# Patient Record
Sex: Female | Born: 1985 | Race: Black or African American | Hispanic: No | Marital: Single | Smoking: Never smoker
Health system: Southern US, Community
[De-identification: ages and names within clinical notes are randomized; demographics above are authoritative.]

## PROBLEM LIST (undated history)

## (undated) DIAGNOSIS — M549 Dorsalgia, unspecified: Secondary | ICD-10-CM

## (undated) DIAGNOSIS — N730 Acute parametritis and pelvic cellulitis: Secondary | ICD-10-CM

## (undated) DIAGNOSIS — K589 Irritable bowel syndrome without diarrhea: Secondary | ICD-10-CM

## (undated) DIAGNOSIS — K5900 Constipation, unspecified: Secondary | ICD-10-CM

## (undated) DIAGNOSIS — Z91018 Allergy to other foods: Secondary | ICD-10-CM

## (undated) DIAGNOSIS — N809 Endometriosis, unspecified: Secondary | ICD-10-CM

## (undated) DIAGNOSIS — J45909 Unspecified asthma, uncomplicated: Secondary | ICD-10-CM

## (undated) DIAGNOSIS — D649 Anemia, unspecified: Secondary | ICD-10-CM

## (undated) HISTORY — DX: Irritable bowel syndrome, unspecified: K58.9

## (undated) HISTORY — DX: Unspecified asthma, uncomplicated: J45.909

## (undated) HISTORY — DX: Anemia, unspecified: D64.9

## (undated) HISTORY — DX: Constipation, unspecified: K59.00

## (undated) HISTORY — DX: Endometriosis, unspecified: N80.9

## (undated) HISTORY — DX: Allergy to other foods: Z91.018

## (undated) HISTORY — DX: Dorsalgia, unspecified: M54.9

---
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
## 2011-02-17 ENCOUNTER — Ambulatory Visit: Payer: Self-pay | Admitting: Women's Health

## 2014-10-16 ENCOUNTER — Encounter (HOSPITAL_COMMUNITY): Payer: Self-pay | Admitting: Emergency Medicine

## 2014-10-16 ENCOUNTER — Emergency Department (INDEPENDENT_AMBULATORY_CARE_PROVIDER_SITE_OTHER)
Admission: EM | Admit: 2014-10-16 | Discharge: 2014-10-16 | Disposition: A | Discharge status: Home / Self Care | Payer: Self-pay | Source: Home / Self Care | Attending: Emergency Medicine | Admitting: Emergency Medicine

## 2014-10-16 ENCOUNTER — Emergency Department (INDEPENDENT_AMBULATORY_CARE_PROVIDER_SITE_OTHER): Payer: Self-pay

## 2014-10-16 DIAGNOSIS — S139XXA Sprain of joints and ligaments of unspecified parts of neck, initial encounter: Secondary | ICD-10-CM

## 2014-10-16 DIAGNOSIS — S239XXA Sprain of unspecified parts of thorax, initial encounter: Secondary | ICD-10-CM

## 2014-10-16 DIAGNOSIS — S63502A Unspecified sprain of left wrist, initial encounter: Secondary | ICD-10-CM

## 2014-10-16 MED ORDER — HYDROCODONE-ACETAMINOPHEN 5-325 MG PO TABS: ORAL_TABLET | ORAL | Status: DC

## 2014-10-16 MED ORDER — DICLOFENAC SODIUM 75 MG PO TBEC: 75.0000 mg | DELAYED_RELEASE_TABLET | Freq: Two times a day (BID) | ORAL | Status: DC

## 2014-10-16 MED ORDER — CYCLOBENZAPRINE HCL 5 MG PO TABS: 5.0000 mg | ORAL_TABLET | Freq: Three times a day (TID) | ORAL | Status: DC | PRN
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
## 2014-10-16 NOTE — Discharge Instructions (Signed)
Do exercises twice daily followed by moist heat for 15 minutes.      Try to be as active as possible.  If no better in 2 weeks, follow up with orthopedist.  TREATMENT  Treatment initially involves the use of ice and medication to help reduce pain and inflammation. It is also important to perform strengthening and stretching exercises and modify activities that worsen symptoms so the injury does not get worse. These exercises may be performed at home or with a therapist. For patients who experience severe symptoms, a soft padded collar may be recommended to be worn around the neck.  Improving your posture may help reduce symptoms. Posture improvement includes pulling your chin and abdomen in while sitting or standing. If you are sitting, sit in a firm chair with your buttocks against the back of the chair. While sleeping, try replacing your pillow with a small towel rolled to 2 inches in diameter, or use a cervical pillow. Poor sleeping positions delay healing.   MEDICATION   If pain medication is necessary, nonsteroidal anti-inflammatory medications, such as aspirin and ibuprofen, or other minor pain relievers, such as acetaminophen, are often recommended.  Do not take pain medication for 7 days before surgery.  Prescription pain relievers may be given if deemed necessary by your caregiver. Use only as directed and only as much as you need.  HEAT AND COLD:   Cold treatment (icing) relieves pain and reduces inflammation. Cold treatment should be applied for 10 to 15 minutes every 2 to 3 hours for inflammation and pain and immediately after any activity that aggravates your symptoms. Use ice packs or an ice massage.  Heat treatment may be used prior to performing the stretching and strengthening activities prescribed by your caregiver, physical therapist, or athletic trainer. Use a heat pack or a warm soak.  SEEK MEDICAL CARE IF:   Symptoms get worse or do not improve in 2 weeks despite  treatment.  New, unexplained symptoms develop (drugs used in treatment may produce side effects).  EXERCISES RANGE OF MOTION (ROM) AND STRETCHING EXERCISES - Cervical Strain and Sprain These exercises may help you when beginning to rehabilitate your injury. In order to successfully resolve your symptoms, you must improve your posture. These exercises are designed to help reduce the forward-head and rounded-shoulder posture which contributes to this condition. Your symptoms may resolve with or without further involvement from your physician, physical therapist or athletic trainer. While completing these exercises, remember:   Restoring tissue flexibility helps normal motion to return to the joints. This allows healthier, less painful movement and activity.  An effective stretch should be held for at least 20 seconds, although you may need to begin with shorter hold times for comfort.  A stretch should never be painful. You should only feel a gentle lengthening or release in the stretched tissue.  STRETCH- Axial Extensors  Lie on your back on the floor. You may bend your knees for comfort. Place a rolled up hand towel or dish towel, about 2 inches in diameter, under the part of your head that makes contact with the floor.  Gently tuck your chin, as if trying to make a "double chin," until you feel a gentle stretch at the base of your head.  Hold _____10_____ seconds. Repeat _____10_____ times. Complete this exercise _____2_____ times per day.   STRETECH - Axial Extension   Stand or sit on a firm surface. Assume a good posture: chest up, shoulders drawn back, abdominal muscles slightly tense,  knees unlocked (if standing) and feet hip width apart.  Slowly retract your chin so your head slides back and your chin slightly lowers.Continue to look straight ahead.  You should feel a gentle stretch in the back of your head. Be certain not to feel an aggressive stretch since this can cause  headaches later.  Hold for ____10______ seconds. Repeat _____10_____ times. Complete this exercise ____2______ times per day.  STRETCH  Cervical Side Bend   Stand or sit on a firm surface. Assume a good posture: chest up, shoulders drawn back, abdominal muscles slightly tense, knees unlocked (if standing) and feet hip width apart.  Without letting your nose or shoulders move, slowly tip your right / left ear to your shoulder until your feel a gentle stretch in the muscles on the opposite side of your neck.  Hold _____10_____ seconds. Repeat _____10_____ times. Complete this exercise _____2_____ times per day.  STRETCH  Cervical Rotators   Stand or sit on a firm surface. Assume a good posture: chest up, shoulders drawn back, abdominal muscles slightly tense, knees unlocked (if standing) and feet hip width apart.  Keeping your eyes level with the ground, slowly turn your head until you feel a gentle stretch along the back and opposite side of your neck.  Hold _____10_____ seconds. Repeat ____10______ times. Complete this exercise ____2______ times per day.  RANGE OF MOTION - Neck Circles   Stand or sit on a firm surface. Assume a good posture: chest up, shoulders drawn back, abdominal muscles slightly tense, knees unlocked (if standing) and feet hip width apart.  Gently roll your head down and around from the back of one shoulder to the back of the other. The motion should never be forced or painful.  Repeat the motion 10-20 times, or until you feel the neck muscles relax and loosen. Repeat ____10______ times. Complete the exercise _____2_____ times per day.  STRENGTHENING EXERCISES - Cervical Strain and Sprain These exercises may help you when beginning to rehabilitate your injury. They may resolve your symptoms with or without further involvement from your physician, physical therapist or athletic trainer. While completing these exercises, remember:   Muscles can gain both the  endurance and the strength needed for everyday activities through controlled exercises.  Complete these exercises as instructed by your physician, physical therapist or athletic trainer. Progress the resistance and repetitions only as guided.  You may experience muscle soreness or fatigue, but the pain or discomfort you are trying to eliminate should never worsen during these exercises. If this pain does worsen, stop and make certain you are following the directions exactly. If the pain is still present after adjustments, discontinue the exercise until you can discuss the trouble with your clinician.  STRENGTH Cervical Flexors, Isometric  Face a wall, standing about 6 inches away. Place a small pillow, a ball about 6-8 inches in diameter, or a folded towel between your forehead and the wall.  Slightly tuck your chin and gently push your forehead into the soft object. Push only with mild to moderate intensity, building up tension gradually. Keep your jaw and forehead relaxed.  Hold 10 to 20 seconds. Keep your breathing relaxed.  Release the tension slowly. Relax your neck muscles completely before you start the next repetition. Repeat _____10_____ times. Complete this exercise _____2_____ times per day.  STRENGTH- Cervical Lateral Flexors, Isometric   Stand about 6 inches away from a wall. Place a small pillow, a ball about 6-8 inches in diameter, or a folded towel  between the side of your head and the wall.  Slightly tuck your chin and gently tilt your head into the soft object. Push only with mild to moderate intensity, building up tension gradually. Keep your jaw and forehead relaxed.  Hold 10 to 20 seconds. Keep your breathing relaxed.  Release the tension slowly. Relax your neck muscles completely before you start the next repetition. Repeat _____10_____ times. Complete this exercise ____2______ times per day.  STRENGTH  Cervical Extensors, Isometric   Stand about 6 inches away from  a wall. Place a small pillow, a ball about 6-8 inches in diameter, or a folded towel between the back of your head and the wall.  Slightly tuck your chin and gently tilt your head back into the soft object. Push only with mild to moderate intensity, building up tension gradually. Keep your jaw and forehead relaxed.  Hold 10 to 20 seconds. Keep your breathing relaxed.  Release the tension slowly. Relax your neck muscles completely before you start the next repetition. Repeat _____10_____ times. Complete this exercise _____2_____ times per day.  POSTURE AND BODY MECHANICS CONSIDERATIONS - Cervical Strain and Sprain Keeping correct posture when sitting, standing or completing your activities will reduce the stress put on different body tissues, allowing injured tissues a chance to heal and limiting painful experiences. The following are general guidelines for improved posture. Your physician or physical therapist will provide you with any instructions specific to your needs. While reading these guidelines, remember:  The exercises prescribed by your provider will help you have the flexibility and strength to maintain correct postures.  The correct posture provides the optimal environment for your joints to work. All of your joints have less wear and tear when properly supported by a spine with good posture. This means you will experience a healthier, less painful body.  Correct posture must be practiced with all of your activities, especially prolonged sitting and standing. Correct posture is as important when doing repetitive low-stress activities (typing) as it is when doing a single heavy-load activity (lifting). PROLONGED STANDING WHILE SLIGHTLY LEANING FORWARD When completing a task that requires you to lean forward while standing in one place for a long time, place either foot up on a stationary 2-4 inch high object to help maintain the best posture. When both feet are on the ground, the low  back tends to lose its slight inward curve. If this curve flattens (or becomes too large), then the back and your other joints will experience too much stress, fatigue more quickly and can cause pain.  RESTING POSITIONS Consider which positions are most painful for you when choosing a resting position. If you have pain with flexion-based activities (sitting, bending, stooping, squatting), choose a position that allows you to rest in a less flexed posture. You would want to avoid curling into a fetal position on your side. If your pain worsens with extension-based activities (prolonged standing, working overhead), avoid resting in an extended position such as sleeping on your stomach. Most people will find more comfort when they rest with their spine in a more neutral position, neither too rounded nor too arched. Lying on a non-sagging bed on your side with a pillow between your knees, or on your back with a pillow under your knees will often provide some relief. Keep in mind, being in any one position for a prolonged period of time, no matter how correct your posture, can still lead to stiffness. WALKING Walk with an upright posture. Your ears, shoulders  and hips should all line-up. OFFICE WORK When working at a desk, create an environment that supports good, upright posture. Without extra support, muscles fatigue and lead to excessive strain on joints and other tissues. CHAIR:  A chair should be able to slide under your desk when your back makes contact with the back of the chair. This allows you to work closely.  The chair's height should allow your eyes to be level with the upper part of your monitor and your hands to be slightly lower than your elbows.  Body position:  Your feet should make contact with the floor. If this is not possible, use a foot rest.  Keep your ears over your shoulders. This will reduce stress on your neck and low back. Document Released: 10/06/2005 Document Revised:  12/29/2011 Document Reviewed: 01/18/2009 [facility] Patient Information 2013 [place].
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
## 2017-07-23 ENCOUNTER — Other Ambulatory Visit: Payer: Self-pay | Admitting: Gastroenterology

## 2017-07-23 DIAGNOSIS — R1032 Left lower quadrant pain: Secondary | ICD-10-CM

## 2017-07-23 NOTE — Progress Notes (Signed)
Gwendolyn Codispoti MD 

## 2017-07-24 ENCOUNTER — Other Ambulatory Visit: Payer: Self-pay

## 2018-07-30 ENCOUNTER — Ambulatory Visit (INDEPENDENT_AMBULATORY_CARE_PROVIDER_SITE_OTHER): Payer: BC Managed Care – PPO

## 2018-07-30 ENCOUNTER — Encounter (HOSPITAL_COMMUNITY): Payer: Self-pay | Admitting: Emergency Medicine

## 2018-07-30 ENCOUNTER — Ambulatory Visit (HOSPITAL_COMMUNITY)
Admission: EM | Admit: 2018-07-30 | Discharge: 2018-07-30 | Disposition: A | Discharge status: Home / Self Care | Payer: BC Managed Care – PPO | Attending: Family Medicine | Admitting: Family Medicine

## 2018-07-30 DIAGNOSIS — S93492A Sprain of other ligament of left ankle, initial encounter: Secondary | ICD-10-CM | POA: Diagnosis not present

## 2018-07-30 DIAGNOSIS — W1842XA Slipping, tripping and stumbling without falling due to stepping into hole or opening, initial encounter: Secondary | ICD-10-CM

## 2018-07-30 DIAGNOSIS — R109 Unspecified abdominal pain: Secondary | ICD-10-CM

## 2018-07-30 DIAGNOSIS — G8929 Other chronic pain: Secondary | ICD-10-CM

## 2018-07-30 DIAGNOSIS — M25572 Pain in left ankle and joints of left foot: Secondary | ICD-10-CM

## 2018-07-30 DIAGNOSIS — M799 Soft tissue disorder, unspecified: Secondary | ICD-10-CM

## 2018-07-30 LAB — POCT URINALYSIS DIP (DEVICE)
BILIRUBIN URINE: NEGATIVE
GLUCOSE, UA: NEGATIVE mg/dL
Hgb urine dipstick: NEGATIVE
KETONES UR: NEGATIVE mg/dL
LEUKOCYTES UA: NEGATIVE
Nitrite: NEGATIVE
PH: 6 (ref 5.0–8.0)
Protein, ur: NEGATIVE mg/dL
Specific Gravity, Urine: >=1.03 (ref 1.005–1.030)
Urobilinogen, UA: 0.2 mg/dL (ref 0.0–1.0)

## 2018-07-30 MED ORDER — DICLOFENAC SODIUM 75 MG PO TBEC: 75.0000 mg | DELAYED_RELEASE_TABLET | Freq: Two times a day (BID) | ORAL | 0 refills | Status: DC

## 2018-07-30 NOTE — ED Triage Notes (Signed)
Pt states she was walking at work and stepped in a pot hole and twisted her L ankle. Pt limping. Pt also feelsl ike she has "a kidney thing" on her R flank.

## 2018-07-30 NOTE — Discharge Instructions (Addendum)
Partial weightbearing tomorrow and advance as tolerated  Follow-up with occupational health at [address] across from the [facility] next Tuesday, no appointment necessary  I think you have had an upper work-up of the flank pain.  It appears to be a muscle strain and may take a few more months to clear up completely.

## 2018-07-30 NOTE — ED Provider Notes (Signed)
[place]    CSN: [id number] Arrival date & time: 07/30/18  6295     History   Chief Complaint Chief Complaint  Patient presents with  . Ankle Pain    HPI Gwendolyn Nguyen is a 32 y.o. female.   Pt states she was walking at work and stepped in a pot hole and twisted her L ankle. Pt limping. Pt also feels like she has "a kidney thing" on her R flank.  She is able to bear some weight but needs to limp quite strikingly.  She has had intermittent sharp pains in her right flank for over a year.  She seen the gynecologist, the chiropractor, and the gastroenterologist said none of them were unable to diagnose anything.      History reviewed. No pertinent past medical history.  There are no active problems to display for this patient.   History reviewed. No pertinent surgical history.  OB History   None      Home Medications    Prior to Admission medications   Medication Sig Start Date End Date Taking? Authorizing Provider  diclofenac (VOLTAREN) 75 MG EC tablet Take 1 tablet (75 mg total) by mouth 2 (two) times daily. 07/30/18   Robyn Haber, MD    Family History No family history on file.  Social History Social History   Tobacco Use  . Smoking status: Never Smoker  Substance Use Topics  . Alcohol use: Yes    Comment: occassional  . Drug use: Not on file     Allergies   Shellfish allergy and Sulfur   Review of Systems Review of Systems  Genitourinary: Positive for flank pain.  Musculoskeletal: Positive for gait problem and joint swelling.  All other systems reviewed and are negative.    Physical Exam Triage Vital Signs ED Triage Vitals  Enc Vitals Group     BP 07/30/18 1851 134/75     Pulse Rate 07/30/18 1851 85     Resp 07/30/18 1851 16     Temp 07/30/18 1851 98.2 F (36.8 C)     Temp src --      SpO2 07/30/18 1851 100 %     Weight --      Height --      Head Circumference --      Peak Flow --      Pain Score  07/30/18 1852 7     Pain Loc --      Pain Edu? --      Excl. in [place]? --    No data found.  Updated Vital Signs BP 134/75   Pulse 85   Temp 98.2 F (36.8 C)   Resp 16   LMP 07/16/2018   SpO2 100%    Physical Exam  Constitutional: She appears well-developed and well-nourished.  HENT:  Right Ear: External ear normal.  Left Ear: External ear normal.  Mouth/Throat: Oropharynx is clear and moist.  Eyes: Conjunctivae are normal.  Neck: Normal range of motion.  Pulmonary/Chest: Effort normal.  Abdominal: Soft.  Patient has some point tenderness underneath the 12th rib on the right  Musculoskeletal:  Antalgic gait  Mild swelling lateral malleolus of left ankle  Tender distal fibula on the left  Skin: Skin is warm and dry.  Psychiatric: She has a normal mood and affect.  Nursing note and vitals reviewed.    UC Treatments / Results  Labs (all labs ordered are listed, but only abnormal results are displayed) Labs Reviewed  POCT  URINALYSIS DIP (DEVICE)    EKG None  Radiology Dg Ankle Complete Left  Result Date: 07/30/2018 CLINICAL DATA:  Initial evaluation for acute injury, twisted ankle. EXAM: LEFT ANKLE COMPLETE - 3+ VIEW COMPARISON:  None. FINDINGS: No acute fracture dislocation. Ankle mortise approximated. Talar dome intact. Diffuse soft tissue swelling about the ankle, most notable at the lateral malleolus. IMPRESSION: 1. No acute osseous abnormality. 2. Diffuse soft tissue swelling about the ankle. Electronically Signed   By: Jeannine Boga M.D.   On: 07/30/2018 19:33    Procedures Procedures (including critical care time)  Medications Ordered in UC Medications - No data to display  Initial Impression / Assessment and Plan / UC Course  I have reviewed the triage vital signs and the nursing notes.  Pertinent labs & imaging results that were available during my care of the patient were reviewed by me and considered in my medical decision making (see chart  for details).     Final Clinical Impressions(s) / UC Diagnoses   Final diagnoses:  Sprain of anterior talofibular ligament of left ankle, initial encounter  Flank pain, chronic     Discharge Instructions     Partial weightbearing tomorrow and advance as tolerated  Follow-up with occupational health at [address] across from the [facility] next Tuesday, no appointment necessary  I think you have had an upper work-up of the flank pain.  It appears to be a muscle strain and may take a few more months to clear up completely.    ED Prescriptions    Medication Sig Dispense Auth. Provider   diclofenac (VOLTAREN) 75 MG EC tablet Take 1 tablet (75 mg total) by mouth 2 (two) times daily. 14 tablet Robyn Haber, MD     Controlled Substance Prescriptions [place] Controlled Substance Registry consulted? Not Applicable   Robyn Haber, MD 07/30/18 1949

## 2018-09-09 ENCOUNTER — Emergency Department (HOSPITAL_COMMUNITY): Payer: BC Managed Care – PPO

## 2018-09-09 ENCOUNTER — Other Ambulatory Visit: Payer: Self-pay

## 2018-09-09 ENCOUNTER — Encounter (HOSPITAL_COMMUNITY): Payer: Self-pay | Admitting: Emergency Medicine

## 2018-09-09 ENCOUNTER — Emergency Department (HOSPITAL_COMMUNITY)
Admission: EM | Admit: 2018-09-09 | Discharge: 2018-09-09 | Disposition: A | Discharge status: Home / Self Care | Payer: BC Managed Care – PPO | Attending: Emergency Medicine | Admitting: Emergency Medicine

## 2018-09-09 DIAGNOSIS — R1030 Lower abdominal pain, unspecified: Secondary | ICD-10-CM | POA: Diagnosis not present

## 2018-09-09 DIAGNOSIS — Z79899 Other long term (current) drug therapy: Secondary | ICD-10-CM | POA: Insufficient documentation

## 2018-09-09 LAB — COMPREHENSIVE METABOLIC PANEL
ALT: 10 U/L (ref 0–44)
AST: 14 U/L — AB (ref 15–41)
Albumin: 3.9 g/dL (ref 3.5–5.0)
Alkaline Phosphatase: 64 U/L (ref 38–126)
Anion gap: 8 (ref 5–15)
BUN: 11 mg/dL (ref 6–20)
CHLORIDE: 106 mmol/L (ref 98–111)
CO2: 24 mmol/L (ref 22–32)
Calcium: 8.9 mg/dL (ref 8.9–10.3)
Creatinine, Ser: 1.01 mg/dL — ABNORMAL HIGH (ref 0.44–1.00)
GFR calc non Af Amer: >60 mL/min (ref >60)
Glucose, Bld: 80 mg/dL (ref 70–99)
Potassium: 3.6 mmol/L (ref 3.5–5.1)
Sodium: 138 mmol/L (ref 135–145)
Total Bilirubin: 0.4 mg/dL (ref 0.3–1.2)
Total Protein: 7.7 g/dL (ref 6.5–8.1)

## 2018-09-09 LAB — CBC WITH DIFFERENTIAL/PLATELET
Abs Immature Granulocytes: 0.02 10*3/uL (ref 0.00–0.07)
Basophils Absolute: 0.1 10*3/uL (ref 0.0–0.1)
Basophils Relative: 1 %
Eosinophils Absolute: 0.3 10*3/uL (ref 0.0–0.5)
Eosinophils Relative: 3 %
HCT: 29.1 % — ABNORMAL LOW (ref 36.0–46.0)
Hemoglobin: 8.1 g/dL — ABNORMAL LOW (ref 12.0–15.0)
Immature Granulocytes: 0 %
Lymphocytes Relative: 24 %
Lymphs Abs: 1.9 10*3/uL (ref 0.7–4.0)
MCH: 19.9 pg — ABNORMAL LOW (ref 26.0–34.0)
MCHC: 27.8 g/dL — ABNORMAL LOW (ref 30.0–36.0)
MCV: 71.3 fL — ABNORMAL LOW (ref 80.0–100.0)
MONOS PCT: 7 %
Monocytes Absolute: 0.5 10*3/uL (ref 0.1–1.0)
NRBC: 0 % (ref 0.0–0.2)
Neutro Abs: 5.2 10*3/uL (ref 1.7–7.7)
Neutrophils Relative %: 65 %
PLATELETS: 324 10*3/uL (ref 150–400)
RBC: 4.08 MIL/uL (ref 3.87–5.11)
RDW: 18.4 % — AB (ref 11.5–15.5)
WBC: 8 10*3/uL (ref 4.0–10.5)

## 2018-09-09 LAB — URINALYSIS, ROUTINE W REFLEX MICROSCOPIC
BILIRUBIN URINE: NEGATIVE
GLUCOSE, UA: NEGATIVE mg/dL
KETONES UR: NEGATIVE mg/dL
LEUKOCYTES UA: NEGATIVE
NITRITE: NEGATIVE
PH: 5 (ref 5.0–8.0)
Protein, ur: NEGATIVE mg/dL
Specific Gravity, Urine: 1.033 — ABNORMAL HIGH (ref 1.005–1.030)

## 2018-09-09 LAB — I-STAT BETA HCG BLOOD, ED (MC, WL, AP ONLY): I-stat hCG, quantitative: <5 m[IU]/mL (ref <5)

## 2018-09-09 MED ORDER — KETOROLAC TROMETHAMINE 15 MG/ML IJ SOLN
15.0000 mg | Freq: Once | INTRAMUSCULAR | Status: AC
  Administered 2018-09-09: 15 mg via INTRAVENOUS
  Filled 2018-09-09: qty 1

## 2018-09-09 MED ORDER — IOHEXOL 300 MG/ML  SOLN
100.0000 mL | Freq: Once | INTRAMUSCULAR | Status: AC | PRN
  Administered 2018-09-09: 100 mL via INTRAVENOUS

## 2018-09-09 NOTE — Discharge Instructions (Signed)
You can take Tylenol or Ibuprofen as directed for pain. You can alternate Tylenol and Ibuprofen every 4 hours. If you take Tylenol at 1pm, then you can take Ibuprofen at 5pm. Then you can take Tylenol again at 9pm.   Follow-up with your GYN doctor for further evaluation.  Return to emergency department for any fever, worsening pain, nausea/vomiting or any other worsening or concerning symptoms.

## 2018-09-09 NOTE — ED Triage Notes (Signed)
Pt c/o RLQ pain onset last pm with slight nausea.  Denies vomiting or diarrhea.  Pt also denies urinary symptoms or vag. Discharge.

## 2018-09-09 NOTE — ED Notes (Signed)
Pt to CT at this time.

## 2018-09-09 NOTE — ED Notes (Signed)
Pt returned from CT, st's pain is better at this time.  Pt's father at bedside

## 2018-09-09 NOTE — ED Provider Notes (Signed)
Care assumed from Blanchie Dessert, MD at shift change with CT abd/pelvis pending.   In brief, this patient is a 32 y.o. F who has been had a year long history of intermittent right lower quadrant pain.  She saw OB/GYN at onset of symptoms with a ultrasound that showed a left cyst.  Initially, her pain has been associated cycles but then started being independent of her cycles.  She saw GI for further evaluation with no conclusive finding.  Patient reports that over last 2 days, her pain is come back.  No alleviating or aggravating factors.  Patient reports that OB/GYN had talked about further evaluation with laparotomy but that they wanted her to have a CT scan first that she had never been evaluated for CT scan.  See note from previous provider for full history/physical.  PLAN: Patient is pending CT.  Her hemoglobin is slightly low. No priors for comparison.  Patient is currently on her menstrual cycle.  If CT is unremarkable, plan to follow-up with OB/GYN.  No indication for pelvic exam.   MDM: CT scan shows a cyst noted in both ovaries.  There is some enhancement associated with 1 of the follicles in the right ovary that could possibly hemorrhagic follicle.  Additionally, there is some fluid noted to the inferior right ovary.  Appendix is normal.  No evidence of any acute other abnormalities.  Discussed results with patient.  Patient is sitting comfortably on the bed without any signs of distress.  She reports her pain is slightly improved.  Patient has some mild tenderness noted in her right lower quadrant, suprapubic region on abdominal exam but is overall reassuring.  I discussed findings of CT scan with patient.  Patient currently not having any pelvic complaints.  Given that his pain has been intermittently ongoing for last year, do not suspect ovarian torsion pain.  Patient is hemodynamically stable here in the ED.  Discussed conservative therapies with patient.  Instructed patient to follow-up  with her primary care doctor and OB/GYN next 2 to 4 days for further evaluation. Patient had ample opportunity for questions and discussion. All patient's questions were answered with full understanding. Strict return precautions discussed. Patient expresses understanding and agreement to plan.    1. Lower abdominal pain        Desma Mcgregor 09/10/18 Johnston Ebbs, MD 09/11/18 0009

## 2018-09-09 NOTE — ED Provider Notes (Signed)
[facility] Provider Note   CSN: [id number] Arrival date & time: 09/09/18  1627     History   Chief Complaint Chief Complaint  Patient presents with  . Abdominal Pain    HPI Gwendolyn Nguyen is a 32 y.o. female.  Patient is a 32 year old healthy female with no significant medical history presenting with right lower quadrant pain that is been significant in the last 2 days.  She states when it comes its 10 out of 10 but it seems to come in waves.  She states for the last 1 year she has had this pain intermittently usually about every month and her physicians have not been able to discover the cause of the symptoms.  She was seeing OB/GYN and they were thinking this may be related to endometriosis as initially it seemed to be consistent with her menstrual cycles but she states now it is not.  She has undergone ultrasounds which were within normal limits.  She states the last step would be for her to have a exploratory laparotomy to evaluate if that is what causes her pain however when she was talking with her doctors they recommended she come for a CAT scan to ensure there is nothing else going on.  The history is provided by the patient.  Abdominal Pain   This is a recurrent problem. The current episode started 2 days ago. Episode frequency: waxing and waning but has been happening intermittently for the last year. The problem has not changed since onset.The pain is associated with an unknown factor. The pain is located in the RLQ. The quality of the pain is aching, cramping, sharp and shooting. The pain is at a severity of 8/10. The pain is severe. Pertinent negatives include anorexia, fever, diarrhea, flatus, nausea, vomiting, constipation, dysuria and frequency. Nothing aggravates the symptoms. Nothing relieves the symptoms. Past workup includes GI consult and ultrasound. Past workup comments: ob evaluation.Marland Kitchen    History reviewed. No pertinent past  medical history.  There are no active problems to display for this patient.   History reviewed. No pertinent surgical history.   OB History   None      Home Medications    Prior to Admission medications   Medication Sig Start Date End Date Taking? Authorizing Provider  diclofenac (VOLTAREN) 75 MG EC tablet Take 1 tablet (75 mg total) by mouth 2 (two) times daily. 07/30/18   Robyn Haber, MD    Family History No family history on file.  Social History Social History   Tobacco Use  . Smoking status: Never Smoker  . Smokeless tobacco: Never Used  Substance Use Topics  . Alcohol use: Yes    Comment: occassional  . Drug use: Not on file     Allergies   Shellfish allergy and Sulfur   Review of Systems Review of Systems  Constitutional: Negative for fever.  Gastrointestinal: Positive for abdominal pain. Negative for anorexia, constipation, diarrhea, flatus, nausea and vomiting.  Genitourinary: Negative for dysuria and frequency.  All other systems reviewed and are negative.    Physical Exam Updated Vital Signs BP (!) 152/105 (BP Location: Right Arm)   Pulse (!) 108   Temp 97.9 F (36.6 C) (Oral)   Resp 18   Ht 5\' 4"  (1.626 m)   Wt 83.9 kg   LMP 09/09/2018 (Exact Date)   SpO2 100%   BMI 31.76 kg/m   Physical Exam  Constitutional: She is oriented to person, place, and time. She  appears well-developed and well-nourished. No distress.  HENT:  Head: Normocephalic and atraumatic.  Mouth/Throat: Oropharynx is clear and moist.  Eyes: Pupils are equal, round, and reactive to light. Conjunctivae and EOM are normal.  Neck: Normal range of motion. Neck supple.  Cardiovascular: Normal rate, regular rhythm and intact distal pulses.  No murmur heard. Pulmonary/Chest: Effort normal and breath sounds normal. No respiratory distress. She has no wheezes. She has no rales.  Abdominal: Soft. She exhibits no distension. There is tenderness in the right lower quadrant.  There is guarding. There is no rebound and no CVA tenderness.  Musculoskeletal: Normal range of motion. She exhibits no edema or tenderness.  Neurological: She is alert and oriented to person, place, and time.  Skin: Skin is warm and dry. Capillary refill takes less than 2 seconds. No rash noted. No erythema.  Psychiatric: She has a normal mood and affect. Her behavior is normal.  Nursing note and vitals reviewed.    ED Treatments / Results  Labs (all labs ordered are listed, but only abnormal results are displayed) Labs Reviewed  CBC WITH DIFFERENTIAL/PLATELET  COMPREHENSIVE METABOLIC PANEL  URINALYSIS, ROUTINE W REFLEX MICROSCOPIC  I-STAT BETA HCG BLOOD, ED ([facility], [facility], [facility] ONLY)    EKG None  Radiology No results found.  Procedures Procedures (including critical care time)  Medications Ordered in ED Medications - No data to display   Initial Impression / Assessment and Plan / ED Course  I have reviewed the triage vital signs and the nursing notes.  Pertinent labs & imaging results that were available during my care of the patient were reviewed by me and considered in my medical decision making (see chart for details).     Patient is a healthy 32 year old female with intermittent right lower quadrant pain for the last year.  It started 2 days ago and it is become exceedingly worse.  She has had work-up by GI and OB/GYN without specific cause of her pain.  She has not undergone exploratory laparotomy but has done ultrasound with her OB/GYN.  It does not seem to be related to her menstrual cycle however she is on her menses currently.  Patient has pain and guarding in the right lower quadrant.  She denies any urinary or vaginal symptoms.  She has never had a CT in the past so we will do a CT to rule out any other acute cause for the pain.  Final Clinical Impressions(s) / ED Diagnoses   Final diagnoses:  None    ED Discharge Orders    None       Blanchie Dessert,  MD 09/10/18 1106

## 2018-10-28 ENCOUNTER — Encounter: Payer: Self-pay | Admitting: Family Medicine

## 2018-11-09 ENCOUNTER — Other Ambulatory Visit: Payer: Self-pay | Admitting: Obstetrics and Gynecology

## 2018-11-11 ENCOUNTER — Other Ambulatory Visit: Payer: Self-pay | Admitting: Obstetrics and Gynecology

## 2018-11-11 DIAGNOSIS — N9489 Other specified conditions associated with female genital organs and menstrual cycle: Secondary | ICD-10-CM

## 2018-11-18 ENCOUNTER — Encounter (INDEPENDENT_AMBULATORY_CARE_PROVIDER_SITE_OTHER): Payer: Self-pay

## 2018-11-25 ENCOUNTER — Ambulatory Visit (INDEPENDENT_AMBULATORY_CARE_PROVIDER_SITE_OTHER): Payer: BC Managed Care – PPO | Admitting: Bariatrics

## 2018-11-25 ENCOUNTER — Encounter (INDEPENDENT_AMBULATORY_CARE_PROVIDER_SITE_OTHER): Payer: Self-pay | Admitting: Bariatrics

## 2018-11-25 VITALS — BP 124/82 | HR 62 | Ht 64.0 in | Wt 180.0 lb

## 2018-11-25 DIAGNOSIS — R0602 Shortness of breath: Secondary | ICD-10-CM | POA: Diagnosis not present

## 2018-11-25 DIAGNOSIS — E669 Obesity, unspecified: Secondary | ICD-10-CM

## 2018-11-25 DIAGNOSIS — F3289 Other specified depressive episodes: Secondary | ICD-10-CM | POA: Diagnosis not present

## 2018-11-25 DIAGNOSIS — R5383 Other fatigue: Secondary | ICD-10-CM | POA: Diagnosis not present

## 2018-11-25 DIAGNOSIS — Z6830 Body mass index (BMI) 30.0-30.9, adult: Secondary | ICD-10-CM

## 2018-11-25 DIAGNOSIS — Z9189 Other specified personal risk factors, not elsewhere classified: Secondary | ICD-10-CM

## 2018-11-25 DIAGNOSIS — E559 Vitamin D deficiency, unspecified: Secondary | ICD-10-CM

## 2018-11-25 DIAGNOSIS — Z0289 Encounter for other administrative examinations: Secondary | ICD-10-CM

## 2018-11-25 DIAGNOSIS — R7309 Other abnormal glucose: Secondary | ICD-10-CM | POA: Diagnosis not present

## 2018-11-25 DIAGNOSIS — Z862 Personal history of diseases of the blood and blood-forming organs and certain disorders involving the immune mechanism: Secondary | ICD-10-CM

## 2018-11-25 NOTE — Progress Notes (Signed)
.  Office: [phone]  /  Fax: [phone]   HPI:   Chief Complaint: OBESITY  Gwendolyn Nguyen (MR# [id number]) is a 33 y.o. female who presents on 11/25/2018 for obesity evaluation and treatment. Current BMI is Body mass index is 30.9 kg/m.Gwendolyn Nguyen Gwendolyn Nguyen has struggled with obesity for years and has been unsuccessful in either losing weight or maintaining long term weight loss. Gwendolyn Nguyen attended our information session and states she is currently in the action stage of change and ready to dedicate time achieving and maintaining a healthier weight.  Gwendolyn Nguyen states she struggles with family and or coworkers weight loss sabotage her desired weight loss is 15 lbs. she has been heavy most of her life she started gaining weight in college her heaviest weight ever was 205 lbs. she has significant food cravings for pasta and cheese she snacks frequently in the evenings she skips meals frequently she is frequently drinking liquids with calories she frequently makes poor food choices she frequently eats larger portions than normal  she has binge eating behaviors she struggles with emotional eating  she does not like to cook (bad stove) she considers her worst habit to be eating right before going to bed   Fatigue Gwendolyn Nguyen feels her energy is lower than it should be. This has worsened with weight gain and has not worsened recently. Gwendolyn Nguyen admits to daytime somnolence and she admits to waking up still tired. Patient is at risk for obstructive sleep apnea. Patent has a history of symptoms of daytime fatigue and morning fatigue. Patient generally gets 7 hours of sleep per night, and states they generally have restless sleep. Snoring is present. Apneic episodes are not present. Epworth Sleepiness Score is 8  Dyspnea on exertion [name] notes increasing shortness of breath with certain activities (generalized fatigue and with activity) and seems to be worsening over time with weight gain. She notes getting  out of breath sooner with activity than she used to. This has not gotten worse recently. Gwendolyn Nguyen denies orthopnea.  History of Anemia Gwendolyn Nguyen has a history of anemia. Her last CBC was on 10/28/18. Her hemoglobin was at 9.2 and hematocrit was at 27.6  She is on iron supplementation. Gwendolyn Nguyen has possible endometriosis. She has heavy menstrual periods lasting 8+ days.   Elevated Glucose Gwendolyn Nguyen has a history of some elevated blood glucose readings without a diagnosis of diabetes. She denies polyphagia. Her last A1c was at 5.2 on 10/28/18.  Vitamin D deficiency Gwendolyn Nguyen has a diagnosis of vitamin D deficiency. She is not currently taking vit D and denies nausea, vomiting or muscle weakness.  At risk for osteopenia and osteoporosis Gwendolyn Nguyen is at higher risk of osteopenia and osteoporosis due to vitamin D deficiency.   Depression with emotional eating behaviors Gwendolyn Nguyen is struggling with emotional eating and using food for comfort to the extent that it is negatively impacting her health. She often snacks when she is not hungry. Gwendolyn Nguyen sometimes feels she is out of control and then feels guilty that she made poor food choices. She has been working on behavior modification techniques to help reduce her emotional eating. She shows no sign of suicidal or homicidal ideations.  Depression Screen Gwendolyn Nguyen's Food and Mood (modified PHQ-9) score was  Depression screen PHQ 2/9 11/25/2018  Decreased Interest 2  Down, Depressed, Hopeless 1  PHQ - 2 Score 3  Altered sleeping 1  Tired, decreased energy 2  Change in appetite 3  Feeling bad or failure about yourself  1  Trouble concentrating 1  Moving slowly or fidgety/restless 0  Suicidal thoughts 0  PHQ-9 Score 11  Difficult doing work/chores Not difficult at all    ASSESSMENT AND PLAN:  Other fatigue - Plan: EKG 12-Lead, VITAMIN D 25 Hydroxy (Vit-D Deficiency, Fractures), T3, T4, free, TSH  Shortness of breath on exertion - Plan: Lipid Panel With LDL/HDL  Ratio  Vitamin D deficiency  Elevated glucose - Plan: Insulin, random  History of anemia  Other depression - with emotional eating  At risk for osteoporosis  Class 1 obesity with serious comorbidity and body mass index (BMI) of 30.0 to 30.9 in adult, unspecified obesity type  PLAN:  Fatigue Gwendolyn Nguyen was informed that her fatigue may be related to obesity, depression or many other causes. Labs will be ordered, and in the meanwhile Gwendolyn Nguyen has agreed to work on diet, exercise and weight loss to help with fatigue. Proper sleep hygiene was discussed including the need for 7-8 hours of quality sleep each night. A sleep study was not ordered based on symptoms and Epworth score.  Dyspnea on exertion Gwendolyn Nguyen's shortness of breath appears to be obesity related and exercise induced. She has agreed to work on weight loss and gradually increase exercise to treat her exercise induced shortness of breath. If Gwendolyn Nguyen follows our instructions and loses weight without improvement of her shortness of breath, we will plan to refer to pulmonology. We will monitor this condition regularly. Gwendolyn Nguyen agrees to this plan.  History of Anemia The diagnosis of Iron deficiency anemia was discussed with Gwendolyn Nguyen and was explained in detail. She was given suggestions of iron rich foods and iron supplement was not prescribed. [name] will follow up with her OB/GYN and she will continue her iron supplementation.  Elevated Glucose Fasting labs will be obtained (insulin level) and results with be discussed with [facility] in 2 weeks at her follow up visit. In the meanwhile Gwendolyn Nguyen was started on a lower simple carbohydrate diet and will work on weight loss efforts.  Vitamin D Deficiency Gwendolyn Nguyen was informed that low vitamin D levels contributes to fatigue and are associated with obesity, breast, and colon cancer. We will check vitamin D level and she will follow up for routine testing of vitamin D, at least 2-3 times per  year.   At risk for osteopenia and osteoporosis Gwendolyn Nguyen was given extended  (15 minutes) osteoporosis prevention counseling today. Gwendolyn Nguyen is at risk for osteopenia and osteoporosis due to her vitamin D deficiency. She was encouraged to take her vitamin D and follow her higher calcium diet and increase strengthening exercise to help strengthen her bones and decrease her risk of osteopenia and osteoporosis.  Depression with Emotional Eating Behaviors We discussed behavior modification techniques today to help Aerica deal with her emotional eating and depression. We will refer Deneisha to Dr. Mallie Mussel our bariatric psychologist.  Depression Screen Ellison Hughs had a moderately positive depression screening. Depression is commonly associated with obesity and often results in emotional eating behaviors. We will monitor this closely and work on CBT to help improve the non-hunger eating patterns.   Obesity Assata is currently in the action stage of change and her goal is to continue with weight loss efforts She has agreed to follow the Category 2 plan +100 calories for snacks Donyel has been instructed to work up to a goal of 150 minutes of combined cardio and strengthening exercise per week for weight loss and overall health benefits. We discussed the following Behavioral Modification Strategies today: increase H2O intake, keeping healthy foods in the  home, increasing lean protein intake, decreasing simple carbohydrates, increasing vegetables and work on meal planning and easy cooking plans  Queena has agreed to follow up with our clinic in 2 weeks. She was informed of the importance of frequent follow up visits to maximize her success with intensive lifestyle modifications for her multiple health conditions. She was informed we would discuss her lab results at her next visit unless there is a critical issue that needs to be addressed sooner. Mitsue agreed to keep her next visit at the agreed upon time to  discuss these results.  ALLERGIES: Allergies  Allergen Reactions  . Shellfish Allergy Hives  . Sulfa Antibiotics Other (See Comments)    Patient was told to NOT TAKE THIS    MEDICATIONS: Current Outpatient Medications on File Prior to Visit  Medication Sig Dispense Refill  . Black Currant Seed Oil 500 MG CAPS Take 1 capsule by mouth daily.    . ferrous sulfate 325 (65 FE) MG tablet Take 325 mg by mouth daily with breakfast.    . Peppermint Oil (IBGARD PO) Take 1 capsule by mouth daily.    . vitamin E 1000 UNIT capsule Take 1,000 Units by mouth daily.     No current facility-administered medications on file prior to visit.     PAST MEDICAL HISTORY: Past Medical History:  Diagnosis Date  . Anemia   . Back pain   . Constipation   . Endometriosis   . IBS (irritable bowel syndrome)   . Low hemoglobin   . Multiple food allergies     PAST SURGICAL HISTORY: History reviewed. No pertinent surgical history.  SOCIAL HISTORY: Social History   Tobacco Use  . Smoking status: Never Smoker  . Smokeless tobacco: Never Used  Substance Use Topics  . Alcohol use: Yes    Comment: occassional  . Drug use: Not on file    FAMILY HISTORY: Family History  Problem Relation Age of Onset  . Hyperlipidemia Mother   . Hypertension Mother   . Obesity Mother   . Hyperlipidemia Father   . Hypertension Father   . Obesity Father     ROS: Review of Systems  Constitutional: Positive for malaise/fatigue.       Fever/Chills  HENT: Positive for congestion (nasal stuffiness), sinus pain and tinnitus.        + Dry Mouth + Hoarseness  Eyes:       + Vision Changes + Wear Glasses or Contacts + Floaters  Respiratory: Positive for shortness of breath.   Cardiovascular: Negative for orthopnea.       + Shortness of Breath on exertion + Leg Cramping + Very Cold Feet or Hands  Gastrointestinal: Positive for constipation and diarrhea. Negative for nausea and vomiting.  Genitourinary: Positive  for frequency.  Musculoskeletal: Positive for back pain.       Negative for muscle weakness + Swollen Glands + Muscle or Joint Pain  Skin: Positive for rash.       + Hair or Nail Changes  Neurological: Positive for headaches.  Endo/Heme/Allergies: Bruises/bleeds easily (bruising).       Negative polyphagia  Psychiatric/Behavioral: Positive for depression. Negative for suicidal ideas. The patient has insomnia.     PHYSICAL EXAM: Blood pressure 124/82, pulse 62, height 5\' 4"  (1.626 m), weight 180 lb (81.6 kg), last menstrual period 11/03/2018, SpO2 100 %. Body mass index is 30.9 kg/m. Physical Exam Vitals signs reviewed.  Constitutional:      Appearance: Normal appearance. She is well-developed. She  is obese.  HENT:     Head: Normocephalic and atraumatic.     Nose: Nose normal.     Mouth/Throat:     Comments: Mallampati= 1 Eyes:     General: No scleral icterus.    Extraocular Movements: Extraocular movements intact.  Neck:     Musculoskeletal: Normal range of motion and neck supple.     Comments: Thyroid-large normal Cardiovascular:     Rate and Rhythm: Normal rate and regular rhythm.  Pulmonary:     Effort: Pulmonary effort is normal. No respiratory distress.  Abdominal:     Palpations: Abdomen is soft.     Tenderness: There is no abdominal tenderness.  Musculoskeletal: Normal range of motion.     Comments: Range of Motion normal in all 4 extremities  Skin:    General: Skin is warm and dry.  Neurological:     Mental Status: She is alert and oriented to person, place, and time.     Coordination: Coordination normal.  Psychiatric:        Mood and Affect: Mood normal.        Behavior: Behavior normal.        Thought Content: Thought content does not include homicidal or suicidal ideation.     RECENT LABS AND TESTS: BMET    Component Value Date/Time   NA 138 09/09/2018 1720   K 3.6 09/09/2018 1720   CL 106 09/09/2018 1720   CO2 24 09/09/2018 1720   GLUCOSE 80  09/09/2018 1720   BUN 11 09/09/2018 1720   CREATININE 1.01 (H) 09/09/2018 1720   CALCIUM 8.9 09/09/2018 1720   GFRNONAA >60 09/09/2018 1720   GFRAA >60 09/09/2018 1720   No results found for: HGBA1C No results found for: INSULIN CBC    Component Value Date/Time   WBC 8.0 09/09/2018 1720   RBC 4.08 09/09/2018 1720   HGB 8.1 (L) 09/09/2018 1720   HCT 29.1 (L) 09/09/2018 1720   PLT 324 09/09/2018 1720   MCV 71.3 (L) 09/09/2018 1720   MCH 19.9 (L) 09/09/2018 1720   MCHC 27.8 (L) 09/09/2018 1720   RDW 18.4 (H) 09/09/2018 1720   LYMPHSABS 1.9 09/09/2018 1720   MONOABS 0.5 09/09/2018 1720   EOSABS 0.3 09/09/2018 1720   BASOSABS 0.1 09/09/2018 1720   Iron/TIBC/Ferritin/ %Sat No results found for: IRON, TIBC, FERRITIN, IRONPCTSAT Lipid Panel  No results found for: CHOL, TRIG, HDL, CHOLHDL, VLDL, LDLCALC, LDLDIRECT Hepatic Function Panel     Component Value Date/Time   PROT 7.7 09/09/2018 1720   ALBUMIN 3.9 09/09/2018 1720   AST 14 (L) 09/09/2018 1720   ALT 10 09/09/2018 1720   ALKPHOS 64 09/09/2018 1720   BILITOT 0.4 09/09/2018 1720   No results found for: TSH Vitamin D There are no recent lab results  ECG  shows NSR with a rate of 64 BPM INDIRECT CALORIMETER done today shows a VO2 of 268 and a REE of 1863. Her calculated basal metabolic rate is 4431 thus her basal metabolic rate is better than expected.       OBESITY BEHAVIORAL INTERVENTION VISIT  Today's visit was # 1   Starting weight: 180 lbs Starting date: 11/25/2018 Today's weight : 180 lbs Today's date: 11/25/2018 Total lbs lost to date: 0   ASK: We discussed the diagnosis of obesity with [name] today and Ellison Hughs agreed to give [place] permission to discuss obesity behavioral modification therapy today.  ASSESS: Ashiya has the diagnosis of obesity and her BMI today  is 4.88 Mariona is in the action stage of change   ADVISE: Laquanda was educated on the multiple health risks of obesity as well as  the benefit of weight loss to improve her health. She was advised of the need for long term treatment and the importance of lifestyle modifications to improve her current health and to decrease her risk of future health problems.  AGREE: Multiple dietary modification options and treatment options were discussed and  Milessa agreed to follow the recommendations documented in the above note.  ARRANGE: Laurelyn was educated on the importance of frequent visits to treat obesity as outlined per CMS and USPSTF guidelines and agreed to schedule her next follow up appointment today.   Corey Skains, am acting as Location manager for [employer]. Owens Shark, DO  I have reviewed the above documentation for accuracy and completeness, and I agree with the above. -Jearld Lesch, DO

## 2018-11-26 LAB — TSH: TSH: 1.16 u[IU]/mL (ref 0.450–4.500)

## 2018-11-26 LAB — LIPID PANEL WITH LDL/HDL RATIO
Cholesterol, Total: 150 mg/dL (ref 100–199)
HDL: 56 mg/dL (ref >39)
LDL Calculated: 86 mg/dL (ref 0–99)
LDL/HDL RATIO: 1.5 ratio (ref 0.0–3.2)
TRIGLYCERIDES: 41 mg/dL (ref 0–149)
VLDL Cholesterol Cal: 8 mg/dL (ref 5–40)

## 2018-11-26 LAB — T3: T3, Total: 117 ng/dL (ref 71–180)

## 2018-11-26 LAB — VITAMIN D 25 HYDROXY (VIT D DEFICIENCY, FRACTURES): Vit D, 25-Hydroxy: 45.4 ng/mL (ref 30.0–100.0)

## 2018-11-26 LAB — INSULIN, RANDOM: INSULIN: 9 u[IU]/mL (ref 2.6–24.9)

## 2018-11-26 LAB — T4, FREE: Free T4: 1.38 ng/dL (ref 0.82–1.77)

## 2018-11-29 DIAGNOSIS — E669 Obesity, unspecified: Secondary | ICD-10-CM | POA: Insufficient documentation

## 2018-11-29 DIAGNOSIS — R7309 Other abnormal glucose: Secondary | ICD-10-CM | POA: Insufficient documentation

## 2018-11-29 DIAGNOSIS — Z6830 Body mass index (BMI) 30.0-30.9, adult: Secondary | ICD-10-CM | POA: Insufficient documentation

## 2018-12-02 ENCOUNTER — Other Ambulatory Visit: Payer: BC Managed Care – PPO

## 2018-12-02 ENCOUNTER — Encounter: Payer: Self-pay | Admitting: Diagnostic Radiology

## 2018-12-02 ENCOUNTER — Ambulatory Visit
Admission: RE | Admit: 2018-12-02 | Discharge: 2018-12-02 | Disposition: A | Discharge status: Home / Self Care | Payer: BC Managed Care – PPO | Source: Ambulatory Visit | Attending: Obstetrics and Gynecology | Admitting: Obstetrics and Gynecology

## 2018-12-02 DIAGNOSIS — N9489 Other specified conditions associated with female genital organs and menstrual cycle: Secondary | ICD-10-CM

## 2018-12-02 MED ORDER — GADOBENATE DIMEGLUMINE 529 MG/ML IV SOLN
17.0000 mL | Freq: Once | INTRAVENOUS | Status: AC | PRN
  Administered 2018-12-02: 7 mL via INTRAVENOUS

## 2018-12-02 NOTE — Progress Notes (Signed)
Called to see patient at [facility] ([facility] Patient had injection of 7 cc Multihance for pelvic MRI. Patient vomited half way through the exam but completed the exam. 20 minutes after injection of contrast, patient began developing hives, some itchy and others not. Hives on left forearm, back, neck, and face. Denied SOB, tongue/mouth swelling, or other symptoms.  No h/o asthma.  Does have other allergies. Patient was given 25 mg Benadryl po.  Over the next 1/2 hour, some hives improved and a few new ones appeared. Prior to d/c, patient is calm, comfortable, and reports mild itching.  Given instructions for f/u. Patient given my pager number isf there are any concerns.  Told to call 911 if there is SOB. Patient understands. Discharged to home.  Has a ride.

## 2018-12-13 NOTE — Progress Notes (Signed)
Office: [phone]  /  Fax: [phone]    Date: December 14, 2018  Time Seen: 10:03am Duration: 42 minutes Provider: Glennie Isle, PsyD Type of Session: Intake for Individual Therapy  Type of Contact: Face-to-face  Informed Consent:The provider's role was explained to [facility]. The provider reviewed and discussed issues of confidentiality, privacy, and limits therein. In addition to verbal informed consent, written informed consent for psychological services was obtained from Mullen prior to the initial intake interview. Written consent included information concerning the practice, financial arrangements, and confidentiality and patients' rights. Since the clinic is not a 24/7 crisis center, mental health emergency resources were shared in the form of a handout, and the provider explained MyChart, e-mail, voicemail, and/or other messaging systems should be utilized only for non-emergency reasons. Para verbally acknowledged understanding of the aforementioned, and agreed to use mental health emergency resources discussed if needed. Moreover, Iracema agreed information may be shared with other [facility] providers as needed for coordination of care, and written consent was obtained.   Chief Complaint: Swathi was referred by Dr. Jearld Lesch due to depression with emotional eating behaviors. Per the note for the visit with Dr. Jearld Lesch on November 25, 2018, "Etoy is struggling with emotional eating and using food for comfort to the extent that it is negatively impacting her health. She often snacks when she is not hungry. Lorely sometimes feels she is out of control and then feels guilty that she made poor food choices. She has been working on behavior modification techniques to help reduce her emotional eating. She shows no sign of suicidal or homicidal ideations."  During today's appointment, Ellieana reported, "I've been struggling health wise." She noted  the last episode of emotional eating was this morning. She explained she had labs done for cancer screening resulting in her eating a McGriddle. Areebah reported "binging" when she has not had time to eat all day; therefore, she will eat to "compensate for the day." She noted her last episode of the aforementioned was approximately one month ago. In addition, Eritrea reported, "I over eat a lot, but I think that was how I was raised." She shared she tends to crave sweets, such as cookies.   Bekka was asked to complete a questionnaire assessing various behaviors related to emotional eating. Josie endorsed the following: overeat when you are celebrating, experience food cravings on a regular basis, eat certain foods when you are anxious, stressed, depressed, or your feelings are hurt, use food to help you cope with emotional situations, find food is comforting to you, overeat when you are angry or upset, overeat when you are worried about something, overeat frequently when you are bored or lonely, not worry about what you eat when you are in a good mood, eat to help you stay awake and eat as a reward.  HPI: Per the note for the initial visit with Dr. Jearld Lesch on November 25, 2018, Ellison Hughs shared she has significant food cravings for pasta and cheese. During the initial appointment with Dr. Jearld Lesch, Ellison Hughs further reported experiencing the following: snacking frequently in the evenings, frequently drinking liquids with calories, frequently making poor food choices, frequently eating larger portions than normal , binge eating behaviors and struggling with emotional eating. She also noted she skips meals frequently, does not like to cook, and considers her worst habit to be eating right before going to bed. During today's appointment, Latish reported the onset of emotional eating was likely in high school  due to "fluctuations in health." She noted binge eating and over eating has probably been "all" her  life. Gabrelle noted an increase in binge eating since starting her current job in 2016. She denied a history of purging and engagement in other compensatory strategies, and she has never been diagnosed with an eating disorder.   Mental Status Examination: Cartha arrived on time for the appointment. She presented as appropriately dressed and groomed. Sidonia appeared her stated age and demonstrated adequate orientation to time, place, person, and purpose of the appointment. She also demonstrated appropriate eye contact. No psychomotor abnormalities or behavioral peculiarities noted. Her mood was euthymic with congruent affect. Her thought processes were logical, linear, and goal-directed. No hallucinations, delusions, bizarre thinking or behavior reported or observed. Judgment, insight, and impulse control appeared to be grossly intact. There was no evidence of paraphasias (i.e., errors in speech, gross mispronunciations, and word substitutions), repetition deficits, or disturbances in volume or prosody (i.e., rhythm and intonation). There was no evidence of attention or memory impairments. Myleigh denied current suicidal and homicidal ideation, plan, and intent.   Tattianna reported attention and concentration concerns as well as "memory lapses." Thus, the Mini-Mental State Examination, Second Edition (MMSE-2) was administered. The MMSE-2 briefly screens for cognitive dysfunction and overall mental status and assesses different cognitive domains: orientation, registration, attention and calculation, recall, and language and praxis. Keslie received 30 out of 30 points possible on the MMSE-2, which is noted in the normal range.   Family & Psychosocial History: Trinna shared she is not currently in a relationship and has never been married. She does not have any children. 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 She shared her highest level of education is a master's degree.  Velena noted her social support system consists of her co-counselor, peers within the field, family, and friends. She stated she identifies with Christianity, and attends church every Thursday and Sunday.   Medical History:  Past Medical History:  Diagnosis Date  . Anemia   . Back pain   . Constipation   . Endometriosis   . IBS (irritable bowel syndrome)   . Low hemoglobin   . Multiple food allergies    No past surgical history on file. Current Outpatient Medications on File Prior to Visit  Medication Sig Dispense Refill  . Black Currant Seed Oil 500 MG CAPS Take 1 capsule by mouth daily.    . ferrous sulfate 325 (65 FE) MG tablet Take 325 mg by mouth daily with breakfast.    . Peppermint Oil (IBGARD PO) Take 1 capsule by mouth daily.    . vitamin E 1000 UNIT capsule Take 1,000 Units by mouth daily.     No current facility-administered medications on file prior to visit.   Conny shared she recently found out she is allergic to multihance when she had an MRI to check for endometriosis. Renda denied a history of head injuries and loss of consciousness.    Mental Health History: Ingri denied a history of therapeutic and psychiatric services. She has never been prescribed psychotropic medications and denied ever being hospitalized for psychiatric concerns. Regarding family history for mental health concerns, her maternal great uncle suffered from PTSD. Torian denied a trauma history, including psychological, physical  and sexual abuse, as well as neglect.   Jacquelina shared due to pain she has not been able to enjoy things she would normally enjoy. She stated her mood is typically "peppy and energetic." She reported  experiencing hopelessness due to multiple doctor appointments and "people not knowing what is going on." She explained she has always had "heavy menstrual cycles" and recently has had "adverse reactions to medications." She added, "We are now on the hunt to find out if I have  endometriosis and possible IBS." She noted she is "finally getting answers," which is helping her "feel a little bit better." She added, "But it took years to get to this point." [place] further reported difficulty staying and falling asleep; however, she noted she tries to average 8 hours a night. She also endorsed experiencing the following: fatigue; overeating; and fidgeting. She further noted experiencing crying spells when experiencing pain or being worried about her health. Moreover, Anicia reported experiencing attention and concentration issues. She attributed it to her health concerns. Chieko noted, "I'll have memory lapses." For example, she forgets password. Furthermore, she disclosed a history of panic attacks, and noted the last one was approximately two months ago. She noted her panic attacks are characterized by difficulty breathing, feeling hopeless, and feeling overwhelmed. She denied a history of illicit and recreational substance use and noted she consumes alcohol socially.  Aletta denied experiencing the following: self-esteem related concerns; irritability; angry outbursts; hallucinations and delusions; obsessions and compulsions; mania; and social withdrawal. She also denied history of and current suicidal ideation, plan, and intent; history of and current homicidal ideation, plan, and intent; and history of and current engagement in self-harm.  The following strengths were reported by [facility]: patient, hardworking, good listener, caring, and volunteer. The following strengths were observed by this provider: ability to express thoughts and feelings during the therapeutic session, ability to establish and benefit from a therapeutic relationship, ability to learn and practice coping skills, willingness to work toward established goal(s) with the clinic and ability to engage in reciprocal conversation.  Legal History: Kyna denied a history of legal involvement.   Structured  Assessment Results: The Patient Health Questionnaire-9 (PHQ-9) is a self-report measure that assesses symptoms and severity of depression over the course of the last two weeks. Laporshia obtained a score of 10 suggesting moderate depression. Rakiyah finds the endorsed symptoms to be not difficult at all. Depression screen PHQ 2/9 12/14/2018  Decreased Interest 1  Down, Depressed, Hopeless 1  PHQ - 2 Score 2  Altered sleeping 2  Tired, decreased energy 2  Change in appetite 1  Feeling bad or failure about yourself  0  Trouble concentrating 1  Moving slowly or fidgety/restless 2  Suicidal thoughts 0  PHQ-9 Score 10  Difficult doing work/chores -   The Generalized Anxiety Disorder-7 (GAD-7) is a brief self-report measure that assesses symptoms of anxiety over the course of the last two weeks. Jax obtained a score of 2 suggesting minimal anxiety. GAD 7 : Generalized Anxiety Score 12/14/2018  Nervous, Anxious, on Edge 0  Control/stop worrying 0  Worry too much - different things 1  Trouble relaxing 1  Restless 0  Easily annoyed or irritable 0  Afraid - awful might happen 0  Total GAD 7 Score 2  Anxiety Difficulty Not difficult at all   Interventions: A chart review was conducted prior to the clinical intake interview. The MMSE-2, PHQ-9, and GAD-7 were administered and a clinical intake interview was completed. In addition, Aleysia was asked to complete a Mood and Food questionnaire to assess various behaviors related to emotional eating. Throughout session, empathic reflections and validation was provided. Continuing treatment with this provider was discussed and a treatment goal was established.  Psychoeducation regarding emotional versus physical hunger was provided. Audree was given a handout to utilize between now and the next appointment to increase awareness of hunger patterns and subsequent eating.   Provisional DSM-5 Diagnosis: 311 (F32.8) Other Specified Depressive Disorder,  Emotional Eating Behaviors  Plan: Munachimso appears able and willing to participate as evidenced by collaboration on a treatment goal, engagement in reciprocal conversation, and asking questions as needed for clarification. The next appointment will be scheduled in two weeks. The following treatment goal was established: decrease emotional eating. For the aforementioned goal, Sibbie can benefit from biweekly individual therapy sessions that are brief in duration for approximately four to six sessions. The treatment modality will be individual therapeutic services, including an eclectic therapeutic approach utilizing techniques from Cognitive Behavioral Therapy, Patient Centered Therapy, Dialectical Behavior Therapy, Acceptance and Commitment Therapy, Interpersonal Therapy, and Cognitive Restructuring. Therapeutic approach will include various interventions as appropriate, such as validation, support, mindfulness, thought defusion, reframing, psychoeducation, values assessment, and role playing. This provider will regularly review the treatment plan and medical chart to keep informed of status changes. Tanesia expressed understanding and agreement with the initial treatment plan of care.

## 2018-12-14 ENCOUNTER — Ambulatory Visit (INDEPENDENT_AMBULATORY_CARE_PROVIDER_SITE_OTHER): Payer: BC Managed Care – PPO | Admitting: Psychology

## 2018-12-14 ENCOUNTER — Ambulatory Visit (INDEPENDENT_AMBULATORY_CARE_PROVIDER_SITE_OTHER): Payer: BC Managed Care – PPO | Admitting: Bariatrics

## 2018-12-14 ENCOUNTER — Encounter (INDEPENDENT_AMBULATORY_CARE_PROVIDER_SITE_OTHER): Payer: Self-pay | Admitting: Bariatrics

## 2018-12-14 VITALS — BP 115/73 | HR 66 | Temp 97.8°F | Ht 64.0 in | Wt 175.0 lb

## 2018-12-14 DIAGNOSIS — E669 Obesity, unspecified: Secondary | ICD-10-CM | POA: Diagnosis not present

## 2018-12-14 DIAGNOSIS — F3289 Other specified depressive episodes: Secondary | ICD-10-CM | POA: Diagnosis not present

## 2018-12-14 DIAGNOSIS — Z6830 Body mass index (BMI) 30.0-30.9, adult: Secondary | ICD-10-CM | POA: Diagnosis not present

## 2018-12-14 DIAGNOSIS — E559 Vitamin D deficiency, unspecified: Secondary | ICD-10-CM | POA: Diagnosis not present

## 2018-12-14 DIAGNOSIS — Z9189 Other specified personal risk factors, not elsewhere classified: Secondary | ICD-10-CM

## 2018-12-14 NOTE — Progress Notes (Signed)
Office: [phone]  /  Fax: [phone]   HPI:   Chief Complaint: OBESITY Gwendolyn Nguyen is here to discuss her progress with her obesity treatment plan. She is on the Category 2 plan +100 calories for snacks and is following her eating plan approximately 75 % of the time. She states she is exercising 0 minutes 0 times per week. Gwendolyn Nguyen state that the plan was similar to what she had been eating. It was convenient. It was hard for [facility] to give up snacks. Her weight is 175 lb (79.4 kg) today and has had a weight loss of 5 pounds over a period of 2 to 3 weeks since her last visit. She has lost 5 lbs since starting treatment with [place].  Vitamin D deficiency Gwendolyn Nguyen has a diagnosis of vitamin D deficiency. She is not currently taking vit D and denies nausea, vomiting or muscle weakness.  At risk for osteopenia and osteoporosis Gwendolyn Nguyen is at higher risk of osteopenia and osteoporosis due to vitamin D deficiency.   Depression with emotional eating behaviors Gwendolyn Nguyen is seeing Dr. Mallie Mussel. She struggles with emotional eating and using food for comfort to the extent that it is negatively impacting her health. She often snacks when she is not hungry. Gwendolyn Nguyen sometimes feels she is out of control and then feels guilty that she made poor food choices. She has been working on behavior modification techniques to help reduce her emotional eating and has been somewhat successful. She shows no sign of suicidal or homicidal ideations.  Depression screen [facility] 2/9 12/14/2018 11/25/2018  Decreased Interest 1 2  Down, Depressed, Hopeless 1 1  PHQ - 2 Score 2 3  Altered sleeping 2 1  Tired, decreased energy 2 2  Change in appetite 1 3  Feeling bad or failure about yourself  0 1  Trouble concentrating 1 1  Moving slowly or fidgety/restless 2 0  Suicidal thoughts 0 0  PHQ-9 Score 10 11  Difficult doing work/chores - Not difficult at all    ASSESSMENT AND PLAN:  Vitamin D deficiency  Other depression - with  emotional eating  At risk for osteoporosis  Class 1 obesity with serious comorbidity and body mass index (BMI) of 30.0 to 30.9 in adult, unspecified obesity type  PLAN:  Vitamin D Deficiency [name] was informed that low vitamin D levels contributes to fatigue and are associated with obesity, breast, and colon cancer. She agrees to begin OTC Vit D @2 ,000 IU once daily and will follow up for routine testing of vitamin D, at least 2-3 times per year. She was informed of the risk of over-replacement of vitamin D and agrees to not increase her dose unless she discusses this with [place] first. [name] agrees to follow up as directed.  At risk for osteopenia and osteoporosis Gwendolyn Nguyen was given extended  (15 minutes) osteoporosis prevention counseling today. Gwendolyn Nguyen is at risk for osteopenia and osteoporosis due to her vitamin D deficiency. She was encouraged to take her vitamin D and follow her higher calcium diet and increase strengthening exercise to help strengthen her bones and decrease her risk of osteopenia and osteoporosis.  Depression with Emotional Eating Behaviors We discussed behavior modification techniques today to help Gwendolyn Nguyen deal with her emotional eating and depression. She will continue to see Dr. Mallie Mussel.  Obesity Gwendolyn Nguyen is currently in the action stage of change. As such, her goal is to continue with weight loss efforts She has agreed to follow the Category 2 plan +100 calories for snacks Gwendolyn Nguyen has  been instructed to work up to a goal of 150 minutes of combined cardio and strengthening exercise per week for weight loss and overall health benefits. We discussed the following Behavioral Modification Strategies today: increase H2O intake, no skipping meals, increasing lean protein intake, decreasing simple carbohydrates, increasing vegetables, decrease eating out, work on meal planning and easy cooking plans and decrease liquid calories Handouts for homemade seasonings and store  bought seasonings were provided to patient today.  Gwendolyn Nguyen has agreed to follow up with our clinic in 2 weeks. She was informed of the importance of frequent follow up visits to maximize her success with intensive lifestyle modifications for her multiple health conditions.  ALLERGIES: Allergies  Allergen Reactions  . Gadolinium Derivatives Hives, Itching and Nausea And Vomiting    Pt vomited immediately during injection.  15 minutes later, pt developed hives and itching.   . Shellfish Allergy Hives  . Sulfa Antibiotics Other (See Comments)    Patient was told to NOT TAKE THIS    MEDICATIONS: Current Outpatient Medications on File Prior to Visit  Medication Sig Dispense Refill  . Black Currant Seed Oil 500 MG CAPS Take 1 capsule by mouth daily.    . ferrous sulfate 325 (65 FE) MG tablet Take 325 mg by mouth daily with breakfast.    . Peppermint Oil (IBGARD PO) Take 1 capsule by mouth daily.    . vitamin E 1000 UNIT capsule Take 1,000 Units by mouth daily.     No current facility-administered medications on file prior to visit.     PAST MEDICAL HISTORY: Past Medical History:  Diagnosis Date  . Anemia   . Back pain   . Constipation   . Endometriosis   . IBS (irritable bowel syndrome)   . Low hemoglobin   . Multiple food allergies     PAST SURGICAL HISTORY: History reviewed. No pertinent surgical history.  SOCIAL HISTORY: Social History   Tobacco Use  . Smoking status: Never Smoker  . Smokeless tobacco: Never Used  Substance Use Topics  . Alcohol use: Yes    Comment: occassional  . Drug use: Not on file    FAMILY HISTORY: Family History  Problem Relation Age of Onset  . Hyperlipidemia Mother   . Hypertension Mother   . Obesity Mother   . Hyperlipidemia Father   . Hypertension Father   . Obesity Father     ROS: Review of Systems  Constitutional: Positive for weight loss.  Gastrointestinal: Negative for nausea and vomiting.  Musculoskeletal:        Negative for muscle weakness  Psychiatric/Behavioral: Positive for depression. Negative for suicidal ideas.    PHYSICAL EXAM: Blood pressure 115/73, pulse 66, temperature 97.8 F (36.6 C), temperature source Oral, height 5\' 4"  (1.626 m), weight 175 lb (79.4 kg), SpO2 100 %. Body mass index is 30.04 kg/m. Physical Exam Vitals signs reviewed.  Constitutional:      Appearance: Normal appearance. She is well-developed. She is obese.  Cardiovascular:     Rate and Rhythm: Normal rate.  Pulmonary:     Effort: Pulmonary effort is normal.  Musculoskeletal: Normal range of motion.  Skin:    General: Skin is warm and dry.  Neurological:     Mental Status: She is alert and oriented to person, place, and time.  Psychiatric:        Mood and Affect: Mood normal.        Behavior: Behavior normal.        Thought Content: Thought  content does not include homicidal or suicidal ideation.     RECENT LABS AND TESTS: BMET    Component Value Date/Time   NA 138 09/09/2018 1720   K 3.6 09/09/2018 1720   CL 106 09/09/2018 1720   CO2 24 09/09/2018 1720   GLUCOSE 80 09/09/2018 1720   BUN 11 09/09/2018 1720   CREATININE 1.01 (H) 09/09/2018 1720   CALCIUM 8.9 09/09/2018 1720   GFRNONAA >60 09/09/2018 1720   GFRAA >60 09/09/2018 1720   No results found for: HGBA1C Lab Results  Component Value Date   INSULIN 9.0 11/25/2018   CBC    Component Value Date/Time   WBC 8.0 09/09/2018 1720   RBC 4.08 09/09/2018 1720   HGB 8.1 (L) 09/09/2018 1720   HCT 29.1 (L) 09/09/2018 1720   PLT 324 09/09/2018 1720   MCV 71.3 (L) 09/09/2018 1720   MCH 19.9 (L) 09/09/2018 1720   MCHC 27.8 (L) 09/09/2018 1720   RDW 18.4 (H) 09/09/2018 1720   LYMPHSABS 1.9 09/09/2018 1720   MONOABS 0.5 09/09/2018 1720   EOSABS 0.3 09/09/2018 1720   BASOSABS 0.1 09/09/2018 1720   Iron/TIBC/Ferritin/ %Sat No results found for: IRON, TIBC, FERRITIN, IRONPCTSAT Lipid Panel     Component Value Date/Time   CHOL 150  11/25/2018 0928   TRIG 41 11/25/2018 0928   HDL 56 11/25/2018 0928   LDLCALC 86 11/25/2018 0928   Hepatic Function Panel     Component Value Date/Time   PROT 7.7 09/09/2018 1720   ALBUMIN 3.9 09/09/2018 1720   AST 14 (L) 09/09/2018 1720   ALT 10 09/09/2018 1720   ALKPHOS 64 09/09/2018 1720   BILITOT 0.4 09/09/2018 1720      Component Value Date/Time   TSH 1.160 11/25/2018 0928   Results for OLUKEMI, PANCHAL (MRN [id number]) as of 12/14/2018 16:19  Ref. Range 11/25/2018 09:28  Vitamin D, 25-Hydroxy Latest Ref Range: 30.0 - 100.0 ng/mL 45.4     OBESITY BEHAVIORAL INTERVENTION VISIT  Today's visit was # 2   Starting weight: 180 lbs Starting date: 11/25/2018 Today's weight : 175 lbs Today's date: 12/14/2018 Total lbs lost to date: 5    12/14/2018  Height 5\' 4"  (1.626 m)  Weight 175 lb (79.4 kg)  BMI (Calculated) 30.02  BLOOD PRESSURE - SYSTOLIC 564  BLOOD PRESSURE - DIASTOLIC 73   Body Fat % 37 %  Total Body Water (lbs) 72.6 lbs    ASK: We discussed the diagnosis of obesity with [name] today and Eve agreed to give [place] permission to discuss obesity behavioral modification therapy today.  ASSESS: Jovonda has the diagnosis of obesity and her BMI today is 30.02 Chelise is in the action stage of change   ADVISE: Nayab was educated on the multiple health risks of obesity as well as the benefit of weight loss to improve her health. She was advised of the need for long term treatment and the importance of lifestyle modifications to improve her current health and to decrease her risk of future health problems.  AGREE: Multiple dietary modification options and treatment options were discussed and  Yamila agreed to follow the recommendations documented in the above note.  ARRANGE: Ameisha was educated on the importance of frequent visits to treat obesity as outlined per CMS and USPSTF guidelines and agreed to schedule her next follow up appointment today.  Corey Skains, am acting as Location manager for [employer]. Owens Shark, DO  I have reviewed the above documentation for accuracy and  completeness, and I agree with the above. -Jearld Lesch, DO

## 2019-01-03 ENCOUNTER — Ambulatory Visit (INDEPENDENT_AMBULATORY_CARE_PROVIDER_SITE_OTHER): Payer: Self-pay | Admitting: Psychology

## 2019-01-03 ENCOUNTER — Ambulatory Visit (INDEPENDENT_AMBULATORY_CARE_PROVIDER_SITE_OTHER): Payer: Self-pay | Admitting: Bariatrics

## 2019-01-13 ENCOUNTER — Encounter (INDEPENDENT_AMBULATORY_CARE_PROVIDER_SITE_OTHER): Payer: Self-pay

## 2019-01-27 ENCOUNTER — Telehealth (INDEPENDENT_AMBULATORY_CARE_PROVIDER_SITE_OTHER): Payer: Self-pay | Admitting: Psychology

## 2019-01-27 NOTE — Telephone Encounter (Signed)
  Office: [phone]  /  Fax: [phone]  Date of Call: January 27, 2019 Time of Call: 10:11am Provider: Glennie Isle, PsyD  CONTENT: This provider called Gwendolyn Nguyen to check-in and schedule a follow-up appointment. A HIPAA compliant voicemail was left requesting a call back.  PLAN: This provider will wait for [name] to call back or send a MyChart message to schedule an appointment.

## 2019-02-15 ENCOUNTER — Telehealth (INDEPENDENT_AMBULATORY_CARE_PROVIDER_SITE_OTHER): Payer: Self-pay | Admitting: Psychology

## 2019-02-15 NOTE — Telephone Encounter (Signed)
  Office: [phone]  /  Fax: [phone]  Date of Call:  February 15, 2019 Time of Call: 4:02pm Provider: Glennie Isle, PsyD  CONTENT: This provider called Ivonne again to check-in and schedule a follow-up appointment. A HIPAA compliant voicemail was left requesting a call back.  PLAN: This provider will wait for Haniyyah to call back or send a MyChart message to schedule an appointment.

## 2019-04-20 DIAGNOSIS — N730 Acute parametritis and pelvic cellulitis: Secondary | ICD-10-CM

## 2019-04-20 HISTORY — DX: Acute parametritis and pelvic cellulitis: N73.0

## 2019-05-02 ENCOUNTER — Encounter (HOSPITAL_COMMUNITY): Payer: Self-pay

## 2019-05-02 ENCOUNTER — Other Ambulatory Visit: Payer: Self-pay

## 2019-05-02 ENCOUNTER — Emergency Department (HOSPITAL_COMMUNITY): Payer: BC Managed Care – PPO

## 2019-05-02 ENCOUNTER — Inpatient Hospital Stay (HOSPITAL_COMMUNITY)
Admission: EM | Admit: 2019-05-02 | Discharge: 2019-05-14 | DRG: 389 | Disposition: A | Discharge status: Home / Self Care | Payer: BC Managed Care – PPO | Attending: Student in an Organized Health Care Education/Training Program | Admitting: Student in an Organized Health Care Education/Training Program

## 2019-05-02 DIAGNOSIS — Z9889 Other specified postprocedural states: Secondary | ICD-10-CM | POA: Diagnosis not present

## 2019-05-02 DIAGNOSIS — N179 Acute kidney failure, unspecified: Secondary | ICD-10-CM | POA: Diagnosis present

## 2019-05-02 DIAGNOSIS — Z888 Allergy status to other drugs, medicaments and biological substances status: Secondary | ICD-10-CM

## 2019-05-02 DIAGNOSIS — R5381 Other malaise: Secondary | ICD-10-CM

## 2019-05-02 DIAGNOSIS — Z5329 Procedure and treatment not carried out because of patient's decision for other reasons: Secondary | ICD-10-CM | POA: Diagnosis not present

## 2019-05-02 DIAGNOSIS — K589 Irritable bowel syndrome without diarrhea: Secondary | ICD-10-CM | POA: Diagnosis present

## 2019-05-02 DIAGNOSIS — Z0189 Encounter for other specified special examinations: Secondary | ICD-10-CM

## 2019-05-02 DIAGNOSIS — N730 Acute parametritis and pelvic cellulitis: Secondary | ICD-10-CM | POA: Diagnosis not present

## 2019-05-02 DIAGNOSIS — D509 Iron deficiency anemia, unspecified: Secondary | ICD-10-CM | POA: Diagnosis not present

## 2019-05-02 DIAGNOSIS — N838 Other noninflammatory disorders of ovary, fallopian tube and broad ligament: Secondary | ICD-10-CM | POA: Diagnosis present

## 2019-05-02 DIAGNOSIS — Z8742 Personal history of other diseases of the female genital tract: Secondary | ICD-10-CM | POA: Diagnosis not present

## 2019-05-02 DIAGNOSIS — Z79899 Other long term (current) drug therapy: Secondary | ICD-10-CM | POA: Diagnosis not present

## 2019-05-02 DIAGNOSIS — Z91013 Allergy to seafood: Secondary | ICD-10-CM | POA: Diagnosis not present

## 2019-05-02 DIAGNOSIS — N739 Female pelvic inflammatory disease, unspecified: Secondary | ICD-10-CM | POA: Diagnosis not present

## 2019-05-02 DIAGNOSIS — D500 Iron deficiency anemia secondary to blood loss (chronic): Secondary | ICD-10-CM | POA: Diagnosis present

## 2019-05-02 DIAGNOSIS — R1900 Intra-abdominal and pelvic swelling, mass and lump, unspecified site: Secondary | ICD-10-CM | POA: Diagnosis not present

## 2019-05-02 DIAGNOSIS — N83292 Other ovarian cyst, left side: Secondary | ICD-10-CM | POA: Diagnosis present

## 2019-05-02 DIAGNOSIS — Z973 Presence of spectacles and contact lenses: Secondary | ICD-10-CM

## 2019-05-02 DIAGNOSIS — K56600 Partial intestinal obstruction, unspecified as to cause: Secondary | ICD-10-CM | POA: Diagnosis present

## 2019-05-02 DIAGNOSIS — E876 Hypokalemia: Secondary | ICD-10-CM | POA: Diagnosis not present

## 2019-05-02 DIAGNOSIS — D259 Leiomyoma of uterus, unspecified: Secondary | ICD-10-CM | POA: Diagnosis present

## 2019-05-02 DIAGNOSIS — Z20828 Contact with and (suspected) exposure to other viral communicable diseases: Secondary | ICD-10-CM | POA: Diagnosis present

## 2019-05-02 DIAGNOSIS — D72829 Elevated white blood cell count, unspecified: Secondary | ICD-10-CM | POA: Diagnosis not present

## 2019-05-02 DIAGNOSIS — R1031 Right lower quadrant pain: Secondary | ICD-10-CM | POA: Diagnosis present

## 2019-05-02 DIAGNOSIS — E8770 Fluid overload, unspecified: Secondary | ICD-10-CM | POA: Diagnosis not present

## 2019-05-02 DIAGNOSIS — K56609 Unspecified intestinal obstruction, unspecified as to partial versus complete obstruction: Secondary | ICD-10-CM | POA: Diagnosis not present

## 2019-05-02 DIAGNOSIS — Z4659 Encounter for fitting and adjustment of other gastrointestinal appliance and device: Secondary | ICD-10-CM

## 2019-05-02 DIAGNOSIS — N800 Endometriosis of uterus: Secondary | ICD-10-CM | POA: Diagnosis present

## 2019-05-02 DIAGNOSIS — Z882 Allergy status to sulfonamides status: Secondary | ICD-10-CM

## 2019-05-02 DIAGNOSIS — N920 Excessive and frequent menstruation with regular cycle: Secondary | ICD-10-CM | POA: Diagnosis present

## 2019-05-02 DIAGNOSIS — Z793 Long term (current) use of hormonal contraceptives: Secondary | ICD-10-CM | POA: Diagnosis not present

## 2019-05-02 DIAGNOSIS — N7093 Salpingitis and oophoritis, unspecified: Secondary | ICD-10-CM | POA: Diagnosis present

## 2019-05-02 DIAGNOSIS — E860 Dehydration: Secondary | ICD-10-CM | POA: Diagnosis present

## 2019-05-02 DIAGNOSIS — Z881 Allergy status to other antibiotic agents status: Secondary | ICD-10-CM | POA: Diagnosis not present

## 2019-05-02 DIAGNOSIS — N7013 Chronic salpingitis and oophoritis: Secondary | ICD-10-CM | POA: Diagnosis present

## 2019-05-02 DIAGNOSIS — R600 Localized edema: Secondary | ICD-10-CM | POA: Diagnosis not present

## 2019-05-02 DIAGNOSIS — N83291 Other ovarian cyst, right side: Secondary | ICD-10-CM | POA: Diagnosis present

## 2019-05-02 DIAGNOSIS — Z978 Presence of other specified devices: Secondary | ICD-10-CM | POA: Diagnosis not present

## 2019-05-02 DIAGNOSIS — R300 Dysuria: Secondary | ICD-10-CM | POA: Diagnosis present

## 2019-05-02 DIAGNOSIS — K567 Ileus, unspecified: Secondary | ICD-10-CM

## 2019-05-02 LAB — COMPREHENSIVE METABOLIC PANEL
ALT: 16 U/L (ref 0–44)
AST: 19 U/L (ref 15–41)
Albumin: 2.8 g/dL — ABNORMAL LOW (ref 3.5–5.0)
Alkaline Phosphatase: 63 U/L (ref 38–126)
Anion gap: 15 (ref 5–15)
BUN: 66 mg/dL — ABNORMAL HIGH (ref 6–20)
CO2: 27 mmol/L (ref 22–32)
Calcium: 9 mg/dL (ref 8.9–10.3)
Chloride: 92 mmol/L — ABNORMAL LOW (ref 98–111)
Creatinine, Ser: 2.79 mg/dL — ABNORMAL HIGH (ref 0.44–1.00)
GFR calc Af Amer: 25 mL/min — ABNORMAL LOW (ref >60)
GFR calc non Af Amer: 22 mL/min — ABNORMAL LOW (ref >60)
Glucose, Bld: 131 mg/dL — ABNORMAL HIGH (ref 70–99)
Potassium: 4 mmol/L (ref 3.5–5.1)
Sodium: 134 mmol/L — ABNORMAL LOW (ref 135–145)
Total Bilirubin: 2.1 mg/dL — ABNORMAL HIGH (ref 0.3–1.2)
Total Protein: 8.2 g/dL — ABNORMAL HIGH (ref 6.5–8.1)

## 2019-05-02 LAB — URINALYSIS, ROUTINE W REFLEX MICROSCOPIC
Bacteria, UA: NONE SEEN
Bilirubin Urine: NEGATIVE
Glucose, UA: NEGATIVE mg/dL
Hgb urine dipstick: NEGATIVE
Ketones, ur: NEGATIVE mg/dL
Leukocytes,Ua: NEGATIVE
Nitrite: NEGATIVE
Protein, ur: 30 mg/dL — AB
Specific Gravity, Urine: 1.025 (ref 1.005–1.030)
pH: 5 (ref 5.0–8.0)

## 2019-05-02 LAB — CBC
HCT: 36.7 % (ref 36.0–46.0)
Hemoglobin: 12.3 g/dL (ref 12.0–15.0)
MCH: 24.4 pg — ABNORMAL LOW (ref 26.0–34.0)
MCHC: 33.5 g/dL (ref 30.0–36.0)
MCV: 72.8 fL — ABNORMAL LOW (ref 80.0–100.0)
Platelets: 384 10*3/uL (ref 150–400)
RBC: 5.04 MIL/uL (ref 3.87–5.11)
RDW: 16.3 % — ABNORMAL HIGH (ref 11.5–15.5)
WBC: 22.2 10*3/uL — ABNORMAL HIGH (ref 4.0–10.5)
nRBC: 0.1 % (ref 0.0–0.2)

## 2019-05-02 LAB — CBC WITH DIFFERENTIAL/PLATELET
Abs Immature Granulocytes: 1.66 10*3/uL — ABNORMAL HIGH (ref 0.00–0.07)
Basophils Absolute: 0.2 10*3/uL — ABNORMAL HIGH (ref 0.0–0.1)
Basophils Relative: 1 %
Eosinophils Absolute: 0.1 10*3/uL (ref 0.0–0.5)
Eosinophils Relative: 1 %
HCT: 30.9 % — ABNORMAL LOW (ref 36.0–46.0)
Hemoglobin: 10.4 g/dL — ABNORMAL LOW (ref 12.0–15.0)
Immature Granulocytes: 8 %
Lymphocytes Relative: 8 %
Lymphs Abs: 1.6 10*3/uL (ref 0.7–4.0)
MCH: 24.5 pg — ABNORMAL LOW (ref 26.0–34.0)
MCHC: 33.7 g/dL (ref 30.0–36.0)
MCV: 72.9 fL — ABNORMAL LOW (ref 80.0–100.0)
Monocytes Absolute: 2.7 10*3/uL — ABNORMAL HIGH (ref 0.1–1.0)
Monocytes Relative: 13 %
Neutro Abs: 14.4 10*3/uL — ABNORMAL HIGH (ref 1.7–7.7)
Neutrophils Relative %: 69 %
Platelets: 334 10*3/uL (ref 150–400)
RBC: 4.24 MIL/uL (ref 3.87–5.11)
RDW: 16.4 % — ABNORMAL HIGH (ref 11.5–15.5)
WBC: 20.6 10*3/uL — ABNORMAL HIGH (ref 4.0–10.5)
nRBC: 0.1 % (ref 0.0–0.2)

## 2019-05-02 LAB — I-STAT BETA HCG BLOOD, ED (MC, WL, AP ONLY): I-stat hCG, quantitative: <5 m[IU]/mL (ref <5)

## 2019-05-02 LAB — LIPASE, BLOOD: Lipase: 57 U/L — ABNORMAL HIGH (ref 11–51)

## 2019-05-02 MED ORDER — SODIUM CHLORIDE 0.9% FLUSH
3.0000 mL | Freq: Once | INTRAVENOUS | Status: AC
  Administered 2019-05-02: 10:00:00 3 mL via INTRAVENOUS

## 2019-05-02 MED ORDER — ACETAMINOPHEN 650 MG RE SUPP: 650.0000 mg | Freq: Four times a day (QID) | RECTAL | Status: DC | PRN

## 2019-05-02 MED ORDER — HYDROMORPHONE HCL 1 MG/ML IJ SOLN
1.0000 mg | INTRAMUSCULAR | Status: DC | PRN
  Administered 2019-05-02 – 2019-05-13 (×30): 1 mg via INTRAVENOUS
  Filled 2019-05-02 (×30): qty 1

## 2019-05-02 MED ORDER — HEPARIN SODIUM (PORCINE) 5000 UNIT/ML IJ SOLN
5000.0000 [IU] | Freq: Three times a day (TID) | INTRAMUSCULAR | Status: AC
  Administered 2019-05-02 – 2019-05-09 (×19): 5000 [IU] via SUBCUTANEOUS
  Filled 2019-05-02 (×20): qty 1

## 2019-05-02 MED ORDER — HYDROMORPHONE HCL 1 MG/ML IJ SOLN
1.0000 mg | INTRAMUSCULAR | Status: DC | PRN
  Administered 2019-05-02: 1 mg via INTRAVENOUS
  Filled 2019-05-02 (×2): qty 1

## 2019-05-02 MED ORDER — HYDROMORPHONE HCL 1 MG/ML IJ SOLN
1.0000 mg | Freq: Once | INTRAMUSCULAR | Status: AC
  Administered 2019-05-02: 1 mg via INTRAVENOUS
  Filled 2019-05-02: qty 1

## 2019-05-02 MED ORDER — MORPHINE SULFATE (PF) 4 MG/ML IV SOLN
8.0000 mg | Freq: Once | INTRAVENOUS | Status: AC
  Administered 2019-05-02: 8 mg via INTRAVENOUS
  Filled 2019-05-02: qty 2

## 2019-05-02 MED ORDER — ONDANSETRON HCL 4 MG PO TABS: 4.0000 mg | ORAL_TABLET | Freq: Four times a day (QID) | ORAL | Status: DC | PRN

## 2019-05-02 MED ORDER — SODIUM CHLORIDE 0.9% FLUSH
3.0000 mL | Freq: Two times a day (BID) | INTRAVENOUS | Status: DC
  Administered 2019-05-05 – 2019-05-12 (×5): 3 mL via INTRAVENOUS

## 2019-05-02 MED ORDER — LORAZEPAM 2 MG/ML IJ SOLN
1.0000 mg | Freq: Once | INTRAMUSCULAR | Status: AC
  Administered 2019-05-02: 1 mg via INTRAVENOUS
  Filled 2019-05-02: qty 1

## 2019-05-02 MED ORDER — ONDANSETRON HCL 4 MG/2ML IJ SOLN
4.0000 mg | Freq: Four times a day (QID) | INTRAMUSCULAR | Status: DC | PRN
  Administered 2019-05-02 – 2019-05-05 (×7): 4 mg via INTRAVENOUS
  Filled 2019-05-02 (×7): qty 2

## 2019-05-02 MED ORDER — LACTATED RINGERS IV BOLUS
1000.0000 mL | Freq: Once | INTRAVENOUS | Status: AC
  Administered 2019-05-02: 12:00:00 1000 mL via INTRAVENOUS

## 2019-05-02 MED ORDER — ENOXAPARIN SODIUM 40 MG/0.4ML ~~LOC~~ SOLN: 40.0000 mg | SUBCUTANEOUS | Status: DC

## 2019-05-02 MED ORDER — SODIUM CHLORIDE 0.9 % IV BOLUS
1000.0000 mL | Freq: Once | INTRAVENOUS | Status: AC
  Administered 2019-05-02: 10:00:00 1000 mL via INTRAVENOUS

## 2019-05-02 MED ORDER — SODIUM CHLORIDE 0.9 % IV SOLN
INTRAVENOUS | Status: DC
  Administered 2019-05-02 – 2019-05-08 (×9): via INTRAVENOUS

## 2019-05-02 MED ORDER — ACETAMINOPHEN 325 MG PO TABS
650.0000 mg | ORAL_TABLET | Freq: Four times a day (QID) | ORAL | Status: DC | PRN
  Administered 2019-05-09 – 2019-05-14 (×3): 650 mg via ORAL
  Filled 2019-05-02 (×4): qty 2

## 2019-05-02 MED ORDER — ONDANSETRON HCL 4 MG/2ML IJ SOLN
4.0000 mg | Freq: Once | INTRAMUSCULAR | Status: AC
  Administered 2019-05-02: 10:00:00 4 mg via INTRAVENOUS
  Filled 2019-05-02: qty 2

## 2019-05-02 MED ORDER — HYDROMORPHONE HCL 1 MG/ML IJ SOLN
0.7500 mg | Freq: Once | INTRAMUSCULAR | Status: AC
  Administered 2019-05-02: 0.75 mg via INTRAVENOUS
  Filled 2019-05-02: qty 1

## 2019-05-02 NOTE — ED Provider Notes (Signed)
[facility] EMERGENCY DEPARTMENT Provider Note   CSN: [id number] Arrival date & time: 05/02/19  0240     History   Chief Complaint Chief Complaint  Patient presents with   Back Pain    HPI Gwendolyn Nguyen is a 33 y.o. female.     HPI   33yF with lower back and R abdominal pain. Has had for months but worsening in the past several days. Previous evaluations for it and says she was told he has fibroids and endometriosis. The pain has been progressive and currently very severe. She is out of her pain medications. The pain ir worse with movement. Nausea. No v/d. No urinary complaints. Says she has had irregular vaginal bleeding which is not acutely changed. No discharge. No fever or chills.   Past Medical History:  Diagnosis Date   Anemia    Back pain    Constipation    Endometriosis    IBS (irritable bowel syndrome)    Low hemoglobin    Multiple food allergies     Patient Active Problem List   Diagnosis Date Noted   Elevated glucose 11/29/2018   Class 1 obesity with serious comorbidity and body mass index (BMI) of 30.0 to 30.9 in adult 11/29/2018    History reviewed. No pertinent surgical history.   OB History    Gravida  0   Para  0   Term  0   Preterm  0   AB  0   Living  0     SAB  0   TAB  0   Ectopic  0   Multiple  0   Live Births  0            Home Medications    Prior to Admission medications   Medication Sig Start Date End Date Taking? Authorizing Provider  Black Currant Seed Oil 500 MG CAPS Take 1 capsule by mouth daily.    [provider]  ferrous sulfate 325 (65 FE) MG tablet Take 325 mg by mouth daily with breakfast.    [provider]  Peppermint Oil (IBGARD PO) Take 1 capsule by mouth daily.    [provider]  vitamin E 1000 UNIT capsule Take 1,000 Units by mouth daily.    [provider]    Family History Family History  Problem Relation Age of Onset    Hyperlipidemia Mother    Hypertension Mother    Obesity Mother    Hyperlipidemia Father    Hypertension Father    Obesity Father     Social History Social History   Tobacco Use   Smoking status: Never Smoker   Smokeless tobacco: Never Used  Substance Use Topics   Alcohol use: Yes    Comment: occassional   Drug use: Not on file     Allergies   Gadolinium derivatives, Shellfish allergy, and Sulfa antibiotics   Review of Systems Review of Systems  All systems reviewed and negative, other than as noted in HPI.  Physical Exam Updated Vital Signs BP (!) 130/95 (BP Location: Right Arm)    Pulse (!) 112    Temp 98.4 F (36.9 C) (Oral)    Resp 18    SpO2 97%   Physical Exam Vitals signs and nursing note reviewed.  Constitutional:      General: She is not in acute distress.    Appearance: She is well-developed.  HENT:     Head: Normocephalic and atraumatic.  Eyes:  General:        Right eye: No discharge.        Left eye: No discharge.     Conjunctiva/sclera: Conjunctivae normal.  Neck:     Musculoskeletal: Neck supple.  Cardiovascular:     Rate and Rhythm: Normal rate and regular rhythm.     Heart sounds: Normal heart sounds. No murmur. No friction rub. No gallop.   Pulmonary:     Effort: Pulmonary effort is normal. No respiratory distress.     Breath sounds: Normal breath sounds.  Abdominal:     General: There is no distension.     Palpations: Abdomen is soft.     Tenderness: There is abdominal tenderness.     Comments: Tenderness to palpation in the mid right abdomen.  No rebound or guarding.  No distention.  Musculoskeletal:        General: No tenderness.  Skin:    General: Skin is warm and dry.  Neurological:     Mental Status: She is alert.  Psychiatric:        Behavior: Behavior normal.        Thought Content: Thought content normal.      ED Treatments / Results  Labs (all labs ordered are listed, but only abnormal results are  displayed) Labs Reviewed  LIPASE, BLOOD - Abnormal; Notable for the following components:      Result Value   Lipase 57 (*)    All other components within normal limits  COMPREHENSIVE METABOLIC PANEL - Abnormal; Notable for the following components:   Sodium 134 (*)    Chloride 92 (*)    Glucose, Bld 131 (*)    BUN 66 (*)    Creatinine, Ser 2.79 (*)    Total Protein 8.2 (*)    Albumin 2.8 (*)    Total Bilirubin 2.1 (*)    GFR calc non Af Amer 22 (*)    GFR calc Af Amer 25 (*)    All other components within normal limits  CBC - Abnormal; Notable for the following components:   WBC 22.2 (*)    MCV 72.8 (*)    MCH 24.4 (*)    RDW 16.3 (*)    All other components within normal limits  NOVEL CORONAVIRUS, NAA (HOSPITAL ORDER, SEND-OUT TO REF LAB)  URINALYSIS, ROUTINE W REFLEX MICROSCOPIC  I-STAT BETA HCG BLOOD, ED ([facility], [facility], [facility] ONLY)    EKG None  Radiology Ct Abdomen Pelvis Wo Contrast  Result Date: 05/02/2019 CLINICAL DATA:  Right-sided back pain. EXAM: CT ABDOMEN AND PELVIS WITHOUT CONTRAST TECHNIQUE: Multidetector CT imaging of the abdomen and pelvis was performed following the standard protocol without IV contrast. COMPARISON:  Abdominal CT September 09, 2018, pelvic MRI December 02, 2018 FINDINGS: Lower chest: Right middle and lower lobe atelectasis versus scarring. 4 mm ground-glass nodule in the right upper lobe, image 2/26, sequence 7. Hepatobiliary: No focal liver abnormality is seen. No gallstones, gallbladder wall thickening, or biliary dilatation. Pancreas: Unremarkable. No pancreatic ductal dilatation or surrounding inflammatory changes. Spleen: Normal in size without focal abnormality. Adrenals/Urinary Tract: Adrenal glands are unremarkable. Kidneys are normal, without renal calculi, focal lesion, or hydronephrosis. Bladder is unremarkable. Stomach/Bowel: Numerous prominent, but sub pathologic by CT criteria small bowel loops with gas fluid levels. The colon is decompressed.  Vascular/Lymphatic: No significant vascular findings are present. No enlarged abdominal or pelvic lymph nodes. Shotty retroperitoneal lymph nodes, sub pathologic by CT criteria and nonspecific. Reproductive: There is a complex cystic lesion  associated with the left adnexa, which measures 5.6 cm in greatest dimension, relatively stable. There is a complex cystic lesion associated with the right adnexa which measures approximately 4 poor in 9 cm, also relatively stable from the prior CT. However, there has been an interval development of a new intermediate density mass in the right pelvis which measures 6.8 x 4.9 cm, image 79/98, sequence 3. A connection to the right adnexa is suspected. The uterus is normal. Other: No abdominal wall hernia or abnormality. No abdominopelvic ascites. Musculoskeletal: Ill-defined sclerotic lesion within the body of the sternum measures 9 mm, image 7/98, sequence 3. 2 smaller sclerotic foci are present within the left ilium, measuring 4 and 5 mm, image 66/98, sequence 3. IMPRESSION: 1. Numerous mildly distended, technically sub pathologic by CT criteria, small bowel loops with gas fluid levels. The colon is decompressed. The findings are nonspecific but may represent early/low-grade small bowel obstruction versus ileus. 2. Known bilateral complex cystic adnexal masses, with interval development of a third 6.8 cm intermediate density mass in the right pelvis, which likely arises in the right adnexa. Further evaluation with pelvic ultrasound or MRI without and with contrast, and OBGYN consultation may be considered. 3. Ill-defined sclerotic foci within the body of the sternum and left ilium, which may represent bone islands, however osseous metastatic disease cannot be excluded. 4. Right middle and lower lobe atelectasis versus scarring. 4 mm ground-glass nodule in the right upper lobe. No follow-up needed if patient is low-risk. Non-contrast chest CT can be considered in 12 months if  patient is high-risk. This recommendation follows the consensus statement: Guidelines for Management of Incidental Pulmonary Nodules Detected on CT Images: From the Fleischner Society 2017; Radiology 2017; 284:228-243. These results were called by telephone at the time of interpretation on 05/02/2019 at 11:14 am to Dr. Virgel Manifold , who verbally acknowledged these results. Electronically Signed   By: Fidela Salisbury M.D.   On: 05/02/2019 11:17    Procedures Procedures (including critical care time)  Medications Ordered in ED Medications  sodium chloride flush (NS) 0.9 % injection 3 mL (has no administration in time range)     Initial Impression / Assessment and Plan / ED Course  I have reviewed the triage vital signs and the nursing notes.  Pertinent labs & imaging results that were available during my care of the patient were reviewed by me and considered in my medical decision making (see chart for details).     33yF with increasing back and abdominal pain. CT today with known pelvic masses with interval development of an additional one. Previously seen by Laurin Coder with [facility]. Dr Garwin Brothers on call, but going into OR. Briefly discussed. No additional recommendation at this time but would give information to oncoming provider, Dr Valentino Saxon. AKI. Pt reports poor appetite because of pain. Some vomiting.  BUN would suggest pre-renal. No hydronephrosis noted on CT.   Final Clinical Impressions(s) / ED Diagnoses   Final diagnoses:  Ileus ([place])  SBO (small bowel obstruction) ([place])  Bowel obstruction ([place])  Pelvic mass    ED Discharge Orders    None       Virgel Manifold, MD 05/06/19 1113

## 2019-05-02 NOTE — ED Notes (Signed)
Got patient undress into a gown on the monitor did vitals patient is resting with call bell in reach

## 2019-05-02 NOTE — H&P (Signed)
Date: 05/02/2019               Patient Name:  Gwendolyn Nguyen MRN: [id number]  DOB: Dec 07, 1985 Age / Sex: 33 y.o., female   PCP: Patient, No Pcp Per         Medical Service: Internal Medicine Teaching Service         Attending Physician: Dr. Sid Falcon, MD    First Contact: Dr. Court Joy Pager: [phone]   Second Contact: Dr. Trilby Drummer Pager: [phone]       After Hours (After 5p/  First Contact Pager: [phone]  weekends / holidays): Second Contact Pager: [phone]   Chief Complaint: Abdominal Pain and lower back pain  History of Present Illness: Gwendolyn Nguyen is a 33 year-old F with IBS, endometriosis, and uterine fibroids presenting with abdominal pain and lower back pain. Patient followed by Ob/Gyn for fibroids and endometriosis. Reports recent medication change to 2 OCP 2 weeks ago and she started to feel nauseas after taken the medications for a week. Told to stop taking one of the OCP and restarting it at the end of the week. Reports approximately 2 weeks of nausea/vomitting and has been unable to keep these medications down. Pain has become unbearable and she feel very weak from dehydration she believes.  Also states she has been constipated. Small bowel movement today and previous bowel movement one week ago. Denies melena, cough, dysuria, sob, or chest pain.   Meds:  Current Meds  Medication Sig  . Black Currant Seed Oil 500 MG CAPS Take 1 capsule by mouth daily.  . ferrous sulfate 325 (65 FE) MG tablet Take 325 mg by mouth daily with breakfast.  . Peppermint Oil (IBGARD PO) Take 1 capsule by mouth daily.  . vitamin E 1000 UNIT capsule Take 1,000 Units by mouth daily.     Allergies: Allergies as of 05/02/2019 - Review Complete 05/02/2019  Allergen Reaction Noted  . Gadolinium derivatives Hives, Itching, and Nausea And Vomiting 12/02/2018  . Shellfish allergy Hives 10/16/2014  . Sulfa antibiotics Other (See Comments) 09/09/2018   Past Medical History:  Diagnosis Date   . Anemia   . Back pain   . Constipation   . Endometriosis   . IBS (irritable bowel syndrome)   . Low hemoglobin   . Multiple food allergies     Family History:  Family History  Problem Relation Age of Onset  . Hyperlipidemia Mother   . Hypertension Mother   . Obesity Mother   . Hyperlipidemia Father   . Hypertension Father   . Obesity Father      Social History:  Social History   Tobacco Use  . Smoking status: Never Smoker  . Smokeless tobacco: Never Used  Substance Use Topics  . Alcohol use: Yes    Comment: occassional  . Drug use: Not on file     Review of Systems: A complete ROS was negative except as per HPI.   Physical Exam: Blood pressure 126/90, pulse 89, temperature 98.4 F (36.9 C), temperature source Oral, resp. rate 20, SpO2 94 %. Physical Exam Constitutional:      General: She is in acute distress.     Appearance: She is ill-appearing.  HENT:     Head: Normocephalic and atraumatic.  Eyes:     General: No scleral icterus. Cardiovascular:     Rate and Rhythm: Normal rate and regular rhythm.     Pulses: Normal pulses.     Heart sounds: Normal  heart sounds. No murmur.  Pulmonary:     Effort: Pulmonary effort is normal.     Breath sounds: Normal breath sounds.  Abdominal:     General: Bowel sounds are normal. There is distension.     Tenderness: There is abdominal tenderness. There is no rebound.  Genitourinary:    Comments: Deferred Musculoskeletal:     Right lower leg: No edema.     Left lower leg: No edema.  Skin:    General: Skin is warm and dry.  Psychiatric:        Behavior: Behavior normal.        Thought Content: Thought content normal.     CT Abdomen Pelvis IMPRESSION: 1. Numerous mildly distended, technically sub pathologic by CT criteria, small bowel loops with gas fluid levels. The colon is decompressed. The findings are nonspecific but may represent early/low-grade small bowel obstruction versus ileus. 2. Known bilateral  complex cystic adnexal masses, with interval development of a third 6.8 cm intermediate density mass in the right pelvis, which likely arises in the right adnexa. Further evaluation with pelvic ultrasound or MRI without and with contrast, and OBGYN consultation may be considered. 3. Ill-defined sclerotic foci within the body of the sternum and left ilium, which may represent bone islands, however osseous metastatic disease cannot be excluded. 4. Right middle and lower lobe atelectasis versus scarring. 4 mm ground-glass nodule in the right upper lobe. No follow-up needed if patient is low-risk. Non-contrast chest CT can be considered in 12 months if patient is high-risk. This recommendation follows the consensus statement: Guidelines for Management of Incidental Pulmonary Nodules Detected on CT Images: From the Fleischner Society 2017; Radiology 2017; 284:228-243.  Assessment & Plan by Problem: Active Problems:   Pelvic mass  Pelvic mass Small bowel obstruction Patient presents with distended abdomen and abdominal pain. CT abdomen pelvis results above. Pain likely 2/2 known fibroids and endometriosis. OB/Gyn consulted to evaluate further. Air fluid levels on CT [place] Pelvis concerning for early small bowel obstruction. Bowel sounds present. Will continue to monitor and give IV fluids overnight.  - appreciate OB/Gyn recommendations - PRN dilaudid  - 189ml/hr NS - Zofran PRN   AKI On admission Creatinine 2.79. Baseline 09/09/2018 1.01. - IV fluids  - BMP   Dispo: Admit patient to Observation with expected length of stay less than 2 midnights.  Signed: Tamsen Snider, MD PGY1  [phone]

## 2019-05-02 NOTE — ED Notes (Signed)
ED TO INPATIENT HANDOFF REPORT  ED Nurse Name and Phone #: [phone] Gwendolyn Nguyen  S Name/Age/Gender Gwendolyn Nguyen 33 y.o. female Room/Bed: 026C/026C  Code Status   Code Status: Full Code  Home/SNF/Other Home Patient oriented to: self, place, time and situation Is this baseline? Yes   Triage Complete: Triage complete  Chief Complaint pain  Triage Note From home with severe back and abdominal pain. Recent diagnosis of endometriosis and fibroids. Patient c/o loss of appetite, N/V, and SOB.    Allergies Allergies  Allergen Reactions  . Gadolinium Derivatives Hives, Itching and Nausea And Vomiting    Pt vomited immediately during injection.  15 minutes later, pt developed hives and itching.   . Shellfish Allergy Hives  . Sulfa Antibiotics Other (See Comments)    Patient was told to NOT TAKE THIS    Level of Care/Admitting Diagnosis ED Disposition    ED Disposition Condition [facility] [place] [100100]  Level of Care: Med-Surg [16]  Covid Evaluation: Asymptomatic Screening Protocol (No Symptoms)  Diagnosis: Pelvic mass [450388]  Admitting Physician: Cresenciano Lick  Attending Physician: Cresenciano Lick  Estimated length of stay: past midnight tomorrow  Certification:: I certify this patient will need inpatient services for at least 2 midnights  PT Class (Do Not Modify): Inpatient [101]  PT Acc Code (Do Not Modify): Private [1]       B Medical/Surgery History Past Medical History:  Diagnosis Date  . Anemia   . Back pain   . Constipation   . Endometriosis   . IBS (irritable bowel syndrome)   . Low hemoglobin   . Multiple food allergies    History reviewed. No pertinent surgical history.   A IV Location/Drains/Wounds Patient Lines/Drains/Airways Status   Active Line/Drains/Airways    Name:   Placement date:   Placement time:   Site:   Days:   Peripheral IV 05/02/19 Left Antecubital   05/02/19    0926     Antecubital   less than 1          Intake/Output Last 24 hours  Intake/Output Summary (Last 24 hours) at 05/02/2019 1451 Last data filed at 05/02/2019 1220 Gross per 24 hour  Intake 1000 ml  Output -  Net 1000 ml    Labs/Imaging Results for orders placed or performed during the hospital encounter of 05/02/19 (from the past 48 hour(s))  Lipase, blood     Status: Abnormal   Collection Time: 05/02/19  9:18 AM  Result Value Ref Range   Lipase 57 (H) 11 - 51 U/L    Comment: Performed at [facility], [place] [address] [place], [place] [address]  Comprehensive metabolic panel     Status: Abnormal   Collection Time: 05/02/19  9:18 AM  Result Value Ref Range   Sodium 134 (L) 135 - 145 mmol/L   Potassium 4.0 3.5 - 5.1 mmol/L   Chloride 92 (L) 98 - 111 mmol/L   CO2 27 22 - 32 mmol/L   Glucose, Bld 131 (H) 70 - 99 mg/dL   BUN 66 (H) 6 - 20 mg/dL   Creatinine, Ser 2.79 (H) 0.44 - 1.00 mg/dL   Calcium 9.0 8.9 - 10.3 mg/dL   Total Protein 8.2 (H) 6.5 - 8.1 g/dL   Albumin 2.8 (L) 3.5 - 5.0 g/dL   AST 19 15 - 41 U/L   ALT 16 0 - 44 U/L   Alkaline Phosphatase 63 38 -  126 U/L   Total Bilirubin 2.1 (H) 0.3 - 1.2 mg/dL   GFR calc non Af Amer 22 (L) >60 mL/min   GFR calc Af Amer 25 (L) >60 mL/min   Anion gap 15 5 - 15    Comment: Performed at [facility] [address] [place], [place] [address]  CBC     Status: Abnormal   Collection Time: 05/02/19  9:18 AM  Result Value Ref Range   WBC 22.2 (H) 4.0 - 10.5 K/uL   RBC 5.04 3.87 - 5.11 MIL/uL   Hemoglobin 12.3 12.0 - 15.0 g/dL   HCT 36.7 36.0 - 46.0 %   MCV 72.8 (L) 80.0 - 100.0 fL   MCH 24.4 (L) 26.0 - 34.0 pg   MCHC 33.5 30.0 - 36.0 g/dL   RDW 16.3 (H) 11.5 - 15.5 %   Platelets 384 150 - 400 K/uL   nRBC 0.1 0.0 - 0.2 %    Comment: Performed at [facility], [place] [address] [place], [place] [address]  I-Stat beta hCG blood, ED     Status: None   Collection Time: 05/02/19  9:23 AM  Result Value Ref Range    I-stat hCG, quantitative <5.0 <5 mIU/mL   Comment 3            Comment:   GEST. AGE      CONC.  (mIU/mL)   <=1 WEEK        5 - 50     2 WEEKS       50 - 500     3 WEEKS       100 - 10,000     4 WEEKS     1,000 - 30,000        FEMALE AND NON-PREGNANT FEMALE:     LESS THAN 5 mIU/mL   Urinalysis, Routine w reflex microscopic     Status: Abnormal   Collection Time: 05/02/19  1:00 PM  Result Value Ref Range   Color, Urine AMBER (A) YELLOW    Comment: BIOCHEMICALS MAY BE AFFECTED BY COLOR   APPearance HAZY (A) CLEAR   Specific Gravity, Urine 1.025 1.005 - 1.030   pH 5.0 5.0 - 8.0   Glucose, UA NEGATIVE NEGATIVE mg/dL   Hgb urine dipstick NEGATIVE NEGATIVE   Bilirubin Urine NEGATIVE NEGATIVE   Ketones, ur NEGATIVE NEGATIVE mg/dL   Protein, ur 30 (A) NEGATIVE mg/dL   Nitrite NEGATIVE NEGATIVE   Leukocytes,Ua NEGATIVE NEGATIVE   RBC / HPF 0-5 0 - 5 RBC/hpf   WBC, UA 0-5 0 - 5 WBC/hpf   Bacteria, UA NONE SEEN NONE SEEN   Squamous Epithelial / LPF 0-5 0 - 5    Comment: Performed at [facility], [place] [address] [place], [place] [address]   Ct Abdomen Pelvis Wo Contrast  Result Date: 05/02/2019 CLINICAL DATA:  Right-sided back pain. EXAM: CT ABDOMEN AND PELVIS WITHOUT CONTRAST TECHNIQUE: Multidetector CT imaging of the abdomen and pelvis was performed following the standard protocol without IV contrast. COMPARISON:  Abdominal CT September 09, 2018, pelvic MRI December 02, 2018 FINDINGS: Lower chest: Right middle and lower lobe atelectasis versus scarring. 4 mm ground-glass nodule in the right upper lobe, image 2/26, sequence 7. Hepatobiliary: No focal liver abnormality is seen. No gallstones, gallbladder wall thickening, or biliary dilatation. Pancreas: Unremarkable. No pancreatic ductal dilatation or surrounding inflammatory changes. Spleen: Normal in size without focal abnormality. Adrenals/Urinary Tract: Adrenal glands are unremarkable. Kidneys are  normal, without renal calculi, focal  lesion, or hydronephrosis. Bladder is unremarkable. Stomach/Bowel: Numerous prominent, but sub pathologic by CT criteria small bowel loops with gas fluid levels. The colon is decompressed. Vascular/Lymphatic: No significant vascular findings are present. No enlarged abdominal or pelvic lymph nodes. Shotty retroperitoneal lymph nodes, sub pathologic by CT criteria and nonspecific. Reproductive: There is a complex cystic lesion associated with the left adnexa, which measures 5.6 cm in greatest dimension, relatively stable. There is a complex cystic lesion associated with the right adnexa which measures approximately 4 poor in 9 cm, also relatively stable from the prior CT. However, there has been an interval development of a new intermediate density mass in the right pelvis which measures 6.8 x 4.9 cm, image 79/98, sequence 3. A connection to the right adnexa is suspected. The uterus is normal. Other: No abdominal wall hernia or abnormality. No abdominopelvic ascites. Musculoskeletal: Ill-defined sclerotic lesion within the body of the sternum measures 9 mm, image 7/98, sequence 3. 2 smaller sclerotic foci are present within the left ilium, measuring 4 and 5 mm, image 66/98, sequence 3. IMPRESSION: 1. Numerous mildly distended, technically sub pathologic by CT criteria, small bowel loops with gas fluid levels. The colon is decompressed. The findings are nonspecific but may represent early/low-grade small bowel obstruction versus ileus. 2. Known bilateral complex cystic adnexal masses, with interval development of a third 6.8 cm intermediate density mass in the right pelvis, which likely arises in the right adnexa. Further evaluation with pelvic ultrasound or MRI without and with contrast, and OBGYN consultation may be considered. 3. Ill-defined sclerotic foci within the body of the sternum and left ilium, which may represent bone islands, however osseous metastatic disease cannot be excluded. 4. Right middle and lower  lobe atelectasis versus scarring. 4 mm ground-glass nodule in the right upper lobe. No follow-up needed if patient is low-risk. Non-contrast chest CT can be considered in 12 months if patient is high-risk. This recommendation follows the consensus statement: Guidelines for Management of Incidental Pulmonary Nodules Detected on CT Images: From the Fleischner Society 2017; Radiology 2017; 284:228-243. These results were called by telephone at the time of interpretation on 05/02/2019 at 11:14 am to Dr. Virgel Manifold , who verbally acknowledged these results. Electronically Signed   By: Fidela Salisbury M.D.   On: 05/02/2019 11:17    Pending Labs Unresulted Labs (From admission, onward)    Start     Ordered   05/09/19 0500  Creatinine, serum  (enoxaparin (LOVENOX)    CrCl >/= 30 ml/min)  Weekly,   R    Comments: while on enoxaparin therapy    05/02/19 1441   05/03/19 5284  Basic metabolic panel  Tomorrow morning,   R     05/02/19 1441   05/03/19 0500  CBC  Tomorrow morning,   R     05/02/19 1441   05/02/19 1436  CBC with Differential/Platelet  Add-on,   AD     05/02/19 1441   05/02/19 1433  HIV antibody (Routine Testing)  Once,   STAT     05/02/19 1441   05/02/19 1433  CBC  (enoxaparin (LOVENOX)    CrCl >/= 30 ml/min)  Once,   STAT    Comments: Baseline for enoxaparin therapy IF NOT ALREADY DRAWN.  Notify MD if PLT < 100 K.    05/02/19 1441   05/02/19 1433  Creatinine, serum  (enoxaparin (LOVENOX)    CrCl >/= 30 ml/min)  Once,   STAT  Comments: Baseline for enoxaparin therapy IF NOT ALREADY DRAWN.    05/02/19 1441   05/02/19 1156  Novel Coronavirus,NAA,(SEND-OUT TO REF LAB - TAT 24-48 hrs); Hosp Order  (Symptomatic Patients Labs with Precautions )  Once,   STAT    Question Answer Comment  Current symptoms Other (testing not indicated)   Patient immune status Normal      05/02/19 1155          Vitals/Pain Today's Vitals   05/02/19 1411 05/02/19 1415 05/02/19 1430 05/02/19 1445   BP:  127/73 138/90 126/90  Pulse:    89  Resp:    20  Temp:      TempSrc:      SpO2:    94%  PainSc: 5        Isolation Precautions No active isolations  Medications Medications  enoxaparin (LOVENOX) injection 40 mg (has no administration in time range)  sodium chloride flush (NS) 0.9 % injection 3 mL (has no administration in time range)  acetaminophen (TYLENOL) tablet 650 mg (has no administration in time range)    Or  acetaminophen (TYLENOL) suppository 650 mg (has no administration in time range)  ondansetron (ZOFRAN) tablet 4 mg (has no administration in time range)    Or  ondansetron (ZOFRAN) injection 4 mg (has no administration in time range)  0.9 %  sodium chloride infusion (has no administration in time range)  HYDROmorphone (DILAUDID) injection 1 mg (has no administration in time range)  sodium chloride flush (NS) 0.9 % injection 3 mL (3 mLs Intravenous Given 05/02/19 0937)  sodium chloride 0.9 % bolus 1,000 mL (0 mLs Intravenous Stopped 05/02/19 1220)  ondansetron (ZOFRAN) injection 4 mg (4 mg Intravenous Given 05/02/19 0938)  LORazepam (ATIVAN) injection 1 mg (1 mg Intravenous Given 05/02/19 0937)  HYDROmorphone (DILAUDID) injection 0.75 mg (0.75 mg Intravenous Given 05/02/19 0937)  HYDROmorphone (DILAUDID) injection 1 mg (1 mg Intravenous Given 05/02/19 1134)  lactated ringers bolus 1,000 mL (1,000 mLs Intravenous New Bag/Given 05/02/19 1222)  morphine 4 MG/ML injection 8 mg (8 mg Intravenous Given 05/02/19 1247)    Mobility walks Low fall risk   Focused Assessments Gastrointestinal    R Recommendations: See Admitting Provider Note  Report given to:   Additional Notes: Pain control has been main issue.  Severe ABD pain with movement.  Will medicate pt prior to transport.  A&O x4.  VSS

## 2019-05-02 NOTE — ED Triage Notes (Addendum)
From home with severe back and abdominal pain. Recent diagnosis of endometriosis and fibroids. Patient c/o loss of appetite, N/V, and SOB.

## 2019-05-02 NOTE — ED Notes (Signed)
Got patient on the monitor patient is resting with call bell in reach  ?

## 2019-05-03 ENCOUNTER — Inpatient Hospital Stay (HOSPITAL_COMMUNITY): Payer: BC Managed Care – PPO

## 2019-05-03 ENCOUNTER — Encounter (HOSPITAL_COMMUNITY): Payer: Self-pay | Admitting: General Surgery

## 2019-05-03 DIAGNOSIS — N179 Acute kidney failure, unspecified: Secondary | ICD-10-CM

## 2019-05-03 DIAGNOSIS — R1900 Intra-abdominal and pelvic swelling, mass and lump, unspecified site: Secondary | ICD-10-CM

## 2019-05-03 DIAGNOSIS — K56609 Unspecified intestinal obstruction, unspecified as to partial versus complete obstruction: Secondary | ICD-10-CM

## 2019-05-03 LAB — CBC
HCT: 32.3 % — ABNORMAL LOW (ref 36.0–46.0)
Hemoglobin: 10.6 g/dL — ABNORMAL LOW (ref 12.0–15.0)
MCH: 24.3 pg — ABNORMAL LOW (ref 26.0–34.0)
MCHC: 32.8 g/dL (ref 30.0–36.0)
MCV: 74.1 fL — ABNORMAL LOW (ref 80.0–100.0)
Platelets: 317 10*3/uL (ref 150–400)
RBC: 4.36 MIL/uL (ref 3.87–5.11)
RDW: 16.7 % — ABNORMAL HIGH (ref 11.5–15.5)
WBC: 19.2 10*3/uL — ABNORMAL HIGH (ref 4.0–10.5)
nRBC: 0.1 % (ref 0.0–0.2)

## 2019-05-03 LAB — BASIC METABOLIC PANEL
Anion gap: 11 (ref 5–15)
BUN: 48 mg/dL — ABNORMAL HIGH (ref 6–20)
CO2: 26 mmol/L (ref 22–32)
Calcium: 8.5 mg/dL — ABNORMAL LOW (ref 8.9–10.3)
Chloride: 101 mmol/L (ref 98–111)
Creatinine, Ser: 2.03 mg/dL — ABNORMAL HIGH (ref 0.44–1.00)
GFR calc Af Amer: 37 mL/min — ABNORMAL LOW (ref >60)
GFR calc non Af Amer: 32 mL/min — ABNORMAL LOW (ref >60)
Glucose, Bld: 88 mg/dL (ref 70–99)
Potassium: 4.3 mmol/L (ref 3.5–5.1)
Sodium: 138 mmol/L (ref 135–145)

## 2019-05-03 LAB — HIV ANTIBODY (ROUTINE TESTING W REFLEX): HIV Screen 4th Generation wRfx: NONREACTIVE

## 2019-05-03 MED ORDER — FAMOTIDINE IN NACL 20-0.9 MG/50ML-% IV SOLN
20.0000 mg | INTRAVENOUS | Status: DC
  Administered 2019-05-03: 20 mg via INTRAVENOUS
  Filled 2019-05-03: qty 50

## 2019-05-03 MED ORDER — NORETHINDRONE ACETATE 5 MG PO TABS
5.0000 mg | ORAL_TABLET | Freq: Two times a day (BID) | ORAL | Status: DC
  Filled 2019-05-03 (×4): qty 1

## 2019-05-03 MED ORDER — SODIUM CHLORIDE 0.9 % IV SOLN
100.0000 mg | Freq: Two times a day (BID) | INTRAVENOUS | Status: DC
  Administered 2019-05-03 – 2019-05-12 (×20): 100 mg via INTRAVENOUS
  Filled 2019-05-03 (×23): qty 100

## 2019-05-03 MED ORDER — PHENOL 1.4 % MT LIQD
1.0000 | OROMUCOSAL | Status: DC | PRN
  Filled 2019-05-03: qty 177

## 2019-05-03 MED ORDER — PHENAZOPYRIDINE HCL 200 MG PO TABS
200.0000 mg | ORAL_TABLET | Freq: Three times a day (TID) | ORAL | Status: DC
  Filled 2019-05-03 (×3): qty 1

## 2019-05-03 MED ORDER — SODIUM CHLORIDE 0.9 % IV SOLN
2.0000 g | Freq: Three times a day (TID) | INTRAVENOUS | Status: DC
  Administered 2019-05-03 – 2019-05-06 (×9): 2 g via INTRAVENOUS
  Administered 2019-05-06: 1 g via INTRAVENOUS
  Administered 2019-05-07 – 2019-05-13 (×19): 2 g via INTRAVENOUS
  Filled 2019-05-03 (×32): qty 2

## 2019-05-03 MED ORDER — URELLE 81 MG PO TABS
1.0000 | ORAL_TABLET | Freq: Four times a day (QID) | ORAL | Status: DC
  Administered 2019-05-04 – 2019-05-14 (×33): 81 mg via ORAL
  Filled 2019-05-03 (×47): qty 1

## 2019-05-03 MED ORDER — DIATRIZOATE MEGLUMINE & SODIUM 66-10 % PO SOLN
90.0000 mL | Freq: Once | ORAL | Status: AC
  Administered 2019-05-03: 90 mL via NASOGASTRIC
  Filled 2019-05-03: qty 90

## 2019-05-03 MED ORDER — SODIUM CHLORIDE 0.9 % IV SOLN
2.0000 g | Freq: Four times a day (QID) | INTRAVENOUS | Status: DC
  Administered 2019-05-03: 2 g via INTRAVENOUS
  Filled 2019-05-03 (×4): qty 2

## 2019-05-03 NOTE — Consult Note (Signed)
CC: pelvic pain  HPIL 33 yo G0 with suspected endometriosis presents with worsening pelvic pain.  Patient notes long history of pelvic pain and has been followed outpatient at [facility] with more recent consultation with Dr. Kerin Perna at [facility].  Patient has been off and on hormonal suppression.  Patient states difficulty maintaining on hormonal suppression due to severe nausea and inability to hold down medication while on birth control pills.  She also notes menses typically last 3 weeks while on birth control pills with associated abdominal pain dyschezia and dysuria.  Patient states only rarely on a longer term birth control pill she will have "normal cycle".  Off birth control pills patient with terrible abdominal pain and menorrhagia.  After consultation with Dr. Kerin Perna in mid June patient started on combined oral birth control pills.  The intent was to place her on birth control pills prior to surgical resection of endometriosis with fertility sparing technique.  Per patient surgery is scheduled end of August.  Several weeks into those pills her nausea worsened and she recently stopped the birth control pills.  She states this precipitated her most recent pain episode.  Patient notes waking with pain every day and inability to go to work.  Patient notes dysuria with feeling of incomplete emptying.  Patient states poor oral tolerance to food and liquids over the past 2 weeks.  Patient states no real bowel movement x2 weeks but does admit to flatus 2 nights ago with very small stool production.  Patient has not been sexually active in 1 year.  Prior imaging: 2018 u/s: R tubular adnexal mass 11/19: CT: R hemorrhagic cyst 1/20: u/s: R adnexal mass 4.7 x 5.9 x 3.6 cm with central ovarian tissue; L adnexal mass: 7.4 x 4.1 x 4.2 cm    O: Vitals:   05/02/19 1445 05/02/19 1635 05/02/19 2000 05/03/19 0540  BP: 126/90 (!) 142/96 134/85 128/71  Pulse: 89 (!) 106 95 100  Resp: 20  18 20 18   Temp:  98.3 F (36.8 C) 98.5 F (36.9 C) 99.3 F (37.4 C)  TempSrc:  Oral Oral Oral  SpO2: 94%   100%   General: Upset and appears uncomfortable.  Speech is slowed Cardiovascular: Regular rate and rhythm Pulmonary clear to auscultation bilaterally Abdomen: Tympany and distention present.  Tender throughout.  No rebound, no guarding. GU: Deferred Lower extremity: Nontender, no edema Skin: Warm and dry Breast: Pendulous, no skin changes, no dimpling, normal nipples   CBC Latest Ref Rng & Units 05/03/2019 05/02/2019 05/02/2019  WBC 4.0 - 10.5 K/uL 19.2(H) 20.6(H) 22.2(H)  Hemoglobin 12.0 - 15.0 g/dL 10.6(L) 10.4(L) 12.3  Hematocrit 36.0 - 46.0 % 32.3(L) 30.9(L) 36.7  Platelets 150 - 400 K/uL 317 334 384   CT: Ileus vs early SBO; no abnl abdominal or pelvic lymphadenopathy; Reproductive: There is a complex cystic lesion associated with the left adnexa, which measures 5.6 cm in greatest dimension, relatively stable. There is a complex cystic lesion associated with the right adnexa which measures approximately 4 poor in 9 cm, also relatively stable from the prior CT. However, there has been an interval development of a new intermediate density mass in the right pelvis which measures 6.8 x 4.9 cm, image 79/98, sequence 3. A connection to the right adnexa is suspected. The uterus is normal.   A:P 33 year old G0 nonpregnant patient with suspected endometriosis and new enlarging pelvic masses. -Endometriosis.  Patient has struggled with hormonal contraception in the past, mostly due to nausea and  bleeding.  Recommend oral Aygestin if she can tolerate this for endometriosis suppression.  Lupron is a good option for her as well but to avoid initial stimulation would do 2 weeks of Aygestin prior to Lupron.  Discussed with patient she would need long-term hormonal control.  However given the size of the masses and her current pain she will likely need surgery.  Possible superinfection  given her white count and would recommend broad-spectrum antibiotics to cover tubo-ovarian abscess.  Patient with history of dyschezia and dysuria so suspect GI and GU involvement of her endometriosis.  Would consider ureteral stenting and general surgery involvement when going to the OR.  Ideally patient's pain can be controlled during this hospital stay and can set her up for outpatient surgery. -Bowel obstruction.  Currently no bowel sounds and consider repeat imaging and NG tube.  General surgery consult recommended.  If taking patient to the OR for bowel obstruction please call on-call doctor from [facility] for intraoperative evaluation of adnexal masses. -Dysuria.  Unlikely UTI but broad-spectrum antibiotics to cover.  Patient may have endometriosis in her bladder or interstitial cystitis given her long history of dysuria.  Recommend bladder analgesic like Uribel.  -Leukocytosis.  Stress reaction versus superinfection/tubo-ovarian abscess or bowel etiology  -Nausea.  Zofran as needed.  Continue n.p.o. status.  Consider NG tube.  GERD prophylaxis.  -Pain management.  IV pain meds as needed -Elevated creatinine.  IV hydration  Ala Dach 05/03/2019 10:42 AM    1 hour spent on the floor with the patient reviewing history and prior imaging results.  Also coordinating with primary team.

## 2019-05-03 NOTE — Consult Note (Signed)
Gwendolyn Nguyen Gwendolyn Nguyen 04/14/1986  [id number].    Requesting MD: Dr. Gilles Chiquito Chief Complaint/Reason for Consult: SBO with pelvic masses  HPI:  This is a pleasant 33 yo black female who has been dealing with endometriosis for many years and has seen several different physicians for treatments.  She has tried to [employer] as well as multiple OCP.  She is currently followed by [employer].  She was referred to a fertility specialist for further management given the development of multiple pelvic masses consistent with suspect endometriomas and persistent pain.  She is scheduled for schedule with this specialist at the end of August.  In there interim she has been started on 2 OCPs.  Unfortunately these make her incredibly nauseated and she has been vomiting for the last 2 weeks.  She has essentially not been able to keep anything down.  Therefore she has also not had any significant BM in over a week.  Her urine output has become less.  She notes pain urination and pain defecation secondary to her endometriosis.  Due to worsening pelvic pain, she presented to the ED for evaluation where she had a CT scan that revealed now 3 pelvic masses with some mild small bowel dilatation which could represent early/low grade small bowel obstruction vs ileus.  She has been admitted for symptom control.  GYN has seen her and recommended an NGT.  Also recommended abx therapy given elevated WBC just in case she has some component of TOA.  We have been consulted for further evaluation of her scan findings.  ROS: ROS: Please see HPI, otherwise she complains of a sore throat secondary to multiple episodes of emesis as well as hiccups.  All other systems are currently negative.  Family History  Problem Relation Age of Onset   Hyperlipidemia Mother    Hypertension Mother    Obesity Mother    Hyperlipidemia Father    Hypertension Father    Obesity Father     Past Medical History:  Diagnosis Date    Anemia    Back pain    Constipation    Endometriosis    IBS (irritable bowel syndrome)    Low hemoglobin    Multiple food allergies     History reviewed. No pertinent surgical history.  Social History:  reports that she has never smoked. She has never used smokeless tobacco. She reports current alcohol use. No history on file for drug.  Allergies:  Allergies  Allergen Reactions   Gadolinium Derivatives Hives, Itching and Nausea And Vomiting    Pt vomited immediately during injection.  15 minutes later, pt developed hives and itching.    Shellfish Allergy Hives   Sulfa Antibiotics Other (See Comments)    Patient was told to NOT TAKE THIS    Medications Prior to Admission  Medication Sig Dispense Refill   Black Currant Seed Oil 500 MG CAPS Take 1 capsule by mouth daily.     ferrous sulfate 325 (65 FE) MG tablet Take 325 mg by mouth daily with breakfast.     Peppermint Oil (IBGARD PO) Take 1 capsule by mouth daily.     vitamin E 1000 UNIT capsule Take 1,000 Units by mouth daily.       Physical Exam: Blood pressure 137/80, pulse 98, temperature 97.7 F (36.5 C), temperature source Oral, resp. rate 18, SpO2 98 %. General: pleasant, WD, WN black female who is laying in bed in NAD HEENT: head is normocephalic, atraumatic.  Sclera  are noninjected.  PERRL.  Ears and nose without any masses or lesions.  Mouth is pink and moist Heart: regular, rate, and rhythm.  Normal s1,s2. No obvious murmurs, gallops, or rubs noted.  Palpable radial and pedal pulses bilaterally Lungs: CTAB, no wheezes, rhonchi, or rales noted.  Respiratory effort nonlabored Abd: soft, pelvic tenderness throughout lower abdomen, mild bloating, some BS, no masses, hernias, or organomegaly MS: all 4 extremities are symmetrical with no cyanosis, clubbing, or edema. Skin: warm and dry with no masses, lesions, or rashes Psych: A&Ox3 with an appropriate affect.   Results for orders placed or performed  during the hospital encounter of 05/02/19 (from the past 48 hour(s))  Lipase, blood     Status: Abnormal   Collection Time: 05/02/19  9:18 AM  Result Value Ref Range   Lipase 57 (H) 11 - 51 U/L    Comment: Performed at [facility], [place] [address], [place], [place] [address]  Comprehensive metabolic panel     Status: Abnormal   Collection Time: 05/02/19  9:18 AM  Result Value Ref Range   Sodium 134 (L) 135 - 145 mmol/L   Potassium 4.0 3.5 - 5.1 mmol/L   Chloride 92 (L) 98 - 111 mmol/L   CO2 27 22 - 32 mmol/L   Glucose, Bld 131 (H) 70 - 99 mg/dL   BUN 66 (H) 6 - 20 mg/dL   Creatinine, Ser 2.79 (H) 0.44 - 1.00 mg/dL   Calcium 9.0 8.9 - 10.3 mg/dL   Total Protein 8.2 (H) 6.5 - 8.1 g/dL   Albumin 2.8 (L) 3.5 - 5.0 g/dL   AST 19 15 - 41 U/L   ALT 16 0 - 44 U/L   Alkaline Phosphatase 63 38 - 126 U/L   Total Bilirubin 2.1 (H) 0.3 - 1.2 mg/dL   GFR calc non Af Amer 22 (L) >60 mL/min   GFR calc Af Amer 25 (L) >60 mL/min   Anion gap 15 5 - 15    Comment: Performed at [facility], [place] [address] [place], [place] [address]  CBC     Status: Abnormal   Collection Time: 05/02/19  9:18 AM  Result Value Ref Range   WBC 22.2 (H) 4.0 - 10.5 K/uL   RBC 5.04 3.87 - 5.11 MIL/uL   Hemoglobin 12.3 12.0 - 15.0 g/dL   HCT 36.7 36.0 - 46.0 %   MCV 72.8 (L) 80.0 - 100.0 fL   MCH 24.4 (L) 26.0 - 34.0 pg   MCHC 33.5 30.0 - 36.0 g/dL   RDW 16.3 (H) 11.5 - 15.5 %   Platelets 384 150 - 400 K/uL   nRBC 0.1 0.0 - 0.2 %    Comment: Performed at [facility], [place] [address] [place], [place] [address]  I-Stat beta hCG blood, ED     Status: None   Collection Time: 05/02/19  9:23 AM  Result Value Ref Range   I-stat hCG, quantitative <5.0 <5 mIU/mL   Comment 3            Comment:   GEST. AGE      CONC.  (mIU/mL)   <=1 WEEK        5 - 50     2 WEEKS       50 - 500     3 WEEKS       100 - 10,000     4 WEEKS     1,000 - 30,000  FEMALE AND NON-PREGNANT FEMALE:     LESS THAN 5  mIU/mL   Urinalysis, Routine w reflex microscopic     Status: Abnormal   Collection Time: 05/02/19  1:00 PM  Result Value Ref Range   Color, Urine AMBER (A) YELLOW    Comment: BIOCHEMICALS MAY BE AFFECTED BY COLOR   APPearance HAZY (A) CLEAR   Specific Gravity, Urine 1.025 1.005 - 1.030   pH 5.0 5.0 - 8.0   Glucose, UA NEGATIVE NEGATIVE mg/dL   Hgb urine dipstick NEGATIVE NEGATIVE   Bilirubin Urine NEGATIVE NEGATIVE   Ketones, ur NEGATIVE NEGATIVE mg/dL   Protein, ur 30 (A) NEGATIVE mg/dL   Nitrite NEGATIVE NEGATIVE   Leukocytes,Ua NEGATIVE NEGATIVE   RBC / HPF 0-5 0 - 5 RBC/hpf   WBC, UA 0-5 0 - 5 WBC/hpf   Bacteria, UA NONE SEEN NONE SEEN   Squamous Epithelial / LPF 0-5 0 - 5    Comment: Performed at [facility], [place] [address], [place], [place] [address]  HIV antibody (Routine Testing)     Status: None   Collection Time: 05/02/19  4:45 PM  Result Value Ref Range   HIV Screen 4th Generation wRfx Non Reactive Non Reactive    Comment: (NOTE) Performed At: [facility] [place], [place] [id number] Rush Farmer MD HC:[id number]   CBC with Differential/Platelet     Status: Abnormal   Collection Time: 05/02/19  4:45 PM  Result Value Ref Range   WBC 20.6 (H) 4.0 - 10.5 K/uL   RBC 4.24 3.87 - 5.11 MIL/uL   Hemoglobin 10.4 (L) 12.0 - 15.0 g/dL   HCT 30.9 (L) 36.0 - 46.0 %   MCV 72.9 (L) 80.0 - 100.0 fL   MCH 24.5 (L) 26.0 - 34.0 pg   MCHC 33.7 30.0 - 36.0 g/dL   RDW 16.4 (H) 11.5 - 15.5 %   Platelets 334 150 - 400 K/uL   nRBC 0.1 0.0 - 0.2 %   Neutrophils Relative % 69 %   Neutro Abs 14.4 (H) 1.7 - 7.7 K/uL   Lymphocytes Relative 8 %   Lymphs Abs 1.6 0.7 - 4.0 K/uL   Monocytes Relative 13 %   Monocytes Absolute 2.7 (H) 0.1 - 1.0 K/uL   Eosinophils Relative 1 %   Eosinophils Absolute 0.1 0.0 - 0.5 K/uL   Basophils Relative 1 %   Basophils Absolute 0.2 (H) 0.0 - 0.1 K/uL   WBC Morphology DOHLE BODIES     Comment: MILD LEFT SHIFT (1-5%  METAS, OCC MYELO, OCC BANDS) TOXIC GRANULATION    Immature Granulocytes 8 %   Abs Immature Granulocytes 1.66 (H) 0.00 - 0.07 K/uL    Comment: Performed at [facility], [place] [address] [place], [place] [address]  Basic metabolic panel     Status: Abnormal   Collection Time: 05/03/19  1:26 AM  Result Value Ref Range   Sodium 138 135 - 145 mmol/L   Potassium 4.3 3.5 - 5.1 mmol/L   Chloride 101 98 - 111 mmol/L   CO2 26 22 - 32 mmol/L   Glucose, Bld 88 70 - 99 mg/dL   BUN 48 (H) 6 - 20 mg/dL   Creatinine, Ser 2.03 (H) 0.44 - 1.00 mg/dL   Calcium 8.5 (L) 8.9 - 10.3 mg/dL   GFR calc non Af Amer 32 (L) >60 mL/min   GFR calc Af Amer 37 (L) >60 mL/min   Anion gap 11 5 - 15  Comment: Performed at [facility], [place] [address] [place], [place] [address]  CBC     Status: Abnormal   Collection Time: 05/03/19  1:26 AM  Result Value Ref Range   WBC 19.2 (H) 4.0 - 10.5 K/uL   RBC 4.36 3.87 - 5.11 MIL/uL   Hemoglobin 10.6 (L) 12.0 - 15.0 g/dL   HCT 32.3 (L) 36.0 - 46.0 %   MCV 74.1 (L) 80.0 - 100.0 fL   MCH 24.3 (L) 26.0 - 34.0 pg   MCHC 32.8 30.0 - 36.0 g/dL   RDW 16.7 (H) 11.5 - 15.5 %   Platelets 317 150 - 400 K/uL   nRBC 0.1 0.0 - 0.2 %    Comment: Performed at [facility] [address], [place], [place] [address]   Ct Abdomen Pelvis Wo Contrast  Result Date: 05/02/2019 CLINICAL DATA:  Right-sided back pain. EXAM: CT ABDOMEN AND PELVIS WITHOUT CONTRAST TECHNIQUE: Multidetector CT imaging of the abdomen and pelvis was performed following the standard protocol without IV contrast. COMPARISON:  Abdominal CT September 09, 2018, pelvic MRI December 02, 2018 FINDINGS: Lower chest: Right middle and lower lobe atelectasis versus scarring. 4 mm ground-Nguyen nodule in the right upper lobe, image 2/26, sequence 7. Hepatobiliary: No focal liver abnormality is seen. No gallstones, gallbladder wall thickening, or biliary dilatation. Pancreas: Unremarkable. No pancreatic ductal  dilatation or surrounding inflammatory changes. Spleen: Normal in size without focal abnormality. Adrenals/Urinary Tract: Adrenal glands are unremarkable. Kidneys are normal, without renal calculi, focal lesion, or hydronephrosis. Bladder is unremarkable. Stomach/Bowel: Numerous prominent, but sub pathologic by CT criteria small bowel loops with gas fluid levels. The colon is decompressed. Vascular/Lymphatic: No significant vascular findings are present. No enlarged abdominal or pelvic lymph nodes. Shotty retroperitoneal lymph nodes, sub pathologic by CT criteria and nonspecific. Reproductive: There is a complex cystic lesion associated with the left adnexa, which measures 5.6 cm in greatest dimension, relatively stable. There is a complex cystic lesion associated with the right adnexa which measures approximately 4 poor in 9 cm, also relatively stable from the prior CT. However, there has been an interval development of a new intermediate density mass in the right pelvis which measures 6.8 x 4.9 cm, image 79/98, sequence 3. A connection to the right adnexa is suspected. The uterus is normal. Other: No abdominal wall hernia or abnormality. No abdominopelvic ascites. Musculoskeletal: Ill-defined sclerotic lesion within the body of the sternum measures 9 mm, image 7/98, sequence 3. 2 smaller sclerotic foci are present within the left ilium, measuring 4 and 5 mm, image 66/98, sequence 3. IMPRESSION: 1. Numerous mildly distended, technically sub pathologic by CT criteria, small bowel loops with gas fluid levels. The colon is decompressed. The findings are nonspecific but may represent early/low-grade small bowel obstruction versus ileus. 2. Known bilateral complex cystic adnexal masses, with interval development of a third 6.8 cm intermediate density mass in the right pelvis, which likely arises in the right adnexa. Further evaluation with pelvic ultrasound or MRI without and with contrast, and OBGYN consultation may be  considered. 3. Ill-defined sclerotic foci within the body of the sternum and left ilium, which may represent bone islands, however osseous metastatic disease cannot be excluded. 4. Right middle and lower lobe atelectasis versus scarring. 4 mm ground-Nguyen nodule in the right upper lobe. No follow-up needed if patient is low-risk. Non-contrast chest CT can be considered in 12 months if patient is high-risk. This recommendation follows the consensus statement: Guidelines for Management of  Incidental Pulmonary Nodules Detected on CT Images: From the Fleischner Society 2017; Radiology 2017; 284:228-243. These results were called by telephone at the time of interpretation on 05/02/2019 at 11:14 am to Dr. Virgel Manifold , who verbally acknowledged these results. Electronically Signed   By: Fidela Salisbury M.D.   On: 05/02/2019 11:17   Dg Abd 2 Views  Result Date: 05/03/2019 CLINICAL DATA:  Bilateral lower abdominal pain and low back spasms causing shortness of breath. EXAM: ABDOMEN - 2 VIEW COMPARISON:  CT abdomen dated 05/02/2019. FINDINGS: Persistent gaseous distention of small-bowel loops within the central abdomen, stable to increased compared to yesterday's CT, with associated air-fluid levels suggesting mechanical obstruction. Suspect some new distension of the large bowel. No evidence of free intraperitoneal air. No evidence of renal or ureteral calculi. IMPRESSION: Evidence of bowel obstruction, possibly large bowel as well as small bowel involvement. These results will be called to the ordering clinician or representative by the Radiologist Assistant, and communication documented in the PACS or zVision Dashboard. Electronically Signed   By: Franki Cabot M.D.   On: 05/03/2019 13:03      Assessment/Plan Endometriosis Multiple pelvic masses ARF, secondary to dehydration  Nausea, vomiting, partial SBO vs ileus The patient is noted to have 3 masses in her pelvis.  These are suspected to be  endometriomas.  She does have some other CT scan findings that are concerning but may just be bony islands.  She has some dilated small bowel loops noted on her scan along with some N/V and minimal flatus/BMs.  However, when discussing with the patient it seems as if her nausea and vomiting are related more to her OCP medications than likely from an obstruction.  She currently states with her nausea medication and not on her OCP that her nausea is improved.  It is possible that these masses are causing a narrowing or partial obstruction in the pelvis.  She has an NGT in place already.  We will order the SBO protocol to follow the contrast.  If this passes, then this can be removed and diet advanced as tolerated on an aggressive bowel regimen to keep her stool soft and able to pass.  Defer to GYN on how to treat her endometriosis given her OCPs cause such significant N/V.  We will follow the patient with you and available if GYN decides to take her to the OR.     FEN - NPO/NGT/IVF VTE - heparin ID - cefoxitin, doxy  Henreitta Cea, [facility] 05/03/2019, 2:35 PM Pager: [phone]

## 2019-05-03 NOTE — Progress Notes (Signed)
PHARMACY NOTE:  ANTIMICROBIAL RENAL DOSAGE ADJUSTMENT  Current antimicrobial regimen includes a mismatch between antimicrobial dosage and estimated renal function.  As per policy approved by the Pharmacy & Therapeutics and Medical Executive Committees, the antimicrobial dosage will be adjusted accordingly.  Current antimicrobial dosage:  Cefoxitin 2g every 6 hours.  Indication: Endometrial masses  Renal Function: Patient is in AKI with renal function improving.  Estimated Creatinine Clearance: 43 mL/min (A) (by C-G formula based on SCr of 2.03 mg/dL (H)).    Antimicrobial dosage has been changed to: Cefoxitin 2g every 8 hours due to current renal function.   Additional comments:  Thank you for allowing pharmacy to be a part of this patient's care.  Sherren Kerns, PharmD PGY1 Acute Care Pharmacy Resident [phone] 05/03/2019 6:07 PM

## 2019-05-03 NOTE — Progress Notes (Signed)
  Date: 05/03/2019  Patient name: Gwendolyn Nguyen  Medical record number: [id number]  Date of birth: January 10, 1986   I have seen and evaluated this patient and I have discussed the plan of care with the house staff. Please see Dr. Lonzo Candy note for complete details. I concur with his findings and plan.   Sid Falcon, MD 05/03/2019, 8:41 PM

## 2019-05-03 NOTE — Progress Notes (Signed)
   Subjective: Patient pain controlled 5/10 with IV dilaudid q2hrs. No bowel movement overnight and no flatus. Nausea controlled with zofran. Denies vomiting.   Objective:  Vital signs in last 24 hours: Vitals:   05/02/19 1635 05/02/19 2000 05/03/19 0540 05/03/19 1325  BP: (!) 142/96 134/85 128/71 137/80  Pulse: (!) 106 95 100 98  Resp: 18 20 18    Temp: 98.3 F (36.8 C) 98.5 F (36.9 C) 99.3 F (37.4 C) 97.7 F (36.5 C)  TempSrc: Oral Oral Oral Oral  SpO2:   100% 98%   Physical Exam Constitutional:      Appearance: She is ill-appearing.  HENT:     Mouth/Throat:     Mouth: Mucous membranes are dry.  Cardiovascular:     Rate and Rhythm: Regular rhythm. Tachycardia present.  Pulmonary:     Effort: Pulmonary effort is normal.     Breath sounds: Normal breath sounds.  Abdominal:     General: There is distension.     Comments: Absent bowel sounds   Skin:    General: Skin is warm and dry.  Neurological:     Mental Status: She is alert.    Abd xray IMPRESSION: Evidence of bowel obstruction, possibly large bowel as well as small bowel involvement.  Assessment/Plan:  Active Problems:   Pelvic mass  Active Problems:   Pelvic mass  Pelvic mass Small bowel obstruction Patient abdomen distended on exam with absent bowel sounds. Abd xray showing possible bowel obstruction , previously seen on CT. Concerning that patient is not passing flatus and absent bowel sounds. Will have NG tube placed and consult Gen Surg for recommendations. OB/Gyn following and plan to recommend hormonal control and surgery at some point.  - appreciate OB/Gyn recommendations - place NG tube - consult general surgery  - PRN dilaudid for pain - start uribel - Zofran PRN  - f/u GC/Chlamydia   AKI On admission Creatinine 2.79. Baseline 09/09/2018 1.01. Improving with IV fluids.  - c/w IV fluids - BMP   Dispo: Anticipated discharge in approximately 3-4  day(s).   Tamsen Snider, MD PGY1   [phone]

## 2019-05-04 ENCOUNTER — Inpatient Hospital Stay (HOSPITAL_COMMUNITY): Payer: BC Managed Care – PPO

## 2019-05-04 LAB — CBC
HCT: 30 % — ABNORMAL LOW (ref 36.0–46.0)
Hemoglobin: 9.8 g/dL — ABNORMAL LOW (ref 12.0–15.0)
MCH: 24.4 pg — ABNORMAL LOW (ref 26.0–34.0)
MCHC: 32.7 g/dL (ref 30.0–36.0)
MCV: 74.8 fL — ABNORMAL LOW (ref 80.0–100.0)
Platelets: 305 10*3/uL (ref 150–400)
RBC: 4.01 MIL/uL (ref 3.87–5.11)
RDW: 17.4 % — ABNORMAL HIGH (ref 11.5–15.5)
WBC: 24.6 10*3/uL — ABNORMAL HIGH (ref 4.0–10.5)
nRBC: 0.2 % (ref 0.0–0.2)

## 2019-05-04 LAB — GC/CHLAMYDIA PROBE AMP (~~LOC~~) NOT AT ARMC
Chlamydia: NEGATIVE
Neisseria Gonorrhea: NEGATIVE

## 2019-05-04 LAB — BASIC METABOLIC PANEL
Anion gap: 9 (ref 5–15)
BUN: 27 mg/dL — ABNORMAL HIGH (ref 6–20)
CO2: 24 mmol/L (ref 22–32)
Calcium: 8.8 mg/dL — ABNORMAL LOW (ref 8.9–10.3)
Chloride: 110 mmol/L (ref 98–111)
Creatinine, Ser: 1.9 mg/dL — ABNORMAL HIGH (ref 0.44–1.00)
GFR calc Af Amer: 40 mL/min — ABNORMAL LOW (ref >60)
GFR calc non Af Amer: 35 mL/min — ABNORMAL LOW (ref >60)
Glucose, Bld: 113 mg/dL — ABNORMAL HIGH (ref 70–99)
Potassium: 5.1 mmol/L (ref 3.5–5.1)
Sodium: 143 mmol/L (ref 135–145)

## 2019-05-04 LAB — NOVEL CORONAVIRUS, NAA (HOSP ORDER, SEND-OUT TO REF LAB; TAT 18-24 HRS): SARS-CoV-2, NAA: NOT DETECTED

## 2019-05-04 MED ORDER — KETOROLAC TROMETHAMINE 30 MG/ML IJ SOLN
15.0000 mg | Freq: Three times a day (TID) | INTRAMUSCULAR | Status: DC
  Administered 2019-05-04 – 2019-05-08 (×12): 15 mg via INTRAVENOUS
  Filled 2019-05-04 (×10): qty 1

## 2019-05-04 MED ORDER — MEDROXYPROGESTERONE ACETATE 150 MG/ML IM SUSP
150.0000 mg | Freq: Once | INTRAMUSCULAR | Status: DC
  Filled 2019-05-04: qty 1

## 2019-05-04 MED ORDER — MENTHOL 3 MG MT LOZG: 1.0000 | LOZENGE | OROMUCOSAL | Status: DC | PRN

## 2019-05-04 MED ORDER — PANTOPRAZOLE SODIUM 40 MG IV SOLR
40.0000 mg | Freq: Two times a day (BID) | INTRAVENOUS | Status: DC
  Administered 2019-05-04 – 2019-05-13 (×18): 40 mg via INTRAVENOUS
  Filled 2019-05-04 (×19): qty 40

## 2019-05-04 NOTE — Progress Notes (Signed)
No vomiting since being in hospital, pain continues - mostly feels in her back and then at times feels bloated; pain meds help but don't last long; +flatus Denies fever/chills/sweats  Temp:  [97.7 F (36.5 C)-98.9 F (37.2 C)] 98.9 F (37.2 C) (07/15 0507) Pulse Rate:  [98-116] 106 (07/15 0507) Resp:  [17-18] 18 (07/15 0507) BP: (135-141)/(80-88) 141/85 (07/15 0507) SpO2:  [98 %-100 %] 100 % (07/15 0507) Weight:  [87.5 kg] 87.5 kg (07/14 1704)  A&ox3, appears uncomfortable rrr ctab Abd: soft, nd; generalized tenderness, decreased/scant bs; +guarding, no rebound LE: no edema, nt bilat  CBC Latest Ref Rng & Units 05/04/2019 05/03/2019 05/02/2019  WBC 4.0 - 10.5 K/uL 24.6(H) 19.2(H) 20.6(H)  Hemoglobin 12.0 - 15.0 g/dL 9.8(L) 10.6(L) 10.4(L)  Hematocrit 36.0 - 46.0 % 30.0(L) 32.3(L) 30.9(L)  Platelets 150 - 400 K/uL 305 317 334   CMP Latest Ref Rng & Units 05/04/2019 05/03/2019 05/02/2019  Glucose 70 - 99 mg/dL 113(H) 88 131(H)  BUN 6 - 20 mg/dL 27(H) 48(H) 66(H)  Creatinine 0.44 - 1.00 mg/dL 1.90(H) 2.03(H) 2.79(H)  Sodium 135 - 145 mmol/L 143 138 134(L)  Potassium 3.5 - 5.1 mmol/L 5.1 4.3 4.0  Chloride 98 - 111 mmol/L 110 101 92(L)  CO2 22 - 32 mmol/L 24 26 27   Calcium 8.9 - 10.3 mg/dL 8.8(L) 8.5(L) 9.0  Total Protein 6.5 - 8.1 g/dL - - 8.2(H)  Total Bilirubin 0.3 - 1.2 mg/dL - - 2.1(H)  Alkaline Phos 38 - 126 U/L - - 63  AST 15 - 41 U/L - - 19  ALT 0 - 44 U/L - - 16   Imaging:  Ct Abdomen Pelvis Wo Contrast  Result Date: 05/02/2019 CLINICAL DATA:  Right-sided back pain. EXAM: CT ABDOMEN AND PELVIS WITHOUT CONTRAST TECHNIQUE: Multidetector CT imaging of the abdomen and pelvis was performed following the standard protocol without IV contrast. COMPARISON:  Abdominal CT September 09, 2018, pelvic MRI December 02, 2018 FINDINGS: Lower chest: Right middle and lower lobe atelectasis versus scarring. 4 mm ground-glass nodule in the right upper lobe, image 2/26, sequence 7.  Hepatobiliary: No focal liver abnormality is seen. No gallstones, gallbladder wall thickening, or biliary dilatation. Pancreas: Unremarkable. No pancreatic ductal dilatation or surrounding inflammatory changes. Spleen: Normal in size without focal abnormality. Adrenals/Urinary Tract: Adrenal glands are unremarkable. Kidneys are normal, without renal calculi, focal lesion, or hydronephrosis. Bladder is unremarkable. Stomach/Bowel: Numerous prominent, but sub pathologic by CT criteria small bowel loops with gas fluid levels. The colon is decompressed. Vascular/Lymphatic: No significant vascular findings are present. No enlarged abdominal or pelvic lymph nodes. Shotty retroperitoneal lymph nodes, sub pathologic by CT criteria and nonspecific. Reproductive: There is a complex cystic lesion associated with the left adnexa, which measures 5.6 cm in greatest dimension, relatively stable. There is a complex cystic lesion associated with the right adnexa which measures approximately 4 poor in 9 cm, also relatively stable from the prior CT. However, there has been an interval development of a new intermediate density mass in the right pelvis which measures 6.8 x 4.9 cm, image 79/98, sequence 3. A connection to the right adnexa is suspected. The uterus is normal. Other: No abdominal wall hernia or abnormality. No abdominopelvic ascites. Musculoskeletal: Ill-defined sclerotic lesion within the body of the sternum measures 9 mm, image 7/98, sequence 3. 2 smaller sclerotic foci are present within the left ilium, measuring 4 and 5 mm, image 66/98, sequence 3. IMPRESSION: 1. Numerous mildly distended, technically sub pathologic by CT criteria, small  bowel loops with gas fluid levels. The colon is decompressed. The findings are nonspecific but may represent early/low-grade small bowel obstruction versus ileus. 2. Known bilateral complex cystic adnexal masses, with interval development of a third 6.8 cm intermediate density mass in  the right pelvis, which likely arises in the right adnexa. Further evaluation with pelvic ultrasound or MRI without and with contrast, and OBGYN consultation may be considered. 3. Ill-defined sclerotic foci within the body of the sternum and left ilium, which may represent bone islands, however osseous metastatic disease cannot be excluded. 4. Right middle and lower lobe atelectasis versus scarring. 4 mm ground-glass nodule in the right upper lobe. No follow-up needed if patient is low-risk. Non-contrast chest CT can be considered in 12 months if patient is high-risk. This recommendation follows the consensus statement: Guidelines for Management of Incidental Pulmonary Nodules Detected on CT Images: From the Fleischner Society 2017; Radiology 2017; 284:228-243. These results were called by telephone at the time of interpretation on 05/02/2019 at 11:14 am to Dr. Virgel Manifold , who verbally acknowledged these results. Electronically Signed   By: Fidela Salisbury M.D.   On: 05/02/2019 11:17    Result Date: 05/03/2019 CLINICAL DATA:  Nasogastric tube placement. EXAM: ABDOMEN - 1 VIEW COMPARISON:  Earlier today. FINDINGS: Interval nasogastric tube with its tip in the proximal stomach and side hole in the proximal to mid stomach. No significant change in multiple dilated small bowel loops. Poor inspiration without significant change right basilar volume loss and airspace opacity compatible with atelectasis. Minimal scoliosis. IMPRESSION: 1. Nasogastric tube tip in the proximal stomach and side hole in the proximal to mid stomach. 2. Stable small bowel ileus or partial obstruction. Electronically Signed   By: Claudie Revering M.D.   On: 05/03/2019 15:06   Dg Abd 2 Views  Result Date: 05/03/2019 CLINICAL DATA:  Bilateral lower abdominal pain and low back spasms causing shortness of breath. EXAM: ABDOMEN - 2 VIEW COMPARISON:  CT abdomen dated 05/02/2019. FINDINGS: Persistent gaseous distention of small-bowel loops  within the central abdomen, stable to increased compared to yesterday's CT, with associated air-fluid levels suggesting mechanical obstruction. Suspect some new distension of the large bowel. No evidence of free intraperitoneal air. No evidence of renal or ureteral calculi. IMPRESSION: Evidence of bowel obstruction, possibly large bowel as well as small bowel involvement. These results will be called to the ordering clinician or representative by the Radiologist Assistant, and communication documented in the PACS or zVision Dashboard. Electronically Signed   By: Franki Cabot M.D.   On: 05/03/2019 13:03   Dg Abd Portable 1v-small Bowel Obstruction Protocol-initial, 8 Hr Delay  Result Date: 05/04/2019 CLINICAL DATA:  Small bowel protocol, 8 hour delay EXAM: PORTABLE ABDOMEN - 1 VIEW COMPARISON:  05/03/2019, 05/02/2019 FINDINGS: Esophageal tube tip is in the left upper quadrant. Persistent dilatation of primarily small bowel, measuring up to 4.6 cm. Dilute contrast within dilated small bowel loops. No definitive contrast within the colon. Curvilinear metallic opacity over the central abdomen may represent external artifact IMPRESSION: Findings consistent with small bowel obstruction. Dilute contrast within dilated loops of small bowel. Electronically Signed   By: Donavan Foil M.D.   On: 05/04/2019 02:20    A/P: 33 y/o G0 1. SBO vs ileus - general surgery following, possible partial sbo and further imaging later today; NG tube placed 2. Pelvic masses/endometriosis - started on aygestin but unable to have oral management at this time, will go ahead and give dmpa in the event  that surgical management does not occur during admission; if surgical management for SBO is required per general surgery, would then plan excision of pelvic masses at that time, otherwise would plan surgical management at a later date.  Will continue to follow patient closely. 3. Possible toa vs leukocytosis - would contin current abx  regimen, pt afebrile (doxy/mefoxin) 4. Renal failure - likely d/t dehydration, improving  Plan reviewed with primary team.

## 2019-05-04 NOTE — Progress Notes (Signed)
Paged as patient requested the removal of her NG tube. Upon arrival at bedside the patient could be seen removing her NG tube. She paused long enough for me to gather that she was experiencing a sensation of chocking and severe neck/head pain. The risks of removal including possibly increased severity of her bowel obstruction, necrotic bowel development, increased possibility of needing surgery and potentially even death were explained as were the benefits of leaving the NG tube in position. In addition, she was advised that she may need to have it replaced if she worsens after removal. Upon completion of the risks vs benefits analysis she stated that the tube would either be removed by the nurse or that she will remove it. At that time she resumed the process of slowlyg removing the gastric tube. I asked that the nurse assist the patient with this to avoid further injury.   Kathi Ludwig, MD [facility] Internal Medicine, PGY-3 Pager # [phone]

## 2019-05-04 NOTE — Progress Notes (Signed)
NGT clamped this pm for trials but pt did not feel like getting up to ambulate, c/o pain and spasms especially in the back.

## 2019-05-04 NOTE — Progress Notes (Signed)
   Subjective: Pt continued to be in pain this morning. NG tube in place. Report flatulence overnight, no bowel movement.   Objective:  Vital signs in last 24 hours: Vitals:   05/03/19 1325 05/03/19 1704 05/03/19 2118 05/04/19 0507  BP: 137/80  135/88 (!) 141/85  Pulse: 98  (!) 116 (!) 106  Resp:   17 18  Temp: 97.7 F (36.5 C)  98.7 F (37.1 C) 98.9 F (37.2 C)  TempSrc: Oral  Oral Oral  SpO2: 98%  98% 100%  Weight:  87.5 kg    Height:  5\' 4"  (1.626 m)     Physical Exam Constitutional:      Appearance: She is ill-appearing.  Cardiovascular:     Rate and Rhythm: Normal rate and regular rhythm.     Pulses: Normal pulses.  Pulmonary:     Effort: Pulmonary effort is normal.     Breath sounds: Normal breath sounds.  Abdominal:     General: Bowel sounds are normal. There is distension.     Tenderness: There is guarding.  Neurological:     Mental Status: She is alert.     Assessment/Plan:  Active Problems:   Pelvic mass   Small bowel obstruction (HCC)  Pelvic mass Small bowel obstruction Patient abdomen distended on exam with bowel sounds present today and flatulence over night. Follow through study showed small bowel obstruction. NGT 700cc overnight.  - appreciate OB/Gyn recommendations - appreciate general surgery recommendations - PRN dilaudid for pain - Toradol  - Zofran PRN  - Per Gyn - DMPA, npo can not start aygestin  AKI Creatinine 2.79>2.03>1.9. Baseline 09/09/2018 1.01. Improving with IV fluids.  - c/w IV fluids - BMP  Diet:NPO VTE: Heparin Code:Full  Dispo: Anticipated discharge in approximately 3-4 day(s).   Tamsen Snider, MD PGY1  [phone]

## 2019-05-04 NOTE — Progress Notes (Signed)
[facility] Progress Note      Assessment/Plan Endometriosis Multiple pelvic masses ARF, secondary to dehydration  Partial SBO vs ileus - NGT with dark brown/green output, 700cc in last 24hrs - 8hr delay film showed contrast in small bowel only, 24hr delay film today at 1600 - having flatus - hopefully this resolves without the need for surgery. If surgery is indicated will coordinate with GYN.  - we will follow  FEN: NGT, NPO, ice chips okay VTE: SCD's, heparin ID: Doxy & Mefoxin 07/14>> Foley: none Follow up: TBD    LOS: 2 days    Subjective: CC: abdominal pain  Nausea only with the dilaudid. The dilaudid helps but does not last long. She is having flatus. No BM.   Objective: Vital signs in last 24 hours: Temp:  [97.7 F (36.5 C)-98.9 F (37.2 C)] 98.9 F (37.2 C) (07/15 0507) Pulse Rate:  [98-116] 106 (07/15 0507) Resp:  [17-18] 18 (07/15 0507) BP: (135-141)/(80-88) 141/85 (07/15 0507) SpO2:  [98 %-100 %] 100 % (07/15 0507) Weight:  [87.5 kg] 87.5 kg (07/14 1704) Last BM Date: 05/02/19  Intake/Output from previous day: 07/14 0701 - 07/15 0700 In: 2703.3 [I.V.:1940.1; IV Piggyback:763.2] Out: 1300 [Urine:600; Emesis/NG output:700] Intake/Output this shift: No intake/output data recorded.  PE: Gen:  Alert, NAD, appears very uncomfortable Pulm:  Rate and effort normal Abd: Soft, ND, few BS and tinkling, generalized TTP with guarding. No peritonitis  Skin: no rashes noted, warm and dry   Anti-infectives: Anti-infectives (From admission, onward)   Start     Dose/Rate Route Frequency Ordered Stop   05/03/19 2200  cefOXitin (MEFOXIN) 2 g in sodium chloride 0.9 % 100 mL IVPB     2 g 200 mL/hr over 30 Minutes Intravenous Every 8 hours 05/03/19 1843     05/03/19 1200  cefOXitin (MEFOXIN) 2 g in sodium chloride 0.9 % 100 mL IVPB  Status:  Discontinued     2 g 200 mL/hr over 30 Minutes Intravenous Every 6 hours 05/03/19 1059 05/03/19 1843    05/03/19 1100  doxycycline (VIBRAMYCIN) 100 mg in sodium chloride 0.9 % 250 mL IVPB     100 mg 125 mL/hr over 120 Minutes Intravenous Every 12 hours 05/03/19 1059        Lab Results:  Recent Labs    05/03/19 0126 05/04/19 0811  WBC 19.2* 24.6*  HGB 10.6* 9.8*  HCT 32.3* 30.0*  PLT 317 305   BMET Recent Labs    05/03/19 0126 05/04/19 0811  NA 138 143  K 4.3 5.1  CL 101 110  CO2 26 24  GLUCOSE 88 113*  BUN 48* 27*  CREATININE 2.03* 1.90*  CALCIUM 8.5* 8.8*   PT/INR No results for input(s): LABPROT, INR in the last 72 hours. CMP     Component Value Date/Time   NA 143 05/04/2019 0811   K 5.1 05/04/2019 0811   CL 110 05/04/2019 0811   CO2 24 05/04/2019 0811   GLUCOSE 113 (H) 05/04/2019 0811   BUN 27 (H) 05/04/2019 0811   CREATININE 1.90 (H) 05/04/2019 0811   CALCIUM 8.8 (L) 05/04/2019 0811   PROT 8.2 (H) 05/02/2019 0918   ALBUMIN 2.8 (L) 05/02/2019 0918   AST 19 05/02/2019 0918   ALT 16 05/02/2019 0918   ALKPHOS 63 05/02/2019 0918   BILITOT 2.1 (H) 05/02/2019 0918   GFRNONAA 35 (L) 05/04/2019 0811   GFRAA 40 (L) 05/04/2019 0811   Lipase     Component Value Date/Time  LIPASE 57 (H) 05/02/2019 0918    Studies/Results: Ct Abdomen Pelvis Wo Contrast  Result Date: 05/02/2019 CLINICAL DATA:  Right-sided back pain. EXAM: CT ABDOMEN AND PELVIS WITHOUT CONTRAST TECHNIQUE: Multidetector CT imaging of the abdomen and pelvis was performed following the standard protocol without IV contrast. COMPARISON:  Abdominal CT September 09, 2018, pelvic MRI December 02, 2018 FINDINGS: Lower chest: Right middle and lower lobe atelectasis versus scarring. 4 mm ground-glass nodule in the right upper lobe, image 2/26, sequence 7. Hepatobiliary: No focal liver abnormality is seen. No gallstones, gallbladder wall thickening, or biliary dilatation. Pancreas: Unremarkable. No pancreatic ductal dilatation or surrounding inflammatory changes. Spleen: Normal in size without focal  abnormality. Adrenals/Urinary Tract: Adrenal glands are unremarkable. Kidneys are normal, without renal calculi, focal lesion, or hydronephrosis. Bladder is unremarkable. Stomach/Bowel: Numerous prominent, but sub pathologic by CT criteria small bowel loops with gas fluid levels. The colon is decompressed. Vascular/Lymphatic: No significant vascular findings are present. No enlarged abdominal or pelvic lymph nodes. Shotty retroperitoneal lymph nodes, sub pathologic by CT criteria and nonspecific. Reproductive: There is a complex cystic lesion associated with the left adnexa, which measures 5.6 cm in greatest dimension, relatively stable. There is a complex cystic lesion associated with the right adnexa which measures approximately 4 poor in 9 cm, also relatively stable from the prior CT. However, there has been an interval development of a new intermediate density mass in the right pelvis which measures 6.8 x 4.9 cm, image 79/98, sequence 3. A connection to the right adnexa is suspected. The uterus is normal. Other: No abdominal wall hernia or abnormality. No abdominopelvic ascites. Musculoskeletal: Ill-defined sclerotic lesion within the body of the sternum measures 9 mm, image 7/98, sequence 3. 2 smaller sclerotic foci are present within the left ilium, measuring 4 and 5 mm, image 66/98, sequence 3. IMPRESSION: 1. Numerous mildly distended, technically sub pathologic by CT criteria, small bowel loops with gas fluid levels. The colon is decompressed. The findings are nonspecific but may represent early/low-grade small bowel obstruction versus ileus. 2. Known bilateral complex cystic adnexal masses, with interval development of a third 6.8 cm intermediate density mass in the right pelvis, which likely arises in the right adnexa. Further evaluation with pelvic ultrasound or MRI without and with contrast, and OBGYN consultation may be considered. 3. Ill-defined sclerotic foci within the body of the sternum and left  ilium, which may represent bone islands, however osseous metastatic disease cannot be excluded. 4. Right middle and lower lobe atelectasis versus scarring. 4 mm ground-glass nodule in the right upper lobe. No follow-up needed if patient is low-risk. Non-contrast chest CT can be considered in 12 months if patient is high-risk. This recommendation follows the consensus statement: Guidelines for Management of Incidental Pulmonary Nodules Detected on CT Images: From the Fleischner Society 2017; Radiology 2017; 284:228-243. These results were called by telephone at the time of interpretation on 05/02/2019 at 11:14 am to Dr. Virgel Manifold , who verbally acknowledged these results. Electronically Signed   By: Fidela Salisbury M.D.   On: 05/02/2019 11:17   Dg Abd 1 View  Result Date: 05/03/2019 CLINICAL DATA:  Nasogastric tube placement. EXAM: ABDOMEN - 1 VIEW COMPARISON:  Earlier today. FINDINGS: Interval nasogastric tube with its tip in the proximal stomach and side hole in the proximal to mid stomach. No significant change in multiple dilated small bowel loops. Poor inspiration without significant change right basilar volume loss and airspace opacity compatible with atelectasis. Minimal scoliosis. IMPRESSION: 1. Nasogastric tube tip  in the proximal stomach and side hole in the proximal to mid stomach. 2. Stable small bowel ileus or partial obstruction. Electronically Signed   By: Claudie Revering M.D.   On: 05/03/2019 15:06   Dg Abd 2 Views  Result Date: 05/03/2019 CLINICAL DATA:  Bilateral lower abdominal pain and low back spasms causing shortness of breath. EXAM: ABDOMEN - 2 VIEW COMPARISON:  CT abdomen dated 05/02/2019. FINDINGS: Persistent gaseous distention of small-bowel loops within the central abdomen, stable to increased compared to yesterday's CT, with associated air-fluid levels suggesting mechanical obstruction. Suspect some new distension of the large bowel. No evidence of free intraperitoneal air. No  evidence of renal or ureteral calculi. IMPRESSION: Evidence of bowel obstruction, possibly large bowel as well as small bowel involvement. These results will be called to the ordering clinician or representative by the Radiologist Assistant, and communication documented in the PACS or zVision Dashboard. Electronically Signed   By: Franki Cabot M.D.   On: 05/03/2019 13:03   Dg Abd Portable 1v-small Bowel Obstruction Protocol-initial, 8 Hr Delay  Result Date: 05/04/2019 CLINICAL DATA:  Small bowel protocol, 8 hour delay EXAM: PORTABLE ABDOMEN - 1 VIEW COMPARISON:  05/03/2019, 05/02/2019 FINDINGS: Esophageal tube tip is in the left upper quadrant. Persistent dilatation of primarily small bowel, measuring up to 4.6 cm. Dilute contrast within dilated small bowel loops. No definitive contrast within the colon. Curvilinear metallic opacity over the central abdomen may represent external artifact IMPRESSION: Findings consistent with small bowel obstruction. Dilute contrast within dilated loops of small bowel. Electronically Signed   By: Donavan Foil M.D.   On: 05/04/2019 02:20      Kalman Drape , [facility] 05/04/2019, 9:38 AM  Pager: [phone] Mon-Wed, Friday 7:00am-4:30pm Thurs 7am-11:30am  Consults: [phone]

## 2019-05-04 NOTE — Progress Notes (Signed)
  Date: 05/04/2019  Patient name: Gwendolyn Nguyen  Medical record number: [id number]  Date of birth: 1986-07-07   I have seen and evaluated this patient and I have discussed the plan of care with the house staff. Please see Dr. Lonzo Candy note for complete details. I concur with his findings and plan.     Sid Falcon, MD 05/04/2019, 6:08 PM

## 2019-05-05 ENCOUNTER — Inpatient Hospital Stay (HOSPITAL_COMMUNITY): Payer: BC Managed Care – PPO

## 2019-05-05 DIAGNOSIS — Z5329 Procedure and treatment not carried out because of patient's decision for other reasons: Secondary | ICD-10-CM

## 2019-05-05 LAB — BASIC METABOLIC PANEL
Anion gap: 11 (ref 5–15)
BUN: 24 mg/dL — ABNORMAL HIGH (ref 6–20)
CO2: 20 mmol/L — ABNORMAL LOW (ref 22–32)
Calcium: 8.6 mg/dL — ABNORMAL LOW (ref 8.9–10.3)
Chloride: 113 mmol/L — ABNORMAL HIGH (ref 98–111)
Creatinine, Ser: 1.71 mg/dL — ABNORMAL HIGH (ref 0.44–1.00)
GFR calc Af Amer: 45 mL/min — ABNORMAL LOW (ref >60)
GFR calc non Af Amer: 39 mL/min — ABNORMAL LOW (ref >60)
Glucose, Bld: 105 mg/dL — ABNORMAL HIGH (ref 70–99)
Potassium: 4.5 mmol/L (ref 3.5–5.1)
Sodium: 144 mmol/L (ref 135–145)

## 2019-05-05 LAB — IRON AND TIBC
Iron: 41 ug/dL (ref 28–170)
Saturation Ratios: 17 % (ref 10.4–31.8)
TIBC: 238 ug/dL — ABNORMAL LOW (ref 250–450)
UIBC: 197 ug/dL

## 2019-05-05 LAB — RETICULOCYTES
Immature Retic Fract: 26 % — ABNORMAL HIGH (ref 2.3–15.9)
RBC.: 3.39 MIL/uL — ABNORMAL LOW (ref 3.87–5.11)
Retic Count, Absolute: 24.4 10*3/uL (ref 19.0–186.0)
Retic Ct Pct: 0.7 % (ref 0.4–3.1)

## 2019-05-05 LAB — CBC
HCT: 25.4 % — ABNORMAL LOW (ref 36.0–46.0)
Hemoglobin: 8.4 g/dL — ABNORMAL LOW (ref 12.0–15.0)
MCH: 24.3 pg — ABNORMAL LOW (ref 26.0–34.0)
MCHC: 33.1 g/dL (ref 30.0–36.0)
MCV: 73.4 fL — ABNORMAL LOW (ref 80.0–100.0)
Platelets: 290 10*3/uL (ref 150–400)
RBC: 3.46 MIL/uL — ABNORMAL LOW (ref 3.87–5.11)
RDW: 17.3 % — ABNORMAL HIGH (ref 11.5–15.5)
WBC: 29.6 10*3/uL — ABNORMAL HIGH (ref 4.0–10.5)
nRBC: 0.3 % — ABNORMAL HIGH (ref 0.0–0.2)

## 2019-05-05 MED ORDER — MEDROXYPROGESTERONE ACETATE 150 MG/ML IM SUSP
150.0000 mg | Freq: Once | INTRAMUSCULAR | Status: AC
  Administered 2019-05-05: 150 mg via INTRAMUSCULAR
  Filled 2019-05-05: qty 1

## 2019-05-05 NOTE — Progress Notes (Signed)
  Date: 05/05/2019  Patient name: Gwendolyn Nguyen  Medical record number: [id number]  Date of birth: [birth year]   I have seen and evaluated this patient and I have discussed the plan of care with the house staff. Please see Dr. Lonzo Candy note for complete details. I concur with his findings and plan.    Sid Falcon, MD 05/05/2019, 7:13 PM

## 2019-05-05 NOTE — Progress Notes (Addendum)
[facility]/Trauma Progress Note      Assessment/Plan Endometriosis Multiple pelvic masses ARF, secondary to dehydration  Partial SBO vs ileus - NGT with dark brown/green output, 400cc in last 24hrs, pt pulled NGT last night - 8hr delay film showed contrast in small bowel only, 24hr delay film showed contrast in small bowel - AXR pending this am - having flatus and small loose BM's - will trial clear liquids today to see if she tolerates - hopefully this resolves without the need for surgery. If surgery is indicated will coordinate with GYN.  - we will follow  FEN: CLD VTE: SCD's, heparin ID: Doxy & Mefoxin 07/14>>    WBC up to 29.6, per medicine, monitor Foley: none Follow up: TBD   LOS: 3 days    Subjective/chief complaint  Pt is having lower abdominal pain and RUQ pain that radiates into her back with deep breaths. Talked to mom on speak phone. Was able to answer some of patient's questions. Showed her the AXR and explained our concerns of SBO.   Objective: Vital signs in last 24 hours: Temp:  [97.6 F (36.4 C)-99.8 F (37.7 C)] 99.8 F (37.7 C) (07/16 0523) Pulse Rate:  [86-90] 90 (07/16 0523) Resp:  [18] 18 (07/16 0523) BP: (125-145)/(77-79) 140/77 (07/16 0523) SpO2:  [100 %] 100 % (07/16 0523) Last BM Date: 05/04/19  Intake/Output from previous day: 07/15 0701 - 07/16 0700 In: 0  Out: 920 [Urine:520; Emesis/NG output:400] Intake/Output this shift: No intake/output data recorded.  PE: Gen:  Alert, NAD, appears very uncomfortable Pulm:  Rate and effort normal Abd: Soft, ND, few BS and tinkling, TTP of lower abdomen and RUQ with guarding. No peritonitis  Skin: no rashes noted, warm and dry  Anti-infectives: Anti-infectives (From admission, onward)   Start     Dose/Rate Route Frequency Ordered Stop   05/03/19 2200  cefOXitin (MEFOXIN) 2 g in sodium chloride 0.9 % 100 mL IVPB     2 g 200 mL/hr over 30 Minutes Intravenous Every 8 hours  05/03/19 1843     05/03/19 1200  cefOXitin (MEFOXIN) 2 g in sodium chloride 0.9 % 100 mL IVPB  Status:  Discontinued     2 g 200 mL/hr over 30 Minutes Intravenous Every 6 hours 05/03/19 1059 05/03/19 1843   05/03/19 1100  doxycycline (VIBRAMYCIN) 100 mg in sodium chloride 0.9 % 250 mL IVPB     100 mg 125 mL/hr over 120 Minutes Intravenous Every 12 hours 05/03/19 1059        Lab Results:  Recent Labs    05/04/19 0811 05/05/19 0322  WBC 24.6* 29.6*  HGB 9.8* 8.4*  HCT 30.0* 25.4*  PLT 305 290   BMET Recent Labs    05/04/19 0811 05/05/19 0322  NA 143 144  K 5.1 4.5  CL 110 113*  CO2 24 20*  GLUCOSE 113* 105*  BUN 27* 24*  CREATININE 1.90* 1.71*  CALCIUM 8.8* 8.6*   PT/INR No results for input(s): LABPROT, INR in the last 72 hours. CMP     Component Value Date/Time   NA 144 05/05/2019 0322   K 4.5 05/05/2019 0322   CL 113 (H) 05/05/2019 0322   CO2 20 (L) 05/05/2019 0322   GLUCOSE 105 (H) 05/05/2019 0322   BUN 24 (H) 05/05/2019 0322   CREATININE 1.71 (H) 05/05/2019 0322   CALCIUM 8.6 (L) 05/05/2019 0322   PROT 8.2 (H) 05/02/2019 0918   ALBUMIN 2.8 (L) 05/02/2019 0918   AST  19 05/02/2019 0918   ALT 16 05/02/2019 0918   ALKPHOS 63 05/02/2019 0918   BILITOT 2.1 (H) 05/02/2019 0918   GFRNONAA 39 (L) 05/05/2019 0322   GFRAA 45 (L) 05/05/2019 0322   Lipase     Component Value Date/Time   LIPASE 57 (H) 05/02/2019 0918    Studies/Results: Dg Abd 1 View  Result Date: 05/03/2019 CLINICAL DATA:  Nasogastric tube placement. EXAM: ABDOMEN - 1 VIEW COMPARISON:  Earlier today. FINDINGS: Interval nasogastric tube with its tip in the proximal stomach and side hole in the proximal to mid stomach. No significant change in multiple dilated small bowel loops. Poor inspiration without significant change right basilar volume loss and airspace opacity compatible with atelectasis. Minimal scoliosis. IMPRESSION: 1. Nasogastric tube tip in the proximal stomach and side hole in the  proximal to mid stomach. 2. Stable small bowel ileus or partial obstruction. Electronically Signed   By: Claudie Revering M.D.   On: 05/03/2019 15:06   Dg Abd 2 Views  Result Date: 05/03/2019 CLINICAL DATA:  Bilateral lower abdominal pain and low back spasms causing shortness of breath. EXAM: ABDOMEN - 2 VIEW COMPARISON:  CT abdomen dated 05/02/2019. FINDINGS: Persistent gaseous distention of small-bowel loops within the central abdomen, stable to increased compared to yesterday's CT, with associated air-fluid levels suggesting mechanical obstruction. Suspect some new distension of the large bowel. No evidence of free intraperitoneal air. No evidence of renal or ureteral calculi. IMPRESSION: Evidence of bowel obstruction, possibly large bowel as well as small bowel involvement. These results will be called to the ordering clinician or representative by the Radiologist Assistant, and communication documented in the PACS or zVision Dashboard. Electronically Signed   By: Franki Cabot M.D.   On: 05/03/2019 13:03   Dg Abd Portable 1v  Result Date: 05/04/2019 CLINICAL DATA:  Abdominal pain with recent bowel obstruction EXAM: PORTABLE ABDOMEN - 1 VIEW COMPARISON:  May 04, 2019 study obtained earlier in the day FINDINGS: Nasogastric tube tip and side port are in the stomach. There remain loops of mildly dilated bowel without air-fluid levels. No free air. Dilute contrast remains within loops of small bowel. No abnormal calcifications. IMPRESSION: Nasogastric tube tip and side port in stomach. Bowel appear similar to earlier in the day with mildly dilated bowel, likely due to persistent degree of bowel obstruction. No free air. Electronically Signed   By: Lowella Grip III M.D.   On: 05/04/2019 17:15   Dg Abd Portable 1v-small Bowel Obstruction Protocol-initial, 8 Hr Delay  Result Date: 05/04/2019 CLINICAL DATA:  Small bowel protocol, 8 hour delay EXAM: PORTABLE ABDOMEN - 1 VIEW COMPARISON:  05/03/2019,  05/02/2019 FINDINGS: Esophageal tube tip is in the left upper quadrant. Persistent dilatation of primarily small bowel, measuring up to 4.6 cm. Dilute contrast within dilated small bowel loops. No definitive contrast within the colon. Curvilinear metallic opacity over the central abdomen may represent external artifact IMPRESSION: Findings consistent with small bowel obstruction. Dilute contrast within dilated loops of small bowel. Electronically Signed   By: Donavan Foil M.D.   On: 05/04/2019 02:20      Kalman Drape , [facility] 05/05/2019, 8:56 AM  Pager: [phone] Mon-Wed, Friday 7:00am-4:30pm Thurs 7am-11:30am  Consults: [phone]

## 2019-05-05 NOTE — Progress Notes (Signed)
2010 Pt anxious and agitated, complained irritation of NG tube and pain in throat, ears and head. Pt wanted her NG tube removed and threatened to remove it by herself. This RN tried to calm pt and paged MD.  2020 MD had seen pt, ordered NG tube to be removed.   2025 Removed NG tube, pt tolerated well. Will continue to monitor.

## 2019-05-05 NOTE — Plan of Care (Signed)
  Problem: Clinical Measurements: Goal: Ability to maintain clinical measurements within normal limits will improve Outcome: Progressing   Problem: Elimination: Goal: Will not experience complications related to bowel motility Outcome: Progressing Goal: Will not experience complications related to urinary retention Outcome: Progressing   Problem: Pain Managment: Goal: General experience of comfort will improve Outcome: Progressing   Problem: Clinical Measurements: Goal: Ability to maintain clinical measurements within normal limits will improve Outcome: Progressing   Problem: Elimination: Goal: Will not experience complications related to bowel motility Outcome: Progressing Goal: Will not experience complications related to urinary retention Outcome: Progressing   Problem: Pain Managment: Goal: General experience of comfort will improve Outcome: Progressing

## 2019-05-05 NOTE — Progress Notes (Signed)
On afternoon round Pt has decided she would like to take the Depo-Provera shot. Will reorder.   Tamsen Snider, MD PGY1  [phone]

## 2019-05-05 NOTE — Progress Notes (Signed)
   Subjective: Overnight team called because patient pulled NGT out. She says she became agitated and couldn't stand having it in any longer. Having flatus and reports 5-6 small bowel movements. Tolerating clear liquids on rounds.  Patient expresses frustration but is reassured we are all working to resolve acute kidney injury and bowel obstruction. Future plans for surgery and treatment options are being recommended by Ob/Gyn and she will be updated. Refuses to take depo-provera.   Objective:  Vital signs in last 24 hours: Vitals:   05/04/19 0507 05/04/19 1332 05/04/19 2108 05/05/19 0523  BP: (!) 141/85 125/79 (!) 145/77 140/77  Pulse: (!) 106 89 86 90  Resp: 18  18 18   Temp: 98.9 F (37.2 C) 97.6 F (36.4 C) 98.4 F (36.9 C) 99.8 F (37.7 C)  TempSrc: Oral Oral Oral Oral  SpO2: 100% 100% 100% 100%  Weight:      Height:       Physical Exam Constitutional:      Appearance: Normal appearance.  Cardiovascular:     Rate and Rhythm: Regular rhythm. Tachycardia present.     Pulses: Normal pulses.     Heart sounds: Normal heart sounds.  Pulmonary:     Effort: Pulmonary effort is normal.     Breath sounds: Normal breath sounds.  Abdominal:     General: Bowel sounds are normal. There is distension.  Neurological:     Mental Status: She is alert.     Assessment/Plan:  Active Problems:   Pelvic mass   Small bowel obstruction (HCC)  Pelvic mass Small bowel obstruction Patientabdomen distended on exam with bowel sounds present today and flatulence over night. 5-6 small bowel movements.  NGT pulled out by patient . Air throughout the colon read on xray.  - appreciate OB/Gyn recommendations - appreciate general surgeryrecommendations - PRN dilaudidfor pain - Toradol  - Zofran PRN - Refusing DMPA, npo can not start aygestin  AKI Creatinine 2.79>2.03>1.9. Baseline 09/09/2018 1.01.Improving with IV fluids. -c/w IV fluids - BMP  Microcytic Anemia Hgb 8.4, MCV 73,  TIBC low 238. Iron 41.Elevated retics. Likely due to chronic Iron deficiency anemia .  Diet:NPO VTE: Heparin Code:Full  Dispo: Anticipated discharge in approximately 3-4 day(s).   Tamsen Snider, MD PGY1  [phone]

## 2019-05-06 ENCOUNTER — Inpatient Hospital Stay (HOSPITAL_COMMUNITY): Payer: BC Managed Care – PPO

## 2019-05-06 DIAGNOSIS — Z793 Long term (current) use of hormonal contraceptives: Secondary | ICD-10-CM

## 2019-05-06 LAB — BASIC METABOLIC PANEL
Anion gap: 6 (ref 5–15)
BUN: 20 mg/dL (ref 6–20)
CO2: 21 mmol/L — ABNORMAL LOW (ref 22–32)
Calcium: 8.4 mg/dL — ABNORMAL LOW (ref 8.9–10.3)
Chloride: 114 mmol/L — ABNORMAL HIGH (ref 98–111)
Creatinine, Ser: 1.62 mg/dL — ABNORMAL HIGH (ref 0.44–1.00)
GFR calc Af Amer: 49 mL/min — ABNORMAL LOW (ref >60)
GFR calc non Af Amer: 42 mL/min — ABNORMAL LOW (ref >60)
Glucose, Bld: 164 mg/dL — ABNORMAL HIGH (ref 70–99)
Potassium: 4 mmol/L (ref 3.5–5.1)
Sodium: 141 mmol/L (ref 135–145)

## 2019-05-06 LAB — CBC
HCT: 23.2 % — ABNORMAL LOW (ref 36.0–46.0)
Hemoglobin: 7.8 g/dL — ABNORMAL LOW (ref 12.0–15.0)
MCH: 24.8 pg — ABNORMAL LOW (ref 26.0–34.0)
MCHC: 33.6 g/dL (ref 30.0–36.0)
MCV: 73.7 fL — ABNORMAL LOW (ref 80.0–100.0)
Platelets: 299 10*3/uL (ref 150–400)
RBC: 3.15 MIL/uL — ABNORMAL LOW (ref 3.87–5.11)
RDW: 17.5 % — ABNORMAL HIGH (ref 11.5–15.5)
WBC: 32.2 10*3/uL — ABNORMAL HIGH (ref 4.0–10.5)
nRBC: 0.4 % — ABNORMAL HIGH (ref 0.0–0.2)

## 2019-05-06 LAB — FERRITIN: Ferritin: 393 ng/mL — ABNORMAL HIGH (ref 11–307)

## 2019-05-06 MED ORDER — BOOST / RESOURCE BREEZE PO LIQD CUSTOM
1.0000 | Freq: Three times a day (TID) | ORAL | Status: DC
  Administered 2019-05-06 – 2019-05-14 (×14): 1 via ORAL

## 2019-05-06 MED ORDER — SODIUM CHLORIDE 0.9% FLUSH
10.0000 mL | Freq: Two times a day (BID) | INTRAVENOUS | Status: DC
  Administered 2019-05-06 – 2019-05-08 (×4): 10 mL

## 2019-05-06 MED ORDER — SODIUM CHLORIDE 0.9% FLUSH
10.0000 mL | INTRAVENOUS | Status: DC | PRN
  Administered 2019-05-11: 05:00:00 10 mL
  Filled 2019-05-06: qty 40

## 2019-05-06 MED ORDER — PRO-STAT SUGAR FREE PO LIQD
30.0000 mL | Freq: Two times a day (BID) | ORAL | Status: DC
  Administered 2019-05-06 – 2019-05-07 (×3): 30 mL via ORAL
  Filled 2019-05-06 (×11): qty 30

## 2019-05-06 MED ORDER — SODIUM CHLORIDE 0.9 % IV SOLN
510.0000 mg | Freq: Once | INTRAVENOUS | Status: AC
  Administered 2019-05-06: 510 mg via INTRAVENOUS
  Filled 2019-05-06: qty 17

## 2019-05-06 NOTE — Progress Notes (Addendum)
   Subjective: Pt seen on AM rounds today. She says she feels okay today. Depo shot made ovaries painful. Bowel movements are small and watery. Passing  Gas. Vomited yesterday, green. No nausea right now. Says she feels like she ate a lot yesterday. Had beef broth, drank 2 ginger ales and 2 cups of water and some apple juice. and had some jello an dice cream.  Told patient we would give her iron today and she is amendable.   Objective:  Vital signs in last 24 hours: Vitals:   05/05/19 0523 05/05/19 1439 05/05/19 2157 05/06/19 0429  BP: 140/77 132/86 122/84 130/83  Pulse: 90 91 85 87  Resp: 18 17 18 18   Temp: 99.8 F (37.7 C) 98 F (36.7 C) 98.7 F (37.1 C) 98.3 F (36.8 C)  TempSrc: Oral Oral Oral Oral  SpO2: 100% 98% 100% 98%  Weight:      Height:       Physical Exam Vitals signs reviewed.  Constitutional:      Appearance: She is ill-appearing.  Cardiovascular:     Rate and Rhythm: Normal rate and regular rhythm.     Pulses: Normal pulses.     Heart sounds: Normal heart sounds.  Pulmonary:     Effort: Pulmonary effort is normal.     Breath sounds: Normal breath sounds.  Abdominal:     General: Bowel sounds are normal. There is distension.     Palpations: There is no mass.     Comments: Tender in rlq   Skin:    General: Skin is warm and dry.    Assessment/Plan:  Active Problems:   Pelvic mass   Small bowel obstruction (HCC)  Gwendolyn Nguyen is a 33 year old female here with worsening of abdominal pain and constipation.    CT abdomen and pelvis with left adnexal mass with no interval changes, right adnexal mass with interval development measures 6.8 x 4.9 cm.  Patient has been experiencing nausea and vomiting for 2 weeks and found to have possible small bowel obstruction on imaging   #SBO  Patient pulled out NG tube on July 16.  Patient reports 9-10 small and mostly liquid bowel movements since yesterday.  Patient is still distended on physical exam but bowel  sounds are heard.  Morning x-ray shows small bowel dilation in the proximal jejunal region.  Radiologist leaning toward enteritis or ileus over bowel obstruction.  -Appreciate GI surgery recommendations -Patient has 2 go slow with liquids today   #Pelvic Mass Leukocytosis Patient with history of endometriosis treated with low OCP.Ob/Gyn has consulted and following.  Patient currently on Doxy and cefoxitin.  White count continues to increase.  Leukocytosis to 32 today trended up from 20 on admission. -DMPA on 7/16 - appreciate OB/Gyn recommendations -appreciategeneral surgeryrecommendations - PRN dilaudidfor pain -Toradol - Zofran PRN -We will consider broadening antibiotics. -Transvaginal ultrasound today to better evaluate pelvic mass  #AKI Creatinine 2.79>2.03>[id number]. Baseline 09/09/2018 1.01.Improving with IV fluids. -c/w IV fluids -Will not check BMP tomorrow insetting of anemia  #Microcytic Anemia Hgb 8.4, MCV 73, TIBC low 238. Iron 41. Ferritin 393. Elevated retics. Likely due to chronic Iron deficiency anemia .  Hemoglobin in November 2019 8.1.  Patient is asymptomatic -IV Iron  Diet:NPO VTE: Heparin Code:Full  #Dispo: Anticipated discharge in approximately3-4day(s).  Tamsen Snider, MD PGY1  [phone]

## 2019-05-06 NOTE — Progress Notes (Signed)
[facility]/Trauma Progress Note      Assessment/Plan Endometriosis Multiple pelvic masses ARF, secondary to dehydration  Partial SBO vs ileus - had flatus and small loose BM's yesterday, vomiting overnight, no flatus today yet - overall improved, AXR today showed mild dilatation in proximal jejunal region, enteritis or ileus? Bowel obstruction less likely. If WBC does not improve will try antibiotics for enteritis.  - based on conversation today with medicine, they will order transvaginal US to evaluate for GYN abscess for possible explanation of leukocytosis .  - hopefully this resolves without the need for surgery. If surgery is indicated will coordinate with GYN.  - continue CLD at this time - we will follow  FEN:CLD VTE: SCD's,heparin ZH:YQMV & Mefoxin 07/14>>    WBC up to 32.2, per medicine, monitor Foley:none Follow up:TBD   LOS: 4 days    Subjective: CC: abdominal pain  Pt had 2 episodes of bilious emesis yesterday. Not taking much PO. Also had copious BM's and was passing flatus yesterday, non yet today. Her back pain that she felt like was causing her SOB has resolved and she is breathing better.   Objective: Vital signs in last 24 hours: Temp:  [98 F (36.7 C)-98.7 F (37.1 C)] 98.3 F (36.8 C) (07/17 0429) Pulse Rate:  [85-91] 87 (07/17 0429) Resp:  [17-18] 18 (07/17 0429) BP: (122-132)/(83-86) 130/83 (07/17 0429) SpO2:  [98 %-100 %] 98 % (07/17 0429) Last BM Date: 05/05/19  Intake/Output from previous day: 07/16 0701 - 07/17 0700 In: 1080 [P.O.:1080] Out: -  Intake/Output this shift: No intake/output data recorded.  PE: Gen: Alert, NAD, pleasant, cooperative, looks better overall today Pulm:Rate andeffort normal Abd: Soft, ND,+ BSand tinkling,TTP of lower abdomen with guarding. No peritonitis Skin: no rashes noted, warm and dry   Anti-infectives: Anti-infectives (From admission, onward)   Start     Dose/Rate Route  Frequency Ordered Stop   05/03/19 2200  cefOXitin (MEFOXIN) 2 g in sodium chloride 0.9 % 100 mL IVPB     2 g 200 mL/hr over 30 Minutes Intravenous Every 8 hours 05/03/19 1843     05/03/19 1200  cefOXitin (MEFOXIN) 2 g in sodium chloride 0.9 % 100 mL IVPB  Status:  Discontinued     2 g 200 mL/hr over 30 Minutes Intravenous Every 6 hours 05/03/19 1059 05/03/19 1843   05/03/19 1100  doxycycline (VIBRAMYCIN) 100 mg in sodium chloride 0.9 % 250 mL IVPB     100 mg 125 mL/hr over 120 Minutes Intravenous Every 12 hours 05/03/19 1059        Lab Results:  Recent Labs    05/05/19 0322 05/06/19 0115  WBC 29.6* 32.2*  HGB 8.4* 7.8*  HCT 25.4* 23.2*  PLT 290 299   BMET Recent Labs    05/05/19 0322 05/06/19 0115  NA 144 141  K 4.5 4.0  CL 113* 114*  CO2 20* 21*  GLUCOSE 105* 164*  BUN 24* 20  CREATININE 1.71* 1.62*  CALCIUM 8.6* 8.4*   PT/INR No results for input(s): LABPROT, INR in the last 72 hours. CMP     Component Value Date/Time   NA 141 05/06/2019 0115   K 4.0 05/06/2019 0115   CL 114 (H) 05/06/2019 0115   CO2 21 (L) 05/06/2019 0115   GLUCOSE 164 (H) 05/06/2019 0115   BUN 20 05/06/2019 0115   CREATININE 1.62 (H) 05/06/2019 0115   CALCIUM 8.4 (L) 05/06/2019 0115   PROT 8.2 (H) 05/02/2019 7846  ALBUMIN 2.8 (L) 05/02/2019 0918   AST 19 05/02/2019 0918   ALT 16 05/02/2019 0918   ALKPHOS 63 05/02/2019 0918   BILITOT 2.1 (H) 05/02/2019 0918   GFRNONAA 42 (L) 05/06/2019 0115   GFRAA 49 (L) 05/06/2019 0115   Lipase     Component Value Date/Time   LIPASE 57 (H) 05/02/2019 0918    Studies/Results: Dg Abd 1 View  Result Date: 05/06/2019 CLINICAL DATA:  Bowel obstruction EXAM: ABDOMEN - 1 VIEW COMPARISON:  May 05, 2019 FINDINGS: There are loops of borderline dilated small bowel in the left upper abdomen. No other bowel dilatation evident. No air-fluid levels appreciable. No free air. No abnormal calcifications. IMPRESSION: Mild dilatation of small bowel in the  proximal jejunal region. No other bowel dilatation. Question a degree of enteritis or ileus. Bowel obstruction felt to be somewhat less likely. No free air. Electronically Signed   By: Lowella Grip III M.D.   On: 05/06/2019 08:54   Dg Abd 1 View  Result Date: 05/05/2019 CLINICAL DATA:  Right-sided abdominal pain, small bowel obstruction. EXAM: ABDOMEN - 1 VIEW COMPARISON:  05/04/2019 FINDINGS: Interval removal of enteric tube. There are a few air-filled minimally dilated small bowel loops without significant change. Air is present throughout the colon. No free peritoneal air. Remainder of the exam is unchanged. IMPRESSION: A few persistent air-filled minimally dilated small bowel loops with air throughout the colon. Findings may be due to ongoing ileus versus partial small bowel obstructive process. Electronically Signed   By: Marin Olp M.D.   On: 05/05/2019 09:32   Dg Abd Portable 1v  Result Date: 05/04/2019 CLINICAL DATA:  Abdominal pain with recent bowel obstruction EXAM: PORTABLE ABDOMEN - 1 VIEW COMPARISON:  May 04, 2019 study obtained earlier in the day FINDINGS: Nasogastric tube tip and side port are in the stomach. There remain loops of mildly dilated bowel without air-fluid levels. No free air. Dilute contrast remains within loops of small bowel. No abnormal calcifications. IMPRESSION: Nasogastric tube tip and side port in stomach. Bowel appear similar to earlier in the day with mildly dilated bowel, likely due to persistent degree of bowel obstruction. No free air. Electronically Signed   By: Lowella Grip III M.D.   On: 05/04/2019 17:15      Kalman Drape , [facility] 05/06/2019, 9:17 AM  Pager: [phone] Mon-Wed, Friday 7:00am-4:30pm Thurs 7am-11:30am  Consults: [phone]

## 2019-05-06 NOTE — Progress Notes (Signed)
IV team consult placed at 1311 due to patient's midline IV continuing to beep after being re started several times. Awaiting arrival to patient's room.

## 2019-05-07 ENCOUNTER — Inpatient Hospital Stay (HOSPITAL_COMMUNITY): Payer: BC Managed Care – PPO

## 2019-05-07 LAB — BASIC METABOLIC PANEL
Anion gap: 7 (ref 5–15)
BUN: 13 mg/dL (ref 6–20)
CO2: 19 mmol/L — ABNORMAL LOW (ref 22–32)
Calcium: 8.2 mg/dL — ABNORMAL LOW (ref 8.9–10.3)
Chloride: 113 mmol/L — ABNORMAL HIGH (ref 98–111)
Creatinine, Ser: 1.29 mg/dL — ABNORMAL HIGH (ref 0.44–1.00)
GFR calc Af Amer: >60 mL/min (ref >60)
GFR calc non Af Amer: 55 mL/min — ABNORMAL LOW (ref >60)
Glucose, Bld: 107 mg/dL — ABNORMAL HIGH (ref 70–99)
Potassium: 3.4 mmol/L — ABNORMAL LOW (ref 3.5–5.1)
Sodium: 139 mmol/L (ref 135–145)

## 2019-05-07 LAB — CBC WITH DIFFERENTIAL/PLATELET
Abs Immature Granulocytes: 1.19 10*3/uL — ABNORMAL HIGH (ref 0.00–0.07)
Basophils Absolute: 0.1 10*3/uL (ref 0.0–0.1)
Basophils Relative: 0 %
Eosinophils Absolute: 0.2 10*3/uL (ref 0.0–0.5)
Eosinophils Relative: 1 %
HCT: 24 % — ABNORMAL LOW (ref 36.0–46.0)
Hemoglobin: 7.7 g/dL — ABNORMAL LOW (ref 12.0–15.0)
Immature Granulocytes: 5 %
Lymphocytes Relative: 9 %
Lymphs Abs: 2.2 10*3/uL (ref 0.7–4.0)
MCH: 24.2 pg — ABNORMAL LOW (ref 26.0–34.0)
MCHC: 32.1 g/dL (ref 30.0–36.0)
MCV: 75.5 fL — ABNORMAL LOW (ref 80.0–100.0)
Monocytes Absolute: 1.2 10*3/uL — ABNORMAL HIGH (ref 0.1–1.0)
Monocytes Relative: 5 %
Neutro Abs: 18.9 10*3/uL — ABNORMAL HIGH (ref 1.7–7.7)
Neutrophils Relative %: 80 %
Platelets: 322 10*3/uL (ref 150–400)
RBC: 3.18 MIL/uL — ABNORMAL LOW (ref 3.87–5.11)
RDW: 18.1 % — ABNORMAL HIGH (ref 11.5–15.5)
WBC: 23.6 10*3/uL — ABNORMAL HIGH (ref 4.0–10.5)
nRBC: 0.5 % — ABNORMAL HIGH (ref 0.0–0.2)

## 2019-05-07 NOTE — Progress Notes (Addendum)
   Subjective:  Patient was examined this morning while sitting at the edge of the bed.Patient feels a little anxious because she has never been sick this long. Otherwise, she feels about the same as yesterday and attempting to move around her room more.   She had minor bowel movements over night with some gas. Denies any nausea or vomiting. We discussed goals for the day is to help her bowels move by walking more.   Objective:  Vital signs in last 24 hours: Vitals:   05/06/19 0429 05/06/19 1513 05/06/19 2037 05/07/19 0554  BP: 130/83 126/79 (!) 145/88 133/77  Pulse: 87 99 84 84  Resp: 18 17 18 18   Temp: 98.3 F (36.8 C) 98.5 F (36.9 C) 98.7 F (37.1 C) 98.8 F (37.1 C)  TempSrc: Oral Oral Oral Oral  SpO2: 98% 98% 100% 98%  Weight:      Height:       Physical Exam Constitutional:      Appearance: Normal appearance.  Cardiovascular:     Rate and Rhythm: Normal rate and regular rhythm.     Pulses: Normal pulses.     Heart sounds: Normal heart sounds.  Pulmonary:     Effort: Pulmonary effort is normal.     Breath sounds: Normal breath sounds.  Abdominal:     General: Bowel sounds are normal. There is distension.  Neurological:     Mental Status: She is alert.     Assessment/Plan:  Active Problems:   Pelvic mass   Small bowel obstruction (HCC)  Keila Turan is a 33 year old female here with worsening of abdominal pain and constipation.    CT abdomen and pelvis with left adnexal mass with no interval changes, right adnexal mass with interval development measures 6.8 x 4.9 cm.  Patient has been experiencing nausea and vomiting for 2 weeks and found to have possible small bowel obstruction on imaging   #SBO  Patient pulled out NG tube on July 16.  Patient reports loose stool and flatulence this morning.  Patient is still distended on physical exam but bowel sounds are heard. Ileus versus small bowel obstruction.  As Depo-Provera shot helps patient with pain,  patient encouraged to take less pain medication.  Encouraged to walk today.  -Appreciate GI surgery recommendations -Morning x-ray -Walk with nurse   #Pelvic Mass Leukocytosis Patient with history of endometriosis treated with low OCP.Ob/Gyn has consulted and following.  Patient currently on Doxy and cefoxitin.  White count decreased from 32-23 today.   -DMPA on 7/16 - appreciate OB/Gyn recommendations -appreciategeneral surgeryrecommendations - PRN dilaudidfor pain -Toradol - Zofran PRN -Transvaginal ultrasound today to better evaluate pelvic mass   #AKI Creatinine 2.79>2.03>1.9.1.72.1.61. Baseline 09/09/2018 1.01.Improving with IV fluids. -c/w IV fluids -Will not check BMP tomorrow insetting of anemia  #Microcytic Anemia Hgb 8.4, MCV 73, TIBC low 238. Iron 41. Ferritin 393. Elevated retics. Likely due to chronic Iron deficiency anemia .  Hemoglobin in November 2019 8.1.  Patient is asymptomatic.  IV iron on 7/17  Diet: Liquid VTE: Heparin Code:Full  #Dispo: Anticipated discharge in approximately3-4day(s).  Tamsen Snider, MD PGY1  [phone]

## 2019-05-07 NOTE — Progress Notes (Signed)
Patient ID: Gwendolyn Nguyen, female   DOB: 1986-04-07, 33 y.o.   MRN: [id number] [facility] Surgery Progress Note:   * No surgery found *  Subjective: Mental status is alert; reviewed history with her Objective: Vital signs in last 24 hours: Temp:  [98.5 F (36.9 C)-98.8 F (37.1 C)] 98.8 F (37.1 C) (07/18 0554) Pulse Rate:  [84-99] 84 (07/18 0554) Resp:  [17-18] 18 (07/18 0554) BP: (126-145)/(77-88) 133/77 (07/18 0554) SpO2:  [98 %-100 %] 98 % (07/18 0554)  Intake/Output from previous day: 07/17 0701 - 07/18 0700 In: 710 [P.O.:700; I.V.:10] Out: -  Intake/Output this shift: No intake/output data recorded.  Physical Exam: Work of breathing is normal; pain is about the same.  Passing flatus  Lab Results:  Results for orders placed or performed during the hospital encounter of 05/02/19 (from the past 48 hour(s))  Ferritin     Status: Abnormal   Collection Time: 05/06/19  1:15 AM  Result Value Ref Range   Ferritin 393 (H) 11 - 307 ng/mL    Comment: Performed at [facility], [place] [address], [place], [place] [address]  CBC     Status: Abnormal   Collection Time: 05/06/19  1:15 AM  Result Value Ref Range   WBC 32.2 (H) 4.0 - 10.5 K/uL   RBC 3.15 (L) 3.87 - 5.11 MIL/uL   Hemoglobin 7.8 (L) 12.0 - 15.0 g/dL    Comment: Reticulocyte Hemoglobin testing may be clinically indicated, consider ordering this additional test [id number]    HCT 23.2 (L) 36.0 - 46.0 %   MCV 73.7 (L) 80.0 - 100.0 fL   MCH 24.8 (L) 26.0 - 34.0 pg   MCHC 33.6 30.0 - 36.0 g/dL   RDW 17.5 (H) 11.5 - 15.5 %   Platelets 299 150 - 400 K/uL   nRBC 0.4 (H) 0.0 - 0.2 %    Comment: Performed at [facility], [place] [address] [place], [place] [address]  Basic metabolic panel     Status: Abnormal   Collection Time: 05/06/19  1:15 AM  Result Value Ref Range   Sodium 141 135 - 145 mmol/L   Potassium 4.0 3.5 - 5.1 mmol/L   Chloride 114 (H) 98 - 111 mmol/L   CO2 21 (L) 22 - 32 mmol/L   Glucose, Bld  164 (H) 70 - 99 mg/dL   BUN 20 6 - 20 mg/dL   Creatinine, Ser 1.62 (H) 0.44 - 1.00 mg/dL   Calcium 8.4 (L) 8.9 - 10.3 mg/dL   GFR calc non Af Amer 42 (L) >60 mL/min   GFR calc Af Amer 49 (L) >60 mL/min   Anion gap 6 5 - 15    Comment: Performed at [place] [address], [place], [place] [address]  CBC with Differential/Platelet     Status: Abnormal   Collection Time: 05/07/19  4:45 AM  Result Value Ref Range   WBC 23.6 (H) 4.0 - 10.5 K/uL   RBC 3.18 (L) 3.87 - 5.11 MIL/uL   Hemoglobin 7.7 (L) 12.0 - 15.0 g/dL    Comment: Reticulocyte Hemoglobin testing may be clinically indicated, consider ordering this additional test [id number]    HCT 24.0 (L) 36.0 - 46.0 %   MCV 75.5 (L) 80.0 - 100.0 fL   MCH 24.2 (L) 26.0 - 34.0 pg   MCHC 32.1 30.0 - 36.0 g/dL   RDW 18.1 (H) 11.5 - 15.5 %   Platelets 322 150 - 400 K/uL  nRBC 0.5 (H) 0.0 - 0.2 %   Neutrophils Relative % 80 %   Neutro Abs 18.9 (H) 1.7 - 7.7 K/uL   Lymphocytes Relative 9 %   Lymphs Abs 2.2 0.7 - 4.0 K/uL   Monocytes Relative 5 %   Monocytes Absolute 1.2 (H) 0.1 - 1.0 K/uL   Eosinophils Relative 1 %   Eosinophils Absolute 0.2 0.0 - 0.5 K/uL   Basophils Relative 0 %   Basophils Absolute 0.1 0.0 - 0.1 K/uL   Immature Granulocytes 5 %   Abs Immature Granulocytes 1.19 (H) 0.00 - 0.07 K/uL    Comment: Performed at [facility] [address] [place], [place] [address]  Basic metabolic panel     Status: Abnormal   Collection Time: 05/07/19  4:45 AM  Result Value Ref Range   Sodium 139 135 - 145 mmol/L   Potassium 3.4 (L) 3.5 - 5.1 mmol/L   Chloride 113 (H) 98 - 111 mmol/L   CO2 19 (L) 22 - 32 mmol/L   Glucose, Bld 107 (H) 70 - 99 mg/dL   BUN 13 6 - 20 mg/dL   Creatinine, Ser 1.29 (H) 0.44 - 1.00 mg/dL   Calcium 8.2 (L) 8.9 - 10.3 mg/dL   GFR calc non Af Amer 55 (L) >60 mL/min   GFR calc Af Amer >60 >60 mL/min   Anion gap 7 5 - 15    Comment: Performed at [facility] [address]  [place], [place] [address]    Radiology/Results: Dg Abd 1 View  Result Date: 05/07/2019 CLINICAL DATA:  Bowel obstruction. EXAM: ABDOMEN - 1 VIEW COMPARISON:  Plain films of the abdomen dated 05/06/2019 and 05/05/2019. CT abdomen dated 05/02/2019. FINDINGS: Persistent mildly distended gas-filled loops of small bowel throughout the central abdomen, similar to the appearance on earlier CT. Air is present within the transverse colon. No evidence of soft tissue mass or abnormal fluid collection. No evidence of free intraperitoneal air. Osseous structures are unremarkable. IMPRESSION: Persistent mildly distended gas-filled loops of small bowel throughout the central abdomen, similar to the appearance on earlier CT, compatible with the partial small bowel obstruction versus ileus described on earlier CT. Electronically Signed   By: Franki Cabot M.D.   On: 05/07/2019 08:12   Dg Abd 1 View  Result Date: 05/06/2019 CLINICAL DATA:  Bowel obstruction EXAM: ABDOMEN - 1 VIEW COMPARISON:  May 05, 2019 FINDINGS: There are loops of borderline dilated small bowel in the left upper abdomen. No other bowel dilatation evident. No air-fluid levels appreciable. No free air. No abnormal calcifications. IMPRESSION: Mild dilatation of small bowel in the proximal jejunal region. No other bowel dilatation. Question a degree of enteritis or ileus. Bowel obstruction felt to be somewhat less likely. No free air. Electronically Signed   By: Lowella Grip III M.D.   On: 05/06/2019 08:54   US Pelvis Transvaginal Non-ob (tv Only)  Result Date: 05/06/2019 CLINICAL DATA:  33 year old female with a history of complex bilateral adnexal masses, suspected to represent endometriomas on prior MRI pelvis, presenting for follow-up. History of endometriosis. Uncertain LMP. EXAM: ULTRASOUND PELVIS TRANSVAGINAL TECHNIQUE: Transvaginal ultrasound examination of the pelvis was performed including evaluation of the uterus, ovaries, adnexal regions,  and pelvic cul-de-sac. COMPARISON:  05/02/2019 CT abdomen/pelvis.  12/02/2018 MRI pelvis. FINDINGS: Uterus Measurements: 10.9 x 3.3 x 5.1 cm = volume: 98 mL. Retroverted and anteflexed uterus is normal in size and configuration, with no uterine fibroids or other myometrial abnormalities. Endometrium  Thickness: 10 mm. No endometrial cavity fluid or focal endometrial mass. Right ovary Measurements: 10.6 x 4.6 x 4.3 cm = volume: 110 mL. There are multiple complex right ovarian cysts demonstrating variable low level internal echoes, largest 3.2 x 3.1 x 1.6 cm, compared to 3.1 x 2.9 x 1.5 cm on 12/02/2018 MRI using similar measurement technique, similar. No appreciable internal vascularity within the right ovarian cysts on Doppler. Left ovary Measurements: 8.4 x 5.6 x 4.9 cm = volume: 123 mL. There are multiple complex left ovarian cysts demonstrating variable low level internal echoes, largest 4.5 x 3.4 x 4.0 cm, compared to 4.1 x 2.7 x 3.4 cm on 12/02/2018 MRI using similar measurement technique, similar to mildly increased. No appreciable internal vascularity within the left ovarian cysts on Doppler. Other findings:  No abnormal free fluid. IMPRESSION: 1. Multiple complex bilateral ovarian cysts with variable low level internal echoes and with no appreciable internal vascularity on color Doppler, similar to mildly increased in size since 12/02/2018 pelvic MRI study, where they were characterized as endometriomas. 2. Please note that the new 6.8 x 4.9 cm low-attenuation anterior pelvic mass visualized superior to the right bladder on the recent 05/02/2027 unenhanced CT study cannot be evaluated on this transvaginal-only pelvic ultrasound study (transabdominal pelvic ultrasound study was not ordered). Recommend further evaluation of this pelvic mass with MRI pelvis without and with IV contrast. Alternatively, CT abdomen/pelvis performed with oral and IV contrast could be obtained initially. 3. Normal retroverted uterus.  Electronically Signed   By: Ilona Sorrel M.D.   On: 05/06/2019 12:57    Anti-infectives: Anti-infectives (From admission, onward)   Start     Dose/Rate Route Frequency Ordered Stop   05/03/19 2200  cefOXitin (MEFOXIN) 2 g in sodium chloride 0.9 % 100 mL IVPB     2 g 200 mL/hr over 30 Minutes Intravenous Every 8 hours 05/03/19 1843     05/03/19 1200  cefOXitin (MEFOXIN) 2 g in sodium chloride 0.9 % 100 mL IVPB  Status:  Discontinued     2 g 200 mL/hr over 30 Minutes Intravenous Every 6 hours 05/03/19 1059 05/03/19 1843   05/03/19 1100  doxycycline (VIBRAMYCIN) 100 mg in sodium chloride 0.9 % 250 mL IVPB     100 mg 125 mL/hr over 120 Minutes Intravenous Every 12 hours 05/03/19 1059        Assessment/Plan: Problem List: Patient Active Problem List   Diagnosis Date Noted  . Small bowel obstruction ([place])   . Pelvic mass 05/02/2019  . Elevated glucose 11/29/2018  . Class 1 obesity with serious comorbidity and body mass index (BMI) of 30.0 to 30.9 in adult 11/29/2018    Ultrasound shows bilateral complex cystic ovaries- Surgery is following but this seems more like endometriosis-defer to gyn * No surgery found *    LOS: 5 days   Matt B. Hassell Done, MD, [facility] [phone] beeper [phone]  05/07/2019 9:17 AM

## 2019-05-07 NOTE — Progress Notes (Addendum)
  Date: 05/07/2019  Patient name: Gwendolyn Nguyen  Medical record number: [id number]  Date of birth: 1986-01-06   I have seen and evaluated this patient and I have discussed the plan of care with the house staff. Please see Dr. Lonzo Candy note for complete details. I concur with his findings and plan with the following additions/corrections: Patient had TV US on 7/17 showing complex cystic structures at ovaries.  May need further imaging.  We will try to get in contact with OB/Gyn to discuss.    Sid Falcon, MD 05/07/2019, 8:07 PM

## 2019-05-08 ENCOUNTER — Inpatient Hospital Stay (HOSPITAL_COMMUNITY): Payer: BC Managed Care – PPO

## 2019-05-08 DIAGNOSIS — D509 Iron deficiency anemia, unspecified: Secondary | ICD-10-CM

## 2019-05-08 DIAGNOSIS — R600 Localized edema: Secondary | ICD-10-CM

## 2019-05-08 DIAGNOSIS — D72829 Elevated white blood cell count, unspecified: Secondary | ICD-10-CM

## 2019-05-08 DIAGNOSIS — Z8742 Personal history of other diseases of the female genital tract: Secondary | ICD-10-CM

## 2019-05-08 MED ORDER — KETOROLAC TROMETHAMINE 30 MG/ML IJ SOLN
15.0000 mg | Freq: Three times a day (TID) | INTRAMUSCULAR | Status: AC | PRN
  Administered 2019-05-09: 19:00:00 15 mg via INTRAVENOUS
  Filled 2019-05-08 (×2): qty 1

## 2019-05-08 MED ORDER — GADOBUTROL 1 MMOL/ML IV SOLN
9.0000 mL | Freq: Once | INTRAVENOUS | Status: AC | PRN
  Administered 2019-05-08: 12:00:00 9 mL via INTRAVENOUS

## 2019-05-08 NOTE — Progress Notes (Signed)
Subjective/Chief Complaint: Patient reports having more BM's and less abdominal pain   Objective: Vital signs in last 24 hours: Temp:  [98.5 F (36.9 C)-99.7 F (37.6 C)] 99.7 F (37.6 C) (07/19 0507) Pulse Rate:  [81-86] 85 (07/19 0507) Resp:  [17-18] 18 (07/19 0507) BP: (132-143)/(83-89) 134/89 (07/19 0507) SpO2:  [98 %-100 %] 100 % (07/19 0507) Last BM Date: 05/07/19(small)  Intake/Output from previous day: 07/18 0701 - 07/19 0700 In: 10023.9 [P.O.:900; I.V.:5352; IV Piggyback:3771.9] Out: -  Intake/Output this shift: Total I/O In: -  Out: 600 [Urine:600]  Exam: Abdomen soft, obese, only mildly tender  Lab Results:  Recent Labs    05/06/19 0115 05/07/19 0445  WBC 32.2* 23.6*  HGB 7.8* 7.7*  HCT 23.2* 24.0*  PLT 299 322   BMET Recent Labs    05/06/19 0115 05/07/19 0445  NA 141 139  K 4.0 3.4*  CL 114* 113*  CO2 21* 19*  GLUCOSE 164* 107*  BUN 20 13  CREATININE 1.62* 1.29*  CALCIUM 8.4* 8.2*   PT/INR No results for input(s): LABPROT, INR in the last 72 hours. ABG No results for input(s): PHART, HCO3 in the last 72 hours.  Invalid input(s): PCO2, PO2  Studies/Results: Dg Abd 1 View  Result Date: 05/07/2019 CLINICAL DATA:  Bowel obstruction. EXAM: ABDOMEN - 1 VIEW COMPARISON:  Plain films of the abdomen dated 05/06/2019 and 05/05/2019. CT abdomen dated 05/02/2019. FINDINGS: Persistent mildly distended gas-filled loops of small bowel throughout the central abdomen, similar to the appearance on earlier CT. Air is present within the transverse colon. No evidence of soft tissue mass or abnormal fluid collection. No evidence of free intraperitoneal air. Osseous structures are unremarkable. IMPRESSION: Persistent mildly distended gas-filled loops of small bowel throughout the central abdomen, similar to the appearance on earlier CT, compatible with the partial small bowel obstruction versus ileus described on earlier CT. Electronically Signed   By: Franki Cabot M.D.   On: 05/07/2019 08:12   US Pelvis Transvaginal Non-ob (tv Only)  Result Date: 05/06/2019 CLINICAL DATA:  33 year old female with a history of complex bilateral adnexal masses, suspected to represent endometriomas on prior MRI pelvis, presenting for follow-up. History of endometriosis. Uncertain LMP. EXAM: ULTRASOUND PELVIS TRANSVAGINAL TECHNIQUE: Transvaginal ultrasound examination of the pelvis was performed including evaluation of the uterus, ovaries, adnexal regions, and pelvic cul-de-sac. COMPARISON:  05/02/2019 CT abdomen/pelvis.  12/02/2018 MRI pelvis. FINDINGS: Uterus Measurements: 10.9 x 3.3 x 5.1 cm = volume: 98 mL. Retroverted and anteflexed uterus is normal in size and configuration, with no uterine fibroids or other myometrial abnormalities. Endometrium Thickness: 10 mm. No endometrial cavity fluid or focal endometrial mass. Right ovary Measurements: 10.6 x 4.6 x 4.3 cm = volume: 110 mL. There are multiple complex right ovarian cysts demonstrating variable low level internal echoes, largest 3.2 x 3.1 x 1.6 cm, compared to 3.1 x 2.9 x 1.5 cm on 12/02/2018 MRI using similar measurement technique, similar. No appreciable internal vascularity within the right ovarian cysts on Doppler. Left ovary Measurements: 8.4 x 5.6 x 4.9 cm = volume: 123 mL. There are multiple complex left ovarian cysts demonstrating variable low level internal echoes, largest 4.5 x 3.4 x 4.0 cm, compared to 4.1 x 2.7 x 3.4 cm on 12/02/2018 MRI using similar measurement technique, similar to mildly increased. No appreciable internal vascularity within the left ovarian cysts on Doppler. Other findings:  No abnormal free fluid. IMPRESSION: 1. Multiple complex bilateral ovarian cysts with variable low level internal echoes and  with no appreciable internal vascularity on color Doppler, similar to mildly increased in size since 12/02/2018 pelvic MRI study, where they were characterized as endometriomas. 2. Please note that  the new 6.8 x 4.9 cm low-attenuation anterior pelvic mass visualized superior to the right bladder on the recent 05/02/2027 unenhanced CT study cannot be evaluated on this transvaginal-only pelvic ultrasound study (transabdominal pelvic ultrasound study was not ordered). Recommend further evaluation of this pelvic mass with MRI pelvis without and with IV contrast. Alternatively, CT abdomen/pelvis performed with oral and IV contrast could be obtained initially. 3. Normal retroverted uterus. Electronically Signed   By: Ilona Sorrel M.D.   On: 05/06/2019 12:57    Anti-infectives: Anti-infectives (From admission, onward)   Start     Dose/Rate Route Frequency Ordered Stop   05/03/19 2200  cefOXitin (MEFOXIN) 2 g in sodium chloride 0.9 % 100 mL IVPB     2 g 200 mL/hr over 30 Minutes Intravenous Every 8 hours 05/03/19 1843     05/03/19 1200  cefOXitin (MEFOXIN) 2 g in sodium chloride 0.9 % 100 mL IVPB  Status:  Discontinued     2 g 200 mL/hr over 30 Minutes Intravenous Every 6 hours 05/03/19 1059 05/03/19 1843   05/03/19 1100  doxycycline (VIBRAMYCIN) 100 mg in sodium chloride 0.9 % 250 mL IVPB     100 mg 125 mL/hr over 120 Minutes Intravenous Every 12 hours 05/03/19 1059        Assessment/Plan: Partial SBO, complex pelvic process  GYN note seen.  Suspect complex PID.  MRI has been ordered. Clinically, pSBO is resolving.  Ok to advance diet post MRI from our standpoint   LOS: 6 days    Coralie Keens 05/08/2019

## 2019-05-08 NOTE — Progress Notes (Signed)
Late Entry- ObGyn rounds-  Pt seen at 5-5.50 pm 05/07/19 33 yo G0, single female, known to [employer] for several years with known bilateral complex tubo-ovarian complex cysts likely from prior H/o PID. She has worsening dysmenorrhea, pelvic pain, dyschezia and was advised surgery by 2 different doctors in the practice but pt preferred to defer surgery as she would get better and was worried about impact on her fertility. She was seen by GI (Dr Collene Mares) for rectal bleeding and dyschezia and Colonoscopy was negative.  She was seen in Jan'20 in office for pain, pelvic sono noted bilateral tubular cystic masses involving ovaries as well. Gono/Chlamydia neg. MRI Feb'20 also noted bilateral tubular cystic areas, likely endometriosis, she was put on continuous contraceptive to prevent menses and was scheduled to see Dr Kerin Perna- Reproductive infertility specialist for percutaneous drainage vs fertility preserving surgery in Aug'20.   In the interim, she is admitted for acute abdominal pain, distention, vomiting with clinical impression of small bowel obstruction vs ileus.  She denies recent intercourse (in months), no new partner, denies fever. She reports feeling well 4th July when visiting family but started to have decreased appetite and bloating from 04/26/19, which she has had in the past but got worse on 7/10 with constant vomiting and inability to eat or drink much. She presented to ER on 7/13 and was diagnosed with SBO vs Ileus, high WBC, high creatinine with acute renal insufficiency. On CT, bilateral complex pelvic masses were noted as prior MRI but a third mass was noted in right suprapubic area about 6-7 cm. Her pelvic sono done on 7/17 was not very helpful as the 3rd mass was not seen transvaginally and radiology didn't perform transabdominal pelvic sono as there was not order.  Gono/chlamyd neg on 7/14.  Since admission, she was started on PID protocol with IV Mefoxin q 8hrs and IV Doxy q  12hr. Gen Surg following SBO expectantly with bowel rest. Creatinine has improved with IV hydration and her WBC got worse to 32 but is improving now.  Her main complaint now is mid abdominal distention, colicky pain and some lower abdo and suprapubic pain. But it has improved some. I don't believe her improvement in pain is from depo provera injection since it takes 3 mos for beneficial effect on endometriosis but is improved with somewhat return of bowel function though its still not normal. She still has poor appetite and intermittent nausea, passing some flatus and liquid mucous like stool, she had one vomit after NG tube removed (self) on 7/15 and has not had any vomiting since.   For Anemia she received IV Feraheme, but has long standing chronic anemia.   On exam- she appears well, but concerned as she has never been this sick before.  VS stable.  Abdomen- supraumbilical distention with tympany, hypoactive bowel sounds, slight tenderness but no rebound/ guarding.  Pelvic exam was deferred.   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   Primary treatment in a relatively stable patients with non surgical abdomen is IV Antibiotics, regimen for PID is appropriate with Mefoxin/Doxy, total 14 days   It may help to follow CRP to assess success of medical treatment besides clinical improvement  MRI of pelvic is ordered after discussing with Dr Court Joy (primary service) to better assess the 3rd mass and I will consult Interventional Radiologist she is a candidate for percutaneous drainage to expedite recovery if MRI suggests  Tubo-ovarian abscess. If patient is worse/ more acute only then immediate surgical intervention (with Gen Surg assisting) is advised.   I have reviewed this with patient and she voices understanding.   -Azucena Fallen, MD (cell: [phone])  [facility] Answering service [phone]

## 2019-05-08 NOTE — Progress Notes (Signed)
Subjective:   [name] was seen in her room on rounds this AM. She states she is feeling okay today. She has had several soft BMs (larger than yesterday's) and continues to pass flatus. She report leg swelling starting today. She has been able to tolerate her liquid diet well without nausea the past 24 hours. She was encourage to keep up with her ambulation.  Objective:  Vital signs in last 24 hours: Vitals:   05/07/19 1403 05/07/19 2106 05/08/19 0507 05/08/19 1457  BP: (!) 143/88 132/83 134/89 (!) 142/86  Pulse: 81 86 85 92  Resp: 17 18 18 15   Temp: 98.5 F (36.9 C) 98.7 F (37.1 C) 99.7 F (37.6 C) 99.2 F (37.3 C)  TempSrc: Oral Oral Oral Oral  SpO2: 100% 98% 100% 100%  Weight:      Height:       Physical Exam Constitutional:      Appearance: Normal appearance.  Cardiovascular:     Rate and Rhythm: Normal rate and regular rhythm.     Pulses: Normal pulses.     Heart sounds: Normal heart sounds.  Pulmonary:     Effort: Pulmonary effort is normal. No respiratory distress.     Breath sounds: Normal breath sounds.  Abdominal:     General: Bowel sounds are normal. There is distension.     Comments: Mild tenderness to palpation of abdomen diffusely  Musculoskeletal:        General: No deformity or signs of injury.     Right lower leg: Edema present.     Left lower leg: Edema present.  Skin:    General: Skin is warm and dry.  Neurological:     General: No focal deficit present.     Mental Status: She is alert. Mental status is at baseline.    Assessment/Plan:  Active Problems:   Pelvic mass   Small bowel obstruction ([place])  33 year old female who p/w worsening of abdominal pain and constipation. CT showed stable L adnexal mass and R adnexal mass with interval development measures 6.8 x 4.9 cm. Patient experiencing nausea and vomiting for 2 weeks PTA and was found to have possible SBO.  #SBO vs Ileus: Pt pulled out NGT 7/16. She reports soft stool over the past  day, continued flatus, and no nausea. She remains mildly distended and tender on abdominal exam, but continues to improve. She is continues to work on ambulation. - Appreciate general surgery recommendations - Morning x-ray - Ambulate - Clear liquid diet  #Pelvic Mass, Leukocytosis Patient with history of endometriosis treated with low OCP.Ob/Gyn has consulted and following.  Patient currently on Doxy and cefoxitin for suspected PID. WBC downtrending on 7/18 to 23. Afebrile today. MRI Pelvis ordered to evaluate for tubo-ovarian abscess, Likely IR consult to drain if abscess present. Working to avoid surgery to get patient to specialist for fertility preserving surgery. - Appreciate OB/Gyn recommendations - Doxy and cefoxitin (Day 6/14) for PID  - Dilaudid 1mg , q2h PRN, Severe Pain -Toradol 15mg  IV, q8h PRN, Mod Pain - MRI Pelvis to evaluate for tubo-ovarian abscess - Zofran PRN - AM CBC and CRP  #AKI: Creatinine 2.79 >>> 1.61. Baseline ~1.01.Has improved with IVF. She does remain on Toradol so will monitor closely. IVF stopped today due to increased PO intake and LE edema. - AM BMP  #Edema: Patient to have bilateral LE edema to her Knees this AM. Suspect multifactorial given continuous IVF, Increased PO intake, increased ambulation, and possibly mass effect on IVC.  She continues to urinate well. Will stop fluids and allow patient to resorb 3rd space fluids for now. - Stop IVF - Continue ambulation - Rest on Left side when in bed  #Microcytic Anemia: Hgb 8.4, MCV 73, TIBC low 238. Iron 41. Ferritin 393. Elevated retics. Suspect chronic Iron deficiency and acute inflammatory state. Hemoglobin in Nov 2019 ~8.1. Patient remains asymptomatic. S/P Feraheme 7/17. - AM CBC  Diet: Clear Liquid VTE: Heparin Code: Full  #Dispo: Anticipated discharge in approximately3-4day(s).  Pearson Grippe, DO IM PGY-3

## 2019-05-09 ENCOUNTER — Inpatient Hospital Stay (HOSPITAL_COMMUNITY): Payer: BC Managed Care – PPO

## 2019-05-09 LAB — CBC
HCT: 22.6 % — ABNORMAL LOW (ref 36.0–46.0)
Hemoglobin: 7.4 g/dL — ABNORMAL LOW (ref 12.0–15.0)
MCH: 24.8 pg — ABNORMAL LOW (ref 26.0–34.0)
MCHC: 32.7 g/dL (ref 30.0–36.0)
MCV: 75.8 fL — ABNORMAL LOW (ref 80.0–100.0)
Platelets: 491 10*3/uL — ABNORMAL HIGH (ref 150–400)
RBC: 2.98 MIL/uL — ABNORMAL LOW (ref 3.87–5.11)
RDW: 17.8 % — ABNORMAL HIGH (ref 11.5–15.5)
WBC: 18.5 10*3/uL — ABNORMAL HIGH (ref 4.0–10.5)
nRBC: 0.1 % (ref 0.0–0.2)

## 2019-05-09 LAB — PROTIME-INR
INR: 1.2 (ref 0.8–1.2)
Prothrombin Time: 15.1 seconds (ref 11.4–15.2)

## 2019-05-09 LAB — BASIC METABOLIC PANEL
Anion gap: 7 (ref 5–15)
BUN: 5 mg/dL — ABNORMAL LOW (ref 6–20)
CO2: 21 mmol/L — ABNORMAL LOW (ref 22–32)
Calcium: 8.5 mg/dL — ABNORMAL LOW (ref 8.9–10.3)
Chloride: 110 mmol/L (ref 98–111)
Creatinine, Ser: 1.34 mg/dL — ABNORMAL HIGH (ref 0.44–1.00)
GFR calc Af Amer: >60 mL/min (ref >60)
GFR calc non Af Amer: 53 mL/min — ABNORMAL LOW (ref >60)
Glucose, Bld: 101 mg/dL — ABNORMAL HIGH (ref 70–99)
Potassium: 3.2 mmol/L — ABNORMAL LOW (ref 3.5–5.1)
Sodium: 138 mmol/L (ref 135–145)

## 2019-05-09 LAB — C-REACTIVE PROTEIN: CRP: 13 mg/dL — ABNORMAL HIGH (ref <1.0)

## 2019-05-09 LAB — ABO/RH: ABO/RH(D): O NEG

## 2019-05-09 MED ORDER — POTASSIUM CHLORIDE 20 MEQ PO PACK
40.0000 meq | PACK | Freq: Two times a day (BID) | ORAL | Status: DC
  Administered 2019-05-10: 40 meq via ORAL
  Filled 2019-05-09 (×2): qty 2

## 2019-05-09 MED ORDER — KCL-LACTATED RINGERS-D5W 20 MEQ/L IV SOLN
INTRAVENOUS | Status: DC
  Administered 2019-05-09 – 2019-05-10 (×3): via INTRAVENOUS
  Filled 2019-05-09 (×3): qty 1000

## 2019-05-09 NOTE — Progress Notes (Signed)
GYN:  IV cefoxitin/doxycycline S/p feraheme  S: c/o short of breath with ambulation. Felt it started since hospital stay (+) heart rate increase with ambulation. Noted abdominal pain Lying on left side with heating pad Interested in drainage of abscess  O; BP (!) 137/96 (BP Location: Right Arm)   Pulse 88   Temp 98.5 F (36.9 C) (Oral)   Resp 18   Ht 5\' 4"  (1.626 m)   Wt 87.5 kg   SpO2 100%   BMI 33.13 kg/m  Lungs clear to A Cor RRR  abdomen: obese soft active BS tender Pelvic deferred Extr: +1 edema  CBC Latest Ref Rng & Units 05/09/2019 05/07/2019 05/06/2019  WBC 4.0 - 10.5 K/uL 18.5(H) 23.6(H) 32.2(H)  Hemoglobin 12.0 - 15.0 g/dL 7.4(L) 7.7(L) 7.8(L)  Hematocrit 36.0 - 46.0 % 22.6(L) 24.0(L) 23.2(L)  Platelets 150 - 400 K/uL 491(H) 322 299  Ct Abdomen Pelvis Wo Contrast  Result Date: 05/02/2019 CLINICAL DATA:  Right-sided back pain. EXAM: CT ABDOMEN AND PELVIS WITHOUT CONTRAST TECHNIQUE: Multidetector CT imaging of the abdomen and pelvis was performed following the standard protocol without IV contrast. COMPARISON:  Abdominal CT September 09, 2018, pelvic MRI December 02, 2018 FINDINGS: Lower chest: Right middle and lower lobe atelectasis versus scarring. 4 mm ground-glass nodule in the right upper lobe, image 2/26, sequence 7. Hepatobiliary: No focal liver abnormality is seen. No gallstones, gallbladder wall thickening, or biliary dilatation. Pancreas: Unremarkable. No pancreatic ductal dilatation or surrounding inflammatory changes. Spleen: Normal in size without focal abnormality. Adrenals/Urinary Tract: Adrenal glands are unremarkable. Kidneys are normal, without renal calculi, focal lesion, or hydronephrosis. Bladder is unremarkable. Stomach/Bowel: Numerous prominent, but sub pathologic by CT criteria small bowel loops with gas fluid levels. The colon is decompressed. Vascular/Lymphatic: No significant vascular findings are present. No enlarged abdominal or pelvic lymph nodes.  Shotty retroperitoneal lymph nodes, sub pathologic by CT criteria and nonspecific. Reproductive: There is a complex cystic lesion associated with the left adnexa, which measures 5.6 cm in greatest dimension, relatively stable. There is a complex cystic lesion associated with the right adnexa which measures approximately 4 poor in 9 cm, also relatively stable from the prior CT. However, there has been an interval development of a new intermediate density mass in the right pelvis which measures 6.8 x 4.9 cm, image 79/98, sequence 3. A connection to the right adnexa is suspected. The uterus is normal. Other: No abdominal wall hernia or abnormality. No abdominopelvic ascites. Musculoskeletal: Ill-defined sclerotic lesion within the body of the sternum measures 9 mm, image 7/98, sequence 3. 2 smaller sclerotic foci are present within the left ilium, measuring 4 and 5 mm, image 66/98, sequence 3. IMPRESSION: 1. Numerous mildly distended, technically sub pathologic by CT criteria, small bowel loops with gas fluid levels. The colon is decompressed. The findings are nonspecific but may represent early/low-grade small bowel obstruction versus ileus. 2. Known bilateral complex cystic adnexal masses, with interval development of a third 6.8 cm intermediate density mass in the right pelvis, which likely arises in the right adnexa. Further evaluation with pelvic ultrasound or MRI without and with contrast, and OBGYN consultation may be considered. 3. Ill-defined sclerotic foci within the body of the sternum and left ilium, which may represent bone islands, however osseous metastatic disease cannot be excluded. 4. Right middle and lower lobe atelectasis versus scarring. 4 mm ground-glass nodule in the right upper lobe. No follow-up needed if patient is low-risk. Non-contrast chest CT can be considered in 12 months if patient  is high-risk. This recommendation follows the consensus statement: Guidelines for Management of Incidental  Pulmonary Nodules Detected on CT Images: From the Fleischner Society 2017; Radiology 2017; 284:228-243. These results were called by telephone at the time of interpretation on 05/02/2019 at 11:14 am to Dr. Virgel Manifold , who verbally acknowledged these results. Electronically Signed   By: Fidela Salisbury M.D.   On: 05/02/2019 11:17   Dg Abd 1 View  Result Date: 05/09/2019 CLINICAL DATA:  Right lower quadrant abdominal pain. Bowel obstruction. EXAM: ABDOMEN - 1 VIEW COMPARISON:  05/08/2019; 05/07/2019; CT abdomen pelvis-05/02/2019 FINDINGS: Grossly unchanged gaseous distension of several rather featureless loops of presumed small bowel within the right upper abdominal quadrant with index loop measuring approximately 5.3 cm in diameter. There is persistent patulous distension of several loops of small bowel within the left mid hemiabdomen with index loop measuring approximately 3.6 cm in diameter. These findings are again associated with a conspicuous paucity of distal colonic gas. No pneumoperitoneum, pneumatosis or portal venous gas. Punctate phleboliths overlie the left hemipelvis. Otherwise, no definitive abnormal intra-abdominal calcifications. No acute osseous abnormalities. IMPRESSION: Similar findings again worrisome for small bowel obstruction. Clinical correlation is advised. Electronically Signed   By: Sandi Mariscal M.D.   On: 05/09/2019 07:57   Dg Abd 1 View  Result Date: 05/08/2019 CLINICAL DATA:  Bowel obstruction; abdomen pain, vomiting since 05/02/19. Ovarian cysts. Hx of IBS, endometriosis, constipation. EXAM: ABDOMEN - 1 VIEW COMPARISON:  Plain film of the abdomen dated 05/07/2019. CT abdomen dated 05/02/2019. FINDINGS: Persistent mildly prominent gas-filled loops of small bowel within the LEFT upper quadrant. Overall bowel gas pattern is stable. No evidence of soft tissue mass or abnormal fluid collection. No evidence of free intraperitoneal air. Osseous structures are unremarkable.  IMPRESSION: Stable plain film exam of the abdomen. Persistent mildly prominent gas-filled loops of small bowel within the LEFT upper quadrant. Bowel gas pattern appears otherwise nonobstructive. Electronically Signed   By: Franki Cabot M.D.   On: 05/08/2019 09:57   Dg Abd 1 View  Result Date: 05/07/2019 CLINICAL DATA:  Bowel obstruction. EXAM: ABDOMEN - 1 VIEW COMPARISON:  Plain films of the abdomen dated 05/06/2019 and 05/05/2019. CT abdomen dated 05/02/2019. FINDINGS: Persistent mildly distended gas-filled loops of small bowel throughout the central abdomen, similar to the appearance on earlier CT. Air is present within the transverse colon. No evidence of soft tissue mass or abnormal fluid collection. No evidence of free intraperitoneal air. Osseous structures are unremarkable. IMPRESSION: Persistent mildly distended gas-filled loops of small bowel throughout the central abdomen, similar to the appearance on earlier CT, compatible with the partial small bowel obstruction versus ileus described on earlier CT. Electronically Signed   By: Franki Cabot M.D.   On: 05/07/2019 08:12   Dg Abd 1 View  Result Date: 05/06/2019 CLINICAL DATA:  Bowel obstruction EXAM: ABDOMEN - 1 VIEW COMPARISON:  May 05, 2019 FINDINGS: There are loops of borderline dilated small bowel in the left upper abdomen. No other bowel dilatation evident. No air-fluid levels appreciable. No free air. No abnormal calcifications. IMPRESSION: Mild dilatation of small bowel in the proximal jejunal region. No other bowel dilatation. Question a degree of enteritis or ileus. Bowel obstruction felt to be somewhat less likely. No free air. Electronically Signed   By: Lowella Grip III M.D.   On: 05/06/2019 08:54   Dg Abd 1 View  Result Date: 05/05/2019 CLINICAL DATA:  Right-sided abdominal pain, small bowel obstruction. EXAM: ABDOMEN - 1 VIEW COMPARISON:  05/04/2019 FINDINGS: Interval removal of enteric tube. There are a few air-filled  minimally dilated small bowel loops without significant change. Air is present throughout the colon. No free peritoneal air. Remainder of the exam is unchanged. IMPRESSION: A few persistent air-filled minimally dilated small bowel loops with air throughout the colon. Findings may be due to ongoing ileus versus partial small bowel obstructive process. Electronically Signed   By: Marin Olp M.D.   On: 05/05/2019 09:32   Dg Abd 1 View  Result Date: 05/03/2019 CLINICAL DATA:  Nasogastric tube placement. EXAM: ABDOMEN - 1 VIEW COMPARISON:  Earlier today. FINDINGS: Interval nasogastric tube with its tip in the proximal stomach and side hole in the proximal to mid stomach. No significant change in multiple dilated small bowel loops. Poor inspiration without significant change right basilar volume loss and airspace opacity compatible with atelectasis. Minimal scoliosis. IMPRESSION: 1. Nasogastric tube tip in the proximal stomach and side hole in the proximal to mid stomach. 2. Stable small bowel ileus or partial obstruction. Electronically Signed   By: Claudie Revering M.D.   On: 05/03/2019 15:06   Mr Pelvis W Wo Contrast  Result Date: 05/08/2019 CLINICAL DATA:  Pelvic inflammatory disease, pyosalpinx. Cystic adnexal lesions. EXAM: MRI PELVIS WITHOUT AND WITH CONTRAST TECHNIQUE: Multiplanar multisequence MR imaging of the pelvis was performed both before and after administration of intravenous contrast. CONTRAST:  9 cc Gadavist COMPARISON:  12/02/2018 FINDINGS: Urinary Tract:  Unremarkable Bowel: Scattered air-fluid levels in mildly dilated loops of right abdominal small bowel. Vascular/Lymphatic: Unremarkable Reproductive: Bilateral serpentine and cystic adnexal lesions are present and ill-defined against a background of generalized edema tracking along fascia planes in the pelvis, and scattered additional lesions with internal complex fluid and marginal thick enhancement which are difficult to separate from bowel.  Given the tubular appearance of the adnexal collections I do think that complex hydrosalpinx or pyosalpinx is a differential diagnostic consideration. The dominant left-sided lesion measures 8.3 by 4.3 by 4.1 cm (volume = 77 cm^3) and has mixed high and low T2 signal and mixed high and low T1 signal. The dominant right-sided lesion measures 4.8 by 3.0 by 4.4 cm (volume = 33 cm^3)in likewise demonstrates mixed internal signal intensity and somewhat thickened enhancing margins. The uterus is mildly low in position which may be a manifestation of mild pelvic floor laxity. The uterus measures 7.9 by 4.6 by 5.2 cm (volume = 99 cm^3). Thickened junctional zone on T2 weighted images suggests adenomyosis. The ovaries themselves are obscured by the surrounding adnexal cystic lesions and are difficult to the measure. Other: I suspect that there are also multiple rim enhancing additional lesions in the pelvis. For example, adjacent to the cecum along the right paracolic border, a 5.9 by 3.7 by 6.2 cm fluid collection with enhancing margins is present. This is adjacent to a 4.2 by 1.5 cm collection on image 11/13. Along the left paracolic gutter, a cystic lesion with thick enhancing margins measures 2 point 7 by 2.0 cm on image 53/13, and there are multiple small presumed abscesses in the perirectal space shown on images 61-93 of series 13. Additional possible small abscess in the upper abdomen measuring 3.5 by 1.4 cm on image 19/13 although this could conceivably represent a loop of bowel rather than an abscess. Above the urinary bladder eccentric to the right I suspect that there is a 6.3 by 3.1 by 3.9 cm abscess potentially containing gas, nestled below a loop of bowel. Musculoskeletal: Edema along the umbilicus. Low T2  signal intensity in the marrow, cannot exclude hemosiderosis. IMPRESSION: 1. Complex appearance with bilateral serpentine and cystic adnexal lesions probably representing pyosalpinx bilaterally, as well as  scattered collections with thick enhancing margins along the lower paracolic gutters, perirectal space, and above the urinary bladder suspicious for abscesses. 2. Uterine adenomyosis. No definite thickening of the endometrium currently. 3. Cannot exclude hemosiderosis given the low T2 signal intensity in the marrow. 4. Scattered air-levels in mildly dilated loops of right-sided small bowel, nonspecific. Electronically Signed   By: Van Clines M.D.   On: 05/08/2019 15:15   US Pelvis Transvaginal Non-ob (tv Only)  Result Date: 05/06/2019 CLINICAL DATA:  33 year old female with a history of complex bilateral adnexal masses, suspected to represent endometriomas on prior MRI pelvis, presenting for follow-up. History of endometriosis. Uncertain LMP. EXAM: ULTRASOUND PELVIS TRANSVAGINAL TECHNIQUE: Transvaginal ultrasound examination of the pelvis was performed including evaluation of the uterus, ovaries, adnexal regions, and pelvic cul-de-sac. COMPARISON:  05/02/2019 CT abdomen/pelvis.  12/02/2018 MRI pelvis. FINDINGS: Uterus Measurements: 10.9 x 3.3 x 5.1 cm = volume: 98 mL. Retroverted and anteflexed uterus is normal in size and configuration, with no uterine fibroids or other myometrial abnormalities. Endometrium Thickness: 10 mm. No endometrial cavity fluid or focal endometrial mass. Right ovary Measurements: 10.6 x 4.6 x 4.3 cm = volume: 110 mL. There are multiple complex right ovarian cysts demonstrating variable low level internal echoes, largest 3.2 x 3.1 x 1.6 cm, compared to 3.1 x 2.9 x 1.5 cm on 12/02/2018 MRI using similar measurement technique, similar. No appreciable internal vascularity within the right ovarian cysts on Doppler. Left ovary Measurements: 8.4 x 5.6 x 4.9 cm = volume: 123 mL. There are multiple complex left ovarian cysts demonstrating variable low level internal echoes, largest 4.5 x 3.4 x 4.0 cm, compared to 4.1 x 2.7 x 3.4 cm on 12/02/2018 MRI using similar measurement technique,  similar to mildly increased. No appreciable internal vascularity within the left ovarian cysts on Doppler. Other findings:  No abnormal free fluid. IMPRESSION: 1. Multiple complex bilateral ovarian cysts with variable low level internal echoes and with no appreciable internal vascularity on color Doppler, similar to mildly increased in size since 12/02/2018 pelvic MRI study, where they were characterized as endometriomas. 2. Please note that the new 6.8 x 4.9 cm low-attenuation anterior pelvic mass visualized superior to the right bladder on the recent 05/02/2027 unenhanced CT study cannot be evaluated on this transvaginal-only pelvic ultrasound study (transabdominal pelvic ultrasound study was not ordered). Recommend further evaluation of this pelvic mass with MRI pelvis without and with IV contrast. Alternatively, CT abdomen/pelvis performed with oral and IV contrast could be obtained initially. 3. Normal retroverted uterus. Electronically Signed   By: Ilona Sorrel M.D.   On: 05/06/2019 12:57   Dg Abd 2 Views  Result Date: 05/03/2019 CLINICAL DATA:  Bilateral lower abdominal pain and low back spasms causing shortness of breath. EXAM: ABDOMEN - 2 VIEW COMPARISON:  CT abdomen dated 05/02/2019. FINDINGS: Persistent gaseous distention of small-bowel loops within the central abdomen, stable to increased compared to yesterday's CT, with associated air-fluid levels suggesting mechanical obstruction. Suspect some new distension of the large bowel. No evidence of free intraperitoneal air. No evidence of renal or ureteral calculi. IMPRESSION: Evidence of bowel obstruction, possibly large bowel as well as small bowel involvement. These results will be called to the ordering clinician or representative by the Radiologist Assistant, and communication documented in the PACS or zVision Dashboard. Electronically Signed   By: Cherlynn Kaiser  Enriqueta Shutter M.D.   On: 05/03/2019 13:03   Dg Abd Portable 1v  Result Date: 05/04/2019 CLINICAL  DATA:  Abdominal pain with recent bowel obstruction EXAM: PORTABLE ABDOMEN - 1 VIEW COMPARISON:  May 04, 2019 study obtained earlier in the day FINDINGS: Nasogastric tube tip and side port are in the stomach. There remain loops of mildly dilated bowel without air-fluid levels. No free air. Dilute contrast remains within loops of small bowel. No abnormal calcifications. IMPRESSION: Nasogastric tube tip and side port in stomach. Bowel appear similar to earlier in the day with mildly dilated bowel, likely due to persistent degree of bowel obstruction. No free air. Electronically Signed   By: Lowella Grip III M.D.   On: 05/04/2019 17:15   Dg Abd Portable 1v-small Bowel Obstruction Protocol-initial, 8 Hr Delay  Result Date: 05/04/2019 CLINICAL DATA:  Small bowel protocol, 8 hour delay EXAM: PORTABLE ABDOMEN - 1 VIEW COMPARISON:  05/03/2019, 05/02/2019 FINDINGS: Esophageal tube tip is in the left upper quadrant. Persistent dilatation of primarily small bowel, measuring up to 4.6 cm. Dilute contrast within dilated small bowel loops. No definitive contrast within the colon. Curvilinear metallic opacity over the central abdomen may represent external artifact IMPRESSION: Findings consistent with small bowel obstruction. Dilute contrast within dilated loops of small bowel. Electronically Signed   By: Donavan Foil M.D.   On: 05/04/2019 02:20   CMP Latest Ref Rng & Units 05/09/2019 05/07/2019 05/06/2019  Glucose 70 - 99 mg/dL 101(H) 107(H) 164(H)  BUN 6 - 20 mg/dL 5(L) 13 20  Creatinine 0.44 - 1.00 mg/dL 1.34(H) 1.29(H) 1.62(H)  Sodium 135 - 145 mmol/L 138 139 141  Potassium 3.5 - 5.1 mmol/L 3.2(L) 3.4(L) 4.0  Chloride 98 - 111 mmol/L 110 113(H) 114(H)  CO2 22 - 32 mmol/L 21(L) 19(L) 21(L)  Calcium 8.9 - 10.3 mg/dL 8.5(L) 8.2(L) 8.4(L)  Total Protein 6.5 - 8.1 g/dL - - -  Total Bilirubin 0.3 - 1.2 mg/dL - - -  Alkaline Phos 38 - 126 U/L - - -  AST 15 - 41 U/L - - -  ALT 0 - 44 U/L - - -  IMP: 1)bilateral  tubo-ovarian complexes Currently on mefoxin/doxycycline  With decreasing wbc Disc case with Dr Vernard Gambles ( IR) . Appears amenable to drainage. Will proceed for diganostic and therapeutic purpose. Cont antibiotics( need 2 wk total) 2) Symptomatic anemia. Pt would benefit from 2u PRBC with follow 2nd IV feraheme one wk post last dose Will have type and screen/cross done. Pt agrees with transfusion 3) hypokalemia  will replete with oral once intake resumes  Dr Court Joy notified of  Plan 1, 2 4) partial SBO vs ileus improved

## 2019-05-09 NOTE — Progress Notes (Addendum)
Subjective:  Patient was examined at the bedside this morning while resting comfortably on the side of her bed. Patient documents that she has been able to walk throughout the day. In addition, she has been having bowel movements every hour but notes that they are not large movements. Her pain control has improved - she only has to use dilaudid a couple times a day.  Notices green urine. We explained to her that it was likely due to her medication called Urelle.  Explained to patient that MRI overnight discovered abscesses that could alter her current treatment plan. Current plan is to have these abscesses drained by IR team if her condition does not continue to improve.  Questions if there are any changes for her diet. We discussed keeping her diet as is since she is having bowel movements and abdominal xrays have not demonstrated significant change of her obstruction.   Objective:  Vital signs in last 24 hours: Vitals:   05/08/19 0507 05/08/19 1457 05/08/19 2151 05/09/19 0552  BP: 134/89 (!) 142/86 139/86 (!) 137/96  Pulse: 85 92 92 88  Resp: 18 15 18 18   Temp: 99.7 F (37.6 C) 99.2 F (37.3 C) 99.1 F (37.3 C) 98.5 F (36.9 C)  TempSrc: Oral Oral Oral Oral  SpO2: 100% 100% 100% 100%  Weight:      Height:       Physical Exam Constitutional:      General: She is not in acute distress. Cardiovascular:     Rate and Rhythm: Normal rate and regular rhythm.     Comments: No JVD Pulmonary:     Effort: Pulmonary effort is normal.     Breath sounds: Normal breath sounds.  Abdominal:     Tenderness: There is abdominal tenderness (Tender to deep palpation > RLQ). There is no guarding or rebound.     Comments: Bowel sound present.   Musculoskeletal:     Right lower leg: Edema present.     Left lower leg: Edema present.  Neurological:     Mental Status: She is alert.     Assessment/Plan:  Active Problems:   Pelvic mass   Small bowel obstruction ([place])  33 year old  female who p/w worsening of abdominal pain and constipation.  CT showed stable L adnexal mass and R adnexal mass with interval development measures 6.8 x 4.9 cm.Patient experiencing nausea and vomiting for 2 weeks PTA and was found to have possible SBO. Patient had an NG tube placed but pulled it out after a day . We continue to follow small bowel obstruction with serial x-rays.  Patient having small bowel movements.  Patient also being followed for AKI, creatinine downtrended with IV fluids.  Patient followed at [facility] for complex tubo-ovarian complex cyst likely from prior history of PID.  Unable to evaluate further with transvaginal ultrasound on 7/17.  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  Also scattered collection of thick enhancing margins along the peri-colic gutters perirectal space and urinary bladder suspicious for abscesses.   #SBOvs Ileus: She is continues to work on ambulation.  Patient has not asked for any IV Dilaudid today.  X-ray this morning is unchanged from prior x-ray.  Bowel sounds present on exam abdomen is less distended.  Continues to have small bowel movements.  Denies nausea and vomiting - Appreciategeneralsurgery recommendations - Morning x-ray - Ambulate - Clear liquid diet  #Pelvic Mass, Leukocytosis Patient has a history of pelvic  inflammatory disease.  Gonorrhea chlamydia test negative on this stay.  Patient with history of endometriosis treated with lowOCP.Ob/Gyn has consulted and following.Patient currently on Doxy and cefoxitin for suspected PID. WBC continues to downtrend to 18.5 today.  Afebrile.  Will consult IR to drain abscesses if condition worsens per OB/GYN recommendation. Working to avoid surgery in this hospital stay so patient can meet with Dr.Yalcinkaya , reported to be in late August.  - Appreciate OB/Gyn recommendations - Doxy and cefoxitin (Day  7/14) for PID  - Dilaudid 1mg , q2h PRN, Severe Pain -Toradol 15mg  IV, q8h PRN, Mod Pain - Zofran PRN - AM CBC  #AKI: Creatinine 2.79 >>> 1.34. Baseline ~1.01.Has improved with IVF. She does remain on Toradol so will monitor closely. IVF stopped due to PO intake and LE edema.  - AM BMP  #Edema: Patient to have bilateral LE edema to her Knees this AM. Suspect multifactorial given continuous IVF, Increased PO intake, increased ambulation, and possibly mass effect on IVC.  Net -1.7 L overnight..  +8.8 L on admission.  Will stop fluids and allow patient to resorb 3rd space fluids for now. - Stop IVF - Continue ambulation - Rest on Left side when in bed  #Microcytic Anemia: Hgb 8.4, MCV 73, TIBC low 238. Iron 41.Ferritin 393.Elevated retics. Suspect chronic Iron deficiency and acute inflammatory state. Hemoglobin in Nov 2019 ~8.1. Patient remains asymptomatic. S/P Feraheme 7/17. - AM CBC  Diet: Clear Liquid VTE: Heparin Code: Full  #Dispo: Anticipated discharge in approximately3-4day(s).    Tamsen Snider, MD PGY1  [phone]

## 2019-05-09 NOTE — Consult Note (Signed)
Chief Complaint: Patient was seen in consultation today for tubo-ovarian abscess  Referring Physician(s): Dr. Garwin Brothers  Supervising Physician: Aletta Edouard  Patient Status: [facility] - In-pt  History of Present Illness: Gwendolyn Nguyen is a 33 y.o. female with past medical history of back pain, endometriosis, PID who presented to [facility] ED with abdominal pain. Patient found to have complex, bilateral adnexal lesions, likely pyosalpinx bilaterally with scattered fluid collection suspicious for abscesses.  IR consulted for aspiration and drainage at the request of Dr. Garwin Brothers.  Case reviewed by Dr. Vernard Gambles who approves patient for procedure.   Patient assessed this afternoon.  She has a heating pad which provides some relief but states "my ovaries hurt."  Afebrile.  WBC 18.5.  On Mefoxin and doxycycline IV.   Past Medical History:  Diagnosis Date   Anemia    Back pain    Constipation    Endometriosis    IBS (irritable bowel syndrome)    Low hemoglobin    Multiple food allergies     History reviewed. No pertinent surgical history.  Allergies: Gadolinium derivatives, Shellfish allergy, and Sulfa antibiotics  Medications: Prior to Admission medications   Medication Sig Start Date End Date Taking? Authorizing Provider  Black Currant Seed Oil 500 MG CAPS Take 1 capsule by mouth daily.   Yes [provider]  ferrous sulfate 325 (65 FE) MG tablet Take 325 mg by mouth daily with breakfast.   Yes [provider]  Peppermint Oil (IBGARD PO) Take 1 capsule by mouth daily.   Yes [provider]  vitamin E 1000 UNIT capsule Take 1,000 Units by mouth daily.   Yes [provider]     Family History  Problem Relation Age of Onset   Hyperlipidemia Mother    Hypertension Mother    Obesity Mother    Hyperlipidemia Father    Hypertension Father    Obesity Father     Social History   Socioeconomic History   Marital status: Single   Spouse name: Not on file   Number of children: Not on file   Years of education: Not on file   Highest education level: Not on file  Occupational History   Occupation: Social worker  Social Needs   Financial resource strain: Not on file   Food insecurity    Worry: Not on file    Inability: Not on file   Transportation needs    Medical: Not on file    Non-medical: Not on file  Tobacco Use   Smoking status: Never Smoker   Smokeless tobacco: Never Used  Substance and Sexual Activity   Alcohol use: Yes    Comment: occassional   Drug use: Not on file   Sexual activity: Not on file  Lifestyle   Physical activity    Days per week: Not on file    Minutes per session: Not on file   Stress: Not on file  Relationships   Social connections    Talks on phone: Not on file    Gets together: Not on file    Attends religious service: Not on file    Active member of club or organization: Not on file    Attends meetings of clubs or organizations: Not on file    Relationship status: Not on file  Other Topics Concern   Not on file  Social History Narrative   Not on file     Review of Systems: A 12 point ROS discussed and pertinent positives are indicated  in the HPI above.  All other systems are negative.  Review of Systems  Constitutional: Negative for fatigue and fever.  Respiratory: Negative for cough and shortness of breath.   Cardiovascular: Negative for chest pain.  Gastrointestinal: Positive for abdominal pain and diarrhea.  Genitourinary: Positive for pelvic pain. Negative for dysuria.  Psychiatric/Behavioral: Negative for behavioral problems and confusion.    Vital Signs: BP 136/87 (BP Location: Right Arm)    Pulse 96    Temp 98.7 F (37.1 C) (Oral)    Resp 18    Ht 5\' 4"  (1.626 m)    Wt 193 lb (87.5 kg)    SpO2 100%    BMI 33.13 kg/m   Physical Exam Vitals signs and nursing note reviewed.  Constitutional:      Appearance: Normal appearance.  HENT:      Mouth/Throat:     Mouth: Mucous membranes are moist.     Pharynx: Oropharynx is clear.  Cardiovascular:     Rate and Rhythm: Normal rate and regular rhythm.     Heart sounds: No murmur. No friction rub. No gallop.   Pulmonary:     Effort: Pulmonary effort is normal. No respiratory distress.     Breath sounds: Normal breath sounds.  Abdominal:     Palpations: Abdomen is soft.     Tenderness: There is abdominal tenderness.  Skin:    General: Skin is warm and dry.  Neurological:     General: No focal deficit present.     Mental Status: She is alert and oriented to person, place, and time. Mental status is at baseline.  Psychiatric:        Mood and Affect: Mood normal.        Behavior: Behavior normal.        Thought Content: Thought content normal.        Judgment: Judgment normal.      MD Evaluation Airway: WNL Heart: WNL Abdomen: WNL Chest/ Lungs: WNL ASA  Classification: 3 Mallampati/Airway Score: One   Imaging: Ct Abdomen Pelvis Wo Contrast  Result Date: 05/02/2019 CLINICAL DATA:  Right-sided back pain. EXAM: CT ABDOMEN AND PELVIS WITHOUT CONTRAST TECHNIQUE: Multidetector CT imaging of the abdomen and pelvis was performed following the standard protocol without IV contrast. COMPARISON:  Abdominal CT September 09, 2018, pelvic MRI December 02, 2018 FINDINGS: Lower chest: Right middle and lower lobe atelectasis versus scarring. 4 mm ground-glass nodule in the right upper lobe, image 2/26, sequence 7. Hepatobiliary: No focal liver abnormality is seen. No gallstones, gallbladder wall thickening, or biliary dilatation. Pancreas: Unremarkable. No pancreatic ductal dilatation or surrounding inflammatory changes. Spleen: Normal in size without focal abnormality. Adrenals/Urinary Tract: Adrenal glands are unremarkable. Kidneys are normal, without renal calculi, focal lesion, or hydronephrosis. Bladder is unremarkable. Stomach/Bowel: Numerous prominent, but sub pathologic by CT criteria  small bowel loops with gas fluid levels. The colon is decompressed. Vascular/Lymphatic: No significant vascular findings are present. No enlarged abdominal or pelvic lymph nodes. Shotty retroperitoneal lymph nodes, sub pathologic by CT criteria and nonspecific. Reproductive: There is a complex cystic lesion associated with the left adnexa, which measures 5.6 cm in greatest dimension, relatively stable. There is a complex cystic lesion associated with the right adnexa which measures approximately 4 poor in 9 cm, also relatively stable from the prior CT. However, there has been an interval development of a new intermediate density mass in the right pelvis which measures 6.8 x 4.9 cm, image 79/98, sequence 3. A connection to the  right adnexa is suspected. The uterus is normal. Other: No abdominal wall hernia or abnormality. No abdominopelvic ascites. Musculoskeletal: Ill-defined sclerotic lesion within the body of the sternum measures 9 mm, image 7/98, sequence 3. 2 smaller sclerotic foci are present within the left ilium, measuring 4 and 5 mm, image 66/98, sequence 3. IMPRESSION: 1. Numerous mildly distended, technically sub pathologic by CT criteria, small bowel loops with gas fluid levels. The colon is decompressed. The findings are nonspecific but may represent early/low-grade small bowel obstruction versus ileus. 2. Known bilateral complex cystic adnexal masses, with interval development of a third 6.8 cm intermediate density mass in the right pelvis, which likely arises in the right adnexa. Further evaluation with pelvic ultrasound or MRI without and with contrast, and OBGYN consultation may be considered. 3. Ill-defined sclerotic foci within the body of the sternum and left ilium, which may represent bone islands, however osseous metastatic disease cannot be excluded. 4. Right middle and lower lobe atelectasis versus scarring. 4 mm ground-glass nodule in the right upper lobe. No follow-up needed if patient is  low-risk. Non-contrast chest CT can be considered in 12 months if patient is high-risk. This recommendation follows the consensus statement: Guidelines for Management of Incidental Pulmonary Nodules Detected on CT Images: From the Fleischner Society 2017; Radiology 2017; 284:228-243. These results were called by telephone at the time of interpretation on 05/02/2019 at 11:14 am to Dr. Virgel Manifold , who verbally acknowledged these results. Electronically Signed   By: Fidela Salisbury M.D.   On: 05/02/2019 11:17   Dg Abd 1 View  Result Date: 05/09/2019 CLINICAL DATA:  Right lower quadrant abdominal pain. Bowel obstruction. EXAM: ABDOMEN - 1 VIEW COMPARISON:  05/08/2019; 05/07/2019; CT abdomen pelvis-05/02/2019 FINDINGS: Grossly unchanged gaseous distension of several rather featureless loops of presumed small bowel within the right upper abdominal quadrant with index loop measuring approximately 5.3 cm in diameter. There is persistent patulous distension of several loops of small bowel within the left mid hemiabdomen with index loop measuring approximately 3.6 cm in diameter. These findings are again associated with a conspicuous paucity of distal colonic gas. No pneumoperitoneum, pneumatosis or portal venous gas. Punctate phleboliths overlie the left hemipelvis. Otherwise, no definitive abnormal intra-abdominal calcifications. No acute osseous abnormalities. IMPRESSION: Similar findings again worrisome for small bowel obstruction. Clinical correlation is advised. Electronically Signed   By: Sandi Mariscal M.D.   On: 05/09/2019 07:57   Dg Abd 1 View  Result Date: 05/08/2019 CLINICAL DATA:  Bowel obstruction; abdomen pain, vomiting since 05/02/19. Ovarian cysts. Hx of IBS, endometriosis, constipation. EXAM: ABDOMEN - 1 VIEW COMPARISON:  Plain film of the abdomen dated 05/07/2019. CT abdomen dated 05/02/2019. FINDINGS: Persistent mildly prominent gas-filled loops of small bowel within the LEFT upper quadrant.  Overall bowel gas pattern is stable. No evidence of soft tissue mass or abnormal fluid collection. No evidence of free intraperitoneal air. Osseous structures are unremarkable. IMPRESSION: Stable plain film exam of the abdomen. Persistent mildly prominent gas-filled loops of small bowel within the LEFT upper quadrant. Bowel gas pattern appears otherwise nonobstructive. Electronically Signed   By: Franki Cabot M.D.   On: 05/08/2019 09:57   Dg Abd 1 View  Result Date: 05/07/2019 CLINICAL DATA:  Bowel obstruction. EXAM: ABDOMEN - 1 VIEW COMPARISON:  Plain films of the abdomen dated 05/06/2019 and 05/05/2019. CT abdomen dated 05/02/2019. FINDINGS: Persistent mildly distended gas-filled loops of small bowel throughout the central abdomen, similar to the appearance on earlier CT. Air is present within the  transverse colon. No evidence of soft tissue mass or abnormal fluid collection. No evidence of free intraperitoneal air. Osseous structures are unremarkable. IMPRESSION: Persistent mildly distended gas-filled loops of small bowel throughout the central abdomen, similar to the appearance on earlier CT, compatible with the partial small bowel obstruction versus ileus described on earlier CT. Electronically Signed   By: Franki Cabot M.D.   On: 05/07/2019 08:12   Dg Abd 1 View  Result Date: 05/06/2019 CLINICAL DATA:  Bowel obstruction EXAM: ABDOMEN - 1 VIEW COMPARISON:  May 05, 2019 FINDINGS: There are loops of borderline dilated small bowel in the left upper abdomen. No other bowel dilatation evident. No air-fluid levels appreciable. No free air. No abnormal calcifications. IMPRESSION: Mild dilatation of small bowel in the proximal jejunal region. No other bowel dilatation. Question a degree of enteritis or ileus. Bowel obstruction felt to be somewhat less likely. No free air. Electronically Signed   By: Lowella Grip III M.D.   On: 05/06/2019 08:54   Dg Abd 1 View  Result Date: 05/05/2019 CLINICAL DATA:   Right-sided abdominal pain, small bowel obstruction. EXAM: ABDOMEN - 1 VIEW COMPARISON:  05/04/2019 FINDINGS: Interval removal of enteric tube. There are a few air-filled minimally dilated small bowel loops without significant change. Air is present throughout the colon. No free peritoneal air. Remainder of the exam is unchanged. IMPRESSION: A few persistent air-filled minimally dilated small bowel loops with air throughout the colon. Findings may be due to ongoing ileus versus partial small bowel obstructive process. Electronically Signed   By: Marin Olp M.D.   On: 05/05/2019 09:32   Dg Abd 1 View  Result Date: 05/03/2019 CLINICAL DATA:  Nasogastric tube placement. EXAM: ABDOMEN - 1 VIEW COMPARISON:  Earlier today. FINDINGS: Interval nasogastric tube with its tip in the proximal stomach and side hole in the proximal to mid stomach. No significant change in multiple dilated small bowel loops. Poor inspiration without significant change right basilar volume loss and airspace opacity compatible with atelectasis. Minimal scoliosis. IMPRESSION: 1. Nasogastric tube tip in the proximal stomach and side hole in the proximal to mid stomach. 2. Stable small bowel ileus or partial obstruction. Electronically Signed   By: Claudie Revering M.D.   On: 05/03/2019 15:06   Mr Pelvis W Wo Contrast  Result Date: 05/08/2019 CLINICAL DATA:  Pelvic inflammatory disease, pyosalpinx. Cystic adnexal lesions. EXAM: MRI PELVIS WITHOUT AND WITH CONTRAST TECHNIQUE: Multiplanar multisequence MR imaging of the pelvis was performed both before and after administration of intravenous contrast. CONTRAST:  9 cc Gadavist COMPARISON:  12/02/2018 FINDINGS: Urinary Tract:  Unremarkable Bowel: Scattered air-fluid levels in mildly dilated loops of right abdominal small bowel. Vascular/Lymphatic: Unremarkable Reproductive: Bilateral serpentine and cystic adnexal lesions are present and ill-defined against a background of generalized edema tracking  along fascia planes in the pelvis, and scattered additional lesions with internal complex fluid and marginal thick enhancement which are difficult to separate from bowel. Given the tubular appearance of the adnexal collections I do think that complex hydrosalpinx or pyosalpinx is a differential diagnostic consideration. The dominant left-sided lesion measures 8.3 by 4.3 by 4.1 cm (volume = 77 cm^3) and has mixed high and low T2 signal and mixed high and low T1 signal. The dominant right-sided lesion measures 4.8 by 3.0 by 4.4 cm (volume = 33 cm^3)in likewise demonstrates mixed internal signal intensity and somewhat thickened enhancing margins. The uterus is mildly low in position which may be a manifestation of mild pelvic floor laxity. The  uterus measures 7.9 by 4.6 by 5.2 cm (volume = 99 cm^3). Thickened junctional zone on T2 weighted images suggests adenomyosis. The ovaries themselves are obscured by the surrounding adnexal cystic lesions and are difficult to the measure. Other: I suspect that there are also multiple rim enhancing additional lesions in the pelvis. For example, adjacent to the cecum along the right paracolic border, a 5.9 by 3.7 by 6.2 cm fluid collection with enhancing margins is present. This is adjacent to a 4.2 by 1.5 cm collection on image 11/13. Along the left paracolic gutter, a cystic lesion with thick enhancing margins measures 2 point 7 by 2.0 cm on image 53/13, and there are multiple small presumed abscesses in the perirectal space shown on images 61-93 of series 13. Additional possible small abscess in the upper abdomen measuring 3.5 by 1.4 cm on image 19/13 although this could conceivably represent a loop of bowel rather than an abscess. Above the urinary bladder eccentric to the right I suspect that there is a 6.3 by 3.1 by 3.9 cm abscess potentially containing gas, nestled below a loop of bowel. Musculoskeletal: Edema along the umbilicus. Low T2 signal intensity in the marrow,  cannot exclude hemosiderosis. IMPRESSION: 1. 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 2. Uterine adenomyosis. No definite thickening of the endometrium currently. 3. Cannot exclude hemosiderosis given the low T2 signal intensity in the marrow. 4. Scattered air-levels in mildly dilated loops of right-sided small bowel, nonspecific. Electronically Signed   By: Van Clines M.D.   On: 05/08/2019 15:15   US Pelvis Transvaginal Non-ob (tv Only)  Result Date: 05/06/2019 CLINICAL DATA:  33 year old female with a history of complex bilateral adnexal masses, suspected to represent endometriomas on prior MRI pelvis, presenting for follow-up. History of endometriosis. Uncertain LMP. EXAM: ULTRASOUND PELVIS TRANSVAGINAL TECHNIQUE: Transvaginal ultrasound examination of the pelvis was performed including evaluation of the uterus, ovaries, adnexal regions, and pelvic cul-de-sac. COMPARISON:  05/02/2019 CT abdomen/pelvis.  12/02/2018 MRI pelvis. FINDINGS: Uterus Measurements: 10.9 x 3.3 x 5.1 cm = volume: 98 mL. Retroverted and anteflexed uterus is normal in size and configuration, with no uterine fibroids or other myometrial abnormalities. Endometrium Thickness: 10 mm. No endometrial cavity fluid or focal endometrial mass. Right ovary Measurements: 10.6 x 4.6 x 4.3 cm = volume: 110 mL. There are multiple complex right ovarian cysts demonstrating variable low level internal echoes, largest 3.2 x 3.1 x 1.6 cm, compared to 3.1 x 2.9 x 1.5 cm on 12/02/2018 MRI using similar measurement technique, similar. No appreciable internal vascularity within the right ovarian cysts on Doppler. Left ovary Measurements: 8.4 x 5.6 x 4.9 cm = volume: 123 mL. There are multiple complex left  ovarian cysts demonstrating variable low level internal echoes, largest 4.5 x 3.4 x 4.0 cm, compared to 4.1 x 2.7 x 3.4 cm on 12/02/2018 MRI using similar measurement technique, similar to mildly increased. No appreciable internal vascularity within the left ovarian cysts on Doppler. Other findings:  No abnormal free fluid. IMPRESSION: 1. Multiple complex bilateral ovarian cysts with variable low level internal echoes and with no appreciable internal vascularity on color Doppler, similar to mildly increased in size since 12/02/2018 pelvic MRI study, where they were characterized as endometriomas. 2. Please note that the new 6.8 x 4.9 cm low-attenuation anterior pelvic mass visualized superior to the right  bladder on the recent 05/02/2027 unenhanced CT study cannot be evaluated on this transvaginal-only pelvic ultrasound study (transabdominal pelvic ultrasound study was not ordered). Recommend further evaluation of this pelvic mass with MRI pelvis without and with IV contrast. Alternatively, CT abdomen/pelvis performed with oral and IV contrast could be obtained initially. 3. Normal retroverted uterus. Electronically Signed   By: Ilona Sorrel M.D.   On: 05/06/2019 12:57   Dg Abd 2 Views  Result Date: 05/03/2019 CLINICAL DATA:  Bilateral lower abdominal pain and low back spasms causing shortness of breath. EXAM: ABDOMEN - 2 VIEW COMPARISON:  CT abdomen dated 05/02/2019. FINDINGS: Persistent gaseous distention of small-bowel loops within the central abdomen, stable to increased compared to yesterday's CT, with associated air-fluid levels suggesting mechanical obstruction. Suspect some new distension of the large bowel. No evidence of free intraperitoneal air. No evidence of renal or ureteral calculi. IMPRESSION: Evidence of bowel obstruction, possibly large bowel as well as small bowel involvement. These results will be called to the ordering clinician or representative by the Radiologist Assistant, and  communication documented in the PACS or zVision Dashboard. Electronically Signed   By: Franki Cabot M.D.   On: 05/03/2019 13:03   Dg Abd Portable 1v  Result Date: 05/04/2019 CLINICAL DATA:  Abdominal pain with recent bowel obstruction EXAM: PORTABLE ABDOMEN - 1 VIEW COMPARISON:  May 04, 2019 study obtained earlier in the day FINDINGS: Nasogastric tube tip and side port are in the stomach. There remain loops of mildly dilated bowel without air-fluid levels. No free air. Dilute contrast remains within loops of small bowel. No abnormal calcifications. IMPRESSION: Nasogastric tube tip and side port in stomach. Bowel appear similar to earlier in the day with mildly dilated bowel, likely due to persistent degree of bowel obstruction. No free air. Electronically Signed   By: Lowella Grip III M.D.   On: 05/04/2019 17:15   Dg Abd Portable 1v-small Bowel Obstruction Protocol-initial, 8 Hr Delay  Result Date: 05/04/2019 CLINICAL DATA:  Small bowel protocol, 8 hour delay EXAM: PORTABLE ABDOMEN - 1 VIEW COMPARISON:  05/03/2019, 05/02/2019 FINDINGS: Esophageal tube tip is in the left upper quadrant. Persistent dilatation of primarily small bowel, measuring up to 4.6 cm. Dilute contrast within dilated small bowel loops. No definitive contrast within the colon. Curvilinear metallic opacity over the central abdomen may represent external artifact IMPRESSION: Findings consistent with small bowel obstruction. Dilute contrast within dilated loops of small bowel. Electronically Signed   By: Donavan Foil M.D.   On: 05/04/2019 02:20    Labs:  CBC: Recent Labs    05/05/19 0322 05/06/19 0115 05/07/19 0445 05/09/19 0743  WBC 29.6* 32.2* 23.6* 18.5*  HGB 8.4* 7.8* 7.7* 7.4*  HCT 25.4* 23.2* 24.0* 22.6*  PLT 290 299 322 491*    COAGS: No results for input(s): INR, APTT in the last 8760 hours.  BMP: Recent Labs    05/05/19 0322 05/06/19 0115 05/07/19 0445 05/09/19 0743  NA 144 141 139 138  K 4.5 4.0  3.4* 3.2*  CL 113* 114* 113* 110  CO2 20* 21* 19* 21*  GLUCOSE 105* 164* 107* 101*  BUN 24* 20 13 5*  CALCIUM 8.6* 8.4* 8.2* 8.5*  CREATININE 1.71* 1.62* 1.29* 1.34*  GFRNONAA 39* 42* 55* 53*  GFRAA 45* 49* >60 >60    LIVER FUNCTION TESTS: Recent Labs    09/09/18 1720 05/02/19 0918  BILITOT 0.4 2.1*  AST 14* 19  ALT 10 16  ALKPHOS 64 63  PROT 7.7  8.2*  ALBUMIN 3.9 2.8*    TUMOR MARKERS: No results for input(s): AFPTM, CEA, CA199, CHROMGRNA in the last 8760 hours.  Assessment and Plan: Tubo-ovarian complex cysts with suspected abscesses IR consulted for aspiration and drainage. Case reviewed by Dr. Vernard Gambles who approves patient for procedure.  Currently stable on IV abx.  WBC elevated at 18.5. Afebrile.  Plan for drain placement tomorrow as schedule allows.  Risks and benefits discussed with the patient including bleeding, infection, damage to adjacent structures, bowel perforation/fistula connection, and sepsis.  All of the patient's questions were answered, patient is agreeable to proceed. Consent signed and in chart.  Thank you for this interesting consult.  I greatly enjoyed meeting Gwendolyn Nguyen and look forward to participating in their care.  A copy of this report was sent to the requesting provider on this date.  Electronically Signed: Docia Barrier, PA 05/09/2019, 4:04 PM   I spent a total of 40 Minutes    in face to face in clinical consultation, greater than 50% of which was counseling/coordinating care for tubo-ovarian abscesses.

## 2019-05-09 NOTE — Progress Notes (Signed)
Pt seen at 5.30 pm on 7/19 after MRI final read reviewed.  Feels a bit better today with less abdominal distention, less pain and has desire to eat, has had several loose bowel movements. No nausea today. No vomiting since 4 days.   VS reviewed and labs reviewed, no new labs.  Abdomen improved distention but is still present an is mildly tender. BS improved over yesterday. No rigidity or guarding  Extr -severe edema. No calf tenderness   MRI reviewed and d/w pt. As suspected, there are bilateral tubo-ovarian complexes with new onset abscesses at this admission.  Currently she is being treated for PID with Mefoxin/Doxy since 7/14, appropriate regimen and is improving. So recommend continuing for total 14 days  IR consult for abscess drainage is an option if clinically getting worse. Surgical intervention preferably avoided if improving due to risk of bowel injury/ fistula formation but will eventually need to be done along with gen surgery team and possibly with ureteral stents. Patient counseled on likely need to remove both tubes since likely significantly damaged and need for IVF for future fertility.    MRI report-  Reproductive: Bilateral serpentine and cystic adnexal lesions are present and ill-defined against a background of generalized edema tracking along fascia planes in the pelvis, and scattered additional lesions with internal complex fluid and marginal thick enhancement which are difficult to separate from bowel. Given the tubular appearance of the adnexal collections I do think that complex hydrosalpinx or pyosalpinx is a differential diagnostic consideration. The dominant left-sided lesion measures 8.3 by 4.3 by 4.1 cm (volume = 77 cm^3) and has mixed high and low T2 signal and mixed high and low T1 signal. The dominant right-sided lesion measures 4.8 by 3.0 by 4.4 cm (volume = 33 cm^3)in likewise demonstrates mixed internal signal intensity and somewhat thickened enhancing  margins.  The uterus is mildly low in position which may be a manifestation of mild pelvic floor laxity. The uterus measures 7.9 by 4.6 by 5.2 cm (volume = 99 cm^3). Thickened junctional zone on T2 weighted images suggests adenomyosis.  The ovaries themselves are obscured by the surrounding adnexal cystic lesions and are difficult to the measure.  Other: I suspect that there are also multiple rim enhancing additional lesions in the pelvis. For example, adjacent to the cecum along the right paracolic border, a 5.9 by 3.7 by 6.2 cm fluid collection with enhancing margins is present. This is adjacent to a 4.2 by 1.5 cm collection on image 11/13. Along the left paracolic gutter, a cystic lesion with thick enhancing margins measures 2 point 7 by 2.0 cm on image 53/13, and there are multiple small presumed abscesses in the perirectal space shown on images 61-93 of series 13. Additional possible small abscess in the upper abdomen measuring 3.5 by 1.4 cm on image 19/13 although this could conceivably represent a loop of bowel rather than an abscess.  Above the urinary bladder eccentric to the right I suspect that there is a 6.3 by 3.1 by 3.9 cm abscess potentially containing gas, nestled below a loop of bowel.  Musculoskeletal: Edema along the umbilicus. Low T2 signal intensity in the marrow, cannot exclude hemosiderosis.  IMPRESSION: 1. 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 2. Uterine adenomyosis. No definite thickening of the endometrium currently. 3. Cannot exclude hemosiderosis given the  low T2 signal intensity in the marrow. 4. Scattered air-levels in mildly dilated loops of right-sided small bowel, nonspecific.

## 2019-05-09 NOTE — Progress Notes (Addendum)
[facility]/Trauma Progress Note      Assessment/Plan Endometriosis Multiple pelvic masses ARF, secondary to dehydration  Partial SBO vs ileus - having flatusand small loose BM's, no N or V, tolerating CLD - overall improved, AXR today shows continued dilatation in proximal jejunal region - GYN following and suspect bilateral tubo-ovarian complexes with new onset abscesses at this admission. Management per GYN.  - does not appear clinically obstructed even with AXR findings so will advance diet  - will defer to medicine regarding lime green urine  FEN:FLD VTE: SCD's,heparin WI:OXBD & Mefoxin 07/14>>WBC down to 18.5, per medicine, monitor Foley:none Follow up:TBD   LOS: 7 days    Subjective: CC: lime green urine  Pt overall feels better and is not having nausea or vomiting. She is having flatus and small BM's each time she urinates. Her urine is lime green and has been this way since admission per pt. She states she feels as if she is getting a UTI. Still having spotting. Pt states weakness and SOB on exertion since admission. She has just started in the last few days ambulating in the halls.   Objective: Vital signs in last 24 hours: Temp:  [98.5 F (36.9 C)-99.2 F (37.3 C)] 98.5 F (36.9 C) (07/20 0552) Pulse Rate:  [88-92] 88 (07/20 0552) Resp:  [15-18] 18 (07/20 0552) BP: (137-142)/(86-96) 137/96 (07/20 0552) SpO2:  [100 %] 100 % (07/20 0552) Last BM Date: 05/08/19  Intake/Output from previous day: 07/19 0701 - 07/20 0700 In: 1030 [P.O.:480; IV Piggyback:550] Out: 2800 [Urine:2800] Intake/Output this shift: No intake/output data recorded.  PE: Gen: Alert, NAD, pleasant, cooperative Pulm:Rate andeffort normal Abd: Soft, ND,+ BS,no TTP. No peritonitis Skin: no rashes noted, warm and dry   Anti-infectives: Anti-infectives (From admission, onward)   Start     Dose/Rate Route Frequency Ordered Stop   05/03/19 2200  cefOXitin  (MEFOXIN) 2 g in sodium chloride 0.9 % 100 mL IVPB     2 g 200 mL/hr over 30 Minutes Intravenous Every 8 hours 05/03/19 1843     05/03/19 1200  cefOXitin (MEFOXIN) 2 g in sodium chloride 0.9 % 100 mL IVPB  Status:  Discontinued     2 g 200 mL/hr over 30 Minutes Intravenous Every 6 hours 05/03/19 1059 05/03/19 1843   05/03/19 1100  doxycycline (VIBRAMYCIN) 100 mg in sodium chloride 0.9 % 250 mL IVPB     100 mg 125 mL/hr over 120 Minutes Intravenous Every 12 hours 05/03/19 1059        Lab Results:  Recent Labs    05/07/19 0445 05/09/19 0743  WBC 23.6* 18.5*  HGB 7.7* 7.4*  HCT 24.0* 22.6*  PLT 322 491*   BMET Recent Labs    05/07/19 0445 05/09/19 0743  NA 139 138  K 3.4* 3.2*  CL 113* 110  CO2 19* 21*  GLUCOSE 107* 101*  BUN 13 5*  CREATININE 1.29* 1.34*  CALCIUM 8.2* 8.5*   PT/INR No results for input(s): LABPROT, INR in the last 72 hours. CMP     Component Value Date/Time   NA 138 05/09/2019 0743   K 3.2 (L) 05/09/2019 0743   CL 110 05/09/2019 0743   CO2 21 (L) 05/09/2019 0743   GLUCOSE 101 (H) 05/09/2019 0743   BUN 5 (L) 05/09/2019 0743   CREATININE 1.34 (H) 05/09/2019 0743   CALCIUM 8.5 (L) 05/09/2019 0743   PROT 8.2 (H) 05/02/2019 0918   ALBUMIN 2.8 (L) 05/02/2019 0918   AST 19  05/02/2019 0918   ALT 16 05/02/2019 0918   ALKPHOS 63 05/02/2019 0918   BILITOT 2.1 (H) 05/02/2019 0918   GFRNONAA 53 (L) 05/09/2019 0743   GFRAA >60 05/09/2019 0743   Lipase     Component Value Date/Time   LIPASE 57 (H) 05/02/2019 0918    Studies/Results: Dg Abd 1 View  Result Date: 05/09/2019 CLINICAL DATA:  Right lower quadrant abdominal pain. Bowel obstruction. EXAM: ABDOMEN - 1 VIEW COMPARISON:  05/08/2019; 05/07/2019; CT abdomen pelvis-05/02/2019 FINDINGS: Grossly unchanged gaseous distension of several rather featureless loops of presumed small bowel within the right upper abdominal quadrant with index loop measuring approximately 5.3 cm in diameter. There is  persistent patulous distension of several loops of small bowel within the left mid hemiabdomen with index loop measuring approximately 3.6 cm in diameter. These findings are again associated with a conspicuous paucity of distal colonic gas. No pneumoperitoneum, pneumatosis or portal venous gas. Punctate phleboliths overlie the left hemipelvis. Otherwise, no definitive abnormal intra-abdominal calcifications. No acute osseous abnormalities. IMPRESSION: Similar findings again worrisome for small bowel obstruction. Clinical correlation is advised. Electronically Signed   By: Sandi Mariscal M.D.   On: 05/09/2019 07:57   Dg Abd 1 View  Result Date: 05/08/2019 CLINICAL DATA:  Bowel obstruction; abdomen pain, vomiting since 05/02/19. Ovarian cysts. Hx of IBS, endometriosis, constipation. EXAM: ABDOMEN - 1 VIEW COMPARISON:  Plain film of the abdomen dated 05/07/2019. CT abdomen dated 05/02/2019. FINDINGS: Persistent mildly prominent gas-filled loops of small bowel within the LEFT upper quadrant. Overall bowel gas pattern is stable. No evidence of soft tissue mass or abnormal fluid collection. No evidence of free intraperitoneal air. Osseous structures are unremarkable. IMPRESSION: Stable plain film exam of the abdomen. Persistent mildly prominent gas-filled loops of small bowel within the LEFT upper quadrant. Bowel gas pattern appears otherwise nonobstructive. Electronically Signed   By: Franki Cabot M.D.   On: 05/08/2019 09:57   Mr Pelvis W Wo Contrast  Result Date: 05/08/2019 CLINICAL DATA:  Pelvic inflammatory disease, pyosalpinx. Cystic adnexal lesions. EXAM: MRI PELVIS WITHOUT AND WITH CONTRAST TECHNIQUE: Multiplanar multisequence MR imaging of the pelvis was performed both before and after administration of intravenous contrast. CONTRAST:  9 cc Gadavist COMPARISON:  12/02/2018 FINDINGS: Urinary Tract:  Unremarkable Bowel: Scattered air-fluid levels in mildly dilated loops of right abdominal small bowel.  Vascular/Lymphatic: Unremarkable Reproductive: Bilateral serpentine and cystic adnexal lesions are present and ill-defined against a background of generalized edema tracking along fascia planes in the pelvis, and scattered additional lesions with internal complex fluid and marginal thick enhancement which are difficult to separate from bowel. Given the tubular appearance of the adnexal collections I do think that complex hydrosalpinx or pyosalpinx is a differential diagnostic consideration. The dominant left-sided lesion measures 8.3 by 4.3 by 4.1 cm (volume = 77 cm^3) and has mixed high and low T2 signal and mixed high and low T1 signal. The dominant right-sided lesion measures 4.8 by 3.0 by 4.4 cm (volume = 33 cm^3)in likewise demonstrates mixed internal signal intensity and somewhat thickened enhancing margins. The uterus is mildly low in position which may be a manifestation of mild pelvic floor laxity. The uterus measures 7.9 by 4.6 by 5.2 cm (volume = 99 cm^3). Thickened junctional zone on T2 weighted images suggests adenomyosis. The ovaries themselves are obscured by the surrounding adnexal cystic lesions and are difficult to the measure. Other: I suspect that there are also multiple rim enhancing additional lesions in the pelvis. For example, adjacent to the  cecum along the right paracolic border, a 5.9 by 3.7 by 6.2 cm fluid collection with enhancing margins is present. This is adjacent to a 4.2 by 1.5 cm collection on image 11/13. Along the left paracolic gutter, a cystic lesion with thick enhancing margins measures 2 point 7 by 2.0 cm on image 53/13, and there are multiple small presumed abscesses in the perirectal space shown on images 61-93 of series 13. Additional possible small abscess in the upper abdomen measuring 3.5 by 1.4 cm on image 19/13 although this could conceivably represent a loop of bowel rather than an abscess. Above the urinary bladder eccentric to the right I suspect that there is a  6.3 by 3.1 by 3.9 cm abscess potentially containing gas, nestled below a loop of bowel. Musculoskeletal: Edema along the umbilicus. Low T2 signal intensity in the marrow, cannot exclude hemosiderosis. IMPRESSION: 1. 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 2. Uterine adenomyosis. No definite thickening of the endometrium currently. 3. Cannot exclude hemosiderosis given the low T2 signal intensity in the marrow. 4. Scattered air-levels in mildly dilated loops of right-sided small bowel, nonspecific. Electronically Signed   By: Van Clines M.D.   On: 05/08/2019 15:15      Kalman Drape , [facility] 05/09/2019, 8:37 AM  Pager: [phone] Mon-Wed, Friday 7:00am-4:30pm Thurs 7am-11:30am  Consults: [phone]

## 2019-05-10 ENCOUNTER — Inpatient Hospital Stay (HOSPITAL_COMMUNITY): Payer: BC Managed Care – PPO

## 2019-05-10 LAB — CBC
HCT: 20 % — ABNORMAL LOW (ref 36.0–46.0)
Hemoglobin: 6.5 g/dL — CL (ref 12.0–15.0)
MCH: 24.7 pg — ABNORMAL LOW (ref 26.0–34.0)
MCHC: 32.5 g/dL (ref 30.0–36.0)
MCV: 76 fL — ABNORMAL LOW (ref 80.0–100.0)
Platelets: 496 10*3/uL — ABNORMAL HIGH (ref 150–400)
RBC: 2.63 MIL/uL — ABNORMAL LOW (ref 3.87–5.11)
RDW: 18 % — ABNORMAL HIGH (ref 11.5–15.5)
WBC: 12.9 10*3/uL — ABNORMAL HIGH (ref 4.0–10.5)
nRBC: 0.2 % (ref 0.0–0.2)

## 2019-05-10 LAB — HEMOGLOBIN AND HEMATOCRIT, BLOOD
HCT: 22.2 % — ABNORMAL LOW (ref 36.0–46.0)
Hemoglobin: 7.4 g/dL — ABNORMAL LOW (ref 12.0–15.0)

## 2019-05-10 LAB — BASIC METABOLIC PANEL
Anion gap: 6 (ref 5–15)
BUN: <5 mg/dL — ABNORMAL LOW (ref 6–20)
CO2: 23 mmol/L (ref 22–32)
Calcium: 8.5 mg/dL — ABNORMAL LOW (ref 8.9–10.3)
Chloride: 111 mmol/L (ref 98–111)
Creatinine, Ser: 1.33 mg/dL — ABNORMAL HIGH (ref 0.44–1.00)
GFR calc Af Amer: >60 mL/min (ref >60)
GFR calc non Af Amer: 53 mL/min — ABNORMAL LOW (ref >60)
Glucose, Bld: 112 mg/dL — ABNORMAL HIGH (ref 70–99)
Potassium: 3.8 mmol/L (ref 3.5–5.1)
Sodium: 140 mmol/L (ref 135–145)

## 2019-05-10 LAB — PREPARE RBC (CROSSMATCH)

## 2019-05-10 MED ORDER — SODIUM CHLORIDE 0.9% FLUSH
5.0000 mL | Freq: Three times a day (TID) | INTRAVENOUS | Status: DC
  Administered 2019-05-10 – 2019-05-14 (×10): 5 mL

## 2019-05-10 MED ORDER — FENTANYL CITRATE (PF) 100 MCG/2ML IJ SOLN
INTRAMUSCULAR | Status: AC | PRN
  Administered 2019-05-10: 25 ug via INTRAVENOUS
  Administered 2019-05-10: 50 ug via INTRAVENOUS
  Administered 2019-05-10: 25 ug via INTRAVENOUS

## 2019-05-10 MED ORDER — FENTANYL CITRATE (PF) 100 MCG/2ML IJ SOLN
INTRAMUSCULAR | Status: AC
  Filled 2019-05-10: qty 2

## 2019-05-10 MED ORDER — SODIUM CHLORIDE 0.9% IV SOLUTION
Freq: Once | INTRAVENOUS | Status: AC
  Administered 2019-05-10: 10:00:00 via INTRAVENOUS

## 2019-05-10 MED ORDER — MIDAZOLAM HCL 2 MG/2ML IJ SOLN
INTRAMUSCULAR | Status: AC | PRN
  Administered 2019-05-10 (×2): 0.5 mg via INTRAVENOUS
  Administered 2019-05-10: 1 mg via INTRAVENOUS

## 2019-05-10 MED ORDER — MIDAZOLAM HCL 2 MG/2ML IJ SOLN
INTRAMUSCULAR | Status: AC
  Filled 2019-05-10: qty 2

## 2019-05-10 NOTE — Sedation Documentation (Signed)
Pt is awake and alert. She states that she is somewhat sore in the area were the drain was placed. Purulent to red fluid coming draining into 50f jp. Report called to [facility] RN. She verbalized understanding.

## 2019-05-10 NOTE — Progress Notes (Signed)
CRITICAL VALUE ALERT  Critical Value:  Hemoglobin: 6.5  Date & Time Notied:  05/10/19 0757  Provider Notified: Yes, Dr. Court Joy  Orders Received/Actions taken: pending

## 2019-05-10 NOTE — Progress Notes (Signed)
Subjective: Pt seen at the bedside on rounds today. Appears comfortable. Says that she feels better today and that she was able to walk around the unit. R Foot is painful from edema, but able to ambulate well. She uses the bathroom multiple times in an hour. Urine is bright green color. Bowel movements are not as loose, and notices it drops to the bottom of the toilet instead of floating. Brown in color. Asks what time the procedure is because she is hungry and looking forward to eating.   Objective:  Vital signs in last 24 hours: Vitals:   05/09/19 0552 05/09/19 1531 05/09/19 2025 05/10/19 0431  BP: (!) 137/96 136/87 (!) 145/92 (!) 141/87  Pulse: 88 96 83 82  Resp: 18 18 17 18   Temp: 98.5 F (36.9 C) 98.7 F (37.1 C) 98.4 F (36.9 C) 99.2 F (37.3 C)  TempSrc: Oral Oral Oral Oral  SpO2: 100% 100% 98% 100%  Weight:      Height:       Physical Exam Constitutional:      General: She is not in acute distress. Pulmonary:     Effort: Pulmonary effort is normal.     Breath sounds: No wheezing, rhonchi or rales.  Abdominal:     General: There is no distension.     Tenderness: There is no abdominal tenderness. There is no guarding.     Comments: Hypoactive bowel sounds  Musculoskeletal:     Right lower leg: No edema.     Left lower leg: No edema.  Skin:    General: Skin is warm and dry.  Neurological:     Mental Status: She is alert.     Assessment/Plan:  Active Problems:   Pelvic mass   Small bowel obstruction ([place])  A 33 year old female who presented with worsening of abdominal pain and constipation.  #SBOvs Ileus: She is continues to work on ambulation.  Patient has not asked for any IV Dilaudid since 7/19.  X-ray this morning shows resolution of small bowel obstruction/ileus.  Bowel sounds hypoactive and abdomen is not distended.  Continues to have small bowel movements, which have changed to more dense feces. Denies nausea and vomiting. Increased appetitive.  -  Appreciategeneralsurgery recommendations -Ambulate - NPO for procedure, will resume clear liquid diet after  #Pelvic Mass,Leukocytosis Patient has a history of pelvic inflammatory disease.  Gonorrhea chlamydia test negative on this stay.  Patient with history of endometriosis treated with lowOCP.Ob/Gyn has consulted and following.Patient currently on Doxy and cefoxitinfor suspected PID.WBC continues to downtrend to 12.9 today.  Afebrile.  IR to drain abscesses today. .  -Appreciate OB/Gyn recommendations -Doxy and cefoxitin(Day 8/14) for PID -Dilaudid1mg , q2h PRN, Severe Pain -Toradol15mg  IV, q8h PRN, Mod Pain - Zofran PRN - AM CBC  #AKI:Creatinine 2.79>>>1.34. Baseline~1.01.Has improved with IVF. Patient appears volume hypervolemic on exam. Will hold IV fluids at this time. Patient should be able to resume PO intake after procedure with resolution of bowel obstruction on morning xray. - AM BMP  #Edema:Patient to have bilateral LE edema to her Knees this AM. Suspect multifactorial given continuous IVF, Increased PO intake, increased ambulation, and possibly mass effect on IVC.  Net -1.7 L overnight..  +8.8 L on admission.  Will stop fluids and allow patient to resorb 3rd space fluids for now. - Stop IVF - Continue ambulation - Rest on Left side when in bed  #Microcytic Anemia:Hgb 8.4, MCV 73, TIBC low 238. Iron 41.Ferritin 393.Elevated retics.Suspect chronic Iron deficiencyand acute inflammatory  state.Hemoglobin in Nov 2019~8.1.S/P Feraheme7/17.    Patientremains asymptomatic, Hgb 7.4 to 6.5 overnight  , likely dilutional due to IV fluids and serial phlebotomy. Denies melena , hematochezia, hematemesis, and hematuria. % Sat well on room air. Transfused for critical Hgb. O neg.   - AM CBC - 1 unit RBC today   Diet:NPO for procedure VTE: Heparin Code: Full  #Dispo: Anticipated discharge in approximately1-2day(s).   Tamsen Snider, MD PGY1   [phone]

## 2019-05-10 NOTE — Discharge Summary (Signed)
Name: Tanishia Lemaster MRN: [id number] DOB: [birth year] 33 y.o. PCP: Patient, No Pcp Per  Date of Admission: 05/02/2019  9:03 AM Date of Discharge: 05/14/2019 Attending Physician: Lalla Brothers T  Discharge Diagnosis: 1. Pelvic abscess 2.Small Bowel Obstruction 3. Microcytic Anemia  Discharge Medications: Allergies as of 05/14/2019      Reactions   Gadolinium Derivatives Hives, Itching, Nausea And Vomiting   Pt vomited immediately during injection.  15 minutes later, pt developed hives and itching.    Shellfish Allergy Hives   Sulfa Antibiotics Other (See Comments)   Patient was told to NOT TAKE THIS      Medication List    TAKE these medications   Black Currant Seed Oil 500 MG Caps Take 1 capsule by mouth daily.   doxycycline 100 MG tablet Commonly known as: VIBRA-TABS Take 1 tablet (100 mg total) by mouth every 12 (twelve) hours for 5 doses. Notes to patient: Tonight at 9pm   ferrous sulfate 325 (65 FE) MG tablet Take 325 mg by mouth daily with breakfast. Notes to patient: tomorrow   HYDROcodone-acetaminophen 10-325 MG tablet Commonly known as: NORCO Take 1 tablet by mouth every 6 (six) hours as needed for up to 5 days for moderate pain or severe pain.   IBGARD PO Take 1 capsule by mouth daily.   metroNIDAZOLE 500 MG tablet Commonly known as: FLAGYL Take 1 tablet (500 mg total) by mouth 2 (two) times daily. Notes to patient: Tonight at 9pm   ondansetron 4 MG tablet Commonly known as: ZOFRAN Take 1 tablet (4 mg total) by mouth every 8 (eight) hours as needed for nausea.   senna-docusate 8.6-50 MG tablet Commonly known as: Senokot-S Take 2 tablets by mouth 2 (two) times daily. Notes to patient: Tonight    sodium chloride flush 0.9 % Soln Commonly known as: NS 10 ml into drain daily Notes to patient: tomorrow   Urelle 81 MG Tabs tablet Take 1 tablet (81 mg total) by mouth 4 (four) times daily.   vitamin E 1000 UNIT capsule Take 1,000 Units by mouth  daily.       Disposition and follow-up:   Ms.Aiana Helvey was discharged from [facility] in Stable condition.  At the hospital follow up visit please address:  1.   Pelvic Abscesses: Tuboovarian and paracolic pelvic abscesses. Patient sent home with 3 days PO antibiotics to finish 14 day course.  JP drain in place. Follow-up  with [place] [facility] for drain removal.  - Assess for fever, changes in abdominal pain - Follow up CBC  SBO: Small bowel obstruction 2/2 to pelvic abscesses resolved in hospital.   - Assess if patient is having regular bowel movement.  Microcytic Anemia : Chronic Iron Def Anemia, appears baseline around 8 or 9. Asymptomatic, required 1 unit after multiple lab draws. Given 2  IV doses of Ferriheme. Ob/Gyn will start Ferrous Sulfate on follow up appointment.  - CBC as above  2.  Labs / imaging needed at time of follow-up: CBC  3.  Pending labs/ test needing follow-up:   Follow-up Appointments: Follow-up Information    [facility], [facility] Follow up.   Contact information: [place] [address] [phone]        [facility] Follow up.   Specialty: Rehabilitation Why: You will be contacted regarding physical therapy appointments.  Contact information: [address] [id number] [place] [phone]  Hospital Course by problem list: 1. Pelvic Mass     SBO     AKI  CTshowed stable Ladnexal mass and Radnexal mass with interval development measures 6.8 x 4.9 cm.Patient experiencing nausea and vomiting for 2 weeksPTAand wasfound to have possible SBO. Patient had an NG tube placed but pulled it out after a day . We continued to follow small bowel obstruction with serial x-rays , which has resolved Patient followed for AKI, creatinine downtrended with IV fluids.  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  Unable to evaluate further with transvaginal ultrasound on 7/17.  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  Also scattered collection of thick enhancing margins along the peri-colic gutters perirectal space and urinary bladder suspicious for abscesses. IR placed JP drain on 7/21.Patient on IV Doxy and Cefoxitin for 10 days and transitioned to PO metronidazole and Doxy to finish up 14 day course. Patient going home with JP drain and follow up in IR clinic. No organism RLQ abscess at 5 days.   Microcytic Anemia Hgb 8.4, MCV 73, TIBC low 238. Iron 41.Ferritin 393.Elevated retics.Suspect chronic Iron deficiencyand acute inflammatory state.Hemoglobin in Nov 2019~8.1. Patientremains asymptomatic, but dropped below 7 requiring 1 unit RBC on 7/21 with appropriate response. S/P Feraheme7/17 and 2nd dose 7/24.  Discharge Vitals:   BP 132/85 (BP Location: Right Arm)   Pulse 82   Temp 98.4 F (36.9 C) (Oral)   Resp 18   Ht 5\' 4"  (1.626 m)   Wt 87.5 kg   SpO2 100%   BMI 33.13 kg/m   Pertinent Labs, Studies, and Procedures:  CBC Latest Ref Rng & Units 05/14/2019 05/13/2019 05/12/2019  WBC 4.0 - 10.5 K/uL 9.7 10.8(H) 12.2(H)  Hemoglobin 12.0 - 15.0 g/dL 8.8(L) 8.2(L) 8.1(L)  Hematocrit 36.0 - 46.0 % 27.5(L) 25.7(L) 25.0(L)  Platelets 150 - 400 K/uL 648(H) 663(H) 631(H)   BMP Latest Ref Rng & Units 05/12/2019 05/11/2019 05/10/2019  Glucose 70 - 99 mg/dL 84 84 112(H)  BUN 6 - 20 mg/dL <5(L) <5(L) <5(L)  Creatinine 0.44 - 1.00 mg/dL 1.11(H) 1.06(H) 1.33(H)  Sodium 135 - 145 mmol/L 140 139 140  Potassium 3.5 - 5.1 mmol/L 4.6 4.3 3.8  Chloride 98 - 111 mmol/L 107 109 111  CO2 22 - 32 mmol/L 25 24 23   Calcium 8.9 - 10.3 mg/dL 8.9 8.4(L) 8.5(L)   MRI Pelvis  FINDINGS: Urinary Tract:  Unremarkable  Bowel: Scattered air-fluid levels in mildly  dilated loops of right abdominal small bowel.  Vascular/Lymphatic: Unremarkable  Reproductive: Bilateral serpentine and cystic adnexal lesions are present and ill-defined against a background of generalized edema tracking along fascia planes in the pelvis, and scattered additional lesions with internal complex fluid and marginal thick enhancement which are difficult to separate from bowel. Given the tubular appearance of the adnexal collections I do think that complex hydrosalpinx or pyosalpinx is a differential diagnostic consideration. The dominant left-sided lesion measures 8.3 by 4.3 by 4.1 cm (volume = 77 cm^3) and has mixed high and low T2 signal and mixed high and low T1 signal. The dominant right-sided lesion measures 4.8 by 3.0 by 4.4 cm (volume = 33 cm^3)in likewise demonstrates mixed internal signal intensity and somewhat thickened enhancing margins.  The uterus is mildly low in position which may be a manifestation of mild pelvic floor laxity. The uterus measures 7.9 by 4.6 by 5.2 cm (volume =  99 cm^3). Thickened junctional zone on T2 weighted images suggests adenomyosis.  The ovaries themselves are obscured by the surrounding adnexal cystic lesions and are difficult to the measure.  Other: I suspect that there are also multiple rim enhancing additional lesions in the pelvis. For example, adjacent to the cecum along the right paracolic border, a 5.9 by 3.7 by 6.2 cm fluid collection with enhancing margins is present. This is adjacent to a 4.2 by 1.5 cm collection on image 11/13. Along the left paracolic gutter, a cystic lesion with thick enhancing margins measures 2 point 7 by 2.0 cm on image 53/13, and there are multiple small presumed abscesses in the perirectal space shown on images 61-93 of series 13. Additional possible small abscess in the upper abdomen measuring 3.5 by 1.4 cm on image 19/13 although this could conceivably represent a loop of bowel rather  than an abscess.  Above the urinary bladder eccentric to the right I suspect that there is a 6.3 by 3.1 by 3.9 cm abscess potentially containing gas, nestled below a loop of bowel.  Musculoskeletal: Edema along the umbilicus. Low T2 signal intensity in the marrow, cannot exclude hemosiderosis.  IMPRESSION: 1. 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 2. Uterine adenomyosis. No definite thickening of the endometrium currently. 3. Cannot exclude hemosiderosis given the low T2 signal intensity in the marrow. 4. Scattered air-levels in mildly dilated loops of right-sided small bowel, nonspecific.    Discharge Instructions: Discharge Instructions    Ambulatory referral to Physical Therapy   Complete by: As directed    Call MD for:  persistant nausea and vomiting   Complete by: As directed    Call MD for:  redness, tenderness, or signs of infection (pain, swelling, redness, odor or green/yellow discharge around incision site)   Complete by: As directed    Call MD for:  severe uncontrolled pain   Complete by: As directed    Call MD for:  temperature >100.4   Complete by: As directed    Diet - low sodium heart healthy   Complete by: As directed    Discharge instructions   Complete by: As directed    Ms.Lui,  It was my pleasure to take part in your care. We treated your dehydration which improved your kidney function. In addition your small bowel was obstructed and we now has resolved. Lastly we treated abscess in your pelvic area with IV antibiotics and a drain was placed to remove infection. Please finish the 3 days of antibiotics you are being prescribed.  You will go home with your drain. The nurse will give you instructions on  how to flush the drain and keep a daily log of amount drained.  We have schedule home health physical therapy for you.   Scheduling will contact you to set up appointment with interventional radiology drain clinic.  You should call your OB/Gyn and schedule to be seen in 7-10 days.  My best,  Dr.Lacharles Altschuler   Increase activity slowly   Complete by: As directed       Signed: Tamsen Snider, MD PGY1  [phone]

## 2019-05-10 NOTE — Progress Notes (Addendum)
GYN:  IV cefoxitin/doxycycline Day 9 S/p feraheme  S: Feels weak but improved. Currently being transfused.  (+) heart rate increase with ambulation. Noted abdominal pain better Tolerating liquids Interested in drainage of abscess scheduled for today  O; BP (!) 144/91   Pulse 73   Temp 98.6 F (37 C) (Oral)   Resp 16   Ht 5\' 4"  (1.626 m)   Wt 87.5 kg   SpO2 100%   BMI 33.13 kg/m   WDWN AAF No distress Lungs clear to A Cor RRR Abdomen: soft active BS tender Pelvic deferred Extr: +1 edema Neuro : nonfocal Skin: intact  CBC Latest Ref Rng & Units 05/10/2019 05/09/2019 05/07/2019  WBC 4.0 - 10.5 K/uL 12.9(H) 18.5(H) 23.6(H)  Hemoglobin 12.0 - 15.0 g/dL 6.5(LL) 7.4(L) 7.7(L)  Hematocrit 36.0 - 46.0 % 20.0(L) 22.6(L) 24.0(L)  Platelets 150 - 400 K/uL 496(H) 491(H) 322  Ct Abdomen Pelvis Wo Contrast  Result Date: 05/02/2019 CLINICAL DATA:  Right-sided back pain. EXAM: CT ABDOMEN AND PELVIS WITHOUT CONTRAST TECHNIQUE: Multidetector CT imaging of the abdomen and pelvis was performed following the standard protocol without IV contrast. COMPARISON:  Abdominal CT September 09, 2018, pelvic MRI December 02, 2018 FINDINGS: Lower chest: Right middle and lower lobe atelectasis versus scarring. 4 mm ground-glass nodule in the right upper lobe, image 2/26, sequence 7. Hepatobiliary: No focal liver abnormality is seen. No gallstones, gallbladder wall thickening, or biliary dilatation. Pancreas: Unremarkable. No pancreatic ductal dilatation or surrounding inflammatory changes. Spleen: Normal in size without focal abnormality. Adrenals/Urinary Tract: Adrenal glands are unremarkable. Kidneys are normal, without renal calculi, focal lesion, or hydronephrosis. Bladder is unremarkable. Stomach/Bowel: Numerous prominent, but sub pathologic by CT criteria small bowel loops with gas fluid levels. The colon is decompressed. Vascular/Lymphatic: No significant vascular findings are present. No enlarged abdominal or  pelvic lymph nodes. Shotty retroperitoneal lymph nodes, sub pathologic by CT criteria and nonspecific. Reproductive: There is a complex cystic lesion associated with the left adnexa, which measures 5.6 cm in greatest dimension, relatively stable. There is a complex cystic lesion associated with the right adnexa which measures approximately 4 poor in 9 cm, also relatively stable from the prior CT. However, there has been an interval development of a new intermediate density mass in the right pelvis which measures 6.8 x 4.9 cm, image 79/98, sequence 3. A connection to the right adnexa is suspected. The uterus is normal. Other: No abdominal wall hernia or abnormality. No abdominopelvic ascites. Musculoskeletal: Ill-defined sclerotic lesion within the body of the sternum measures 9 mm, image 7/98, sequence 3. 2 smaller sclerotic foci are present within the left ilium, measuring 4 and 5 mm, image 66/98, sequence 3. IMPRESSION: 1. Numerous mildly distended, technically sub pathologic by CT criteria, small bowel loops with gas fluid levels. The colon is decompressed. The findings are nonspecific but may represent early/low-grade small bowel obstruction versus ileus. 2. Known bilateral complex cystic adnexal masses, with interval development of a third 6.8 cm intermediate density mass in the right pelvis, which likely arises in the right adnexa. Further evaluation with pelvic ultrasound or MRI without and with contrast, and OBGYN consultation may be considered. 3. Ill-defined sclerotic foci within the body of the sternum and left ilium, which may represent bone islands, however osseous metastatic disease cannot be excluded. 4. Right middle and lower lobe atelectasis versus scarring. 4 mm ground-glass nodule in the right upper lobe. No follow-up needed if patient is low-risk. Non-contrast chest CT can be considered in 12 months if  patient is high-risk. This recommendation follows the consensus statement: Guidelines for  Management of Incidental Pulmonary Nodules Detected on CT Images: From the Fleischner Society 2017; Radiology 2017; 284:228-243. These results were called by telephone at the time of interpretation on 05/02/2019 at 11:14 am to Dr. Virgel Manifold , who verbally acknowledged these results. Electronically Signed   By: Fidela Salisbury M.D.   On: 05/02/2019 11:17   Dg Abd 1 View  Result Date: 05/10/2019 CLINICAL DATA:  Abdominal pain and distention. EXAM: ABDOMEN - 1 VIEW COMPARISON:  Radiograph May 09, 2019. FINDINGS: Dilated small bowel loop seen in left upper quadrant on prior exam are no longer visualized. Improved probable dilated colonic loop is noted in right lower quadrant. Stool appears to be in the right colon and rectum. No radio-opaque calculi or other significant radiographic abnormality are seen. IMPRESSION: No definite evidence of small bowel obstruction or ileus is noted currently. Electronically Signed   By: Marijo Conception M.D.   On: 05/10/2019 10:13   Dg Abd 1 View  Result Date: 05/09/2019 CLINICAL DATA:  Right lower quadrant abdominal pain. Bowel obstruction. EXAM: ABDOMEN - 1 VIEW COMPARISON:  05/08/2019; 05/07/2019; CT abdomen pelvis-05/02/2019 FINDINGS: Grossly unchanged gaseous distension of several rather featureless loops of presumed small bowel within the right upper abdominal quadrant with index loop measuring approximately 5.3 cm in diameter. There is persistent patulous distension of several loops of small bowel within the left mid hemiabdomen with index loop measuring approximately 3.6 cm in diameter. These findings are again associated with a conspicuous paucity of distal colonic gas. No pneumoperitoneum, pneumatosis or portal venous gas. Punctate phleboliths overlie the left hemipelvis. Otherwise, no definitive abnormal intra-abdominal calcifications. No acute osseous abnormalities. IMPRESSION: Similar findings again worrisome for small bowel obstruction. Clinical correlation  is advised. Electronically Signed   By: Sandi Mariscal M.D.   On: 05/09/2019 07:57   Dg Abd 1 View  Result Date: 05/08/2019 CLINICAL DATA:  Bowel obstruction; abdomen pain, vomiting since 05/02/19. Ovarian cysts. Hx of IBS, endometriosis, constipation. EXAM: ABDOMEN - 1 VIEW COMPARISON:  Plain film of the abdomen dated 05/07/2019. CT abdomen dated 05/02/2019. FINDINGS: Persistent mildly prominent gas-filled loops of small bowel within the LEFT upper quadrant. Overall bowel gas pattern is stable. No evidence of soft tissue mass or abnormal fluid collection. No evidence of free intraperitoneal air. Osseous structures are unremarkable. IMPRESSION: Stable plain film exam of the abdomen. Persistent mildly prominent gas-filled loops of small bowel within the LEFT upper quadrant. Bowel gas pattern appears otherwise nonobstructive. Electronically Signed   By: Franki Cabot M.D.   On: 05/08/2019 09:57   Dg Abd 1 View  Result Date: 05/07/2019 CLINICAL DATA:  Bowel obstruction. EXAM: ABDOMEN - 1 VIEW COMPARISON:  Plain films of the abdomen dated 05/06/2019 and 05/05/2019. CT abdomen dated 05/02/2019. FINDINGS: Persistent mildly distended gas-filled loops of small bowel throughout the central abdomen, similar to the appearance on earlier CT. Air is present within the transverse colon. No evidence of soft tissue mass or abnormal fluid collection. No evidence of free intraperitoneal air. Osseous structures are unremarkable. IMPRESSION: Persistent mildly distended gas-filled loops of small bowel throughout the central abdomen, similar to the appearance on earlier CT, compatible with the partial small bowel obstruction versus ileus described on earlier CT. Electronically Signed   By: Franki Cabot M.D.   On: 05/07/2019 08:12   Dg Abd 1 View  Result Date: 05/06/2019 CLINICAL DATA:  Bowel obstruction EXAM: ABDOMEN - 1 VIEW COMPARISON:  May 05, 2019 FINDINGS: There are loops of borderline dilated small bowel in the left  upper abdomen. No other bowel dilatation evident. No air-fluid levels appreciable. No free air. No abnormal calcifications. IMPRESSION: Mild dilatation of small bowel in the proximal jejunal region. No other bowel dilatation. Question a degree of enteritis or ileus. Bowel obstruction felt to be somewhat less likely. No free air. Electronically Signed   By: Lowella Grip III M.D.   On: 05/06/2019 08:54   Dg Abd 1 View  Result Date: 05/05/2019 CLINICAL DATA:  Right-sided abdominal pain, small bowel obstruction. EXAM: ABDOMEN - 1 VIEW COMPARISON:  05/04/2019 FINDINGS: Interval removal of enteric tube. There are a few air-filled minimally dilated small bowel loops without significant change. Air is present throughout the colon. No free peritoneal air. Remainder of the exam is unchanged. IMPRESSION: A few persistent air-filled minimally dilated small bowel loops with air throughout the colon. Findings may be due to ongoing ileus versus partial small bowel obstructive process. Electronically Signed   By: Marin Olp M.D.   On: 05/05/2019 09:32   Dg Abd 1 View  Result Date: 05/03/2019 CLINICAL DATA:  Nasogastric tube placement. EXAM: ABDOMEN - 1 VIEW COMPARISON:  Earlier today. FINDINGS: Interval nasogastric tube with its tip in the proximal stomach and side hole in the proximal to mid stomach. No significant change in multiple dilated small bowel loops. Poor inspiration without significant change right basilar volume loss and airspace opacity compatible with atelectasis. Minimal scoliosis. IMPRESSION: 1. Nasogastric tube tip in the proximal stomach and side hole in the proximal to mid stomach. 2. Stable small bowel ileus or partial obstruction. Electronically Signed   By: Claudie Revering M.D.   On: 05/03/2019 15:06   Mr Pelvis W Wo Contrast  Result Date: 05/08/2019 CLINICAL DATA:  Pelvic inflammatory disease, pyosalpinx. Cystic adnexal lesions. EXAM: MRI PELVIS WITHOUT AND WITH CONTRAST TECHNIQUE:  Multiplanar multisequence MR imaging of the pelvis was performed both before and after administration of intravenous contrast. CONTRAST:  9 cc Gadavist COMPARISON:  12/02/2018 FINDINGS: Urinary Tract:  Unremarkable Bowel: Scattered air-fluid levels in mildly dilated loops of right abdominal small bowel. Vascular/Lymphatic: Unremarkable Reproductive: Bilateral serpentine and cystic adnexal lesions are present and ill-defined against a background of generalized edema tracking along fascia planes in the pelvis, and scattered additional lesions with internal complex fluid and marginal thick enhancement which are difficult to separate from bowel. Given the tubular appearance of the adnexal collections I do think that complex hydrosalpinx or pyosalpinx is a differential diagnostic consideration. The dominant left-sided lesion measures 8.3 by 4.3 by 4.1 cm (volume = 77 cm^3) and has mixed high and low T2 signal and mixed high and low T1 signal. The dominant right-sided lesion measures 4.8 by 3.0 by 4.4 cm (volume = 33 cm^3)in likewise demonstrates mixed internal signal intensity and somewhat thickened enhancing margins. The uterus is mildly low in position which may be a manifestation of mild pelvic floor laxity. The uterus measures 7.9 by 4.6 by 5.2 cm (volume = 99 cm^3). Thickened junctional zone on T2 weighted images suggests adenomyosis. The ovaries themselves are obscured by the surrounding adnexal cystic lesions and are difficult to the measure. Other: I suspect that there are also multiple rim enhancing additional lesions in the pelvis. For example, adjacent to the cecum along the right paracolic border, a 5.9 by 3.7 by 6.2 cm fluid collection with enhancing margins is present. This is adjacent to a 4.2 by 1.5 cm collection on image 11/13.  Along the left paracolic gutter, a cystic lesion with thick enhancing margins measures 2 point 7 by 2.0 cm on image 53/13, and there are multiple small presumed abscesses in the  perirectal space shown on images 61-93 of series 13. Additional possible small abscess in the upper abdomen measuring 3.5 by 1.4 cm on image 19/13 although this could conceivably represent a loop of bowel rather than an abscess. Above the urinary bladder eccentric to the right I suspect that there is a 6.3 by 3.1 by 3.9 cm abscess potentially containing gas, nestled below a loop of bowel. Musculoskeletal: Edema along the umbilicus. Low T2 signal intensity in the marrow, cannot exclude hemosiderosis. IMPRESSION: 1. 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 2. Uterine adenomyosis. No definite thickening of the endometrium currently. 3. Cannot exclude hemosiderosis given the low T2 signal intensity in the marrow. 4. Scattered air-levels in mildly dilated loops of right-sided small bowel, nonspecific. Electronically Signed   By: Van Clines M.D.   On: 05/08/2019 15:15   US Pelvis Transvaginal Non-ob (tv Only)  Result Date: 05/06/2019 CLINICAL DATA:  33 year old female with a history of complex bilateral adnexal masses, suspected to represent endometriomas on prior MRI pelvis, presenting for follow-up. History of endometriosis. Uncertain LMP. EXAM: ULTRASOUND PELVIS TRANSVAGINAL TECHNIQUE: Transvaginal ultrasound examination of the pelvis was performed including evaluation of the uterus, ovaries, adnexal regions, and pelvic cul-de-sac. COMPARISON:  05/02/2019 CT abdomen/pelvis.  12/02/2018 MRI pelvis. FINDINGS: Uterus Measurements: 10.9 x 3.3 x 5.1 cm = volume: 98 mL. Retroverted and anteflexed uterus is normal in size and configuration, with no uterine fibroids or other myometrial abnormalities. Endometrium Thickness: 10 mm. No endometrial cavity fluid or focal  endometrial mass. Right ovary Measurements: 10.6 x 4.6 x 4.3 cm = volume: 110 mL. There are multiple complex right ovarian cysts demonstrating variable low level internal echoes, largest 3.2 x 3.1 x 1.6 cm, compared to 3.1 x 2.9 x 1.5 cm on 12/02/2018 MRI using similar measurement technique, similar. No appreciable internal vascularity within the right ovarian cysts on Doppler. Left ovary Measurements: 8.4 x 5.6 x 4.9 cm = volume: 123 mL. There are multiple complex left ovarian cysts demonstrating variable low level internal echoes, largest 4.5 x 3.4 x 4.0 cm, compared to 4.1 x 2.7 x 3.4 cm on 12/02/2018 MRI using similar measurement technique, similar to mildly increased. No appreciable internal vascularity within the left ovarian cysts on Doppler. Other findings:  No abnormal free fluid. IMPRESSION: 1. Multiple complex bilateral ovarian cysts with variable low level internal echoes and with no appreciable internal vascularity on color Doppler, similar to mildly increased in size since 12/02/2018 pelvic MRI study, where they were characterized as endometriomas. 2. Please note that the new 6.8 x 4.9 cm low-attenuation anterior pelvic mass visualized superior to the right bladder on the recent 05/02/2027 unenhanced CT study cannot be evaluated on this transvaginal-only pelvic ultrasound study (transabdominal pelvic ultrasound study was not ordered). Recommend further evaluation of this pelvic mass with MRI pelvis without and with IV contrast. Alternatively, CT abdomen/pelvis performed with oral and IV contrast could be obtained initially. 3. Normal retroverted uterus. Electronically Signed   By: Ilona Sorrel M.D.   On: 05/06/2019 12:57   Dg Abd 2 Views  Result Date: 05/03/2019 CLINICAL DATA:  Bilateral lower abdominal pain and low back spasms  causing shortness of breath. EXAM: ABDOMEN - 2 VIEW COMPARISON:  CT abdomen dated 05/02/2019. FINDINGS: Persistent gaseous distention of small-bowel loops within the central  abdomen, stable to increased compared to yesterday's CT, with associated air-fluid levels suggesting mechanical obstruction. Suspect some new distension of the large bowel. No evidence of free intraperitoneal air. No evidence of renal or ureteral calculi. IMPRESSION: Evidence of bowel obstruction, possibly large bowel as well as small bowel involvement. These results will be called to the ordering clinician or representative by the Radiologist Assistant, and communication documented in the PACS or zVision Dashboard. Electronically Signed   By: Franki Cabot M.D.   On: 05/03/2019 13:03   Dg Abd Portable 1v  Result Date: 05/04/2019 CLINICAL DATA:  Abdominal pain with recent bowel obstruction EXAM: PORTABLE ABDOMEN - 1 VIEW COMPARISON:  May 04, 2019 study obtained earlier in the day FINDINGS: Nasogastric tube tip and side port are in the stomach. There remain loops of mildly dilated bowel without air-fluid levels. No free air. Dilute contrast remains within loops of small bowel. No abnormal calcifications. IMPRESSION: Nasogastric tube tip and side port in stomach. Bowel appear similar to earlier in the day with mildly dilated bowel, likely due to persistent degree of bowel obstruction. No free air. Electronically Signed   By: Lowella Grip III M.D.   On: 05/04/2019 17:15   Dg Abd Portable 1v-small Bowel Obstruction Protocol-initial, 8 Hr Delay  Result Date: 05/04/2019 CLINICAL DATA:  Small bowel protocol, 8 hour delay EXAM: PORTABLE ABDOMEN - 1 VIEW COMPARISON:  05/03/2019, 05/02/2019 FINDINGS: Esophageal tube tip is in the left upper quadrant. Persistent dilatation of primarily small bowel, measuring up to 4.6 cm. Dilute contrast within dilated small bowel loops. No definitive contrast within the colon. Curvilinear metallic opacity over the central abdomen may represent external artifact IMPRESSION: Findings consistent with small bowel obstruction. Dilute contrast within dilated loops of small bowel.  Electronically Signed   By: Donavan Foil M.D.   On: 05/04/2019 02:20   CMP Latest Ref Rng & Units 05/10/2019 05/09/2019 05/07/2019  Glucose 70 - 99 mg/dL 112(H) 101(H) 107(H)  BUN 6 - 20 mg/dL <5(L) 5(L) 13  Creatinine 0.44 - 1.00 mg/dL 1.33(H) 1.34(H) 1.29(H)  Sodium 135 - 145 mmol/L 140 138 139  Potassium 3.5 - 5.1 mmol/L 3.8 3.2(L) 3.4(L)  Chloride 98 - 111 mmol/L 111 110 113(H)  CO2 22 - 32 mmol/L 23 21(L) 19(L)  Calcium 8.9 - 10.3 mg/dL 8.5(L) 8.5(L) 8.2(L)  Total Protein 6.5 - 8.1 g/dL - - -  Total Bilirubin 0.3 - 1.2 mg/dL - - -  Alkaline Phos 38 - 126 U/L - - -  AST 15 - 41 U/L - - -  ALT 0 - 44 U/L - - -  IMP: 1)bilateral tubo-ovarian complexes Currently on mefoxin/doxycycline day 9  With decreasing wbc Disc case with Dr Vernard Gambles ( IR) . Appears amenable to drainage. Will proceed for diganostic and therapeutic purpose. Cont antibiotics( need 2 wk total) 2) Symptomatic anemia.  2u PRBC with follow 2nd IV feraheme one wk post last dose  Pt being transfused 3) hypokalemia  will replete with oral once intake resume 4) partial SBO vs ileus improved

## 2019-05-10 NOTE — Sedation Documentation (Signed)
Patient is resting comfortably. 

## 2019-05-10 NOTE — Procedures (Signed)
Interventional Radiology Procedure Note  Procedure: Placement of a 45F drain into the RLQ abscess with aspiration of 70 mL thick, purulent material.   Complications: None  Estimated Blood Loss: None  Recommendations: - Drain to JP - Flush once per shift - Cultures sent  Signed,  Criselda Peaches, MD

## 2019-05-11 DIAGNOSIS — Z9889 Other specified postprocedural states: Secondary | ICD-10-CM

## 2019-05-11 DIAGNOSIS — Z978 Presence of other specified devices: Secondary | ICD-10-CM

## 2019-05-11 LAB — BASIC METABOLIC PANEL
Anion gap: 6 (ref 5–15)
BUN: <5 mg/dL — ABNORMAL LOW (ref 6–20)
CO2: 24 mmol/L (ref 22–32)
Calcium: 8.4 mg/dL — ABNORMAL LOW (ref 8.9–10.3)
Chloride: 109 mmol/L (ref 98–111)
Creatinine, Ser: 1.06 mg/dL — ABNORMAL HIGH (ref 0.44–1.00)
GFR calc Af Amer: >60 mL/min (ref >60)
GFR calc non Af Amer: >60 mL/min (ref >60)
Glucose, Bld: 84 mg/dL (ref 70–99)
Potassium: 4.3 mmol/L (ref 3.5–5.1)
Sodium: 139 mmol/L (ref 135–145)

## 2019-05-11 LAB — BPAM RBC
Blood Product Expiration Date: 202007282359
ISSUE DATE / TIME: 202007210940
Unit Type and Rh: 9500

## 2019-05-11 LAB — TYPE AND SCREEN
ABO/RH(D): O NEG
Antibody Screen: NEGATIVE
Unit division: 0

## 2019-05-11 LAB — CBC
HCT: 22.7 % — ABNORMAL LOW (ref 36.0–46.0)
Hemoglobin: 7.5 g/dL — ABNORMAL LOW (ref 12.0–15.0)
MCH: 25.7 pg — ABNORMAL LOW (ref 26.0–34.0)
MCHC: 33 g/dL (ref 30.0–36.0)
MCV: 77.7 fL — ABNORMAL LOW (ref 80.0–100.0)
Platelets: 551 10*3/uL — ABNORMAL HIGH (ref 150–400)
RBC: 2.92 MIL/uL — ABNORMAL LOW (ref 3.87–5.11)
RDW: 18 % — ABNORMAL HIGH (ref 11.5–15.5)
WBC: 12.4 10*3/uL — ABNORMAL HIGH (ref 4.0–10.5)
nRBC: 0 % (ref 0.0–0.2)

## 2019-05-11 MED ORDER — HEPARIN SODIUM (PORCINE) 5000 UNIT/ML IJ SOLN
5000.0000 [IU] | Freq: Three times a day (TID) | INTRAMUSCULAR | Status: DC
  Administered 2019-05-11 – 2019-05-13 (×6): 5000 [IU] via SUBCUTANEOUS
  Filled 2019-05-11 (×9): qty 1

## 2019-05-11 MED ORDER — SENNOSIDES-DOCUSATE SODIUM 8.6-50 MG PO TABS
2.0000 | ORAL_TABLET | Freq: Two times a day (BID) | ORAL | Status: DC
  Administered 2019-05-11 – 2019-05-14 (×7): 2 via ORAL
  Filled 2019-05-11 (×7): qty 2

## 2019-05-11 NOTE — Progress Notes (Signed)
Referring Physician(s): Dr. Garwin Brothers  Supervising Physician: Corrie Mckusick  Patient Status:  [facility] - In-pt  Chief Complaint: Follow up pelvic drain placed 7/21 by Dr. Laurence Ferrari  Subjective:  Patient ambulating in hallway, talking on the phone. No complaints expressed.   Allergies: Gadolinium derivatives, Shellfish allergy, and Sulfa antibiotics  Medications: Prior to Admission medications   Medication Sig Start Date End Date Taking? Authorizing Provider  Black Currant Seed Oil 500 MG CAPS Take 1 capsule by mouth daily.   Yes [provider]  ferrous sulfate 325 (65 FE) MG tablet Take 325 mg by mouth daily with breakfast.   Yes [provider]  Peppermint Oil (IBGARD PO) Take 1 capsule by mouth daily.   Yes [provider]  vitamin E 1000 UNIT capsule Take 1,000 Units by mouth daily.   Yes [provider]     Vital Signs: BP 129/84 (BP Location: Left Arm)    Pulse 85    Temp 98.6 F (37 C) (Oral)    Resp 15    Ht 5\' 4"  (1.626 m)    Wt 193 lb (87.5 kg)    SpO2 99%    BMI 33.13 kg/m   Physical Exam Vitals signs and nursing note reviewed.  HENT:     Head: Normocephalic.  Cardiovascular:     Rate and Rhythm: Normal rate.  Pulmonary:     Effort: Pulmonary effort is normal.  Abdominal:     Comments: (+) pelvic drain to JP with purulent OP; insertion site unremarkable.   Skin:    General: Skin is warm and dry.  Neurological:     Mental Status: She is alert. Mental status is at baseline.     Imaging: Dg Abd 1 View  Result Date: 05/10/2019 CLINICAL DATA:  Abdominal pain and distention. EXAM: ABDOMEN - 1 VIEW COMPARISON:  Radiograph May 09, 2019. FINDINGS: Dilated small bowel loop seen in left upper quadrant on prior exam are no longer visualized. Improved probable dilated colonic loop is noted in right lower quadrant. Stool appears to be in the right colon and rectum. No radio-opaque calculi or other significant radiographic abnormality  are seen. IMPRESSION: No definite evidence of small bowel obstruction or ileus is noted currently. Electronically Signed   By: Marijo Conception M.D.   On: 05/10/2019 10:13   Dg Abd 1 View  Result Date: 05/09/2019 CLINICAL DATA:  Right lower quadrant abdominal pain. Bowel obstruction. EXAM: ABDOMEN - 1 VIEW COMPARISON:  05/08/2019; 05/07/2019; CT abdomen pelvis-05/02/2019 FINDINGS: Grossly unchanged gaseous distension of several rather featureless loops of presumed small bowel within the right upper abdominal quadrant with index loop measuring approximately 5.3 cm in diameter. There is persistent patulous distension of several loops of small bowel within the left mid hemiabdomen with index loop measuring approximately 3.6 cm in diameter. These findings are again associated with a conspicuous paucity of distal colonic gas. No pneumoperitoneum, pneumatosis or portal venous gas. Punctate phleboliths overlie the left hemipelvis. Otherwise, no definitive abnormal intra-abdominal calcifications. No acute osseous abnormalities. IMPRESSION: Similar findings again worrisome for small bowel obstruction. Clinical correlation is advised. Electronically Signed   By: Sandi Mariscal M.D.   On: 05/09/2019 07:57   Dg Abd 1 View  Result Date: 05/08/2019 CLINICAL DATA:  Bowel obstruction; abdomen pain, vomiting since 05/02/19. Ovarian cysts. Hx of IBS, endometriosis, constipation. EXAM: ABDOMEN - 1 VIEW COMPARISON:  Plain film of the abdomen dated 05/07/2019. CT abdomen dated 05/02/2019. FINDINGS: Persistent mildly prominent gas-filled loops  of small bowel within the LEFT upper quadrant. Overall bowel gas pattern is stable. No evidence of soft tissue mass or abnormal fluid collection. No evidence of free intraperitoneal air. Osseous structures are unremarkable. IMPRESSION: Stable plain film exam of the abdomen. Persistent mildly prominent gas-filled loops of small bowel within the LEFT upper quadrant. Bowel gas pattern appears  otherwise nonobstructive. Electronically Signed   By: Franki Cabot M.D.   On: 05/08/2019 09:57   Mr Pelvis W Wo Contrast  Result Date: 05/08/2019 CLINICAL DATA:  Pelvic inflammatory disease, pyosalpinx. Cystic adnexal lesions. EXAM: MRI PELVIS WITHOUT AND WITH CONTRAST TECHNIQUE: Multiplanar multisequence MR imaging of the pelvis was performed both before and after administration of intravenous contrast. CONTRAST:  9 cc Gadavist COMPARISON:  12/02/2018 FINDINGS: Urinary Tract:  Unremarkable Bowel: Scattered air-fluid levels in mildly dilated loops of right abdominal small bowel. Vascular/Lymphatic: Unremarkable Reproductive: Bilateral serpentine and cystic adnexal lesions are present and ill-defined against a background of generalized edema tracking along fascia planes in the pelvis, and scattered additional lesions with internal complex fluid and marginal thick enhancement which are difficult to separate from bowel. Given the tubular appearance of the adnexal collections I do think that complex hydrosalpinx or pyosalpinx is a differential diagnostic consideration. The dominant left-sided lesion measures 8.3 by 4.3 by 4.1 cm (volume = 77 cm^3) and has mixed high and low T2 signal and mixed high and low T1 signal. The dominant right-sided lesion measures 4.8 by 3.0 by 4.4 cm (volume = 33 cm^3)in likewise demonstrates mixed internal signal intensity and somewhat thickened enhancing margins. The uterus is mildly low in position which may be a manifestation of mild pelvic floor laxity. The uterus measures 7.9 by 4.6 by 5.2 cm (volume = 99 cm^3). Thickened junctional zone on T2 weighted images suggests adenomyosis. The ovaries themselves are obscured by the surrounding adnexal cystic lesions and are difficult to the measure. Other: I suspect that there are also multiple rim enhancing additional lesions in the pelvis. For example, adjacent to the cecum along the right paracolic border, a 5.9 by 3.7 by 6.2 cm fluid  collection with enhancing margins is present. This is adjacent to a 4.2 by 1.5 cm collection on image 11/13. Along the left paracolic gutter, a cystic lesion with thick enhancing margins measures 2 point 7 by 2.0 cm on image 53/13, and there are multiple small presumed abscesses in the perirectal space shown on images 61-93 of series 13. Additional possible small abscess in the upper abdomen measuring 3.5 by 1.4 cm on image 19/13 although this could conceivably represent a loop of bowel rather than an abscess. Above the urinary bladder eccentric to the right I suspect that there is a 6.3 by 3.1 by 3.9 cm abscess potentially containing gas, nestled below a loop of bowel. Musculoskeletal: Edema along the umbilicus. Low T2 signal intensity in the marrow, cannot exclude hemosiderosis. IMPRESSION: 1. 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 2. Uterine adenomyosis. No definite thickening of the endometrium currently. 3. Cannot exclude hemosiderosis given the low T2 signal intensity in the marrow. 4. Scattered air-levels in mildly dilated loops of right-sided small bowel, nonspecific. Electronically Signed   By: Van Clines M.D.   On: 05/08/2019 15:15   Ct Image Guided Fluid Drain By Catheter  Result Date: 05/10/2019 INDICATION: 33 year old female with pelvic  inflammatory disease and multiple pelvic abscesses. She presents for placement of a CT-guided drainage catheter into the right lower quadrant abscess. EXAM: CT-guided abscess drain placement MEDICATIONS: The patient is currently admitted to the hospital and receiving intravenous antibiotics. The antibiotics were administered within an appropriate time frame prior to the initiation of the procedure.  ANESTHESIA/SEDATION: Fentanyl 100 mcg IV; Versed 2 mg IV Moderate Sedation Time:  13 minutes The patient was continuously monitored during the procedure by the interventional radiology nurse under my direct supervision. COMPLICATIONS: None immediate. PROCEDURE: Informed written consent was obtained from the patient after a thorough discussion of the procedural risks, benefits and alternatives. All questions were addressed. A timeout was performed prior to the initiation of the procedure. A planning axial CT scan was performed. The fluid collection in the right lower quadrant was successfully identified. The overlying skin was sterilely prepped and draped in the standard sterile fashion using chlorhexidine skin prep. Local anesthesia was attained by infiltration with 1% lidocaine. A 22 gauge spinal needle was advanced into the fluid collection and imaged with intermittent CT guidance. Next, a small dermatotomy was made. Using tandem trocar technique, a 12 French cook all-purpose drainage catheter was advanced through the abdominal wall and into the fluid collection. The catheter was formed. Aspiration yields 70 mL of thick, purulent material. A sample was sent for Gram stain and culture. The drain was flushed and connected to JP bulb suction and then secured to the skin with 0 Prolene suture. Follow-up CT imaging demonstrates a well-positioned drainage catheter with total resolution of the fluid collection. IMPRESSION: Successful placement of a 15 French drain into the right lower quadrant fluid collection with aspiration of 70 mL thick, purulent material. PLAN: 1. Follow cultures and adjust antibiotic coverage as needed. 2. Drain to JP bulb suction. 3. Flush drain at least once per shift. 4. Once drain output is minimal for 48-72 hours, the drainage catheter can be removed. Signed, Criselda Peaches, MD, [facility] Radiology Electronically Signed   By: Jacqulynn Cadet M.D.   On: 05/10/2019 17:02    Labs:  CBC: Recent Labs    05/07/19 0445 05/09/19 0743 05/10/19 0605 05/10/19 1802 05/11/19 0427  WBC 23.6* 18.5* 12.9*  --  12.4*  HGB 7.7* 7.4* 6.5* 7.4* 7.5*  HCT 24.0* 22.6* 20.0* 22.2* 22.7*  PLT 322 491* 496*  --  551*    COAGS: Recent Labs    05/09/19 1543  INR 1.2    BMP: Recent Labs    05/07/19 0445 05/09/19 0743 05/10/19 0605 05/11/19 0427  NA 139 138 140 139  K 3.4* 3.2* 3.8 4.3  CL 113* 110 111 109  CO2 19* 21* 23 24  GLUCOSE 107* 101* 112* 84  BUN 13 5* <5* <5*  CALCIUM 8.2* 8.5* 8.5* 8.4*  CREATININE 1.29* 1.34* 1.33* 1.06*  GFRNONAA 55* 53* 53* >60  GFRAA >60 >60 >60 >60    LIVER FUNCTION TESTS: Recent Labs    09/09/18 1720 05/02/19 0918  BILITOT 0.4 2.1*  AST 14* 19  ALT 10 16  ALKPHOS 64 63  PROT 7.7 8.2*  ALBUMIN 3.9 2.8*    Assessment and Plan:  33 y/o F s/p pelvic drain placed yesterday by Dr. Laurence Ferrari - 70 cc purulent OP at time of placement with 20 cc recorded in I/O today. Cultures pending - preliminary read shows no growth, WBC 12.4, hgb 7.5, plt 551. Tmax 99.4. She continues on doxycycline + cefoxitin  per primary team.  Continue current drain management. IR will continue to follow. Please call with questions or concerns.  Electronically Signed: Joaquim Nam, PA-C 05/11/2019, 1:38 PM   I spent a total of 15 Minutes at the the patient's bedside AND on the patient's hospital floor or unit, greater than 50% of which was counseling/coordinating care for pelvic drain follow up.

## 2019-05-11 NOTE — Progress Notes (Signed)
HD 10 Pt comfortable, notes pain where drain placed Denies sob/cp - working on IS +bm; denies fever/chills/sweats; has ambulated in hall this morning w/o problem  Temp:  [98.6 F (37 C)-99.4 F (37.4 C)] 98.7 F (37.1 C) (07/22 0535) Pulse Rate:  [65-100] 80 (07/22 0535) Resp:  [10-22] 19 (07/22 0535) BP: (143-170)/(83-100) 145/92 (07/22 0535) SpO2:  [97 %-100 %] 100 % (07/22 0535)  A&ox3 rrr ctab Abd: soft, nt, nd; appropriately tender at drain placement; dressing c/d/i; +BS LE: trace edema, nt bilat  CBC Latest Ref Rng & Units 05/11/2019 05/10/2019 05/10/2019  WBC 4.0 - 10.5 K/uL 12.4(H) - 12.9(H)  Hemoglobin 12.0 - 15.0 g/dL 7.5(L) 7.4(L) 6.5(LL)  Hematocrit 36.0 - 46.0 % 22.7(L) 22.2(L) 20.0(L)  Platelets 150 - 400 K/uL 551(H) - 496(H)   CMP Latest Ref Rng & Units 05/11/2019 05/10/2019 05/09/2019  Glucose 70 - 99 mg/dL 84 112(H) 101(H)  BUN 6 - 20 mg/dL <5(L) <5(L) 5(L)  Creatinine 0.44 - 1.00 mg/dL 1.06(H) 1.33(H) 1.34(H)  Sodium 135 - 145 mmol/L 139 140 138  Potassium 3.5 - 5.1 mmol/L 4.3 3.8 3.2(L)  Chloride 98 - 111 mmol/L 109 111 110  CO2 22 - 32 mmol/L 24 23 21(L)  Calcium 8.9 - 10.3 mg/dL 8.4(L) 8.5(L) 8.5(L)  Total Protein 6.5 - 8.1 g/dL - - -  Total Bilirubin 0.3 - 1.2 mg/dL - - -  Alkaline Phos 38 - 126 U/L - - -  AST 15 - 41 U/L - - -  ALT 0 - 44 U/L - - -     A/P: 33 y/o G0 1. PID/pelvic masses - s/p IR drainage 1 day ago, 70cc purulent fluid drained, continues to drain, cultures pending, wbc improving; contin cefoxitin/doxycycline; plan 2 wks oral abx with d/c home pending cultures 2. Chronic Anemia with acute change - s/p transfusion, no c/o today, appears dilutional and no signs active bleeding 3. Hypokalemia - now wnl 4. Partial sbo vs ileus - improved

## 2019-05-11 NOTE — Progress Notes (Addendum)
   Subjective:  Pt seen on rounds this AM. Seen standing at the bedside. Says she feels better today. Has some pain in her abdomen, but has improved. Breathing has improved since being in the hospital. She uses the incentive spirometer on commercial breaks. Encouraged to stay active and walk around as much as she can tolerate. The patient does note that her R leg seems to swell up more than the L with activity.   Objective:  Vital signs in last 24 hours: Vitals:   05/10/19 1635 05/10/19 1647 05/10/19 2037 05/11/19 0535  BP: (!) 150/96 (!) 154/96 (!) 143/97 (!) 145/92  Pulse: 71 88 100 80  Resp: 10 20 17 19   Temp:   98.9 F (37.2 C) 98.7 F (37.1 C)  TempSrc:   Oral Oral  SpO2: 100% 100% 97% 100%  Weight:      Height:       Physical Exam Constitutional:      General: She is not in acute distress. Cardiovascular:     Rate and Rhythm: Normal rate and regular rhythm.  Pulmonary:     Effort: Pulmonary effort is normal.     Breath sounds: Normal breath sounds.  Abdominal:     General: Bowel sounds are normal.     Palpations: Abdomen is soft.     Comments: JP drain RLQ.  Purulent fluid  Neurological:     Mental Status: She is alert.     Assessment/Plan:  Active Problems:   Pelvic mass   Small bowel obstruction ([place])  A 33 year old female who presented with worsening of abdominal pain and constipation.  #Pelvic mass ,Leukocytosis IR placed JP drain on 7/21.  Purulent discharge noted.  Patient on day 9 of antibiotics -Appreciate OB/Gyn recommendations -Appreciate IR surgery recs -Doxy and cefoxitin(Day9/14) for PID -Dilaudid1mg , q2h PRN, Severe Pain - Zofran PRN - AM CBC  #SBOvs Ileus: Resolved.  Patient is on Senokot.  No bowel movements today.  Bowel sounds normal and abdomen not distended. -Ambulate  #AKI:Creatinine 2.79>>>1.06. Baseline~1.01. Resolved with IV fluids. - AM BMP  #Edema:Patient has bilateral lower extremity edema.  We will  continue to monitor.  Worse on right leg. - Stop IVF - Continue ambulation - Rest on Left side when in bed  #Microcytic Anemia:Hgb 8.4, MCV 73, TIBC low 238. Iron 41.Ferritin 393.Elevated retics.Suspect chronic Iron deficiencyand acute inflammatory state.Hemoglobin in Nov 2019~8.1.S/P Feraheme7/17.  Patient O neg.patient received 1 unit RBC on 7/21 with appropriate response.  Hemoglobin 7.5 - Plan to give Feraheme 7/24  Diet: Regular VTE: Heparin Code: Full  #Dispo: Anticipated discharge in approximately1-2day(s).  Tamsen Snider, MD PGY1  [phone]

## 2019-05-12 LAB — BASIC METABOLIC PANEL
Anion gap: 8 (ref 5–15)
BUN: <5 mg/dL — ABNORMAL LOW (ref 6–20)
CO2: 25 mmol/L (ref 22–32)
Calcium: 8.9 mg/dL (ref 8.9–10.3)
Chloride: 107 mmol/L (ref 98–111)
Creatinine, Ser: 1.11 mg/dL — ABNORMAL HIGH (ref 0.44–1.00)
GFR calc Af Amer: >60 mL/min (ref >60)
GFR calc non Af Amer: >60 mL/min (ref >60)
Glucose, Bld: 84 mg/dL (ref 70–99)
Potassium: 4.6 mmol/L (ref 3.5–5.1)
Sodium: 140 mmol/L (ref 135–145)

## 2019-05-12 LAB — CBC
HCT: 25 % — ABNORMAL LOW (ref 36.0–46.0)
Hemoglobin: 8.1 g/dL — ABNORMAL LOW (ref 12.0–15.0)
MCH: 25.6 pg — ABNORMAL LOW (ref 26.0–34.0)
MCHC: 32.4 g/dL (ref 30.0–36.0)
MCV: 78.9 fL — ABNORMAL LOW (ref 80.0–100.0)
Platelets: 631 10*3/uL — ABNORMAL HIGH (ref 150–400)
RBC: 3.17 MIL/uL — ABNORMAL LOW (ref 3.87–5.11)
RDW: 19.3 % — ABNORMAL HIGH (ref 11.5–15.5)
WBC: 12.2 10*3/uL — ABNORMAL HIGH (ref 4.0–10.5)
nRBC: 0 % (ref 0.0–0.2)

## 2019-05-12 MED ORDER — SODIUM CHLORIDE 0.9 % IV SOLN
510.0000 mg | Freq: Once | INTRAVENOUS | Status: AC
  Administered 2019-05-13: 510 mg via INTRAVENOUS
  Filled 2019-05-12 (×2): qty 17

## 2019-05-12 MED ORDER — SODIUM CHLORIDE 0.9 % IV SOLN: 510.0000 mg | Freq: Once | INTRAVENOUS | Status: DC

## 2019-05-12 NOTE — Progress Notes (Addendum)
   Subjective: Pt seen on rounds this AM.  Preparing to eat breakfast. Feels good overall today. She says she had a solid bowel movement. Feels pulling sensation in the left flank once when she urinated.   Objective:  Vital signs in last 24 hours: Vitals:   05/11/19 0535 05/11/19 1300 05/11/19 2111 05/12/19 0524  BP: (!) 145/92 129/84 127/90 133/88  Pulse: 80 85 77 84  Resp: 19 15 16 18   Temp: 98.7 F (37.1 C) 98.6 F (37 C) 98.4 F (36.9 C) 98.5 F (36.9 C)  TempSrc: Oral Oral Oral Oral  SpO2: 100% 99% 100% 99%  Weight:      Height:       Physical Exam Constitutional:      Appearance: Normal appearance.  Cardiovascular:     Rate and Rhythm: Normal rate and regular rhythm.     Pulses: Normal pulses.     Heart sounds: Normal heart sounds.  Pulmonary:     Effort: Pulmonary effort is normal.     Breath sounds: Normal breath sounds.  Abdominal:     General: Bowel sounds are normal.     Palpations: Abdomen is soft.     Comments: Slightly distended  Neurological:     Mental Status: She is alert.     Assessment/Plan:  Active Problems:   Pelvic mass   Small bowel obstruction ([place])  33 year old femalewho p/wworsening of abdominal pain and constipation.  #Pelvic absecces,Leukocytosis  Leukocytosis improving to 12.2.  Afebrile serosanguineous fluid in JP drain.  Drain fluid culture NGTD. -Appreciate OB/Gyn recommendations -Appreciate IR recommendations -Doxy and cefoxitin(Day 10/14)  -Dilaudid1mg , q2h PRN, Severe Pain - Zofran PRN - Am CBC  #Microcytic Anemia:Hgb 8.4, MCV 73, TIBC low 238. Iron 41.Ferritin 393.Elevated retics.Suspect chronic Iron deficiencyand acute inflammatory state.Hemoglobin in Nov 2019~8.1. Patientremains asymptomatic. S/P Feraheme7/17. - Ferriheme on 7/24  #Edema:Patient has bilateral lower extremity edema. Getting better with ambulation and nutrition.  - Continue ambulation  #SBOvs Ileus: Resolved -Ambulate - Clear  liquid diet - Senokot BID   #AKI:Resolved. Creatinine 2.79>>>1.11. Baseline~1.01.  Diet:ClearLiquid VTE: Heparin Code: Full  #Dispo: Anticipated discharge in approximately 3-4 day(s).   Tamsen Snider, MD PGY1  [phone]

## 2019-05-12 NOTE — Progress Notes (Signed)
HD#11 Subjective: Feels better, tolerated regular diet, no nausea/ vomiting yet feels her abdomen is still heavy. Ambulated well, dizziness/fatigue/palpitations better since bllood transfusion with 2 pRBCs No vag bleeding.  Drain site pain and lower abdominal pain continue, but Dilaudid IV helping. Not tried oral analgesics yet   Objective: Vital signs in last 24 hours: Temp:  [98.4 F (36.9 C)-98.6 F (37 C)] 98.5 F (36.9 C) (07/23 0524) Pulse Rate:  [77-85] 84 (07/23 0524) Resp:  [15-18] 18 (07/23 0524) BP: (127-133)/(84-90) 133/88 (07/23 0524) SpO2:  [99 %-100 %] 99 % (07/23 0524) Weight change:  Last BM Date: 05/12/19  Intake/Output from previous day: 07/22 0701 - 07/23 0700 In: -  Out: 3800 [Urine:3800] Intake/Output this shift: Total I/O In: -  Out: 900 [Urine:900]  General appearance: alert and cooperative Head: NAD Resp: clear to auscultation bilaterally Cardio: regular rate and rhythm, S1, S2 normal, no murmur, click, rub or gallop GI: distention reduced, NABS, some lower abdominal and peri-drain tenderness Extremities: Homans sign is negative, no sign of DVT Incision/Wound: drain dressing dry IV site : no infection   Lab Results:  CBC Latest Ref Rng & Units 05/12/2019 05/11/2019 05/10/2019  WBC 4.0 - 10.5 K/uL 12.2(H) 12.4(H) -  Hemoglobin 12.0 - 15.0 g/dL 8.1(L) 7.5(L) 7.4(L)  Hematocrit 36.0 - 46.0 % 25.0(L) 22.7(L) 22.2(L)  Platelets 150 - 400 K/uL 631(H) 551(H) -   CMP Latest Ref Rng & Units 05/12/2019 05/11/2019 05/10/2019  Glucose 70 - 99 mg/dL 84 84 112(H)  BUN 6 - 20 mg/dL <5(L) <5(L) <5(L)  Creatinine 0.44 - 1.00 mg/dL 1.11(H) 1.06(H) 1.33(H)  Sodium 135 - 145 mmol/L 140 139 140  Potassium 3.5 - 5.1 mmol/L 4.6 4.3 3.8  Chloride 98 - 111 mmol/L 107 109 111  CO2 22 - 32 mmol/L 25 24 23   Calcium 8.9 - 10.3 mg/dL 8.9 8.4(L) 8.5(L)  Total Protein 6.5 - 8.1 g/dL - - -  Total Bilirubin 0.3 - 1.2 mg/dL - - -  Alkaline Phos 38 - 126 U/L - - -  AST 15 - 41  U/L - - -  ALT 0 - 44 U/L - - -    Medications:  Scheduled: . feeding supplement  1 Container Oral TID BM  . feeding supplement (PRO-STAT SUGAR FREE 64)  30 mL Oral BID  . heparin  5,000 Units Subcutaneous Q8H  . pantoprazole (PROTONIX) IV  40 mg Intravenous Q12H  . senna-docusate  2 tablet Oral BID  . sodium chloride flush  10-40 mL Intracatheter Q12H  . sodium chloride flush  3 mL Intravenous Q12H  . sodium chloride flush  5 mL Intracatheter Q8H  . Urelle  1 tablet Oral QID   Continuous: . cefOXitin 2 g (05/12/19 0552)  . 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  -On Mefoxin/ Doxy IV since 7/14 (10th/14 days) - Drain fluid culture -negative growth on prelim at 24 hrs. If final growth negative as well, can consider discharge home on oral antibiotics to complete total 14 days, switch to PO Metronidazole 500mg  BID and Doxycycline 100mg  BID for 4 days  -Consider switching to oral analgesics, changing to PO Dilaudid 1 mg every 6 hrs/prn, add Colace -Dizziness/palpitations improved with blood transfusion, she is s/p IV Feraheme one dose   Follow with Gyn office in  7-10 days following discharge Surgery planning with general surgery and Gyn at  later point after acute infection related changes have resolved. Spoke with patient re need for multidisciplinary team needed in surgery, risk of bowel injury and likely need to removed fallopian tubes but goal would be retain ovaries and uterus for fertility option with IVF.   Gyn will continue to follow until discharge.  Gwendolyn Nguyen 05/12/2019, 12:01 PM Patient ID: Gwendolyn Nguyen, female   DOB: 11/20/1985, 33 y.o.   MRN: [id number]

## 2019-05-12 NOTE — Progress Notes (Signed)
Referring Physician(s): Cousins,S  Supervising Physician: Sandi Mariscal  Patient Status:  [facility] - In-pt  Chief Complaint:  Abdominal/pelvic pain  Subjective: Pt doing ok; still sore at rt lower abd drain site; denies N/V; also has discomfort at drain site with voiding/BM's   Allergies: Gadolinium derivatives, Shellfish allergy, and Sulfa antibiotics  Medications: Prior to Admission medications   Medication Sig Start Date End Date Taking? Authorizing Provider  Black Currant Seed Oil 500 MG CAPS Take 1 capsule by mouth daily.   Yes [provider]  ferrous sulfate 325 (65 FE) MG tablet Take 325 mg by mouth daily with breakfast.   Yes [provider]  Peppermint Oil (IBGARD PO) Take 1 capsule by mouth daily.   Yes [provider]  vitamin E 1000 UNIT capsule Take 1,000 Units by mouth daily.   Yes [provider]     Vital Signs: BP 133/88 (BP Location: Right Arm)    Pulse 84    Temp 98.5 F (36.9 C) (Oral)    Resp 18    Ht 5\' 4"  (1.626 m)    Wt 193 lb (87.5 kg)    SpO2 99%    BMI 33.13 kg/m   Physical Exam awake/alert; RLQ drain intact, output about 10 cc serosang fluid in JP bulb at this time; drain flushed with minimal return, site mildly tender  Imaging: Dg Abd 1 View  Result Date: 05/10/2019 CLINICAL DATA:  Abdominal pain and distention. EXAM: ABDOMEN - 1 VIEW COMPARISON:  Radiograph May 09, 2019. FINDINGS: Dilated small bowel loop seen in left upper quadrant on prior exam are no longer visualized. Improved probable dilated colonic loop is noted in right lower quadrant. Stool appears to be in the right colon and rectum. No radio-opaque calculi or other significant radiographic abnormality are seen. IMPRESSION: No definite evidence of small bowel obstruction or ileus is noted currently. Electronically Signed   By: Marijo Conception M.D.   On: 05/10/2019 10:13   Dg Abd 1 View  Result Date: 05/09/2019 CLINICAL DATA:  Right lower quadrant  abdominal pain. Bowel obstruction. EXAM: ABDOMEN - 1 VIEW COMPARISON:  05/08/2019; 05/07/2019; CT abdomen pelvis-05/02/2019 FINDINGS: Grossly unchanged gaseous distension of several rather featureless loops of presumed small bowel within the right upper abdominal quadrant with index loop measuring approximately 5.3 cm in diameter. There is persistent patulous distension of several loops of small bowel within the left mid hemiabdomen with index loop measuring approximately 3.6 cm in diameter. These findings are again associated with a conspicuous paucity of distal colonic gas. No pneumoperitoneum, pneumatosis or portal venous gas. Punctate phleboliths overlie the left hemipelvis. Otherwise, no definitive abnormal intra-abdominal calcifications. No acute osseous abnormalities. IMPRESSION: Similar findings again worrisome for small bowel obstruction. Clinical correlation is advised. Electronically Signed   By: Sandi Mariscal M.D.   On: 05/09/2019 07:57   Mr Pelvis W WL Contrast  Result Date: 05/08/2019 CLINICAL DATA:  Pelvic inflammatory disease, pyosalpinx. Cystic adnexal lesions. EXAM: MRI PELVIS WITHOUT AND WITH CONTRAST TECHNIQUE: Multiplanar multisequence MR imaging of the pelvis was performed both before and after administration of intravenous contrast. CONTRAST:  9 cc Gadavist COMPARISON:  12/02/2018 FINDINGS: Urinary Tract:  Unremarkable Bowel: Scattered air-fluid levels in mildly dilated loops of right abdominal small bowel. Vascular/Lymphatic: Unremarkable Reproductive: Bilateral serpentine and cystic adnexal lesions are present and ill-defined against a background of generalized edema tracking along fascia planes in the pelvis, and scattered additional lesions with internal complex fluid and marginal thick enhancement  which are difficult to separate from bowel. Given the tubular appearance of the adnexal collections I do think that complex hydrosalpinx or pyosalpinx is a differential diagnostic  consideration. The dominant left-sided lesion measures 8.3 by 4.3 by 4.1 cm (volume = 77 cm^3) and has mixed high and low T2 signal and mixed high and low T1 signal. The dominant right-sided lesion measures 4.8 by 3.0 by 4.4 cm (volume = 33 cm^3)in likewise demonstrates mixed internal signal intensity and somewhat thickened enhancing margins. The uterus is mildly low in position which may be a manifestation of mild pelvic floor laxity. The uterus measures 7.9 by 4.6 by 5.2 cm (volume = 99 cm^3). Thickened junctional zone on T2 weighted images suggests adenomyosis. The ovaries themselves are obscured by the surrounding adnexal cystic lesions and are difficult to the measure. Other: I suspect that there are also multiple rim enhancing additional lesions in the pelvis. For example, adjacent to the cecum along the right paracolic border, a 5.9 by 3.7 by 6.2 cm fluid collection with enhancing margins is present. This is adjacent to a 4.2 by 1.5 cm collection on image 11/13. Along the left paracolic gutter, a cystic lesion with thick enhancing margins measures 2 point 7 by 2.0 cm on image 53/13, and there are multiple small presumed abscesses in the perirectal space shown on images 61-93 of series 13. Additional possible small abscess in the upper abdomen measuring 3.5 by 1.4 cm on image 19/13 although this could conceivably represent a loop of bowel rather than an abscess. Above the urinary bladder eccentric to the right I suspect that there is a 6.3 by 3.1 by 3.9 cm abscess potentially containing gas, nestled below a loop of bowel. Musculoskeletal: Edema along the umbilicus. Low T2 signal intensity in the marrow, cannot exclude hemosiderosis. IMPRESSION: 1. Complex appearance with bilateral serpentine and cystic adnexal lesions probably representing pyosalpinx bilaterally, as well as scattered collections with thick enhancing margins along the lower paracolic gutters, perirectal space, and above the urinary bladder  suspicious for abscesses. 2. Uterine adenomyosis. No definite thickening of the endometrium currently. 3. Cannot exclude hemosiderosis given the low T2 signal intensity in the marrow. 4. Scattered air-levels in mildly dilated loops of right-sided small bowel, nonspecific. Electronically Signed   By: Van Clines M.D.   On: 05/08/2019 15:15   Ct Image Guided Fluid Drain By Catheter  Result Date: 05/10/2019 INDICATION: 33 year old female with pelvic inflammatory disease and multiple pelvic abscesses. She presents for placement of a CT-guided drainage catheter into the right lower quadrant abscess. EXAM: CT-guided abscess drain placement MEDICATIONS: The patient is currently admitted to the hospital and receiving intravenous antibiotics. The antibiotics were administered within an appropriate time frame prior to the initiation of the procedure. ANESTHESIA/SEDATION: Fentanyl 100 mcg IV; Versed 2 mg IV Moderate Sedation Time:  13 minutes The patient was continuously monitored during the procedure by the interventional radiology nurse under my direct supervision. COMPLICATIONS: None immediate. PROCEDURE: Informed written consent was obtained from the patient after a thorough discussion of the procedural risks, benefits and alternatives. All questions were addressed. A timeout was performed prior to the initiation of the procedure. A planning axial CT scan was performed. The fluid collection in the right lower quadrant was successfully identified. The overlying skin was sterilely prepped and draped in the standard sterile fashion using chlorhexidine skin prep. Local anesthesia was attained by infiltration with 1% lidocaine. A 22 gauge spinal needle was advanced into the fluid collection and imaged with intermittent CT  guidance. Next, a small dermatotomy was made. Using tandem trocar technique, a 12 French cook all-purpose drainage catheter was advanced through the abdominal wall and into the fluid collection. The  catheter was formed. Aspiration yields 70 mL of thick, purulent material. A sample was sent for Gram stain and culture. The drain was flushed and connected to JP bulb suction and then secured to the skin with 0 Prolene suture. Follow-up CT imaging demonstrates a well-positioned drainage catheter with total resolution of the fluid collection. IMPRESSION: Successful placement of a 74 French drain into the right lower quadrant fluid collection with aspiration of 70 mL thick, purulent material. PLAN: 1. Follow cultures and adjust antibiotic coverage as needed. 2. Drain to JP bulb suction. 3. Flush drain at least once per shift. 4. Once drain output is minimal for 48-72 hours, the drainage catheter can be removed. Signed, Criselda Peaches, MD, [facility] Radiology Electronically Signed   By: Jacqulynn Cadet M.D.   On: 05/10/2019 17:02    Labs:  CBC: Recent Labs    05/09/19 0743 05/10/19 0605 05/10/19 1802 05/11/19 0427 05/12/19 0330  WBC 18.5* 12.9*  --  12.4* 12.2*  HGB 7.4* 6.5* 7.4* 7.5* 8.1*  HCT 22.6* 20.0* 22.2* 22.7* 25.0*  PLT 491* 496*  --  551* 631*    COAGS: Recent Labs    05/09/19 1543  INR 1.2    BMP: Recent Labs    05/09/19 0743 05/10/19 0605 05/11/19 0427 05/12/19 0330  NA 138 140 139 140  K 3.2* 3.8 4.3 4.6  CL 110 111 109 107  CO2 21* 23 24 25   GLUCOSE 101* 112* 84 84  BUN 5* <5* <5* <5*  CALCIUM 8.5* 8.5* 8.4* 8.9  CREATININE 1.34* 1.33* 1.06* 1.11*  GFRNONAA 53* 53* >60 >60  GFRAA >60 >60 >60 >60    LIVER FUNCTION TESTS: Recent Labs    09/09/18 1720 05/02/19 0918  BILITOT 0.4 2.1*  AST 14* 19  ALT 10 16  ALKPHOS 64 63  PROT 7.7 8.2*  ALBUMIN 3.9 2.8*    Assessment and Plan: Pt with hx PID, mult pelvic abscesses; s/p RLQ abscess drainage 7/21; afebrile; WBC 12.2, hgb 8.1(7.5), creat 1.11; fluid cx- neg to date; monitor drain output closely and once < 10-15 cc/day excluding flush amount  consider removal; if WBC rises or pt clinically worsens obtain f/u CT   Electronically Signed: D. Rowe Robert, PA-C 05/12/2019, 8:57 AM   I spent a total of 15 minutes at the the patient's bedside AND on the patient's hospital floor or unit, greater than 50% of which was counseling/coordinating care for right abdominal abscess drain    Patient ID: Gwendolyn Nguyen, female   DOB: 03/11/1986, 33 y.o.   MRN: [id number]

## 2019-05-13 DIAGNOSIS — N739 Female pelvic inflammatory disease, unspecified: Secondary | ICD-10-CM

## 2019-05-13 LAB — CBC
HCT: 25.7 % — ABNORMAL LOW (ref 36.0–46.0)
Hemoglobin: 8.2 g/dL — ABNORMAL LOW (ref 12.0–15.0)
MCH: 25.3 pg — ABNORMAL LOW (ref 26.0–34.0)
MCHC: 31.9 g/dL (ref 30.0–36.0)
MCV: 79.3 fL — ABNORMAL LOW (ref 80.0–100.0)
Platelets: 663 10*3/uL — ABNORMAL HIGH (ref 150–400)
RBC: 3.24 MIL/uL — ABNORMAL LOW (ref 3.87–5.11)
RDW: 20.2 % — ABNORMAL HIGH (ref 11.5–15.5)
WBC: 10.8 10*3/uL — ABNORMAL HIGH (ref 4.0–10.5)
nRBC: 0 % (ref 0.0–0.2)

## 2019-05-13 MED ORDER — DOXYCYCLINE HYCLATE 100 MG PO TABS
100.0000 mg | ORAL_TABLET | Freq: Two times a day (BID) | ORAL | Status: DC
  Administered 2019-05-13 – 2019-05-14 (×3): 100 mg via ORAL
  Filled 2019-05-13 (×3): qty 1

## 2019-05-13 MED ORDER — HYDROMORPHONE HCL 2 MG PO TABS
4.0000 mg | ORAL_TABLET | Freq: Four times a day (QID) | ORAL | Status: DC | PRN
  Administered 2019-05-13 – 2019-05-14 (×4): 4 mg via ORAL
  Filled 2019-05-13 (×4): qty 2

## 2019-05-13 MED ORDER — HYDROMORPHONE HCL 2 MG PO TABS
1.0000 mg | ORAL_TABLET | Freq: Four times a day (QID) | ORAL | Status: DC | PRN
  Administered 2019-05-13: 1 mg via ORAL
  Filled 2019-05-13: qty 1

## 2019-05-13 MED ORDER — PANTOPRAZOLE SODIUM 40 MG PO TBEC
40.0000 mg | DELAYED_RELEASE_TABLET | Freq: Two times a day (BID) | ORAL | Status: DC
  Administered 2019-05-13 – 2019-05-14 (×2): 40 mg via ORAL
  Filled 2019-05-13 (×2): qty 1

## 2019-05-13 MED ORDER — METRONIDAZOLE 500 MG PO TABS
500.0000 mg | ORAL_TABLET | Freq: Two times a day (BID) | ORAL | Status: DC
  Administered 2019-05-13 – 2019-05-14 (×2): 500 mg via ORAL
  Filled 2019-05-13 (×3): qty 1

## 2019-05-13 NOTE — Plan of Care (Signed)

## 2019-05-13 NOTE — Progress Notes (Signed)
Referring Physician(s): Dr. Garwin Brothers  Supervising Physician: Corrie Mckusick  Patient Status:  [facility] - In-pt  Chief Complaint: Pelvic abscess  Subjective: Resting comfortably in bed.  Complains of pain at her drain site. For possible discharge soon.   Allergies: Gadolinium derivatives, Shellfish allergy, and Sulfa antibiotics  Medications: Prior to Admission medications   Medication Sig Start Date End Date Taking? Authorizing Provider  Black Currant Seed Oil 500 MG CAPS Take 1 capsule by mouth daily.   Yes [provider]  ferrous sulfate 325 (65 FE) MG tablet Take 325 mg by mouth daily with breakfast.   Yes [provider]  Peppermint Oil (IBGARD PO) Take 1 capsule by mouth daily.   Yes [provider]  vitamin E 1000 UNIT capsule Take 1,000 Units by mouth daily.   Yes [provider]     Vital Signs: BP (!) 139/98 (BP Location: Right Arm)   Pulse 85   Temp 98.7 F (37.1 C) (Oral)   Resp 18   Ht 5\' 4"  (1.626 m)   Wt 193 lb (87.5 kg)   SpO2 99%   BMI 33.13 kg/m   Physical Exam  NAD, alert, resting comfortably.  Abdomen:  RLQ/pelvic drain in place. Insertion site c/d/i.  Flushes easily.  Small amount of cloudy, serosangious fluid in bulb.   Imaging: Dg Abd 1 View  Result Date: 05/10/2019 CLINICAL DATA:  Abdominal pain and distention. EXAM: ABDOMEN - 1 VIEW COMPARISON:  Radiograph May 09, 2019. FINDINGS: Dilated small bowel loop seen in left upper quadrant on prior exam are no longer visualized. Improved probable dilated colonic loop is noted in right lower quadrant. Stool appears to be in the right colon and rectum. No radio-opaque calculi or other significant radiographic abnormality are seen. IMPRESSION: No definite evidence of small bowel obstruction or ileus is noted currently. Electronically Signed   By: Marijo Conception M.D.   On: 05/10/2019 10:13   Ct Image Guided Fluid Drain By Catheter  Result Date: 05/10/2019 INDICATION:  33 year old female with pelvic inflammatory disease and multiple pelvic abscesses. She presents for placement of a CT-guided drainage catheter into the right lower quadrant abscess. EXAM: CT-guided abscess drain placement MEDICATIONS: The patient is currently admitted to the hospital and receiving intravenous antibiotics. The antibiotics were administered within an appropriate time frame prior to the initiation of the procedure. ANESTHESIA/SEDATION: Fentanyl 100 mcg IV; Versed 2 mg IV Moderate Sedation Time:  13 minutes The patient was continuously monitored during the procedure by the interventional radiology nurse under my direct supervision. COMPLICATIONS: None immediate. PROCEDURE: Informed written consent was obtained from the patient after a thorough discussion of the procedural risks, benefits and alternatives. All questions were addressed. A timeout was performed prior to the initiation of the procedure. A planning axial CT scan was performed. The fluid collection in the right lower quadrant was successfully identified. The overlying skin was sterilely prepped and draped in the standard sterile fashion using chlorhexidine skin prep. Local anesthesia was attained by infiltration with 1% lidocaine. A 22 gauge spinal needle was advanced into the fluid collection and imaged with intermittent CT guidance. Next, a small dermatotomy was made. Using tandem trocar technique, a 12 French cook all-purpose drainage catheter was advanced through the abdominal wall and into the fluid collection. The catheter was formed. Aspiration yields 70 mL of thick, purulent material. A sample was sent for Gram stain and culture. The drain was flushed and connected to JP bulb suction and then secured  to the skin with 0 Prolene suture. Follow-up CT imaging demonstrates a well-positioned drainage catheter with total resolution of the fluid collection. IMPRESSION: Successful placement of a 62 French drain into the right lower quadrant  fluid collection with aspiration of 70 mL thick, purulent material. PLAN: 1. Follow cultures and adjust antibiotic coverage as needed. 2. Drain to JP bulb suction. 3. Flush drain at least once per shift. 4. Once drain output is minimal for 48-72 hours, the drainage catheter can be removed. Signed, Criselda Peaches, MD, [facility] Radiology Electronically Signed   By: Jacqulynn Cadet M.D.   On: 05/10/2019 17:02    Labs:  CBC: Recent Labs    05/10/19 0605 05/10/19 1802 05/11/19 0427 05/12/19 0330 05/13/19 0407  WBC 12.9*  --  12.4* 12.2* 10.8*  HGB 6.5* 7.4* 7.5* 8.1* 8.2*  HCT 20.0* 22.2* 22.7* 25.0* 25.7*  PLT 496*  --  551* 631* 663*    COAGS: Recent Labs    05/09/19 1543  INR 1.2    BMP: Recent Labs    05/09/19 0743 05/10/19 0605 05/11/19 0427 05/12/19 0330  NA 138 140 139 140  K 3.2* 3.8 4.3 4.6  CL 110 111 109 107  CO2 21* 23 24 25   GLUCOSE 101* 112* 84 84  BUN 5* <5* <5* <5*  CALCIUM 8.5* 8.5* 8.4* 8.9  CREATININE 1.34* 1.33* 1.06* 1.11*  GFRNONAA 53* 53* >60 >60  GFRAA >60 >60 >60 >60    LIVER FUNCTION TESTS: Recent Labs    09/09/18 1720 05/02/19 0918  BILITOT 0.4 2.1*  AST 14* 19  ALT 10 16  ALKPHOS 64 63  PROT 7.7 8.2*  ALBUMIN 3.9 2.8*    Assessment and Plan: Tubo-ovarian vs. Pelvic abscess s/p drain placement in IR 7/21 Drain in place.  WBC 10.8 today; afebrile.  Culture with NGTD. Patient with pain at drain site likely related to position of drain. Continues with increased output 10-20 mL per day.  Patient may discharge home with drain in place if medically stable.  Outpatient orders placed for follow-up in IR drain clinic.  She will hear from schedulers with date and time of appointment.  Patient should continue to flush drain at home with 5-10 mL sterile saline daily and keep a record of daily drain output to bring to her appt in IR.   Electronically Signed: Docia Barrier, PA 05/13/2019, 12:19 PM   I spent a total of 15 Minutes at the the patient's bedside AND on the patient's hospital floor or unit, greater than 50% of which was counseling/coordinating care for pelvic abscess.

## 2019-05-13 NOTE — Progress Notes (Signed)
   Subjective: Pt seen on rounds at the bedside this AM. Interventional Radiology was was also in the room. Checked the surgical site and drain and looks good. There is a possibility of being discharged with the drain, but the pt is unsure of going home with the drain and concerned about pain control.  Having some pain on the R side and some on the L, thinks it may be due to increased walking yesterday. Diffuse abdominal soreness increased compared to yesterday. Still having BM. Ate some mashed potatoes and sweet potatoes yesterday and tolerated it well.   Objective:  Vital signs in last 24 hours: Vitals:   05/11/19 2111 05/12/19 0524 05/12/19 2143 05/13/19 0559  BP: 127/90 133/88 131/86 (!) 139/98  Pulse: 77 84 85 85  Resp: 16 18 18 18   Temp: 98.4 F (36.9 C) 98.5 F (36.9 C) 99 F (37.2 C) 98.7 F (37.1 C)  TempSrc: Oral Oral Oral Oral  SpO2: 100% 99% 100% 99%  Weight:      Height:       Physical Exam Constitutional:      General: She is not in acute distress. Cardiovascular:     Rate and Rhythm: Normal rate and regular rhythm.  Pulmonary:     Effort: Pulmonary effort is normal.     Breath sounds: Normal breath sounds.  Abdominal:     General: Bowel sounds are normal.     Tenderness: There is abdominal tenderness.     Comments: Tender to deep palpation in LLQ. Drain in place on RLQ, no erythema.   Musculoskeletal:     Right lower leg: Edema present.     Left lower leg: Edema present.  Neurological:     Mental Status: She is alert.     Assessment/Plan:  Active Problems:   Pelvic mass   Small bowel obstruction ([place])  33 year old femalewho p/wworsening of abdominal pain and constipation.  #Pelvic absecces Leukocytosis improving to 10.8.  Afebrile serosanguineous fluid in JP drain.  Drain fluid culture NGTD.  Will start p.o. antibiotics today and p.o. pain meds.  Plans to check in on patient this afternoon , appreciate recommendations from IR team, and possible  discharge today or tomorrow if patient continues to show improvement.  -Appreciate OB/Gyn recommendations -Appreciate IR recommendations -stopped Doxy and cefoxitin(Day10/14) , received one dose of cefoxitin this morning - started p.o. metronidazole 500 mg twice daily and p.o. doxy 100 mg twice daily - PO. hydromorphone 4 mg every 6 hours as needed - Zofran PRN    #Microcytic Anemia:Hgb 8.4, MCV 73, TIBC low 238. Iron 41.Ferritin 393.Elevated retics.Suspect chronic Iron deficiencyand acute inflammatory state.Hemoglobin in Nov 2019~8.1. Patientremains asymptomatic. S/P Feraheme7/17 and 2nd dose 7/24.   #Edema:Patient has bilateral lower extremity edema.  - Continue ambulation and nutrition  #SBOvs Ileus: Resolved -Ambulate - Clear liquid diet - Senokot BID   #AKI:Resolved. Creatinine 2.79>>>1.11. Baseline~1.01.  Diet:ClearLiquid VTE: Heparin Code: Full  #Dispo: Anticipated discharge in approximately 0-1 day(s).   Tamsen Snider, MD PGY1  [phone]

## 2019-05-13 NOTE — Progress Notes (Signed)
GYN  S: pt c/o pain not controlled with oral dilaudid (+) BM tol reg diet Lightheadedness and dizziness resolved since transfusion  O: 2nd IV feraheme infusing  Patient Vitals for the past 24 hrs:  BP Temp Temp src Pulse Resp SpO2  05/13/19 0559 (!) 139/98 98.7 F (37.1 C) Oral 85 18 99 %  05/12/19 2143 131/86 99 F (37.2 C) Oral 85 18 100 %  lungs clear to A Cor RRR Abd soft non distended (+) BS  right sided drain  ext. 2+ edema  JP drain: serosanginous fluid appears <10 cc ( has not been drained this am) CBC Latest Ref Rng & Units 05/13/2019 05/12/2019 05/11/2019  WBC 4.0 - 10.5 K/uL 10.8(H) 12.2(H) 12.4(H)  Hemoglobin 12.0 - 15.0 g/dL 8.2(L) 8.1(L) 7.5(L)  Hematocrit 36.0 - 46.0 % 25.7(L) 25.0(L) 22.7(L)  Platelets 150 - 400 K/uL 663(H) 631(H) 551(H)   Drain cultures neg to date  IMP: TOC ( bilateral) s/p IR drainage with improved wbc Currently on oral doxy/flagyl 2) IDA s/p IV iron x 2 doses 3) partial SBO resolved 4) ? Chronic HTN P) increased po Dilaudid. Mgmt of JP drain removal per IR with f/u CT scan outpt per their protocol Cont oral antibiotics( see Dr Benjie Karvonen note). [place] for d/c from gyn standpoint.  Pt will need to call office for appt with MD of choice Advised pt to call office with dose of home iron med ( which she has been taking daily). If so, will need different regimen If BP persists outside of hospital, will have pt see a PCP for mgmt

## 2019-05-14 DIAGNOSIS — N7093 Salpingitis and oophoritis, unspecified: Secondary | ICD-10-CM

## 2019-05-14 DIAGNOSIS — Z881 Allergy status to other antibiotic agents status: Secondary | ICD-10-CM

## 2019-05-14 DIAGNOSIS — Z888 Allergy status to other drugs, medicaments and biological substances status: Secondary | ICD-10-CM

## 2019-05-14 DIAGNOSIS — Z91013 Allergy to seafood: Secondary | ICD-10-CM

## 2019-05-14 DIAGNOSIS — N739 Female pelvic inflammatory disease, unspecified: Secondary | ICD-10-CM

## 2019-05-14 DIAGNOSIS — R5381 Other malaise: Secondary | ICD-10-CM

## 2019-05-14 LAB — CBC
HCT: 27.5 % — ABNORMAL LOW (ref 36.0–46.0)
Hemoglobin: 8.8 g/dL — ABNORMAL LOW (ref 12.0–15.0)
MCH: 25.7 pg — ABNORMAL LOW (ref 26.0–34.0)
MCHC: 32 g/dL (ref 30.0–36.0)
MCV: 80.4 fL (ref 80.0–100.0)
Platelets: 648 10*3/uL — ABNORMAL HIGH (ref 150–400)
RBC: 3.42 MIL/uL — ABNORMAL LOW (ref 3.87–5.11)
RDW: 20.7 % — ABNORMAL HIGH (ref 11.5–15.5)
WBC: 9.7 10*3/uL (ref 4.0–10.5)
nRBC: 0 % (ref 0.0–0.2)

## 2019-05-14 MED ORDER — ONDANSETRON HCL 4 MG PO TABS: 4.0000 mg | ORAL_TABLET | Freq: Three times a day (TID) | ORAL | 0 refills | Status: DC | PRN

## 2019-05-14 MED ORDER — SODIUM CHLORIDE 0.9% FLUSH: INTRAVENOUS | 0 refills | Status: DC

## 2019-05-14 MED ORDER — HYDROCODONE-ACETAMINOPHEN 10-325 MG PO TABS
1.0000 | ORAL_TABLET | Freq: Four times a day (QID) | ORAL | Status: DC | PRN
  Administered 2019-05-14: 1 via ORAL
  Filled 2019-05-14: qty 1

## 2019-05-14 MED ORDER — SENNOSIDES-DOCUSATE SODIUM 8.6-50 MG PO TABS: 2.0000 | ORAL_TABLET | Freq: Two times a day (BID) | ORAL | 0 refills | Status: DC

## 2019-05-14 MED ORDER — URELLE 81 MG PO TABS: 1.0000 | ORAL_TABLET | Freq: Four times a day (QID) | ORAL | 0 refills | Status: DC

## 2019-05-14 MED ORDER — HYDROCODONE-ACETAMINOPHEN 10-325 MG PO TABS: 1.0000 | ORAL_TABLET | Freq: Four times a day (QID) | ORAL | 0 refills | Status: AC | PRN

## 2019-05-14 MED ORDER — DOXYCYCLINE HYCLATE 100 MG PO TABS: 100.0000 mg | ORAL_TABLET | Freq: Two times a day (BID) | ORAL | 0 refills | Status: AC

## 2019-05-14 MED ORDER — METRONIDAZOLE 500 MG PO TABS: 500.0000 mg | ORAL_TABLET | Freq: Two times a day (BID) | ORAL | 0 refills | Status: DC

## 2019-05-14 NOTE — Progress Notes (Signed)
[name] to be D/C'd  per MD order. Discussed with the patient and all questions fully answered. Drain care discussed with the patient, this writer showed to patient how to flush drain.  VSS, Skin clean, dry and intact without evidence of skin break down, no evidence of skin tears noted.  IV catheter discontinued intact. Site without signs and symptoms of complications. Dressing and pressure applied.  An After Visit Summary was printed and given to the patient. Patient received prescription.  D/c education completed with patient/family including follow up instructions, medication list, d/c activities limitations if indicated, with other d/c instructions as indicated by MD - patient able to verbalize understanding, all questions fully answered.   Patient instructed to return to ED, call 911, or call MD for any changes in condition.   Patient to be escorted via [place], and [place] home via private auto.

## 2019-05-14 NOTE — Progress Notes (Signed)
   Subjective: Patient has a mild headache this morning , otherwise no acute changes overnight.  Reports good bowel movement yesterday.  Pain is well controlled.  She is prepared for discharge pending PT eval and instructions from IR on drain care.  Objective:  Vital signs in last 24 hours: Vitals:   05/13/19 0559 05/13/19 1524 05/13/19 2157 05/14/19 0548  BP: (!) 139/98 (!) 131/97 131/81 (!) 143/93  Pulse: 85 84 92 80  Resp: 18 18 19 18   Temp: 98.7 F (37.1 C) 98.7 F (37.1 C) 98.7 F (37.1 C) 98.5 F (36.9 C)  TempSrc: Oral Oral Oral Oral  SpO2: 99% 99% 97% 97%  Weight:      Height:       Physical Exam  Constitutional: She is well-developed, well-nourished, and in no distress.  Cardiovascular: Normal rate and regular rhythm.  Pulmonary/Chest: Effort normal and breath sounds normal.  Abdominal: Soft. Bowel sounds are normal. There is abdominal tenderness.  JP drain in RLQ. tenderness on deep palpation in RLQ and LLQ  Musculoskeletal:        General: No edema.  Skin: Skin is warm.     Assessment/Plan:  Active Problems:   Pelvic mass   Small bowel obstruction (HCC)  Gwendolyn Nguyen is a 33 year old female with past medical history of iron deficiency anemia, chronic tubo ovarian complexes, and endometriosis who presented with worsening of abdominal pain and constipation.  #Pelvic abscesses Patient is on day 11/14 of antibiotics for acute tuboovarian and paracolic pelvic abscesses.  Minimal fluid in the JP drain on exam, patient reports that drainage has slowed down.  Continues to experience same diffuse tenderness but says pain medication regimen has helped.  Possible discharge today after IR counsels patient and PT eval.  Patient will follow-up with OB/GYN in 7 to 10 days.  We will send her out with 3 days of antibiotics to finish a 14-day course [7/14-7/28].     -Appreciate OB/GYN recommendations -Appreciate our recommendations - P0 metronidazole 500 mg twice daily and P0  Doxy 100 mg twice daily -P0 hydromorphone 4 mg every 6 hours as needed -Zofran PRN  #Microcytic Anemia: Hx of chronic iron deficiency anemia.Labs during workup: Hgb 8.4, MCV 73, TIBC low 238. Iron 41.Ferritin 393.Elevated retics.Suspect chronic Iron deficiencyand acute inflammatory state.Hemoglobin in Nov 2019~8.1. Patientremains asymptomatic. S/P Feraheme7/17 and 2nd dose 7/24. -Patient with instructions to call OB/GYN with dosage she was taking at home and they will give her instructions going forward    #Edema:Bilateral lower extremity edema has improved with ambulation. - Continue ambulation and nutrition  #SBOvs Ileus:Resolved -Ambulate - Clear liquid diet - Senokot BID  #AKI:Resolved.Creatinine 2.79>>>1.11. Baseline~1.01.  Diet:ClearLiquid VTE: Heparin Code: Full  #Dispo: Anticipated discharge in approximately0-1 day(s).   Tamsen Snider, MD PGY1  [phone]

## 2019-05-14 NOTE — Plan of Care (Signed)

## 2019-05-14 NOTE — Evaluation (Signed)
Physical Therapy Evaluation Patient Details Name: Gwendolyn Nguyen MRN: [id number] DOB: July 04, 1986 Today's Date: 05/14/2019   History of Present Illness  33 year old femalewho p/wworsening of abdominal pain and constipation.  Pt with pelvic abcess wtih JP drain.  Pt also with SBO with ileus.   Clinical Impression  Pt admitted with above diagnosis. Pt currently with functional limitations due to the deficits listed below (see PT Problem List). Pt was able to ambulate with min guard assist with RW with good safety with RW.  Without RW pt requires min assist and cues.  Pt definitely slow and guarded gait.  Brother coming to assist pt at home therefore HHPT appropriate.  Will follow acutely.   Pt will benefit from skilled PT to increase their independence and safety with mobility to allow discharge to the venue listed below.      Follow Up Recommendations Home health PT;Supervision/Assistance - 24 hour    Equipment Recommendations  Rolling walker with 5" wheels    Recommendations for Other Services       Precautions / Restrictions Precautions Precautions: Fall Restrictions Weight Bearing Restrictions: No      Mobility  Bed Mobility Overal bed mobility: Independent                Transfers Overall transfer level: Needs assistance Equipment used: Rolling walker (2 wheeled) Transfers: Sit to/from Stand Sit to Stand: Supervision         General transfer comment: Pt was able to stand without assist  Ambulation/Gait Ambulation/Gait assistance: Min guard;Min assist Gait Distance (Feet): 510 Feet Assistive device: Rolling walker (2 wheeled);IV Pole;None Gait Pattern/deviations: Step-to pattern;Decreased stride length;Shuffle;Drifts right/left;Antalgic   Gait velocity interpretation: <1.31 ft/sec, indicative of household ambulator General Gait Details: Pt was able to ambulate with min guard assist while pushing IV pole however pt with poor posture leaning on IV pole.   Attemtpts without pole with pt needing min assist and could not progress steps as she had difficulty picking up feet and was unsteady.  Pt safest with RW at min guard assist and good stability with RW and pt agrees.  Pt took one sitting rest break at 180 feet.  Then completed distance of 510 feetn total.   Stairs            Wheelchair Mobility    Modified Rankin (Stroke Patients Only)       Balance Overall balance assessment: Needs assistance Sitting-balance support: No upper extremity supported;Feet supported Sitting balance-Leahy Scale: Fair     Standing balance support: No upper extremity supported;During functional activity;Bilateral upper extremity supported Standing balance-Leahy Scale: Fair Standing balance comment: relies on UE support for dynamic balance but can stand statically with min guard assist without UE support.              High level balance activites: Sudden stops;Direction changes;Turns High Level Balance Comments: min guard assist with above challenges             Pertinent Vitals/Pain Pain Assessment: Faces Faces Pain Scale: Hurts little more Pain Location: drain site Pain Descriptors / Indicators: Sore Pain Intervention(s): Limited activity within patient's tolerance;Monitored during session;Repositioned    Home Living Family/patient expects to be discharged to:: Private residence Living Arrangements: Alone Available Help at Discharge: Family;Available 24 hours/day(Brother will come stay with pt) Type of Home: House Home Access: Stairs to enter Entrance Stairs-Rails: Right Entrance Stairs-Number of Steps: 4 Home Layout: Two level;Bed/bath upstairs Home Equipment: None      Prior Function Level of Independence:  Independent               Hand Dominance        Extremity/Trunk Assessment   Upper Extremity Assessment Upper Extremity Assessment: Defer to OT evaluation    Lower Extremity Assessment Lower Extremity  Assessment: Generalized weakness    Cervical / Trunk Assessment Cervical / Trunk Assessment: Normal  Communication   Communication: No difficulties  Cognition Arousal/Alertness: Awake/alert Behavior During Therapy: WFL for tasks assessed/performed Overall Cognitive Status: Within Functional Limits for tasks assessed                                        General Comments      Exercises     Assessment/Plan    PT Assessment Patient needs continued PT services  PT Problem List Decreased balance;Decreased activity tolerance;Decreased mobility;Decreased knowledge of use of DME;Decreased safety awareness;Decreased knowledge of precautions;Pain       PT Treatment Interventions DME instruction;Gait training;Functional mobility training;Therapeutic activities;Stair training;Therapeutic exercise;Balance training;Patient/family education    PT Goals (Current goals can be found in the Care Plan section)  Acute Rehab PT Goals Patient Stated Goal: to gohome PT Goal Formulation: With patient Time For Goal Achievement: 05/28/19 Potential to Achieve Goals: Good    Frequency Min 3X/week   Barriers to discharge        Co-evaluation               AM-PAC PT "6 Clicks" Mobility  Outcome Measure Help needed turning from your back to your side while in a flat bed without using bedrails?: None Help needed moving from lying on your back to sitting on the side of a flat bed without using bedrails?: None Help needed moving to and from a bed to a chair (including a wheelchair)?: None Help needed standing up from a chair using your arms (e.g., wheelchair or bedside chair)?: A Little Help needed to walk in hospital room?: A Little Help needed climbing 3-5 steps with a railing? : A Little 6 Click Score: 21    End of Session Equipment Utilized During Treatment: Gait belt Activity Tolerance: Patient tolerated treatment well Patient left: with call bell/phone within  reach(on toilet) Nurse Communication: Mobility status PT Visit Diagnosis: Unsteadiness on feet (R26.81);Muscle weakness (generalized) (M62.81);Pain Pain - part of body: (abdomen)    Time: 1610-9604 PT Time Calculation (min) (ACUTE ONLY): 16 min   Charges:   PT Evaluation $PT Eval Moderate Complexity: [place] Pager:  [phone]  Office:  [phone]    Denice Paradise 05/14/2019, 11:23 AM

## 2019-05-14 NOTE — Care Management (Addendum)
RW ordered for patient to be delivered to room prior to d/c.   Referrals to outpatient PT sent as home health services are limited with patient's insurance.  Patient agrees and will attempt to arrange transportation to therapy appointments.

## 2019-05-15 LAB — AEROBIC/ANAEROBIC CULTURE W GRAM STAIN (SURGICAL/DEEP WOUND)
Culture: NO GROWTH
Special Requests: NORMAL

## 2019-05-16 ENCOUNTER — Other Ambulatory Visit: Payer: Self-pay | Admitting: Obstetrics and Gynecology

## 2019-05-16 DIAGNOSIS — N7093 Salpingitis and oophoritis, unspecified: Secondary | ICD-10-CM

## 2019-05-17 ENCOUNTER — Other Ambulatory Visit: Payer: Self-pay | Admitting: Obstetrics and Gynecology

## 2019-05-19 ENCOUNTER — Ambulatory Visit
Admission: RE | Admit: 2019-05-19 | Discharge: 2019-05-19 | Disposition: A | Discharge status: Home / Self Care | Payer: BC Managed Care – PPO | Source: Ambulatory Visit | Attending: Obstetrics and Gynecology | Admitting: Obstetrics and Gynecology

## 2019-05-19 ENCOUNTER — Ambulatory Visit
Admission: RE | Admit: 2019-05-19 | Discharge: 2019-05-19 | Disposition: A | Discharge status: Home / Self Care | Payer: BC Managed Care – PPO | Source: Ambulatory Visit | Attending: Student | Admitting: Student

## 2019-05-19 ENCOUNTER — Encounter: Payer: Self-pay | Admitting: Radiology

## 2019-05-19 DIAGNOSIS — N7093 Salpingitis and oophoritis, unspecified: Secondary | ICD-10-CM

## 2019-05-19 HISTORY — PX: IR RADIOLOGIST EVAL & MGMT: IMG5224

## 2019-05-19 NOTE — Progress Notes (Signed)
Interventional Radiology Progress Note   33 yo female with right pelvic abscess drained with 75F drain, 05/10/2019.  History of PID.   She returns for evaluation.  She states she has OB/GYN appointment on Monday.  She has had scant drainage from the drain, less than 10cc daily. Completed ABX Denies fevers/rigors/chills.  No pain.  Continues to flush drain, though is out of flushes.   CT today shows no abscess residual. INjection today shows no abscess, no fistula.   Recommendations:  - We recommend keeping drain until appt with OB.  At that time it may be reasonable to remove.  Our office is happy to assist.  - DC daily flushes - maintain drain to bulb suction, and continue to record output - call with question/concerns  Signed,  Dulcy Fanny. Earleen Newport, DO

## 2019-05-23 ENCOUNTER — Other Ambulatory Visit: Payer: Self-pay | Admitting: Obstetrics and Gynecology

## 2019-05-23 DIAGNOSIS — N739 Female pelvic inflammatory disease, unspecified: Secondary | ICD-10-CM

## 2019-05-24 ENCOUNTER — Encounter: Payer: Self-pay | Admitting: *Deleted

## 2019-05-24 ENCOUNTER — Ambulatory Visit
Admission: RE | Admit: 2019-05-24 | Discharge: 2019-05-24 | Disposition: A | Discharge status: Home / Self Care | Payer: BC Managed Care – PPO | Source: Ambulatory Visit | Attending: Obstetrics and Gynecology | Admitting: Obstetrics and Gynecology

## 2019-05-24 ENCOUNTER — Other Ambulatory Visit: Payer: Self-pay

## 2019-05-24 DIAGNOSIS — N739 Female pelvic inflammatory disease, unspecified: Secondary | ICD-10-CM

## 2019-05-24 HISTORY — PX: IR RADIOLOGIST EVAL & MGMT: IMG5224

## 2019-05-24 NOTE — Progress Notes (Signed)
Referring Physician(s): Cousins,Sheronette  Chief Complaint: The patient is seen in follow up today s/p right pelvic abscess drained with 22F drain, 05/10/2019.  History of PID.   History of present illness:  Dr Earleen Newport note 05/19/19:  She returns for evaluation.  She states she has OB/GYN appointment on Monday.  She has had scant drainage from the drain, less than 10cc daily. Completed ABX Denies fevers/rigors/chills.  No pain.  Continues to flush drain, though is out of flushes.   CT today shows no abscess residual. INjection today shows no abscess, no fistula.  Recommendations:  - We recommend keeping drain until appt with OB.  At that time it may be reasonable to remove.  Our office is happy to assist.  - DC daily flushes - maintain drain to bulb suction, and continue to record output - call with question/concerns  Pt has been seen by Dr Garwin Brothers yesterday Returns to [facility] for drain removal  Past Medical History:  Diagnosis Date  . Anemia   . Back pain   . Constipation   . Endometriosis   . IBS (irritable bowel syndrome)   . Low hemoglobin   . Multiple food allergies     Past Surgical History:  Procedure Laterality Date  . IR RADIOLOGIST EVAL & MGMT  05/19/2019    Allergies: Gadolinium derivatives, Shellfish allergy, and Sulfa antibiotics  Medications: Prior to Admission medications   Medication Sig Start Date End Date Taking? Authorizing Provider  Black Currant Seed Oil 500 MG CAPS Take 1 capsule by mouth daily.    [provider]  ferrous sulfate 325 (65 FE) MG tablet Take 325 mg by mouth daily with breakfast.    [provider]  metroNIDAZOLE (FLAGYL) 500 MG tablet Take 1 tablet (500 mg total) by mouth 2 (two) times daily. 05/14/19   [name], MD  ondansetron (ZOFRAN) 4 MG tablet Take 1 tablet (4 mg total) by mouth every 8 (eight) hours as needed for nausea. 05/14/19 06/13/19  [name], MD  Peppermint Oil (IBGARD PO)  Take 1 capsule by mouth daily.    [provider]  senna-docusate (SENOKOT-S) 8.6-50 MG tablet Take 2 tablets by mouth 2 (two) times daily. 05/14/19   [name], MD  sodium chloride flush (NS) 0.9 % SOLN 10 ml into drain daily 05/14/19   Madalyn Rob, MD  Urelle (URELLE/URISED) 81 MG TABS tablet Take 1 tablet (81 mg total) by mouth 4 (four) times daily. 05/14/19   [name], MD  vitamin E 1000 UNIT capsule Take 1,000 Units by mouth daily.    [provider]     Family History  Problem Relation Age of Onset  . Hyperlipidemia Mother   . Hypertension Mother   . Obesity Mother   . Hyperlipidemia Father   . Hypertension Father   . Obesity Father     Social History   Socioeconomic History  . Marital status: Single    Spouse name: Not on file  . Number of children: Not on file  . Years of education: Not on file  . Highest education level: Not on file  Occupational History  . Occupation: Social worker  Social Needs  . Financial resource strain: Not on file  . Food insecurity    Worry: Not on file    Inability: Not on file  . Transportation needs    Medical: Not on file    Non-medical: Not on file  Tobacco Use  . Smoking status: Never Smoker  .  Smokeless tobacco: Never Used  Substance and Sexual Activity  . Alcohol use: Yes    Comment: occassional  . Drug use: Not on file  . Sexual activity: Not on file  Lifestyle  . Physical activity    Days per week: Not on file    Minutes per session: Not on file  . Stress: Not on file  Relationships  . Social Herbalist on phone: Not on file    Gets together: Not on file    Attends religious service: Not on file    Active member of club or organization: Not on file    Attends meetings of clubs or organizations: Not on file    Relationship status: Not on file  Other Topics Concern  . Not on file  Social History Narrative  . Not on file     Vital Signs: BP (!) 134/96 (BP Location: Right  Arm)   Pulse 89   Temp 98.3 F (36.8 C)   SpO2 96%   Physical Exam Vitals signs reviewed.  Skin:    General: Skin is warm and dry.     Comments: Site is clean and dry NT no bleeding  Drain removed without complication Dressing placed      Imaging: No results found.  Labs:  CBC: Recent Labs    05/11/19 0427 05/12/19 0330 05/13/19 0407 05/14/19 0412  WBC 12.4* 12.2* 10.8* 9.7  HGB 7.5* 8.1* 8.2* 8.8*  HCT 22.7* 25.0* 25.7* 27.5*  PLT 551* 631* 663* 648*    COAGS: Recent Labs    05/09/19 1543  INR 1.2    BMP: Recent Labs    05/09/19 0743 05/10/19 0605 05/11/19 0427 05/12/19 0330  NA 138 140 139 140  K 3.2* 3.8 4.3 4.6  CL 110 111 109 107  CO2 21* 23 24 25   GLUCOSE 101* 112* 84 84  BUN 5* <5* <5* <5*  CALCIUM 8.5* 8.5* 8.4* 8.9  CREATININE 1.34* 1.33* 1.06* 1.11*  GFRNONAA 53* 53* >60 >60  GFRAA >60 >60 >60 >60    LIVER FUNCTION TESTS: Recent Labs    09/09/18 1720 05/02/19 0918  BILITOT 0.4 2.1*  AST 14* 19  ALT 10 16  ALKPHOS 64 63  PROT 7.7 8.2*  ALBUMIN 3.9 2.8*    Assessment:  Drain removal without complication  Signed: Lavonia Drafts, PA-C 05/24/2019, 1:14 PM   Please refer to Dr. Kathlene Cote attestation of this note for management and plan.

## 2019-05-25 ENCOUNTER — Telehealth: Payer: Self-pay | Admitting: Internal Medicine

## 2019-05-25 DIAGNOSIS — R5381 Other malaise: Secondary | ICD-10-CM

## 2019-05-25 NOTE — Telephone Encounter (Signed)
Patient referred to outpatient PT, however diagnosis associated was SBO/ovarian abscess. Referral denied.   She was recently hospitalized for aforementioned condition and finds herself physically deconditioned. The referral to outpatient PT was put in 8/5 for physical deconditioning.    Tamsen Snider, MD PGY1  [phone]

## 2019-06-07 ENCOUNTER — Encounter: Payer: Self-pay | Admitting: Physical Therapy

## 2019-06-07 ENCOUNTER — Other Ambulatory Visit: Payer: Self-pay

## 2019-06-07 ENCOUNTER — Ambulatory Visit
Payer: BC Managed Care – PPO | Attending: Student in an Organized Health Care Education/Training Program | Admitting: Physical Therapy

## 2019-06-07 DIAGNOSIS — M6281 Muscle weakness (generalized): Secondary | ICD-10-CM | POA: Diagnosis present

## 2019-06-07 DIAGNOSIS — R262 Difficulty in walking, not elsewhere classified: Secondary | ICD-10-CM | POA: Insufficient documentation

## 2019-06-07 NOTE — Therapy (Signed)
[place], [place], [address] Phone: [phone]   Fax:  [phone]  Physical Therapy Evaluation  Patient Details  Name: Gwendolyn Nguyen MRN: [id number] Date of Birth: 1985-10-30 Referring Provider (PT): Axel Filler, MD   Encounter Date: 06/07/2019  PT End of Session - 06/07/19 1639    Visit Number  1    Number of Visits  21    Date for PT Re-Evaluation  08/19/19    Authorization Type  [employer]    PT Start Time  1633    PT Stop Time  1710    PT Time Calculation (min)  37 min    Activity Tolerance  Patient tolerated treatment well    Behavior During Therapy  [facility] for tasks assessed/performed       Past Medical History:  Diagnosis Date  . Anemia   . Back pain   . Constipation   . Endometriosis   . IBS (irritable bowel syndrome)   . Low hemoglobin   . Multiple food allergies     Past Surgical History:  Procedure Laterality Date  . IR RADIOLOGIST EVAL & MGMT  05/19/2019  . IR RADIOLOGIST EVAL & MGMT  05/24/2019    There were no vitals filed for this visit.   Subjective Assessment - 06/07/19 1639    Subjective  Was admitted to hospital on 7/13 for multiple medical issues and was there for 2 weeks. I would like to be able to workout again. I tried to pick up a jug of water and take trash cans to street which was a struggle. Prior was doing a regular workout routine- 50 sit ups, 10 push ups, short runs. Using a cane for longer distances because my legs feel weak.    How long can you walk comfortably?  2 minutes    Patient Stated Goals  sleep, workout, daily activities    Currently in Pain?  Yes    Pain Location  Back    Pain Orientation  Upper;Mid;Lower    Pain Descriptors / Indicators  Sore    Aggravating Factors   long distance    Pain Relieving Factors  rest         [facility] PT Assessment - 06/07/19 0001      Assessment   Medical Diagnosis  physical deconditioning    Referring  Provider (PT)  Axel Filler, MD    Onset Date/Surgical Date  05/02/19    Hand Dominance  Right    Next MD Visit  9/9    Prior Therapy  no      Precautions   Precautions  Fall      Restrictions   Weight Bearing Restrictions  No      Balance Screen   Has the patient fallen in the past 6 months  No      [place] residence    Living Arrangements  Alone    Additional Comments  stairs      Prior Function   Level of Independence  Independent    Vocation  Full time employment    Vocation Requirements  counseling      Cognition   Overall Cognitive Status  Within Functional Limits for tasks assessed      Sensation   Additional Comments  [facility]      Posture/Postural Control   Posture Comments  rounded shoulders      Special Tests   Other special tests  5TSTS 28s      Ambulation/Gait   Gait Comments  cane Rt hand      6 minute walk test results    Aerobic Endurance Distance Walked  615    Endurance additional comments  short of breath and began dragging feet, did not take a rest break                Objective measurements completed on examination: See above findings.              PT Education - 06/07/19 1724    Education Details  anatomy of condition, POC, HEP, exercise form/rationale    Person(s) Educated  Patient    Methods  Explanation    Comprehension  Verbalized understanding;Need further instruction       PT Short Term Goals - 06/07/19 1720      PT SHORT TERM GOAL #1   Title  Pt will demo at least 1200 ft for 6MWT    Baseline  655ft at eval    Time  5    Period  Weeks    Status  New    [place] Date  07/15/19      PT SHORT TERM GOAL #2   Title  able to demo SLS at least 5s each side    Baseline  unable at eval    Time  5    Period  Weeks    Status  New    Target Date  07/15/19      PT SHORT TERM GOAL #3   Title  Pt will demo good form with sit<>stand    Baseline  wide stance with hinge  forward to shift weight    Time  5    Period  Weeks    Status  New    Target Date  07/15/19        PT Long Term Goals - 06/07/19 1715      PT LONG TERM GOAL #1   Title  pt will be able to navigate her stairs step over step wihtout difficulty    Baseline  severe difficulty at eval    Time  10    Period  Weeks    Status  New    Target Date  08/19/19      PT LONG TERM GOAL #2   Title  6MWT at least 2000 ft    Baseline  615 ft at eval    Time  10    Period  Weeks    Status  New    Target Date  08/19/19      PT LONG TERM GOAL #3   Title  5TSTS <10s    Baseline  28s at eval    Time  10    Period  Weeks    Status  New    Target Date  08/19/19      PT LONG TERM GOAL #4   Title  Pt will return to challenging HEP that will be carried on to long term exercise program    Baseline  unable at eval    Time  10    Period  Weeks    Status  New    Target Date  08/19/19      PT LONG TERM GOAL #5   Title  pt will be able to complete all household chores without limitaiton by fatigue or strength    Baseline  brother just left yesterday and he was doing everything  Time  10    Period  Weeks    Status  New    Target Date  08/19/19             Plan - 06/07/19 1711    Clinical Impression Statement  Pt presents to PT with complaints of general deconditioning following a 2 week stay in the hospital due to infection. Pt was very active prior to hospitalization and is now very limited in strength and endurance. Drags her feet when fatigued and did not ambulate in a straight line due to poor balance control. Will benefit from skilled PT in order to progress conditioning and reach functional goals.    Personal Factors and Comorbidities  Comorbidity 1    Comorbidities  multiple internal processes affected by infection, low hemoglobin    Examination-Activity Limitations  Bathing;Locomotion Level;Bed Mobility;Bend;Sit;Carry;Sleep;Squat;Dressing;Stairs;Stand;Lift     Examination-Participation Restrictions  Meal Prep;Cleaning;Community Activity;Laundry    Stability/Clinical Decision Making  Stable/Uncomplicated    Clinical Decision Making  Low    Rehab Potential  Good    PT Frequency  2x / week    PT Duration  Other (comment)   10 weeks   PT Treatment/Interventions  ADLs/Self Care Home Management;Cryotherapy;Electrical Stimulation;Ultrasound;Moist Heat;Iontophoresis 4mg /ml Dexamethasone;Traction;Gait training;Stair training;Functional mobility training;Therapeutic activities;Balance training;Therapeutic exercise;Patient/family education;Neuromuscular re-education;Manual techniques;Passive range of motion;Dry needling;Spinal Manipulations;Taping    PT Next Visit Plan  core/proximal strengthening    PT Home Exercise Plan  move during TV commercials    Consulted and Agree with Plan of Care  Patient       Patient will benefit from skilled therapeutic intervention in order to improve the following deficits and impairments:  Difficulty walking, Decreased endurance, Decreased activity tolerance, Pain, Improper body mechanics, Decreased balance, Decreased strength, Postural dysfunction  Visit Diagnosis: 1. Difficulty in walking, not elsewhere classified   2. Muscle weakness (generalized)        Problem List Patient Active Problem List   Diagnosis Date Noted  . Physical deconditioning 05/14/2019  . PID (pelvic inflammatory disease) 05/14/2019  . Small bowel obstruction ([facility])   . Pelvic mass 05/02/2019  . Elevated glucose 11/29/2018  . Class 1 obesity with serious comorbidity and body mass index (BMI) of 30.0 to 30.9 in adult 11/29/2018    Andrw Mcguirt C. Cattleya Dobratz PT, DPT 06/07/19 5:26 PM   [facility] [address] [place], [place], [address] Phone: [phone]   Fax:  [phone]  Name: Gwendolyn Nguyen MRN: [id number] Date of Birth: 1986-01-02

## 2019-06-08 ENCOUNTER — Encounter: Payer: Self-pay | Admitting: Physical Therapy

## 2019-06-08 ENCOUNTER — Ambulatory Visit: Payer: BC Managed Care – PPO | Admitting: Physical Therapy

## 2019-06-08 DIAGNOSIS — R262 Difficulty in walking, not elsewhere classified: Secondary | ICD-10-CM

## 2019-06-08 DIAGNOSIS — M6281 Muscle weakness (generalized): Secondary | ICD-10-CM

## 2019-06-08 NOTE — Therapy (Signed)
[place], [place], [address] Phone: [phone]   Fax:  [phone]  Physical Therapy Treatment  Patient Details  Name: Gwendolyn Nguyen MRN: [id number] Date of Birth: 05/04/1986 Referring Provider (PT): Axel Filler, MD   Encounter Date: 06/08/2019  PT End of Session - 06/08/19 1633    Visit Number  2    Number of Visits  21    Date for PT Re-Evaluation  08/19/19    Authorization Type  [employer]    PT Start Time  1630    PT Stop Time  1710    PT Time Calculation (min)  40 min    Activity Tolerance  Patient tolerated treatment well    Behavior During Therapy  [facility] for tasks assessed/performed       Past Medical History:  Diagnosis Date  . Anemia   . Back pain   . Constipation   . Endometriosis   . IBS (irritable bowel syndrome)   . Low hemoglobin   . Multiple food allergies     Past Surgical History:  Procedure Laterality Date  . IR RADIOLOGIST EVAL & MGMT  05/19/2019  . IR RADIOLOGIST EVAL & MGMT  05/24/2019    There were no vitals filed for this visit.  Subjective Assessment - 06/08/19 1632    Subjective  upper back is bothering me.                       [place] Adult PT Treatment/Exercise - 06/08/19 0001      Exercises   Exercises  Lumbar      Lumbar Exercises: Stretches   Piriformis Stretch  Right;Left;30 seconds      Lumbar Exercises: Aerobic   Recumbent Bike  5 min L1 end of session    Nustep  5 min L2 Ue & LE      Lumbar Exercises: Standing   Other Standing Lumbar Exercises  2 large laps around building      Lumbar Exercises: Supine   AB Set Limitations  transverse abd engagement    Bent Knee Raise Limitations  marching with transverse abd engagement      Lumbar Exercises: Sidelying   Clam  Both;20 reps    Other Sidelying Lumbar Exercises  reverse clam x20 each               PT Short Term Goals - 06/07/19 1720      PT SHORT TERM GOAL #1    Title  Pt will demo at least 1200 ft for 6MWT    Baseline  643ft at eval    Time  5    Period  Weeks    Status  New    Target Date  07/15/19      PT SHORT TERM GOAL #2   Title  able to demo SLS at least 5s each side    Baseline  unable at eval    Time  5    Period  Weeks    Status  New    Target Date  07/15/19      PT SHORT TERM GOAL #3   Title  Pt will demo good form with sit<>stand    Baseline  wide stance with hinge forward to shift weight    Time  5    Period  Weeks    Status  New    Target Date  07/15/19        PT Long  Term Goals - 06/07/19 1715      PT LONG TERM GOAL #1   Title  pt will be able to navigate her stairs step over step wihtout difficulty    Baseline  severe difficulty at eval    Time  10    Period  Weeks    Status  New    Target Date  08/19/19      PT LONG TERM GOAL #2   Title  6MWT at least 2000 ft    Baseline  615 ft at eval    Time  10    Period  Weeks    Status  New    Target Date  08/19/19      PT LONG TERM GOAL #3   Title  5TSTS <10s    Baseline  28s at eval    Time  10    Period  Weeks    Status  New    Target Date  08/19/19      PT LONG TERM GOAL #4   Title  Pt will return to challenging HEP that will be carried on to long term exercise program    Baseline  unable at eval    Time  10    Period  Weeks    Status  New    Target Date  08/19/19      PT LONG TERM GOAL #5   Title  pt will be able to complete all household chores without limitaiton by fatigue or strength    Baseline  brother just left yesterday and he was doing everything    Time  10    Period  Weeks    Status  New    Target Date  08/19/19            Plan - 06/08/19 1705    Clinical Impression Statement  Began and ended with cardio challenge and did a walk in the middle of exercises. Reports 2 laps was easy so we will increase it next time. Difficulty engaging core, cues required to reduce upper trapactivation and cues to breathe. Left foot tends to move  to a flat foot strike rather than heel-toe when she fatigues.    PT Treatment/Interventions  ADLs/Self Care Home Management;Cryotherapy;Electrical Stimulation;Ultrasound;Moist Heat;Iontophoresis 4mg /ml Dexamethasone;Traction;Gait training;Stair training;Functional mobility training;Therapeutic activities;Balance training;Therapeutic exercise;Patient/family education;Neuromuscular re-education;Manual techniques;Passive range of motion;Dry needling;Spinal Manipulations;Taping    PT Next Visit Plan  core/proximal strengthening, balance    PT Home Exercise Plan  move during TV commercials, TrA, marching, bridge with ball, clam & reverse    Consulted and Agree with Plan of Care  Patient       Patient will benefit from skilled therapeutic intervention in order to improve the following deficits and impairments:  Difficulty walking, Decreased endurance, Decreased activity tolerance, Pain, Improper body mechanics, Decreased balance, Decreased strength, Postural dysfunction  Visit Diagnosis: 1. Difficulty in walking, not elsewhere classified   2. Muscle weakness (generalized)        Problem List Patient Active Problem List   Diagnosis Date Noted  . Physical deconditioning 05/14/2019  . PID (pelvic inflammatory disease) 05/14/2019  . Small bowel obstruction ([place])   . Pelvic mass 05/02/2019  . Elevated glucose 11/29/2018  . Class 1 obesity with serious comorbidity and body mass index (BMI) of 30.0 to 30.9 in adult 11/29/2018   Avneet Ashmore C. Lorieann Argueta PT, DPT 06/08/19 5:13 PM   [facility] [address] [place], [place], [address] Phone: [phone]  Fax:  [phone]  Name: Gwendolyn Nguyen MRN: [id number] Date of Birth: 05-May-1986

## 2019-06-09 ENCOUNTER — Other Ambulatory Visit: Payer: Self-pay

## 2019-06-09 ENCOUNTER — Encounter: Payer: Self-pay | Admitting: Critical Care Medicine

## 2019-06-09 ENCOUNTER — Telehealth: Payer: Self-pay | Admitting: Critical Care Medicine

## 2019-06-09 ENCOUNTER — Ambulatory Visit (INDEPENDENT_AMBULATORY_CARE_PROVIDER_SITE_OTHER): Payer: BC Managed Care – PPO | Admitting: Critical Care Medicine

## 2019-06-09 VITALS — BP 120/74 | HR 92 | Temp 97.3°F | Ht 64.0 in | Wt 175.0 lb

## 2019-06-09 DIAGNOSIS — D649 Anemia, unspecified: Secondary | ICD-10-CM

## 2019-06-09 DIAGNOSIS — R079 Chest pain, unspecified: Secondary | ICD-10-CM

## 2019-06-09 DIAGNOSIS — R490 Dysphonia: Secondary | ICD-10-CM | POA: Diagnosis not present

## 2019-06-09 DIAGNOSIS — R0781 Pleurodynia: Secondary | ICD-10-CM

## 2019-06-09 LAB — BASIC METABOLIC PANEL
BUN: 17 mg/dL (ref 6–23)
CO2: 24 mEq/L (ref 19–32)
Calcium: 9.9 mg/dL (ref 8.4–10.5)
Chloride: 105 mEq/L (ref 96–112)
Creatinine, Ser: 1 mg/dL (ref 0.40–1.20)
GFR: 78.11 mL/min (ref >60.00)
Glucose, Bld: 78 mg/dL (ref 70–99)
Potassium: 3.9 mEq/L (ref 3.5–5.1)
Sodium: 138 mEq/L (ref 135–145)

## 2019-06-09 LAB — IBC + FERRITIN
Ferritin: 512.2 ng/mL — ABNORMAL HIGH (ref 10.0–291.0)
Iron: 91 ug/dL (ref 42–145)
Saturation Ratios: 31 % (ref 20.0–50.0)
Transferrin: 210 mg/dL — ABNORMAL LOW (ref 212.0–360.0)

## 2019-06-09 LAB — IRON: Iron: 91 ug/dL (ref 42–145)

## 2019-06-09 NOTE — Addendum Note (Signed)
Addended by: Valerie Salts on: 06/09/2019 10:33 AM   Modules accepted: Orders

## 2019-06-09 NOTE — Telephone Encounter (Signed)
Per Dr. Carlis Abbott: Her chart says she is allergic to gadolinium, and it reports her hives. Gadolineum is an MRI contrast, not a CT contrast. Maybe it is in her chart incorrectly? Sorry I didn't know about this earlier. We should clarify so that we know what to fix in her chart. If it was CT contrast, then we should switch it to a non-con CT. Thanks!      LMTCB for pt.

## 2019-06-09 NOTE — Progress Notes (Signed)
   Subjective:    Patient ID: Gwendolyn Nguyen, female    DOB: Dec 10, 1985, 33 y.o.   MRN: [id number]  HPI    Review of Systems  Constitutional: Negative for fever and unexpected weight change.  HENT: Negative for congestion, dental problem, ear pain, nosebleeds, postnasal drip, rhinorrhea, sinus pressure, sneezing, sore throat and trouble swallowing.   Eyes: Negative for redness and itching.  Respiratory: Positive for chest tightness and shortness of breath. Negative for cough and wheezing.   Cardiovascular: Negative for palpitations and leg swelling.  Gastrointestinal: Positive for abdominal pain. Negative for nausea and vomiting.  Genitourinary: Negative for dysuria.  Musculoskeletal: Negative for joint swelling.  Skin: Negative for rash.  Allergic/Immunologic: Negative.  Negative for environmental allergies, food allergies and immunocompromised state.  Neurological: Negative for headaches.  Hematological: Does not bruise/bleed easily.  Psychiatric/Behavioral: Negative for dysphoric mood. The patient is not nervous/anxious.        Objective:   Physical Exam        Assessment & Plan:

## 2019-06-09 NOTE — Telephone Encounter (Signed)
Call returned to patient, she reports she is allergist to contrast dye, she broke out in extreme hives. She said a couple months ago one of her physicians needed her to get a scan but it did not end well with the hives all over her body. I made her aware I would let Dr. Carlis Abbott know. Voiced understanding.   LC please advise, patient is allergic to contract dye. CT currently scheduled for 06/24/2019 @ 4:00pm.

## 2019-06-09 NOTE — Progress Notes (Signed)
Synopsis: Referred in August 2020 for evaluation of dyspnea by Dr. Garwin Brothers.  Subjective:   PATIENT ID: Gwendolyn Nguyen GENDER: female DOB: June 05, 1986, MRN: [id number]  Chief Complaint  Patient presents with  . Consult    Referred by Dr. Garwin Brothers for sharp chest pain after coughing and sneezing. States it feels like someone is pulling on her right lung.  Also for hoarness after having her NG tube removed.     Gwendolyn Nguyen is a 33 year old woman referred for evaluation of evaluation of an unusual sensation in her right chest for 6 months.  Her past medical history is notable for small bowel obstruction and tubo-ovarian abscess last month related to PID, endometriosis, and chronic anemia.  She underwent percutaneous drainage of the pelvic abscess on 05/10/2019 with the drain removed on 05/24/2019.  She was treated with a course of metronidazole as an outpatient.  On 05/16/2019 she underwent a blood transfusion due to low hemoglobin prior to an interventional radiology procedure. Her baseline hemoglobin appears to be 8-10 mg/dL.  She has not been clinic complaining of dyspnea.  She presents for evaluation of right-sided anterior and posterior chest discomfort.  She describes it as sharp pain when sneezing, but has a pulling sensation with normal breathing.  This began during her week of vomiting prior to presenting to the hospital with her bowel obstruction.  At that time she attributed it to pulling a muscle, but is concerned that it is persisted.  Over time it has not significantly changed, and she has never had this before.  She denies a personal and family history of blood clots, lung disease, rheumatologic disease, and sickle cell disease. She does not smoke or vape.  She has been anemic since starting menstruating as a teenager, and she denies anemia or exercise intolerance as a child.  She is never had a hemoglobin electrophoresis performed.   Since she had an NG tube placed while hospitalized she has  had hoarseness that has slowly been improving.  Her voice is now stronger, but goes "out" after talking for a while.  She does not report that the insertion of the NG tube was difficult or traumatic.  She has not undergone any other instrumentation of her upper airway.    Past Medical History:  Diagnosis Date  . Anemia   . Back pain   . Constipation   . Endometriosis   . IBS (irritable bowel syndrome)   . Low hemoglobin   . Multiple food allergies   . PID (acute pelvic inflammatory disease) 26/2035   complicated by tuboovarian abscess     Family History  Problem Relation Age of Onset  . Hyperlipidemia Mother   . Hypertension Mother   . Obesity Mother   . Hyperlipidemia Father   . Hypertension Father   . Obesity Father   . Diabetes Maternal Grandmother      Past Surgical History:  Procedure Laterality Date  . IR RADIOLOGIST EVAL & MGMT  05/19/2019  . IR RADIOLOGIST EVAL & MGMT  05/24/2019    Social History   Socioeconomic History  . Marital status: Single    Spouse name: Not on file  . Number of children: Not on file  . Years of education: Not on file  . Highest education level: Not on file  Occupational History  . Occupation: Social worker  Social Needs  . Financial resource strain: Not on file  . Food insecurity    Worry: Not on file    Inability: Not on  file  . Transportation needs    Medical: Not on file    Non-medical: Not on file  Tobacco Use  . Smoking status: Never Smoker  . Smokeless tobacco: Never Used  Substance and Sexual Activity  . Alcohol use: Yes    Comment: occassional  . Drug use: Not on file  . Sexual activity: Not on file  Lifestyle  . Physical activity    Days per week: Not on file    Minutes per session: Not on file  . Stress: Not on file  Relationships  . Social Herbalist on phone: Not on file    Gets together: Not on file    Attends religious service: Not on file    Active member of club or organization: Not on file     Attends meetings of clubs or organizations: Not on file    Relationship status: Not on file  . Intimate partner violence    Fear of current or ex partner: Not on file    Emotionally abused: Not on file    Physically abused: Not on file    Forced sexual activity: Not on file  Other Topics Concern  . Not on file  Social History Narrative  . Not on file     Allergies  Allergen Reactions  . Gadolinium Derivatives Hives, Itching and Nausea And Vomiting    Pt vomited immediately during injection.  15 minutes later, pt developed hives and itching.   . Shellfish Allergy Hives  . Sulfa Antibiotics Other (See Comments)    Patient was told to NOT TAKE THIS      There is no immunization history on file for this patient.  Outpatient Medications Prior to Visit  Medication Sig Dispense Refill  . estradiol cypionate (DEPO-ESTRADIOL) 5 MG/ML injection Inject into the muscle every 28 (twenty-eight) days.    . ferrous sulfate 325 (65 FE) MG tablet Take 325 mg by mouth daily with breakfast.    . Black Currant Seed Oil 500 MG CAPS Take 1 capsule by mouth daily.    . metroNIDAZOLE (FLAGYL) 500 MG tablet Take 1 tablet (500 mg total) by mouth 2 (two) times daily. (Patient not taking: Reported on 06/07/2019) 5 tablet 0  . ondansetron (ZOFRAN) 4 MG tablet Take 1 tablet (4 mg total) by mouth every 8 (eight) hours as needed for nausea. (Patient not taking: Reported on 06/07/2019) 30 tablet 0  . Peppermint Oil (IBGARD PO) Take 1 capsule by mouth daily.    Marland Kitchen senna-docusate (SENOKOT-S) 8.6-50 MG tablet Take 2 tablets by mouth 2 (two) times daily. (Patient not taking: Reported on 06/07/2019) 30 tablet 0  . sodium chloride flush (NS) 0.9 % SOLN 10 ml into drain daily (Patient not taking: Reported on 06/07/2019) 10 Syringe 0  . Urelle (URELLE/URISED) 81 MG TABS tablet Take 1 tablet (81 mg total) by mouth 4 (four) times daily. (Patient not taking: Reported on 06/07/2019) 120 tablet 0  . vitamin E 1000 UNIT capsule Take  1,000 Units by mouth daily.     No facility-administered medications prior to visit.     Review of Systems  Constitutional: Negative for chills and fever.  HENT:       Hoarse voice  Respiratory: Negative for cough, hemoptysis, sputum production, shortness of breath and wheezing.   Cardiovascular: Positive for chest pain. Negative for leg swelling.  Gastrointestinal: Negative for nausea and vomiting.  Genitourinary: Negative.   Musculoskeletal: Negative for joint pain and myalgias.  Skin:  Negative for rash.  Neurological:       Weakness since acute illness last month  Endo/Heme/Allergies: Does not bruise/bleed easily.  Psychiatric/Behavioral: Negative.      Objective:   Vitals:   06/09/19 0859  BP: 120/74  Pulse: 92  Temp: (!) 97.3 F (36.3 C)  TempSrc: Oral  SpO2: 96%  Weight: 175 lb (79.4 kg)  Height: 5\' 4"  (1.626 m)   96% on RA BMI Readings from Last 3 Encounters:  06/09/19 30.04 kg/m  05/03/19 33.13 kg/m  12/14/18 30.04 kg/m   Wt Readings from Last 3 Encounters:  06/09/19 175 lb (79.4 kg)  05/03/19 193 lb (87.5 kg)  12/14/18 175 lb (79.4 kg)    Physical Exam Constitutional:      Appearance: Normal appearance.     Comments: Ambulates with a cane.  HENT:     Head: Normocephalic and atraumatic.     Nose: No rhinorrhea.     Mouth/Throat:     Comments: Oral exam deferred due to masking requirements Eyes:     General: No scleral icterus. Neck:     Musculoskeletal: Neck supple.     Comments: Bilateral goiter Cardiovascular:     Rate and Rhythm: Normal rate and regular rhythm.     Heart sounds: No murmur. No friction rub.  Pulmonary:     Comments: Breathing comfortably on room air.  Normal speech.  Intermittently jumps due to breathing related discomfort.  Clear to auscultation bilaterally-no pleural rubs or rhonchi. Abdominal:     General: Abdomen is flat.     Palpations: Abdomen is soft.     Tenderness: There is no abdominal tenderness.      Comments: Small scar in right lower quadrant from recent drain placement, appears to be healing well.  Musculoskeletal:        General: No swelling, tenderness or deformity.  Lymphadenopathy:     Cervical: No cervical adenopathy.  Skin:    General: Skin is warm and dry.     Findings: No rash.  Neurological:     Mental Status: She is alert.     Comments: Globally weak, but no focal abnormalities.      CBC    Component Value Date/Time   WBC 9.7 05/14/2019 0412   RBC 3.42 (L) 05/14/2019 0412   HGB 8.8 (L) 05/14/2019 0412   HCT 27.5 (L) 05/14/2019 0412   PLT 648 (H) 05/14/2019 0412   MCV 80.4 05/14/2019 0412   MCH 25.7 (L) 05/14/2019 0412   MCHC 32.0 05/14/2019 0412   RDW 20.7 (H) 05/14/2019 0412   LYMPHSABS 2.2 05/07/2019 0445   MONOABS 1.2 (H) 05/07/2019 0445   EOSABS 0.2 05/07/2019 0445   BASOSABS 0.1 05/07/2019 0445    Chest Imaging- films reviewed: CT scans of abdomen and pelvis performed in July reviewed, specifically the lower lobes available on imaging.  There are no pleural effusions or obvious parenchymal pathology.  Pulmonary Functions Testing Results: No flowsheet data found.      Assessment & Plan:     ICD-10-CM   1. Chest pain, pleuritic  R07.81 CT Chest W Contrast  2. Normocytic anemia  H37.1 Basic Metabolic Panel (BMET)    IBC + Ferritin    Iron    Hemoglobinopathy Evaluation    Hemoglobinopathy Evaluation    Iron    IBC + Ferritin    Basic Metabolic Panel (BMET)    CANCELED: Hemoglobinopathy evaluation    CANCELED: IBC + Ferritin    CANCELED: Iron  CANCELED: Basic Metabolic Panel (BMET)  3. Chest pain, unspecified type  R07.9 CT Chest W Contrast  4. Hoarseness  R49.0 Consult to ear nose and throat   Atypical chest pain, sounds similar to pleuritic chest pain.  Given that this began when she was significantly dehydrated and acutely ill, I am concerned she may have had a small PE. - CT scan with contrast.  BMP ordered to evaluate renal  function. -Hemoglobin electrophoresis given her history of anemia and the concern for hypercoagulability in a low flow state.  Iron deficiency anemia. Unclear if there are other causes contributing to her anemia. -Hemoglobin electrophoresis -Iron studies  Hoarness following NG tube insertion.  I suspect she may have vocal cord abnormalities. -ENT referral   Current Outpatient Medications:  .  estradiol cypionate (DEPO-ESTRADIOL) 5 MG/ML injection, Inject into the muscle every 28 (twenty-eight) days., Disp: , Rfl:  .  ferrous sulfate 325 (65 FE) MG tablet, Take 325 mg by mouth daily with breakfast., Disp: , Rfl:   Return to clinic for follow-up following CT scan in 2 to 3 weeks.   Julian Hy, DO [facility] 06/09/2019 9:55 AM

## 2019-06-09 NOTE — Patient Instructions (Addendum)
Thank you for visiting Dr. Carlis Abbott at [facility]. We recommend the following: Orders Placed This Encounter  Procedures  . CT Chest W Contrast  . Hemoglobinopathy evaluation  . IBC + Ferritin  . Iron  . Basic Metabolic Panel (BMET)   Orders Placed This Encounter  Procedures  . CT Chest W Contrast    CT chest in about 2 weeks    Standing Status:   Future    Standing Expiration Date:   08/08/2020    Order Specific Question:   ** REASON FOR EXAM (FREE TEXT)    Answer:   pain with breathing, sneezing on right since before PID last month    Order Specific Question:   If indicated for the ordered procedure, I authorize the administration of contrast media per Radiology protocol    Answer:   Yes    Order Specific Question:   Is patient pregnant?    Answer:   Unknown (Please Explain)    Order Specific Question:   Preferred imaging location?    Answer:   [facility]    Order Specific Question:   Radiology Contrast Protocol - do NOT remove file path    Answer:   [email or web address]  . Basic Metabolic Panel (BMET)    Standing Status:   Future    Standing Expiration Date:   06/08/2020  . IBC + Ferritin    Standing Status:   Future    Standing Expiration Date:   06/08/2020  . Iron    Standing Status:   Future    Standing Expiration Date:   06/08/2020  . Hemoglobinopathy Evaluation    Standing Status:   Future    Standing Expiration Date:   06/08/2020    No orders of the defined types were placed in this encounter.   Return in about 2 weeks (around 06/23/2019).    Please do your part to reduce the spread of COVID-19.

## 2019-06-13 LAB — HEMOGLOBINOPATHY EVALUATION
Fetal Hemoglobin Testing: <1 % (ref 0.0–1.9)
HCT: 37.7 % (ref 35.0–45.0)
Hemoglobin A2 - HGBRFX: 2 % (ref 1.8–3.5)
Hemoglobin: 11.7 g/dL (ref 11.7–15.5)
Hgb A: 97 % (ref >96.0)
MCH: 26 pg — ABNORMAL LOW (ref 27.0–33.0)
MCV: 83.8 fL (ref 80.0–100.0)
RBC: 4.5 10*6/uL (ref 3.80–5.10)
RDW: 18.4 % — ABNORMAL HIGH (ref 11.0–15.0)

## 2019-06-13 NOTE — Telephone Encounter (Signed)
Patient is returning phone call.  Patient phone number is [phone].

## 2019-06-13 NOTE — Telephone Encounter (Signed)
Left message for patient to call back  

## 2019-06-14 ENCOUNTER — Telehealth: Payer: Self-pay | Admitting: Critical Care Medicine

## 2019-06-14 NOTE — Telephone Encounter (Signed)
Noted Will sign off 

## 2019-06-14 NOTE — Telephone Encounter (Addendum)
Called and spoke to pt. Informed her of the recs per Dr. Carlis Abbott. Pt verbalized understanding and did confirm the allergy was when she had her MRI in February. Pt is ok moving forward with CT with contrast.   Will forward to Dr. Carlis Abbott as an Juluis Rainier.

## 2019-06-14 NOTE — Telephone Encounter (Signed)
Pt  Returning call and can be reached @ [phone].Gwendolyn Nguyen

## 2019-06-16 ENCOUNTER — Ambulatory Visit: Payer: BC Managed Care – PPO | Admitting: Physical Therapy

## 2019-06-17 ENCOUNTER — Ambulatory Visit: Payer: BC Managed Care – PPO | Admitting: Physical Therapy

## 2019-06-17 ENCOUNTER — Encounter: Payer: Self-pay | Admitting: Physical Therapy

## 2019-06-17 ENCOUNTER — Other Ambulatory Visit: Payer: Self-pay

## 2019-06-17 DIAGNOSIS — M6281 Muscle weakness (generalized): Secondary | ICD-10-CM

## 2019-06-17 DIAGNOSIS — R262 Difficulty in walking, not elsewhere classified: Secondary | ICD-10-CM | POA: Diagnosis not present

## 2019-06-17 NOTE — Therapy (Signed)
[place], [place], [address] Phone: [phone]   Fax:  [phone]  Physical Therapy Treatment  Patient Details  Name: Gwendolyn Nguyen MRN: [id number] Date of Birth: Oct 24, 1985 Referring Provider (PT): Axel Filler, MD   Encounter Date: 06/17/2019  PT End of Session - 06/17/19 1145    Visit Number  3    Number of Visits  21    Date for PT Re-Evaluation  08/19/19    Authorization Type  BCBS    PT Start Time  [id number]    PT Stop Time  1223    PT Time Calculation (min)  38 min    Activity Tolerance  Patient tolerated treatment well    Behavior During Therapy  [facility] for tasks assessed/performed       Past Medical History:  Diagnosis Date  . Anemia   . Back pain   . Constipation   . Endometriosis   . IBS (irritable bowel syndrome)   . Low hemoglobin   . Multiple food allergies   . PID (acute pelvic inflammatory disease) 99991111   complicated by tuboovarian abscess    Past Surgical History:  Procedure Laterality Date  . IR RADIOLOGIST EVAL & MGMT  05/19/2019  . IR RADIOLOGIST EVAL & MGMT  05/24/2019    There were no vitals filed for this visit.  Subjective Assessment - 06/17/19 1146    Subjective  Upper back is still hurting, ordered a ball and got a standing station for work.    Currently in Pain?  Yes    Pain Score  4     Pain Location  Back    Pain Orientation  Upper    Pain Descriptors / Indicators  Dull    Pain Relieving Factors  aleve                       [facility] Adult PT Treatment/Exercise - 06/17/19 0001      Lumbar Exercises: Aerobic   Recumbent Bike  5 min L1 after long walk, beg of treatment    Nustep  5 min L5 UE & LE end of session      Lumbar Exercises: Standing   Other Standing Lumbar Exercises  3 large laps    Other Standing Lumbar Exercises  SLS at counter      Lumbar Exercises: Seated   Other Seated Lumbar Exercises  anchored resist& seated on  physioball: row double & single, flexion, extension, ER, reverse fly    Other Seated Lumbar Exercises  physioball bouncing & pelvic clocks, NBOS balance               PT Short Term Goals - 06/07/19 1720      PT SHORT TERM GOAL #1   Title  Pt will demo at least 1200 ft for 6MWT    Baseline  62ft at eval    Time  5    Period  Weeks    Status  New    Target Date  07/15/19      PT SHORT TERM GOAL #2   Title  able to demo SLS at least 5s each side    Baseline  unable at eval    Time  5    Period  Weeks    Status  New    Target Date  07/15/19      PT SHORT TERM GOAL #3   Title  Pt will demo good form with  sit<>stand    Baseline  wide stance with hinge forward to shift weight    Time  5    Period  Weeks    Status  New    Target Date  07/15/19        PT Long Term Goals - 06/07/19 1715      PT LONG TERM GOAL #1   Title  pt will be able to navigate her stairs step over step wihtout difficulty    Baseline  severe difficulty at eval    Time  10    Period  Weeks    Status  New    Target Date  08/19/19      PT LONG TERM GOAL #2   Title  6MWT at least 2000 ft    Baseline  615 ft at eval    Time  10    Period  Weeks    Status  New    Target Date  08/19/19      PT LONG TERM GOAL #3   Title  5TSTS <10s    Baseline  28s at eval    Time  10    Period  Weeks    Status  New    Target Date  08/19/19      PT LONG TERM GOAL #4   Title  Pt will return to challenging HEP that will be carried on to long term exercise program    Baseline  unable at eval    Time  10    Period  Weeks    Status  New    Target Date  08/19/19      PT LONG TERM GOAL #5   Title  pt will be able to complete all household chores without limitaiton by fatigue or strength    Baseline  brother just left yesterday and he was doing everything    Time  10    Period  Weeks    Status  New    Target Date  08/19/19            Plan - 06/17/19 1216    Clinical Impression Statement  Pt  purchased a physioball so upper back/postural strengthening performed while seated on ball. Slowed after 2 laps but did make 3 without rest break.    PT Treatment/Interventions  ADLs/Self Care Home Management;Cryotherapy;Electrical Stimulation;Ultrasound;Moist Heat;Iontophoresis 4mg /ml Dexamethasone;Traction;Gait training;Stair training;Functional mobility training;Therapeutic activities;Balance training;Therapeutic exercise;Patient/family education;Neuromuscular re-education;Manual techniques;Passive range of motion;Dry needling;Spinal Manipulations;Taping    PT Next Visit Plan  balance    PT Home Exercise Plan  move during TV commercials, TrA, marching, bridge with ball, clam & reverse, anchored bands on physioball    Consulted and Agree with Plan of Care  Patient       Patient will benefit from skilled therapeutic intervention in order to improve the following deficits and impairments:  Difficulty walking, Decreased endurance, Decreased activity tolerance, Pain, Improper body mechanics, Decreased balance, Decreased strength, Postural dysfunction  Visit Diagnosis: Difficulty in walking, not elsewhere classified  Muscle weakness (generalized)     Problem List Patient Active Problem List   Diagnosis Date Noted  . Physical deconditioning 05/14/2019  . PID (pelvic inflammatory disease) 05/14/2019  . Small bowel obstruction ([place])   . Pelvic mass 05/02/2019  . Elevated glucose 11/29/2018  . Class 1 obesity with serious comorbidity and body mass index (BMI) of 30.0 to 30.9 in adult 11/29/2018   Terron Merfeld C. Helina Hullum PT, DPT 06/17/19 12:24 PM   [facility] [address] [name], [place], [address] Phone: [phone]   Fax:  [phone]  Name: Dinasia Shroff MRN: [id number] Date of Birth: [birth year]

## 2019-06-22 ENCOUNTER — Ambulatory Visit
Payer: BC Managed Care – PPO | Attending: Student in an Organized Health Care Education/Training Program | Admitting: Physical Therapy

## 2019-06-22 ENCOUNTER — Encounter: Payer: Self-pay | Admitting: Physical Therapy

## 2019-06-22 ENCOUNTER — Other Ambulatory Visit: Payer: Self-pay

## 2019-06-22 ENCOUNTER — Telehealth: Payer: Self-pay

## 2019-06-22 ENCOUNTER — Encounter: Payer: Self-pay | Admitting: Radiology

## 2019-06-22 DIAGNOSIS — R262 Difficulty in walking, not elsewhere classified: Secondary | ICD-10-CM | POA: Diagnosis not present

## 2019-06-22 DIAGNOSIS — M6281 Muscle weakness (generalized): Secondary | ICD-10-CM | POA: Diagnosis present

## 2019-06-22 NOTE — Telephone Encounter (Signed)
13hr prep called in to pt's [facility] in epic. Prednisone 50mg  PO 06/24/19 @ 0300, 0900 and 1500.  Benadryl 50mg  PO 9/4 @ 1500.  LMOM for pt that this was called in and to call us w/ any questions.

## 2019-06-22 NOTE — Therapy (Signed)
[place], [place], [address] Phone: [phone]   Fax:  [phone]  Physical Therapy Treatment  Patient Details  Name: Gwendolyn Nguyen MRN: [id number] Date of Birth: May 11, 1986 Referring Provider (PT): Axel Filler, MD   Encounter Date: 06/22/2019  PT End of Session - 06/22/19 1600    Visit Number  4    Number of Visits  21    Date for PT Re-Evaluation  08/19/19    Authorization Type  BCBS    PT Start Time  1545    PT Stop Time  1627    PT Time Calculation (min)  42 min    Activity Tolerance  Patient tolerated treatment well    Behavior During Therapy  [facility] for tasks assessed/performed       Past Medical History:  Diagnosis Date  . Anemia   . Back pain   . Constipation   . Endometriosis   . IBS (irritable bowel syndrome)   . Low hemoglobin   . Multiple food allergies   . PID (acute pelvic inflammatory disease) 99991111   complicated by tuboovarian abscess    Past Surgical History:  Procedure Laterality Date  . IR RADIOLOGIST EVAL & MGMT  05/19/2019  . IR RADIOLOGIST EVAL & MGMT  05/24/2019    There were no vitals filed for this visit.  Subjective Assessment - 06/22/19 1559    Subjective  Didn't do my exercises for about 2 days and then I needed my stick! I won't do that again. Using my standing station for work.         [facility] PT Assessment - 06/22/19 0001      Ambulation/Gait   Gait Comments  cane Rt hand      6 minute walk test results    Aerobic Endurance Distance Walked  968    Endurance additional comments  no rest break                   [facility] Adult PT Treatment/Exercise - 06/22/19 0001      Lumbar Exercises: Stretches   Gastroc Stretch Limitations  lunge position      Lumbar Exercises: Aerobic   Recumbent Bike  5 min L1 end of session    Nustep  7 min L6 UE & LE      Lumbar Exercises: Standing   Heel Raises  20 reps;10 reps    Heel Raises  Limitations  both, chair for balance    Forward Lunge Limitations  step back lunge to knee drive- balance with knee lifted    Other Standing Lumbar Exercises  SLS with hinge/reach      Lumbar Exercises: Quadruped   Straight Leg Raises Limitations  hip extension, on forearms    Other Quadruped Lumbar Exercises  primal push ups               PT Short Term Goals - 06/07/19 1720      PT SHORT TERM GOAL #1   Title  Pt will demo at least 1200 ft for 6MWT    Baseline  655ft at eval    Time  5    Period  Weeks    Status  New    Target Date  07/15/19      PT SHORT TERM GOAL #2   Title  able to demo SLS at least 5s each side    Baseline  unable at eval    Time  5  Period  Weeks    Status  New    Target Date  07/15/19      PT SHORT TERM GOAL #3   Title  Pt will demo good form with sit<>stand    Baseline  wide stance with hinge forward to shift weight    Time  5    Period  Weeks    Status  New    Target Date  07/15/19        PT Long Term Goals - 06/07/19 1715      PT LONG TERM GOAL #1   Title  pt will be able to navigate her stairs step over step wihtout difficulty    Baseline  severe difficulty at eval    Time  10    Period  Weeks    Status  New    Target Date  08/19/19      PT LONG TERM GOAL #2   Title  6MWT at least 2000 ft    Baseline  615 ft at eval    Time  10    Period  Weeks    Status  New    Target Date  08/19/19      PT LONG TERM GOAL #3   Title  5TSTS <10s    Baseline  28s at eval    Time  10    Period  Weeks    Status  New    Target Date  08/19/19      PT LONG TERM GOAL #4   Title  Pt will return to challenging HEP that will be carried on to long term exercise program    Baseline  unable at eval    Time  10    Period  Weeks    Status  New    Target Date  08/19/19      PT LONG TERM GOAL #5   Title  pt will be able to complete all household chores without limitaiton by fatigue or strength    Baseline  brother just left yesterday and  he was doing everything    Time  10    Period  Weeks    Status  New    Target Date  08/19/19            Plan - 06/22/19 1622    Clinical Impression Statement  Significant increase in 6MWT distance today and progressing difficulty of HEP. Heel raises broken into sets of 10 due to fatigue.    PT Treatment/Interventions  ADLs/Self Care Home Management;Cryotherapy;Electrical Stimulation;Ultrasound;Moist Heat;Iontophoresis 4mg /ml Dexamethasone;Traction;Gait training;Stair training;Functional mobility training;Therapeutic activities;Balance training;Therapeutic exercise;Patient/family education;Neuromuscular re-education;Manual techniques;Passive range of motion;Dry needling;Spinal Manipulations;Taping    PT Home Exercise Plan  move during TV commercials, TrA, marching, bridge with ball, clam & reverse, anchored bands on physioball, primal push ups, heel raises    Consulted and Agree with Plan of Care  Patient       Patient will benefit from skilled therapeutic intervention in order to improve the following deficits and impairments:  Difficulty walking, Decreased endurance, Decreased activity tolerance, Pain, Improper body mechanics, Decreased balance, Decreased strength, Postural dysfunction  Visit Diagnosis: Difficulty in walking, not elsewhere classified  Muscle weakness (generalized)     Problem List Patient Active Problem List   Diagnosis Date Noted  . Physical deconditioning 05/14/2019  . PID (pelvic inflammatory disease) 05/14/2019  . Small bowel obstruction ([place])   . Pelvic mass 05/02/2019  . Elevated glucose 11/29/2018  . Class 1 obesity with  serious comorbidity and body mass index (BMI) of 30.0 to 30.9 in adult 11/29/2018   Gwendolyn Nguyen C. Alif Petrak PT, DPT 06/22/19 5:16 PM   [place] [facility] [address] [place], [place], [address] Phone: [phone]   Fax:  [phone]  Name: Gwendolyn Nguyen MRN: [id number] Date of  Birth: 1986/07/06

## 2019-06-23 ENCOUNTER — Ambulatory Visit: Payer: BC Managed Care – PPO | Admitting: Physical Therapy

## 2019-06-23 ENCOUNTER — Encounter: Payer: Self-pay | Admitting: Physical Therapy

## 2019-06-23 DIAGNOSIS — R262 Difficulty in walking, not elsewhere classified: Secondary | ICD-10-CM | POA: Diagnosis not present

## 2019-06-23 DIAGNOSIS — M6281 Muscle weakness (generalized): Secondary | ICD-10-CM

## 2019-06-23 NOTE — Therapy (Signed)
[place], [place], [address] Phone: [phone]   Fax:  [phone]  Physical Therapy Treatment  Patient Details  Name: Gwendolyn Nguyen MRN: [id number] Date of Birth: 1986-04-16 Referring Provider (PT): Axel Filler, MD   Encounter Date: 06/23/2019  PT End of Session - 06/23/19 1623    Visit Number  5    Number of Visits  21    Date for PT Re-Evaluation  08/19/19    Authorization Type  BCBS    PT Start Time  1546    PT Stop Time  1628    PT Time Calculation (min)  42 min    Activity Tolerance  Patient tolerated treatment well    Behavior During Therapy  [facility] for tasks assessed/performed       Past Medical History:  Diagnosis Date  . Anemia   . Back pain   . Constipation   . Endometriosis   . IBS (irritable bowel syndrome)   . Low hemoglobin   . Multiple food allergies   . PID (acute pelvic inflammatory disease) 99991111   complicated by tuboovarian abscess    Past Surgical History:  Procedure Laterality Date  . IR RADIOLOGIST EVAL & MGMT  05/19/2019  . IR RADIOLOGIST EVAL & MGMT  05/24/2019    There were no vitals filed for this visit.  Subjective Assessment - 06/23/19 1552    Subjective  No major soreness after yesterday's visit. Pt. requests another copy of HEP issued yesterday/lost previous copy.    Currently in Pain?  No/denies                       [facility] Adult PT Treatment/Exercise - 06/23/19 0001      Lumbar Exercises: Aerobic   Recumbent Bike  5 min L1 end of session    Nustep  6 min L6 UE/LE      Lumbar Exercises: Standing   Heel Raises  20 reps   2x10   Functional Squats Limitations  KB front squat to touch to high low table 5 lbs. 2x10, also performed TRX squat x 10 reps    Forward Lunge Limitations  step back lunge with TRX x 10 ea. side bilat.    Other Standing Lumbar Exercises  Rockerboard x 1 min ea. dynamic balance fw/rev  and side to side on  blue board, Romberg on Airex feet together 20 sec x 2 ea. eyes open and eyes closed, Airex slow march x 20    Other Standing Lumbar Exercises  pall off press x 15 times ea. way with blue band      Lumbar Exercises: Seated   Other Seated Lumbar Exercises  seated on Physioball: rows blue band 2x10, Theraband ext blue band 2x10             PT Education - 06/23/19 1612    Education Details  HEP, POC    Person(s) Educated  Patient    Methods  Explanation    Comprehension  Verbalized understanding       PT Short Term Goals - 06/07/19 1720      PT SHORT TERM GOAL #1   Title  Pt will demo at least 1200 ft for 6MWT    Baseline  676ft at eval    Time  5    Period  Weeks    Status  New    Target Date  07/15/19      PT SHORT TERM  GOAL #2   Title  able to demo SLS at least 5s each side    Baseline  unable at eval    Time  5    Period  Weeks    Status  New    Target Date  07/15/19      PT SHORT TERM GOAL #3   Title  Pt will demo good form with sit<>stand    Baseline  wide stance with hinge forward to shift weight    Time  5    Period  Weeks    Status  New    Target Date  07/15/19        PT Long Term Goals - 06/07/19 1715      PT LONG TERM GOAL #1   Title  pt will be able to navigate her stairs step over step wihtout difficulty    Baseline  severe difficulty at eval    Time  10    Period  Weeks    Status  New    Target Date  08/19/19      PT LONG TERM GOAL #2   Title  6MWT at least 2000 ft    Baseline  615 ft at eval    Time  10    Period  Weeks    Status  New    Target Date  08/19/19      PT LONG TERM GOAL #3   Title  5TSTS <10s    Baseline  28s at eval    Time  10    Period  Weeks    Status  New    Target Date  08/19/19      PT LONG TERM GOAL #4   Title  Pt will return to challenging HEP that will be carried on to long term exercise program    Baseline  unable at eval    Time  10    Period  Weeks    Status  New    Target Date  08/19/19      PT  LONG TERM GOAL #5   Title  pt will be able to complete all household chores without limitaiton by fatigue or strength    Baseline  brother just left yesterday and he was doing everything    Time  10    Period  Weeks    Status  New    Target Date  08/19/19            Plan - 06/23/19 1559    Clinical Impression Statement  Fatigues easily/still with deconditioning but continues to improve gradually from baseline status with functional activity tolerance. Challenged with stanidng balance exercises today.    Personal Factors and Comorbidities  Comorbidity 1    Comorbidities  multiple internal processes affected by infection, low hemoglobin    Examination-Activity Limitations  Bathing;Locomotion Level;Bed Mobility;Bend;Sit;Carry;Sleep;Squat;Dressing;Stairs;Stand;Lift    Examination-Participation Restrictions  Meal Prep;Cleaning;Community Activity;Laundry    Stability/Clinical Decision Making  Stable/Uncomplicated    Clinical Decision Making  Low    Rehab Potential  Good    PT Frequency  2x / week    PT Duration  --   10 weeks   PT Treatment/Interventions  ADLs/Self Care Home Management;Cryotherapy;Electrical Stimulation;Ultrasound;Moist Heat;Iontophoresis 4mg /ml Dexamethasone;Traction;Gait training;Stair training;Functional mobility training;Therapeutic activities;Balance training;Therapeutic exercise;Patient/family education;Neuromuscular re-education;Manual techniques;Passive range of motion;Dry needling;Spinal Manipulations;Taping    PT Next Visit Plan  continue conditioning/work on functional activity tolerance, strengthening, core work balance training    PT Home Exercise Plan  move  during TV commercials, TrA, marching, bridge with ball, clam & reverse, anchored bands on physioball, primal push ups, heel raises    Consulted and Agree with Plan of Care  Patient       Patient will benefit from skilled therapeutic intervention in order to improve the following deficits and impairments:   Difficulty walking, Decreased endurance, Decreased activity tolerance, Pain, Improper body mechanics, Decreased balance, Decreased strength, Postural dysfunction  Visit Diagnosis: Difficulty in walking, not elsewhere classified  Muscle weakness (generalized)     Problem List Patient Active Problem List   Diagnosis Date Noted  . Physical deconditioning 05/14/2019  . PID (pelvic inflammatory disease) 05/14/2019  . Small bowel obstruction ([place])   . Pelvic mass 05/02/2019  . Elevated glucose 11/29/2018  . Class 1 obesity with serious comorbidity and body mass index (BMI) of 30.0 to 30.9 in adult 11/29/2018   Beaulah Dinning, PT, DPT 06/23/19 4:25 PM  [place] [facility] [address] [place], [place], [address] Phone: [phone]   Fax:  [phone]  Name: Gwendolyn Nguyen MRN: [id number] Date of Birth: May 06, 1986

## 2019-06-24 ENCOUNTER — Ambulatory Visit
Admission: RE | Admit: 2019-06-24 | Discharge: 2019-06-24 | Disposition: A | Discharge status: Home / Self Care | Payer: BC Managed Care – PPO | Source: Ambulatory Visit | Attending: Critical Care Medicine | Admitting: Critical Care Medicine

## 2019-06-24 DIAGNOSIS — R079 Chest pain, unspecified: Secondary | ICD-10-CM

## 2019-06-24 DIAGNOSIS — R0781 Pleurodynia: Secondary | ICD-10-CM

## 2019-06-24 MED ORDER — IOPAMIDOL (ISOVUE-300) INJECTION 61%
75.0000 mL | Freq: Once | INTRAVENOUS | Status: AC | PRN
  Administered 2019-06-24: 75 mL via INTRAVENOUS

## 2019-06-27 ENCOUNTER — Telehealth: Payer: Self-pay | Admitting: Gastroenterology

## 2019-06-27 NOTE — Telephone Encounter (Signed)
Patient of Dr. Lorie Apley. Patient had CT-Chest ordered by Pulmonary for follow up. Has known Shellfish and IV contrast allergy but got premedicated with Steroids/Benadryl per report. Has had GI upset (nausea/vomiting/upset stomach and possible diarrhea) since her CT scan. She called GI this evening because with a loose BM that was diarrhea-like she had blood on toilet paper and in the toilet bowl. Only 1 episode. Not sever. No pre-syncope symptoms. Staying hydrated. No history of hemorrhoids, but saw Dr. Collene Mares years ago for self-resolving episode of bleeding and did not have scope per her report. I have recommended some OTC hemorrhoidal wipes/cream incase this is hemorrhoidal. If diarrhea symptoms persist may benefit from stool studies. If this is a reaction to the IV contrast, then may benefit from Benadryl and she is going to get some and try. She is going to let her Pulmonary MD know as well. I will relay message to Dr. Collene Mares for follow up by her office. If she has severe episodes of recurrent bleeding, then will call back and I'll direct to ED, though seems less likely. Patient appreciative for call-back.  Justice Britain, MD [facility] Office # [phone]

## 2019-06-28 NOTE — Progress Notes (Signed)
Spoke with pt and notified of results per Dr Loletta Specter. Pt verbalized understanding and denied any questions.

## 2019-06-29 ENCOUNTER — Other Ambulatory Visit: Payer: Self-pay

## 2019-06-29 ENCOUNTER — Ambulatory Visit: Payer: BC Managed Care – PPO | Admitting: Physical Therapy

## 2019-06-29 ENCOUNTER — Ambulatory Visit (INDEPENDENT_AMBULATORY_CARE_PROVIDER_SITE_OTHER): Payer: BC Managed Care – PPO | Admitting: Critical Care Medicine

## 2019-06-29 ENCOUNTER — Encounter: Payer: Self-pay | Admitting: Physical Therapy

## 2019-06-29 DIAGNOSIS — D649 Anemia, unspecified: Secondary | ICD-10-CM

## 2019-06-29 DIAGNOSIS — R1030 Lower abdominal pain, unspecified: Secondary | ICD-10-CM

## 2019-06-29 DIAGNOSIS — R0789 Other chest pain: Secondary | ICD-10-CM | POA: Diagnosis not present

## 2019-06-29 DIAGNOSIS — R262 Difficulty in walking, not elsewhere classified: Secondary | ICD-10-CM | POA: Diagnosis not present

## 2019-06-29 DIAGNOSIS — M6281 Muscle weakness (generalized): Secondary | ICD-10-CM

## 2019-06-29 NOTE — Progress Notes (Signed)
Synopsis: Referred in August 2020 for evaluation of dyspnea by Dr. Garwin Brothers.  Subjective:   PATIENT ID: Gwendolyn Nguyen GENDER: female DOB: 08/23/1986, MRN: [id number]  Follow up for right-sided CP.  Video visit performed due to patient having a positive symptom screen raising concern for COVID.   Gwendolyn Nguyen is a 33 year old woman referred for evaluation of an unusual painful sensation in her right chest for 6 months.  Her past medical history is notable for small bowel obstruction and tubo-ovarian abscess in July 2020 related to PID, endometriosis, and chronic anemia.  She underwent percutaneous drainage of the pelvic abscess on 05/10/2019 with the drain removed on 05/24/2019.  She was treated with a course of metronidazole as an outpatient.   Her chest discomfort is described it as sharp pain when sneezing, but has a pulling sensation with normal breathing.  This began during her week of vomiting prior to presenting to the hospital with her bowel obstruction.  At that time she attributed it to pulling a muscle, but is concerned that it is persisted.  Over time it has not significantly changed, and she has never had this before.  She denies a personal and family history of blood clots, lung disease, rheumatologic disease, and sickle cell disease. She does not smoke or vape.  She has been anemic since starting menstruating as a teenager, and she denies anemia or exercise intolerance as a child.  She is never had a hemoglobin electrophoresis performed.  Since she had an NG tube placed while hospitalized she has had hoarseness that has slowly been improving.  Her voice is now stronger, but goes "out" after talking for a while.  She does not report that the insertion of the NG tube was difficult or traumatic.  She has not undergone any other instrumentation of her upper airway.  Today her chest discomfort is improved, but persists on the right side of her chest. Now it is lower on her right side, not  radiating above the breast. Last week she underwent a CT scan of her chest to rule out pleural disease or PE to explain her pain. Due to concern for allergic reaction (she has had a reaction to MRI contrast), she was premedicated with prednisone and H2-blocker. Since her CT, she has been sick, started that evening. She complains of belching, reflux, and abdominal pain over her ovaries. She has blood in her stool today and 2 days ago. She denies fever, but feels "off". She also endorses pain with urination. Overall she felt similar when she had PID. She has no history of intolerance to NSAIDs and has not been taking them recently. She an appointment with her PCP this morning and now has appointment with GI tomorrow for these new symptoms. No rashes, runny nose, or sore throat. She is concerned that she still has symptoms and does not want to return to work until this is resolved.      Past Medical History:  Diagnosis Date  . Anemia   . Back pain   . Constipation   . Endometriosis   . IBS (irritable bowel syndrome)   . Low hemoglobin   . Multiple food allergies   . PID (acute pelvic inflammatory disease) 99991111   complicated by tuboovarian abscess     Family History  Problem Relation Age of Onset  . Hyperlipidemia Mother   . Hypertension Mother   . Obesity Mother   . Hyperlipidemia Father   . Hypertension Father   . Obesity Father   .  Diabetes Maternal Grandmother      Past Surgical History:  Procedure Laterality Date  . IR RADIOLOGIST EVAL & MGMT  05/19/2019  . IR RADIOLOGIST EVAL & MGMT  05/24/2019    Social History   Socioeconomic History  . Marital status: Single    Spouse name: Not on file  . Number of children: Not on file  . Years of education: Not on file  . Highest education level: Not on file  Occupational History  . Occupation: Social worker  Social Needs  . Financial resource strain: Not on file  . Food insecurity    Worry: Not on file    Inability: Not on file   . Transportation needs    Medical: Not on file    Non-medical: Not on file  Tobacco Use  . Smoking status: Never Smoker  . Smokeless tobacco: Never Used  Substance and Sexual Activity  . Alcohol use: Yes    Comment: occassional  . Drug use: Not on file  . Sexual activity: Not on file  Lifestyle  . Physical activity    Days per week: Not on file    Minutes per session: Not on file  . Stress: Not on file  Relationships  . Social Herbalist on phone: Not on file    Gets together: Not on file    Attends religious service: Not on file    Active member of club or organization: Not on file    Attends meetings of clubs or organizations: Not on file    Relationship status: Not on file  . Intimate partner violence    Fear of current or ex partner: Not on file    Emotionally abused: Not on file    Physically abused: Not on file    Forced sexual activity: Not on file  Other Topics Concern  . Not on file  Social History Narrative  . Not on file     Allergies  Allergen Reactions  . Gadolinium Derivatives Hives, Itching and Nausea And Vomiting    Pt vomited immediately during injection.  15 minutes later, pt developed hives and itching.   . Shellfish Allergy Hives  . Sulfa Antibiotics Other (See Comments)    Patient was told to NOT TAKE THIS  . Iohexol Hives and Rash    08-2018, rash and hives, needs pre meds S/W PATIENT STATED REACTION FROM CT CONTRAST 11/19      There is no immunization history on file for this patient.  Outpatient Medications Prior to Visit  Medication Sig Dispense Refill  . estradiol cypionate (DEPO-ESTRADIOL) 5 MG/ML injection Inject into the muscle every 28 (twenty-eight) days.    . ferrous sulfate 325 (65 FE) MG tablet Take 325 mg by mouth daily with breakfast.     No facility-administered medications prior to visit.     Review of Systems  Constitutional: Negative for chills and fever.  HENT: Negative for congestion and sore throat.    Respiratory: Negative for cough and shortness of breath.   Cardiovascular: Positive for chest pain. Negative for leg swelling.  Gastrointestinal: Positive for abdominal pain, blood in stool, heartburn and nausea.  Genitourinary: Positive for dysuria.  Musculoskeletal: Negative for joint pain and myalgias.  Skin: Negative for rash.  Neurological: Positive for headaches. Negative for weakness.     Objective:   There were no vitals filed for this visit.    BMI Readings from Last 3 Encounters:  06/09/19 30.04 kg/m  05/03/19 33.13 kg/m  12/14/18 30.04 kg/m   Wt Readings from Last 3 Encounters:  06/09/19 175 lb (79.4 kg)  05/03/19 193 lb (87.5 kg)  12/14/18 175 lb (79.4 kg)    Physical Exam Unable to be performed on telephone call  CBC    Component Value Date/Time   WBC 9.7 05/14/2019 0412   RBC 4.50 06/09/2019 0944   HGB 11.7 06/09/2019 0944   HCT 37.7 06/09/2019 0944   PLT 648 (H) 05/14/2019 0412   MCV 83.8 06/09/2019 0944   MCH 26.0 (L) 06/09/2019 0944   MCHC 32.0 05/14/2019 0412   RDW 18.4 (H) 06/09/2019 0944   LYMPHSABS 2.2 05/07/2019 0445   MONOABS 1.2 (H) 05/07/2019 0445   EOSABS 0.2 05/07/2019 0445   BASOSABS 0.1 05/07/2019 0445   Hemoglobin A, A2, fetal hemoglobin WNL.Normal phenotype.  Iron 91 Ferritin 512 transferrin 210, saturation ratio 31% BMP within normal limits  Chest Imaging- films reviewed: CT chest 06/24/2019- no pulmonary embolus.  Linear scarring with surrounding mild groundglass opacities in the right middle lobe and superior segment of right lower lobe.  Normal left lung.  No pleural thickening or effusions.  No obvious rib or musculoskeletal abnormalities.  Pulmonary Functions Testing Results: No flowsheet data found.      Assessment & Plan:     ICD-10-CM   1. Atypical chest pain  R07.89   2. Anemia, unspecified type  D64.9   3. Lower abdominal pain  R10.30    Atypical chest pain not explained by chest CT.  No pulmonary embolus,  pleural inflammation, or pleural effusion.  The areas that look like scarring could be a resolving infection, but are not associated with pleural inflammation and therefore do not explain ongoing pain. -Question if this could be related to referred pain from her upper abdomen or diaphragm from previous PID. Not classic for Fitz-Hugh-Curtis syndrome. At presentation she did have bilirubin elevation, but normal transaminase levels, making this unlikely.  -No additional pulmonary evaluation warranted since this is likely referred pain. I have encouraged her to reach out to her PCP for return to work recommendations.  Anemia- formerly iron deficient, but appeals repleted.  Most recent iron studies are more consistent with anemia of chronic disease.  Normal hemoglobin electrophoresis. -We will defer ongoing management to primary care provider.  Lower abdominal pain- it is not clear that this is a reaction to contrast. With her heartburn and belching, raises concern for gastritis, although she has no history of this in the past -GI appointment tomorrow -Con't oral hydration -Consider Gyn follow up with dysuria.  Follow up PRN.   Current Outpatient Medications:  .  estradiol cypionate (DEPO-ESTRADIOL) 5 MG/ML injection, Inject into the muscle every 28 (twenty-eight) days., Disp: , Rfl:  .  ferrous sulfate 325 (65 FE) MG tablet, Take 325 mg by mouth daily with breakfast., Disp: , Rfl:      Julian Hy, DO [facility] 06/29/2019 8:32 AM

## 2019-06-29 NOTE — Therapy (Signed)
[place], [place], [address] Phone: [phone]   Fax:  [phone]  Physical Therapy Treatment  Patient Details  Name: Gwendolyn Nguyen MRN: [id number] Date of Birth: [birth year] Referring Provider (PT): Axel Filler, MD   Encounter Date: 06/29/2019  PT End of Session - 06/29/19 1629    Visit Number  6    Number of Visits  21    Date for PT Re-Evaluation  08/19/19    Authorization Type  BCBS    PT Start Time  [id number]    PT Stop Time  1630    PT Time Calculation (min)  45 min    Activity Tolerance  Patient tolerated treatment well    Behavior During Therapy  [facility] for tasks assessed/performed       Past Medical History:  Diagnosis Date  . Anemia   . Back pain   . Constipation   . Endometriosis   . IBS (irritable bowel syndrome)   . Low hemoglobin   . Multiple food allergies   . PID (acute pelvic inflammatory disease) 99991111   complicated by tuboovarian abscess    Past Surgical History:  Procedure Laterality Date  . IR RADIOLOGIST EVAL & MGMT  05/19/2019  . IR RADIOLOGIST EVAL & MGMT  05/24/2019    There were no vitals filed for this visit.                    [facility] Adult PT Treatment/Exercise - 06/29/19 0001      Lumbar Exercises: Aerobic   Recumbent Bike  5 min L1 beginning of session               PT Short Term Goals - 06/07/19 1720      PT SHORT TERM GOAL #1   Title  Pt will demo at least 1200 ft for 6MWT    Baseline  673ft at eval    Time  5    Period  Weeks    Status  New    Target Date  07/15/19      PT SHORT TERM GOAL #2   Title  able to demo SLS at least 5s each side    Baseline  unable at eval    Time  5    Period  Weeks    Status  New    Target Date  07/15/19      PT SHORT TERM GOAL #3   Title  Pt will demo good form with sit<>stand    Baseline  wide stance with hinge forward to shift weight    Time  5    Period  Weeks    Status  New     Target Date  07/15/19        PT Long Term Goals - 06/07/19 1715      PT LONG TERM GOAL #1   Title  pt will be able to navigate her stairs step over step wihtout difficulty    Baseline  severe difficulty at eval    Time  10    Period  Weeks    Status  New    Target Date  08/19/19      PT LONG TERM GOAL #2   Title  6MWT at least 2000 ft    Baseline  615 ft at eval    Time  10    Period  Weeks    Status  New    Target Date  08/19/19      PT LONG TERM GOAL #3   Title  5TSTS <10s    Baseline  28s at eval    Time  10    Period  Weeks    Status  New    Target Date  08/19/19      PT LONG TERM GOAL #4   Title  Pt will return to challenging HEP that will be carried on to long term exercise program    Baseline  unable at eval    Time  10    Period  Weeks    Status  New    Target Date  08/19/19      PT LONG TERM GOAL #5   Title  pt will be able to complete all household chores without limitaiton by fatigue or strength    Baseline  brother just left yesterday and he was doing everything    Time  10    Period  Weeks    Status  New    Target Date  08/19/19            Plan - 06/29/19 1630    Clinical Impression Statement  pt very tearful and frustrated today as she had a set back over the weekend due to GI issues that almost resulted in hospital admission. very slow gait with instability, noted increased effort to breathe. Screened costovertebral and costosternal joints which were clear, tender at lower ribs connecting cartilage. unable to demo core engagement due to Rt lower quadrant pain.significant difficulty completing 5 min on bike at beginning of session so 6MWT was not appropriate. she feels that something is wrong but everybody keeps telling her she is fine. Knows that multiple people get sick often in her work building and feels that it is not appropriate for her to be in the building. She can successfully complete work activities from home. I do not feel it is  appropriate for Gwendolyn Nguyen to return to work at this time due to limitations in function and endurance noted today. I wrote a note to her work to allow her to continue to work from home until end of [place] on 10/30. she has another PT appointment tomorrow but has hardly been able to eat or drink and I advised her to cancel if she is unable to ingest proper nutrition for exercise.    PT Treatment/Interventions  ADLs/Self Care Home Management;Cryotherapy;Electrical Stimulation;Ultrasound;Moist Heat;Iontophoresis 4mg /ml Dexamethasone;Traction;Gait training;Stair training;Functional mobility training;Therapeutic activities;Balance training;Therapeutic exercise;Patient/family education;Neuromuscular re-education;Manual techniques;Passive range of motion;Dry needling;Spinal Manipulations;Taping    PT Next Visit Plan  as tolerated    PT Home Exercise Plan  move during TV commercials, TrA, marching, bridge with ball, clam & reverse, anchored bands on physioball, primal push ups, heel raises    Consulted and Agree with Plan of Care  Patient       Patient will benefit from skilled therapeutic intervention in order to improve the following deficits and impairments:  Difficulty walking, Decreased endurance, Decreased activity tolerance, Pain, Improper body mechanics, Decreased balance, Decreased strength, Postural dysfunction  Visit Diagnosis: Difficulty in walking, not elsewhere classified  Muscle weakness (generalized)     Problem List Patient Active Problem List   Diagnosis Date Noted  . Physical deconditioning 05/14/2019  . PID (pelvic inflammatory disease) 05/14/2019  . Small bowel obstruction ([place])   . Pelvic mass 05/02/2019  . Elevated glucose 11/29/2018  . Class 1 obesity with serious comorbidity and body mass index (BMI) of 30.0 to 30.9 in adult  11/29/2018    Gwendolyn Nguyen C. Raynelle Fujikawa PT, DPT 06/29/19 4:37 PM   [facility] [address] [place], [place], [address] Phone: [phone]   Fax:  [phone]  Name: Gwendolyn Nguyen MRN: [id number] Date of Birth: Mar 04, 1986

## 2019-06-30 ENCOUNTER — Ambulatory Visit: Payer: BC Managed Care – PPO | Admitting: Physical Therapy

## 2019-07-01 ENCOUNTER — Other Ambulatory Visit: Payer: Self-pay | Admitting: Gastroenterology

## 2019-07-01 ENCOUNTER — Ambulatory Visit
Admission: RE | Admit: 2019-07-01 | Discharge: 2019-07-01 | Disposition: A | Discharge status: Home / Self Care | Payer: BC Managed Care – PPO | Source: Ambulatory Visit | Attending: Gastroenterology | Admitting: Gastroenterology

## 2019-07-01 DIAGNOSIS — R109 Unspecified abdominal pain: Secondary | ICD-10-CM

## 2019-07-04 ENCOUNTER — Other Ambulatory Visit: Payer: Self-pay

## 2019-07-04 ENCOUNTER — Ambulatory Visit: Payer: BC Managed Care – PPO | Admitting: Physical Therapy

## 2019-07-04 ENCOUNTER — Encounter: Payer: Self-pay | Admitting: Physical Therapy

## 2019-07-04 DIAGNOSIS — R262 Difficulty in walking, not elsewhere classified: Secondary | ICD-10-CM

## 2019-07-04 DIAGNOSIS — M6281 Muscle weakness (generalized): Secondary | ICD-10-CM

## 2019-07-04 NOTE — Therapy (Signed)
[place], [place], [address] Phone: [phone]   Fax:  [phone]  Physical Therapy Treatment  Patient Details  Name: Gwendolyn Nguyen MRN: [id number] Date of Birth: 1986-01-20 Referring Provider (PT): Axel Filler, MD   Encounter Date: 07/04/2019  PT End of Session - 07/04/19 1648    Visit Number  7    Number of Visits  21    Date for PT Re-Evaluation  08/19/19    Authorization Type  BCBS    PT Start Time  1630    PT Stop Time  [id number]    PT Time Calculation (min)  45 min    Activity Tolerance  Patient tolerated treatment well    Behavior During Therapy  [facility] for tasks assessed/performed       Past Medical History:  Diagnosis Date  . Anemia   . Back pain   . Constipation   . Endometriosis   . IBS (irritable bowel syndrome)   . Low hemoglobin   . Multiple food allergies   . PID (acute pelvic inflammatory disease) 99991111   complicated by tuboovarian abscess    Past Surgical History:  Procedure Laterality Date  . IR RADIOLOGIST EVAL & MGMT  05/19/2019  . IR RADIOLOGIST EVAL & MGMT  05/24/2019    There were no vitals filed for this visit.  Subjective Assessment - 07/04/19 1634    Subjective  Pt. reports feeling a lot better this week compared with last week. Mild chest pain and noting some abdominal discomfort but feels able to exercise today. Pt. noting some grip weakness and expresses frustration with having to have assistance to open jars/containers.    Currently in Pain?  Yes    Pain Score  4     Pain Location  Abdomen    Pain Orientation  Lower    Pain Descriptors / Indicators  Dull    Pain Type  Chronic pain    Pain Onset  More than a month ago    Pain Frequency  Constant    Aggravating Factors   dietary triggers    Pain Relieving Factors  medication helps sometimes                       [facility] Adult PT Treatment/Exercise - 07/04/19 0001      Exercises   Exercises  --   putty grip yellow 2x10 (issued for HEP)     Lumbar Exercises: Aerobic   Recumbent Bike  L1 x 5 min    Nustep  5 min L5 UE/LE      Lumbar Exercises: Standing   Heel Raises  20 reps    Functional Squats Limitations  partial squat at counter x 15 reps    Other Standing Lumbar Exercises  Theraband ext green 2x10Rockerboard dynamic balance side to side and fw/rev x 1 min ea., SLS on Airex 3 x 10 sec ea.    Other Standing Lumbar Exercises  pall off press 7 lbs. with cable x 15 reps ea. way      Lumbar Exercises: Seated   Other Seated Lumbar Exercises  seated on physioball: rows blue band 2x10, bilat. Theraband press facing away from anchor 2x10             PT Education - 07/04/19 1659    Education Details  POC, exercises    Person(s) Educated  Patient    Methods  Explanation;Demonstration    Comprehension  Verbalized  understanding;Returned demonstration       PT Short Term Goals - 06/07/19 1720      PT SHORT TERM GOAL #1   Title  Pt will demo at least 1200 ft for 6MWT    Baseline  684ft at eval    Time  5    Period  Weeks    Status  New    Target Date  07/15/19      PT SHORT TERM GOAL #2   Title  able to demo SLS at least 5s each side    Baseline  unable at eval    Time  5    Period  Weeks    Status  New    Target Date  07/15/19      PT SHORT TERM GOAL #3   Title  Pt will demo good form with sit<>stand    Baseline  wide stance with hinge forward to shift weight    Time  5    Period  Weeks    Status  New    Target Date  07/15/19        PT Long Term Goals - 06/07/19 1715      PT LONG TERM GOAL #1   Title  pt will be able to navigate her stairs step over step wihtout difficulty    Baseline  severe difficulty at eval    Time  10    Period  Weeks    Status  New    Target Date  08/19/19      PT LONG TERM GOAL #2   Title  6MWT at least 2000 ft    Baseline  615 ft at eval    Time  10    Period  Weeks    Status  New    Target Date   08/19/19      PT LONG TERM GOAL #3   Title  5TSTS <10s    Baseline  28s at eval    Time  10    Period  Weeks    Status  New    Target Date  08/19/19      PT LONG TERM GOAL #4   Title  Pt will return to challenging HEP that will be carried on to long term exercise program    Baseline  unable at eval    Time  10    Period  Weeks    Status  New    Target Date  08/19/19      PT LONG TERM GOAL #5   Title  pt will be able to complete all household chores without limitaiton by fatigue or strength    Baseline  brother just left yesterday and he was doing everything    Time  10    Period  Weeks    Status  New    Target Date  08/19/19            Plan - 07/04/19 1659    Clinical Impression Statement  Able to resume previous exercises today with good tolerance (mild fatigue but session well-tolerated with intermittent rest breaks to recover). Added putty grip (issued yellow putty for home use) to work on grip re: complaints as noted in subjective. Pt. would benefit from continued PT to improve functional status with mobility and activity tolerance with ongoing monitoring re: medical status.    Personal Factors and Comorbidities  Comorbidity 1    Comorbidities  multiple internal processes affected by infection, low hemoglobin  Examination-Activity Limitations  Bathing;Locomotion Level;Bed Mobility;Bend;Sit;Carry;Sleep;Squat;Dressing;Stairs;Stand;Lift    Examination-Participation Restrictions  Meal Prep;Cleaning;Community Activity;Laundry    Stability/Clinical Decision Making  Evolving/Moderate complexity    Clinical Decision Making  Low    Rehab Potential  Good    PT Frequency  2x / week    PT Duration  --   10 weeks   PT Treatment/Interventions  ADLs/Self Care Home Management;Cryotherapy;Electrical Stimulation;Ultrasound;Moist Heat;Iontophoresis 4mg /ml Dexamethasone;Traction;Gait training;Stair training;Functional mobility training;Therapeutic activities;Balance  training;Therapeutic exercise;Patient/family education;Neuromuscular re-education;Manual techniques;Passive range of motion;Dry needling;Spinal Manipulations;Taping    PT Next Visit Plan  as tolerated    PT Home Exercise Plan  move during TV commercials, TrA, marching, bridge with ball, clam & reverse, anchored bands on physioball, primal push ups, heel raises    Consulted and Agree with Plan of Care  Patient       Patient will benefit from skilled therapeutic intervention in order to improve the following deficits and impairments:  Difficulty walking, Decreased endurance, Decreased activity tolerance, Pain, Improper body mechanics, Decreased balance, Decreased strength, Postural dysfunction  Visit Diagnosis: Difficulty in walking, not elsewhere classified  Muscle weakness (generalized)     Problem List Patient Active Problem List   Diagnosis Date Noted  . Physical deconditioning 05/14/2019  . PID (pelvic inflammatory disease) 05/14/2019  . Small bowel obstruction ([place])   . Pelvic mass 05/02/2019  . Elevated glucose 11/29/2018  . Class 1 obesity with serious comorbidity and body mass index (BMI) of 30.0 to 30.9 in adult 11/29/2018    Beaulah Dinning, PT, DPT 07/04/19 5:11 PM  [place] [facility] [address] [place], [place], [address] Phone: [phone]   Fax:  [phone]  Name: Gwendolyn Nguyen MRN: [id number] Date of Birth: [birth year]

## 2019-07-06 ENCOUNTER — Encounter: Payer: Self-pay | Admitting: Physical Therapy

## 2019-07-06 ENCOUNTER — Ambulatory Visit: Payer: BC Managed Care – PPO | Admitting: Physical Therapy

## 2019-07-06 ENCOUNTER — Other Ambulatory Visit: Payer: Self-pay

## 2019-07-06 VITALS — BP 115/93 | HR 148

## 2019-07-06 DIAGNOSIS — R262 Difficulty in walking, not elsewhere classified: Secondary | ICD-10-CM | POA: Diagnosis not present

## 2019-07-06 DIAGNOSIS — M6281 Muscle weakness (generalized): Secondary | ICD-10-CM

## 2019-07-06 NOTE — Therapy (Signed)
[place], [place], [address] Phone: [phone]   Fax:  [phone]  Physical Therapy Treatment  Patient Details  Name: Gwendolyn Nguyen MRN: [id number] Date of Birth: [birth year] Referring Provider (PT): Axel Filler, MD   Encounter Date: 07/06/2019  PT End of Session - 07/06/19 1653    Visit Number  8    Number of Visits  21    Date for PT Re-Evaluation  08/19/19    Authorization Type  [employer]    PT Start Time  [id number]    PT Stop Time  1703   ended early per pt. request due to pain   PT Time Calculation (min)  32 min    Activity Tolerance  Patient limited by pain;Patient limited by fatigue    Behavior During Therapy  [facility] for tasks assessed/performed       Past Medical History:  Diagnosis Date  . Anemia   . Back pain   . Constipation   . Endometriosis   . IBS (irritable bowel syndrome)   . Low hemoglobin   . Multiple food allergies   . PID (acute pelvic inflammatory disease) 99991111   complicated by tuboovarian abscess    Past Surgical History:  Procedure Laterality Date  . IR RADIOLOGIST EVAL & MGMT  05/19/2019  . IR RADIOLOGIST EVAL & MGMT  05/24/2019    There were no vitals filed for this visit.  Subjective Assessment - 07/06/19 1643    Subjective  Some thigh soreness reported after last session, reports feeling OK today-see "plan" regarding abdominal pain reported after 6 min walk test and vitals.    Currently in Pain?  Yes   rated after 6 min walk test and exercises   Pain Score  7     Pain Location  Abdomen    Pain Orientation  Lower    Pain Descriptors / Indicators  Dull    Pain Type  Chronic pain    Pain Onset  More than a month ago    Pain Frequency  Constant         [facility] PT Assessment - 07/06/19 0001      6 minute walk test results    Aerobic Endurance Distance Walked  688    Endurance additional comments  no rest break, used [facility]                    [facility] Adult PT Treatment/Exercise - 07/06/19 0001      Lumbar Exercises: Standing   Heel Raises  --    Heel Raises Limitations  --    Shoulder Extension  AROM;Strengthening;Both;20 reps    Theraband Level (Shoulder Extension)  Level 4 (Blue)    Other Standing Lumbar Exercises  Theraband ext green 2x10Rockerboard dynamic balance side to side and fw/rev x 1 min ea., SLS on Airex 3 x 10 sec ea.    Other Standing Lumbar Exercises  pall off press with blue Theraband x 15 ea. way      Lumbar Exercises: Seated   Other Seated Lumbar Exercises  seated on physioball: rows blue band 2x10, bilat. Theraband press facing away from anchor 2x10             PT Education - 07/06/19 1653    Education Details  6 minute walk test results, POC    Person(s) Educated  Patient    Methods  Explanation    Comprehension  Verbalized understanding  PT Short Term Goals - 06/07/19 1720      PT SHORT TERM GOAL #1   Title  Pt will demo at least 1200 ft for 6MWT    Baseline  632ft at eval    Time  5    Period  Weeks    Status  New    Target Date  07/15/19      PT SHORT TERM GOAL #2   Title  able to demo SLS at least 5s each side    Baseline  unable at eval    Time  5    Period  Weeks    Status  New    Target Date  07/15/19      PT SHORT TERM GOAL #3   Title  Pt will demo good form with sit<>stand    Baseline  wide stance with hinge forward to shift weight    Time  5    Period  Weeks    Status  New    Target Date  07/15/19        PT Long Term Goals - 07/06/19 1710      PT LONG TERM GOAL #1   Title  pt will be able to navigate her stairs step over step wihtout difficulty    Baseline  still with difficulty    Time  10    Period  Weeks    Status  On-going      PT LONG TERM GOAL #2   Title  6MWT at least 2000 ft    Baseline  688 feet    Time  10    Period  Weeks    Status  On-going      PT LONG TERM GOAL #3   Title  5TSTS <10s    Baseline  28s at  eval, not retested today    Time  10    Period  Weeks    Status  On-going      PT LONG TERM GOAL #4   Title  Pt will return to challenging HEP that will be carried on to long term exercise program    Baseline  unable    Time  10    Period  Weeks    Status  On-going      PT LONG TERM GOAL #5   Title  pt will be able to complete all household chores without limitaiton by fatigue or strength    Baseline  still limited/needs intermittent assist from family    Time  10    Period  Weeks    Status  On-going            Plan - 07/06/19 1655    Clinical Impression Statement  6 minute walk test still improved from baseline but decreased from last session. Pt. had initially reported doing OK at beginning of session but noted abdominal pain after walking/during subsequent exercises and reports that her pain medication was wearing off/was not able to take as soon prior to session as normal. More rests required between subsequent exercises and pt. requested to stop session after the first 30 minutes due to pain.Vitals were checked after exercises with HR 148, BP 115/93 and O2 99%. Pt. rested for 5 minutes and HR decreased to 129 bpm (pt. reported felt like elevated vitals due to pain level). Advised pt. to monitor her heart rate and if not lowering (<100 bpm) after taking pain medication and further rest to contact her MD to alert/for further  instructions (she reports after hours line available).    Personal Factors and Comorbidities  Comorbidity 1    Comorbidities  multiple internal processes affected by infection, low hemoglobin    Examination-Activity Limitations  Bathing;Locomotion Level;Bed Mobility;Bend;Sit;Carry;Sleep;Squat;Dressing;Stairs;Stand;Lift    Examination-Participation Restrictions  Meal Prep;Cleaning;Community Activity;Laundry    Stability/Clinical Decision Making  Evolving/Moderate complexity    Clinical Decision Making  Low    Rehab Potential  Good    PT Frequency  2x / week     PT Duration  --   10 weeks   PT Treatment/Interventions  ADLs/Self Care Home Management;Cryotherapy;Electrical Stimulation;Ultrasound;Moist Heat;Iontophoresis 4mg /ml Dexamethasone;Traction;Gait training;Stair training;Functional mobility training;Therapeutic activities;Balance training;Therapeutic exercise;Patient/family education;Neuromuscular re-education;Manual techniques;Passive range of motion;Dry needling;Spinal Manipulations;Taping    PT Next Visit Plan  as tolerated, monitor vitals as needed    PT Home Exercise Plan  move during TV commercials, TrA, marching, bridge with ball, clam & reverse, anchored bands on physioball, primal push ups, heel raises    Consulted and Agree with Plan of Care  Patient       Patient will benefit from skilled therapeutic intervention in order to improve the following deficits and impairments:  Difficulty walking, Decreased endurance, Decreased activity tolerance, Pain, Improper body mechanics, Decreased balance, Decreased strength, Postural dysfunction  Visit Diagnosis: Difficulty in walking, not elsewhere classified  Muscle weakness (generalized)     Problem List Patient Active Problem List   Diagnosis Date Noted  . Physical deconditioning 05/14/2019  . PID (pelvic inflammatory disease) 05/14/2019  . Small bowel obstruction ([place])   . Pelvic mass 05/02/2019  . Elevated glucose 11/29/2018  . Class 1 obesity with serious comorbidity and body mass index (BMI) of 30.0 to 30.9 in adult 11/29/2018    Beaulah Dinning, PT, DPT 07/06/19 5:13 PM  [facility] [address] [place], [place], [address] Phone: [phone]   Fax:  [phone]  Name: Gwendolyn Nguyen MRN: [id number] Date of Birth: [birth year]

## 2019-07-11 ENCOUNTER — Ambulatory Visit: Payer: BC Managed Care – PPO | Admitting: Physical Therapy

## 2019-07-13 ENCOUNTER — Ambulatory Visit: Payer: BC Managed Care – PPO | Admitting: Physical Therapy

## 2019-07-18 ENCOUNTER — Other Ambulatory Visit: Payer: Self-pay

## 2019-07-18 ENCOUNTER — Encounter: Payer: Self-pay | Admitting: Family Medicine

## 2019-07-18 ENCOUNTER — Ambulatory Visit: Payer: BC Managed Care – PPO | Admitting: Family Medicine

## 2019-07-18 VITALS — BP 119/79 | HR 114 | Temp 98.8°F | Resp 18 | Ht 64.0 in | Wt 164.4 lb

## 2019-07-18 DIAGNOSIS — R109 Unspecified abdominal pain: Secondary | ICD-10-CM | POA: Diagnosis not present

## 2019-07-18 DIAGNOSIS — Z23 Encounter for immunization: Secondary | ICD-10-CM | POA: Diagnosis not present

## 2019-07-18 DIAGNOSIS — G8929 Other chronic pain: Secondary | ICD-10-CM

## 2019-07-18 DIAGNOSIS — K625 Hemorrhage of anus and rectum: Secondary | ICD-10-CM

## 2019-07-18 DIAGNOSIS — M6289 Other specified disorders of muscle: Secondary | ICD-10-CM

## 2019-07-18 DIAGNOSIS — R0609 Other forms of dyspnea: Secondary | ICD-10-CM

## 2019-07-18 DIAGNOSIS — N809 Endometriosis, unspecified: Secondary | ICD-10-CM | POA: Diagnosis not present

## 2019-07-18 MED ORDER — TRAMADOL HCL 50 MG PO TABS: 50.0000 mg | ORAL_TABLET | Freq: Three times a day (TID) | ORAL | 0 refills | Status: DC | PRN

## 2019-07-18 MED ORDER — HYDROCODONE-ACETAMINOPHEN 5-325 MG PO TABS: 1.0000 | ORAL_TABLET | Freq: Four times a day (QID) | ORAL | 0 refills | Status: DC | PRN

## 2019-07-18 NOTE — Progress Notes (Signed)
New Patient Office Visit  Subjective:  Patient ID: Gwendolyn Nguyen, female    DOB: [birth year]  Age: 33 y.o. MRN: [id number]  CC:  Chief Complaint  Patient presents with  . New Patient (Initial Visit)    per pt would like to discuss pain management, would like testing for autoimmune disorders, abdominal pain intermittent x 06/24/2019 and today pain level is 5/10, taking tylenol and hydrocodone for pain but it is not helping.  Pt requesting stronger pain med or another pain me.  Per pt pain is in her left side under her stomach and pain in pelvic area due to infection.   Pt would like recommendation for an otc sleep aid or rx for sleep    HPI Gwendolyn Nguyen presents for   Patient reports that she keeps having pain that is abdominopelvic and recurrent pelvic infections.  She reports that over the summer she had   She denies any history of pregnancy She is now on Depo and Birth Control patch  She keeps getting infection and does not know why She states that she is frustrated because she is on recurrent antibiotics She had evaluation with GI and feels like she has been referred for colonoscopy She states that she reports that she works as a Education officer, museum but due to her frequent blood in her stools and pain she cannot work and is in rehab because she cannot walk  Dr. Garwin Brothers is her OB/GYN She had a SBO this summer    She reports that she has DOE She had a CT of the chest and evaluation with Pulmonology She reports that she just finished another flare of bloody bowel movements and abdominal pain. She has a history of pain in the ovaries and pain in the abdomen and they seemed like they were linked on the same cycle. She states that she gets blood in her stool.  She states that she has persistent bleeding through her contraception with the Depo-Estradiol.  She is on Metronidazole with doxycycline.  She has about a week left.    She was started on ferrous sulfate which was started for her  persistent anemia and required hemoglobin  She denies history of sexual abuse. No history of depression.  She denies any family history of ovarian or uterine cancer.     EXAM: ABDOMEN - 2 VIEW  COMPARISON:  CT 05/19/2019  FINDINGS: No bowel dilatation to suggest obstruction. No free intra-abdominal air. Small to moderate stool burden in the proximal colon, small volume of stool distally. No radiopaque calculi or abnormal soft tissue calcifications. Lower most lung bases are clear. Previous drainage catheter is been removed.  IMPRESSION: Normal bowel gas pattern, no obstruction. Small to moderate volume of colonic stool.   Electronically Signed   By: Keith Rake M.D.   On: 07/02/2019 01:49     Past Medical History:  Diagnosis Date  . Anemia   . Back pain   . Constipation   . Endometriosis   . IBS (irritable bowel syndrome)   . Low hemoglobin   . Multiple food allergies   . PID (acute pelvic inflammatory disease) 99991111   complicated by tuboovarian abscess    Past Surgical History:  Procedure Laterality Date  . IR RADIOLOGIST EVAL & MGMT  05/19/2019  . IR RADIOLOGIST EVAL & MGMT  05/24/2019    Family History  Problem Relation Age of Onset  . Hyperlipidemia Mother   . Hypertension Mother   . Obesity Mother   .  Hyperlipidemia Father   . Hypertension Father   . Obesity Father   . Diabetes Maternal Grandmother     Social History   Socioeconomic History  . Marital status: Single    Spouse name: Not on file  . Number of children: Not on file  . Years of education: Not on file  . Highest education level: Not on file  Occupational History  . Occupation: Social worker  Social Needs  . Financial resource strain: Not on file  . Food insecurity    Worry: Not on file    Inability: Not on file  . Transportation needs    Medical: Not on file    Non-medical: Not on file  Tobacco Use  . Smoking status: Never Smoker  . Smokeless tobacco: Never Used   Substance and Sexual Activity  . Alcohol use: Yes    Comment: occassional  . Drug use: Not on file  . Sexual activity: Not on file  Lifestyle  . Physical activity    Days per week: Not on file    Minutes per session: Not on file  . Stress: Not on file  Relationships  . Social Herbalist on phone: Not on file    Gets together: Not on file    Attends religious service: Not on file    Active member of club or organization: Not on file    Attends meetings of clubs or organizations: Not on file    Relationship status: Not on file  . Intimate partner violence    Fear of current or ex partner: Not on file    Emotionally abused: Not on file    Physically abused: Not on file    Forced sexual activity: Not on file  Other Topics Concern  . Not on file  Social History Narrative  . Not on file    ROS Review of Systems  Review of Systems  Constitutional: Negative for activity change, appetite change, chills and fever.  HENT: Negative for congestion, nosebleeds, trouble swallowing and voice change.   Respiratory: Negative for cough, shortness of breath and wheezing.   Gastrointestinal: see hpi Genitourinary: see hpi Musculoskeletal: Negative for back pain, joint swelling and neck pain.  Neurological: Negative for dizziness, speech difficulty, light-headedness and numbness.  See HPI. All other review of systems negative.    Objective:   Today's Vitals: BP 119/79 (BP Location: Left Arm, Patient Position: Sitting, Cuff Size: Normal)   Pulse (!) 114   Temp 98.8 F (37.1 C) (Oral)   Resp 18   Ht 5\' 4"  (1.626 m)   Wt 164 lb 6.4 oz (74.6 kg)   LMP 06/21/2019 Comment: pt on depo and has breakthrough bleeding  SpO2 100%   BMI 28.22 kg/m   Physical Exam  Physical Exam  Constitutional: Oriented to person, place, and time. Appears well-developed and well-nourished.  HENT:  Head: Normocephalic and atraumatic.  Eyes: Conjunctivae and EOM are normal.  Cardiovascular:  Normal rate, regular rhythm, normal heart sounds and intact distal pulses.  No murmur heard. Pulmonary/Chest: Effort normal and breath sounds normal. No stridor. No respiratory distress. Has no wheezes.  Abdomen:  Diffusely tender but worse in the left lower quadrant, no rebound, no guarding Neurological: Is alert and oriented to person, place, and time.  Skin: Skin is warm. Capillary refill takes less than 2 seconds.  Psychiatric: Has a normal mood and affect. Behavior is normal. Judgment and thought content normal.   CLINICAL DATA:  Chest pain  EXAM: CT CHEST WITH CONTRAST  TECHNIQUE: Multidetector CT imaging of the chest was performed during intravenous contrast administration.  CONTRAST:  15mL ISOVUE-300 IOPAMIDOL (ISOVUE-300) INJECTION 61%  COMPARISON:  May 02, 2019.  FINDINGS: Cardiovascular: There is no large centrally located pulmonary embolism. Heart size is normal. There is no significant pericardial effusion. No evidence for an aortic dissection.  Mediastinum/Nodes:  --No mediastinal or hilar lymphadenopathy.  --No axillary lymphadenopathy.  --No supraclavicular lymphadenopathy.  --Normal thyroid gland.  --The esophagus is unremarkable  Lungs/Pleura: There are few linear airspace opacities in the right lower lobe, right middle lobe, and right upper lobe. There is no large focal infiltrate. There is no pneumothorax or pleural effusion.  Upper Abdomen: No acute abnormality detected.  Musculoskeletal: No chest wall abnormality. No acute or significant osseous findings. There is a small sclerotic lesion in the sternum, of doubtful clinical significance. This is stable from July 13th CT.  IMPRESSION: 1. There are few linear airspace opacities in the right upper lobe, right middle lobe, and right lower lobe, likely representing areas of subsegmental atelectasis. No large focal infiltrate. 2. No evidence for aortic dissection or large pulmonary  embolism.   Electronically Signed   By: Constance Holster M.D.   On: 06/24/2019 20:34   Assessment & Plan:   Problem List Items Addressed This Visit    None    Visit Diagnoses    Endometriosis    -  Primary   Need for prophylactic vaccination and inoculation against influenza       Need for vaccination       Chronic abdominal pain       Relevant Medications   HYDROcodone-acetaminophen (NORCO) 5-325 MG tablet   traMADol (ULTRAM) 50 MG tablet   Muscle fatigue       DOE (dyspnea on exertion)       Blood per rectum         A total of 50 minutes were spent face-to-face with the patient during this encounter and over half of that time was spent on counseling and coordination of care.  Reviewed hospital course, ER visits as well as specialists reports and imaging  Pt is distraught about the pain, the uncertainty, the future of her job as well as her future fertility. Offered support Will prescribe Norco with tramadol for breakthrough After colonoscopy will review results and if no known cause will get autoimmune labs and lime disease test.     Outpatient Encounter Medications as of 07/18/2019  Medication Sig  . estradiol cypionate (DEPO-ESTRADIOL) 5 MG/ML injection Inject into the muscle every 28 (twenty-eight) days.  . ferrous sulfate 325 (65 FE) MG tablet Take 325 mg by mouth daily with breakfast.  . HYDROcodone-acetaminophen (NORCO) 5-325 MG tablet Take 1 tablet by mouth every 6 (six) hours as needed for moderate pain.  . traMADol (ULTRAM) 50 MG tablet Take 1 tablet (50 mg total) by mouth every 8 (eight) hours as needed for up to 5 days for severe pain.   No facility-administered encounter medications on file as of 07/18/2019.     Follow-up: No follow-ups on file.   Forrest Moron, MD

## 2019-07-18 NOTE — Patient Instructions (Addendum)
   Please double book on Monday 8am or Tuesday 10:40am next week    If you have lab work done today you will be contacted with your lab results within the next 2 weeks.  If you have not heard from [place] then please contact us. The fastest way to get your results is to register for My Chart.   IF you received an x-ray today, you will receive an invoice from [facility]. Please contact [facility] at [phone] with questions or concerns regarding your invoice.   IF you received labwork today, you will receive an invoice from [facility] [facility]. Please contact [facility] at [phone] with questions or concerns regarding your invoice.   Our billing staff will not be able to assist you with questions regarding bills from these companies.  You will be contacted with the lab results as soon as they are available. The fastest way to get your results is to activate your My Chart account. Instructions are located on the last page of this paperwork. If you have not heard from [place] regarding the results in 2 weeks, please contact this office.

## 2019-07-19 ENCOUNTER — Ambulatory Visit: Payer: BC Managed Care – PPO | Admitting: Physical Therapy

## 2019-07-20 ENCOUNTER — Other Ambulatory Visit: Payer: Self-pay

## 2019-07-20 ENCOUNTER — Inpatient Hospital Stay (HOSPITAL_COMMUNITY)
Admission: EM | Admit: 2019-07-20 | Discharge: 2019-08-05 | DRG: 749 | Disposition: A | Discharge status: Home / Self Care | Payer: BC Managed Care – PPO | Attending: Obstetrics and Gynecology | Admitting: Obstetrics and Gynecology

## 2019-07-20 ENCOUNTER — Emergency Department (HOSPITAL_COMMUNITY): Payer: BC Managed Care – PPO

## 2019-07-20 DIAGNOSIS — Z91013 Allergy to seafood: Secondary | ICD-10-CM | POA: Diagnosis not present

## 2019-07-20 DIAGNOSIS — Z8349 Family history of other endocrine, nutritional and metabolic diseases: Secondary | ICD-10-CM | POA: Diagnosis not present

## 2019-07-20 DIAGNOSIS — R739 Hyperglycemia, unspecified: Secondary | ICD-10-CM | POA: Diagnosis not present

## 2019-07-20 DIAGNOSIS — N803 Endometriosis of pelvic peritoneum: Secondary | ICD-10-CM | POA: Diagnosis present

## 2019-07-20 DIAGNOSIS — R109 Unspecified abdominal pain: Secondary | ICD-10-CM | POA: Diagnosis present

## 2019-07-20 DIAGNOSIS — Z20828 Contact with and (suspected) exposure to other viral communicable diseases: Secondary | ICD-10-CM | POA: Diagnosis present

## 2019-07-20 DIAGNOSIS — R1032 Left lower quadrant pain: Secondary | ICD-10-CM | POA: Diagnosis present

## 2019-07-20 DIAGNOSIS — D649 Anemia, unspecified: Secondary | ICD-10-CM | POA: Diagnosis present

## 2019-07-20 DIAGNOSIS — Z91041 Radiographic dye allergy status: Secondary | ICD-10-CM | POA: Diagnosis not present

## 2019-07-20 DIAGNOSIS — R1030 Lower abdominal pain, unspecified: Secondary | ICD-10-CM | POA: Diagnosis not present

## 2019-07-20 DIAGNOSIS — E876 Hypokalemia: Secondary | ICD-10-CM | POA: Diagnosis not present

## 2019-07-20 DIAGNOSIS — Z888 Allergy status to other drugs, medicaments and biological substances status: Secondary | ICD-10-CM

## 2019-07-20 DIAGNOSIS — M62838 Other muscle spasm: Secondary | ICD-10-CM | POA: Diagnosis not present

## 2019-07-20 DIAGNOSIS — N736 Female pelvic peritoneal adhesions (postinfective): Secondary | ICD-10-CM | POA: Diagnosis present

## 2019-07-20 DIAGNOSIS — Z833 Family history of diabetes mellitus: Secondary | ICD-10-CM

## 2019-07-20 DIAGNOSIS — N7093 Salpingitis and oophoritis, unspecified: Secondary | ICD-10-CM | POA: Diagnosis present

## 2019-07-20 DIAGNOSIS — Z8249 Family history of ischemic heart disease and other diseases of the circulatory system: Secondary | ICD-10-CM

## 2019-07-20 DIAGNOSIS — I471 Supraventricular tachycardia: Secondary | ICD-10-CM | POA: Diagnosis present

## 2019-07-20 DIAGNOSIS — Z882 Allergy status to sulfonamides status: Secondary | ICD-10-CM | POA: Diagnosis not present

## 2019-07-20 DIAGNOSIS — T380X5A Adverse effect of glucocorticoids and synthetic analogues, initial encounter: Secondary | ICD-10-CM | POA: Diagnosis not present

## 2019-07-20 LAB — COMPREHENSIVE METABOLIC PANEL
ALT: 40 U/L (ref 0–44)
AST: 32 U/L (ref 15–41)
Albumin: 3.8 g/dL (ref 3.5–5.0)
Alkaline Phosphatase: 50 U/L (ref 38–126)
Anion gap: 13 (ref 5–15)
BUN: 8 mg/dL (ref 6–20)
CO2: 21 mmol/L — ABNORMAL LOW (ref 22–32)
Calcium: 9.9 mg/dL (ref 8.9–10.3)
Chloride: 105 mmol/L (ref 98–111)
Creatinine, Ser: 1 mg/dL (ref 0.44–1.00)
GFR calc Af Amer: >60 mL/min (ref >60)
GFR calc non Af Amer: >60 mL/min (ref >60)
Glucose, Bld: 119 mg/dL — ABNORMAL HIGH (ref 70–99)
Potassium: 3.5 mmol/L (ref 3.5–5.1)
Sodium: 139 mmol/L (ref 135–145)
Total Bilirubin: 0.4 mg/dL (ref 0.3–1.2)
Total Protein: 7.4 g/dL (ref 6.5–8.1)

## 2019-07-20 LAB — URINALYSIS, ROUTINE W REFLEX MICROSCOPIC
Bilirubin Urine: NEGATIVE
Glucose, UA: NEGATIVE mg/dL
Ketones, ur: NEGATIVE mg/dL
Leukocytes,Ua: NEGATIVE
Nitrite: NEGATIVE
Protein, ur: NEGATIVE mg/dL
Specific Gravity, Urine: 1.008 (ref 1.005–1.030)
pH: 7 (ref 5.0–8.0)

## 2019-07-20 LAB — LIPASE, BLOOD: Lipase: 27 U/L (ref 11–51)

## 2019-07-20 LAB — CBC
HCT: 34.2 % — ABNORMAL LOW (ref 36.0–46.0)
Hemoglobin: 11.2 g/dL — ABNORMAL LOW (ref 12.0–15.0)
MCH: 28.2 pg (ref 26.0–34.0)
MCHC: 32.7 g/dL (ref 30.0–36.0)
MCV: 86.1 fL (ref 80.0–100.0)
Platelets: 472 K/uL — ABNORMAL HIGH (ref 150–400)
RBC: 3.97 MIL/uL (ref 3.87–5.11)
RDW: 15.8 % — ABNORMAL HIGH (ref 11.5–15.5)
WBC: 16.9 K/uL — ABNORMAL HIGH (ref 4.0–10.5)
nRBC: 0 % (ref 0.0–0.2)

## 2019-07-20 LAB — I-STAT BETA HCG BLOOD, ED (MC, WL, AP ONLY): I-stat hCG, quantitative: <5 m[IU]/mL

## 2019-07-20 LAB — LACTIC ACID, PLASMA: Lactic Acid, Venous: 1.2 mmol/L (ref 0.5–1.9)

## 2019-07-20 LAB — HM SIGMOIDOSCOPY

## 2019-07-20 MED ORDER — ACETAMINOPHEN 650 MG RE SUPP: 650.0000 mg | Freq: Four times a day (QID) | RECTAL | Status: DC | PRN

## 2019-07-20 MED ORDER — ONDANSETRON 4 MG PO TBDP: 4.0000 mg | ORAL_TABLET | Freq: Once | ORAL | Status: DC | PRN

## 2019-07-20 MED ORDER — ONDANSETRON HCL 4 MG PO TABS: 4.0000 mg | ORAL_TABLET | Freq: Four times a day (QID) | ORAL | Status: DC | PRN

## 2019-07-20 MED ORDER — HYDROMORPHONE HCL 1 MG/ML IJ SOLN
0.5000 mg | Freq: Once | INTRAMUSCULAR | Status: AC
  Administered 2019-07-20: 20:00:00 0.5 mg via INTRAVENOUS
  Filled 2019-07-20: qty 1

## 2019-07-20 MED ORDER — PIPERACILLIN-TAZOBACTAM 3.375 G IVPB
3.3750 g | Freq: Three times a day (TID) | INTRAVENOUS | Status: DC
  Administered 2019-07-21 – 2019-07-23 (×8): 3.375 g via INTRAVENOUS
  Filled 2019-07-20 (×9): qty 50

## 2019-07-20 MED ORDER — ONDANSETRON HCL 4 MG/2ML IJ SOLN: 4.0000 mg | Freq: Once | INTRAMUSCULAR | Status: DC

## 2019-07-20 MED ORDER — ONDANSETRON HCL 4 MG/2ML IJ SOLN
4.0000 mg | Freq: Once | INTRAMUSCULAR | Status: AC
  Administered 2019-07-20: 4 mg via INTRAVENOUS
  Filled 2019-07-20: qty 2

## 2019-07-20 MED ORDER — SODIUM CHLORIDE 0.9 % IV SOLN
INTRAVENOUS | Status: DC
  Administered 2019-07-20 – 2019-07-25 (×8): via INTRAVENOUS

## 2019-07-20 MED ORDER — ACETAMINOPHEN 325 MG PO TABS
650.0000 mg | ORAL_TABLET | Freq: Four times a day (QID) | ORAL | Status: DC | PRN
  Administered 2019-07-21 – 2019-07-23 (×5): 650 mg via ORAL
  Filled 2019-07-20 (×6): qty 2

## 2019-07-20 MED ORDER — METRONIDAZOLE IN NACL 5-0.79 MG/ML-% IV SOLN
500.0000 mg | Freq: Once | INTRAVENOUS | Status: AC
  Administered 2019-07-20: 22:00:00 500 mg via INTRAVENOUS
  Filled 2019-07-20: qty 100

## 2019-07-20 MED ORDER — SODIUM CHLORIDE 0.9 % IV BOLUS
1000.0000 mL | Freq: Once | INTRAVENOUS | Status: AC
  Administered 2019-07-20: 1000 mL via INTRAVENOUS

## 2019-07-20 MED ORDER — ONDANSETRON HCL 4 MG/2ML IJ SOLN
4.0000 mg | Freq: Four times a day (QID) | INTRAMUSCULAR | Status: DC | PRN
  Administered 2019-07-20: 4 mg via INTRAVENOUS
  Filled 2019-07-20: qty 2

## 2019-07-20 MED ORDER — HYDROMORPHONE HCL 1 MG/ML IJ SOLN
2.0000 mg | Freq: Once | INTRAMUSCULAR | Status: AC
  Administered 2019-07-20: 2 mg via INTRAMUSCULAR
  Filled 2019-07-20: qty 2

## 2019-07-20 MED ORDER — SODIUM CHLORIDE 0.9 % IV SOLN
100.0000 mg | Freq: Two times a day (BID) | INTRAVENOUS | Status: DC
  Administered 2019-07-21: 100 mg via INTRAVENOUS
  Filled 2019-07-20 (×2): qty 100

## 2019-07-20 MED ORDER — SODIUM CHLORIDE 0.9% FLUSH
3.0000 mL | Freq: Once | INTRAVENOUS | Status: AC
  Administered 2019-07-20: 17:00:00 3 mL via INTRAVENOUS

## 2019-07-20 MED ORDER — HYDROMORPHONE HCL 1 MG/ML IJ SOLN: 1.0000 mg | Freq: Once | INTRAMUSCULAR | Status: DC

## 2019-07-20 MED ORDER — ONDANSETRON 4 MG PO TBDP: 4.0000 mg | ORAL_TABLET | Freq: Once | ORAL | Status: DC

## 2019-07-20 MED ORDER — HYDROMORPHONE HCL 1 MG/ML IJ SOLN
1.0000 mg | INTRAMUSCULAR | Status: DC | PRN
  Administered 2019-07-20 – 2019-07-21 (×3): 1 mg via INTRAVENOUS
  Filled 2019-07-20 (×4): qty 1

## 2019-07-20 NOTE — ED Notes (Signed)
ED TO INPATIENT HANDOFF REPORT  ED Nurse Name and Phone #: [phone]  S Name/Age/Gender Gwendolyn Nguyen 33 y.o. female Room/Bed: 018C/018C  Code Status   Code Status: Full Code  Home/SNF/Other Home Patient oriented to: self, place, time and situation Is this baseline? Yes   Triage Complete: Triage complete  Chief Complaint Abd Pain  Triage Note Pt here for L pelvic pain. Was seen for same symptoms and had infection drained from her R ovary in July. Pt sts she was told she had an infection in both ovaries but only R side was drained. Pt here for increasing pain since yesterday morning. Pt had a colonoscopy at 12 noon today. Pt vomiting in triage. Pt sts they told her "something about some infection looking like it's somewhere."    Allergies Allergies  Allergen Reactions  . Gadolinium Derivatives Hives, Itching and Nausea And Vomiting    Pt vomited immediately during injection.  15 minutes later, pt developed hives and itching.   . Shellfish Allergy Hives  . Sulfa Antibiotics Other (See Comments)    Patient was told to NOT TAKE THIS  . Iohexol Hives and Rash    08-2018, rash and hives, needs pre meds S/W PATIENT STATED REACTION FROM CT CONTRAST 11/19    Level of Care/Admitting Diagnosis ED Disposition    ED Disposition Condition Comment   Admit  Hospital Area: [facility] [[id number]]  Level of Care: Med-Surg [16]  Covid Evaluation: Asymptomatic Screening Protocol (No Symptoms)  Diagnosis: Tubo-ovarian abscess [id number]  Admitting Physician: Doreatha Massed  Attending Physician: Etta Quill [phone]  Estimated length of stay: past midnight tomorrow  Certification:: I certify this patient will need inpatient services for at least 2 midnights  PT Class (Do Not Modify): Inpatient [101]  PT Acc Code (Do Not Modify): Private [1]       B Medical/Surgery History Past Medical History:  Diagnosis Date  . Anemia   . Back pain   . Constipation    . Endometriosis   . IBS (irritable bowel syndrome)   . Low hemoglobin   . Multiple food allergies   . PID (acute pelvic inflammatory disease) [id number]   complicated by tuboovarian abscess   Past Surgical History:  Procedure Laterality Date  . IR RADIOLOGIST EVAL & MGMT  05/19/2019  . IR RADIOLOGIST EVAL & MGMT  05/24/2019     A IV Location/Drains/Wounds Patient Lines/Drains/Airways Status   Active Line/Drains/Airways    Name:   Placement date:   Placement time:   Site:   Days:   Peripheral IV 07/20/19 Left Hand   07/20/19    1705    Hand   less than 1   Midline Single Lumen 05/10/19 Midline Left Cephalic 8 cm 0 cm   [id number]    [id number]    Cephalic   71   Closed System Drain 1 Right Abdomen Bulb (JP) 12 Fr.   05/10/19    1630    Abdomen   71          Intake/Output Last 24 hours  Intake/Output Summary (Last 24 hours) at 07/20/2019 2153 Last data filed at 07/20/2019 1941 Gross per 24 hour  Intake 1000 ml  Output -  Net 1000 ml    Labs/Imaging Results for orders placed or performed during the hospital encounter of 07/20/19 (from the past 48 hour(s))  Lipase, blood     Status: None   Collection Time: 07/20/19  4:34 PM  Result Value Ref  Range   Lipase 27 11 - 51 U/L    Comment: Performed at [facility] [address] [place], [place] [address]  Comprehensive metabolic panel     Status: Abnormal   Collection Time: 07/20/19  4:34 PM  Result Value Ref Range   Sodium 139 135 - 145 mmol/L   Potassium 3.5 3.5 - 5.1 mmol/L   Chloride 105 98 - 111 mmol/L   CO2 21 (L) 22 - 32 mmol/L   Glucose, Bld 119 (H) 70 - 99 mg/dL   BUN 8 6 - 20 mg/dL   Creatinine, Ser 1.00 0.44 - 1.00 mg/dL   Calcium 9.9 8.9 - 10.3 mg/dL   Total Protein 7.4 6.5 - 8.1 g/dL   Albumin 3.8 3.5 - 5.0 g/dL   AST 32 15 - 41 U/L   ALT 40 0 - 44 U/L   Alkaline Phosphatase 50 38 - 126 U/L   Total Bilirubin 0.4 0.3 - 1.2 mg/dL   GFR calc non Af Amer >60 >60 mL/min   GFR calc Af Amer >60 >60 mL/min   Anion  gap 13 5 - 15    Comment: Performed at [facility] [address], [place], [place] [address]  CBC     Status: Abnormal   Collection Time: 07/20/19  4:34 PM  Result Value Ref Range   WBC 16.9 (H) 4.0 - 10.5 K/uL   RBC 3.97 3.87 - 5.11 MIL/uL   Hemoglobin 11.2 (L) 12.0 - 15.0 g/dL   HCT 34.2 (L) 36.0 - 46.0 %   MCV 86.1 80.0 - 100.0 fL   MCH 28.2 26.0 - 34.0 pg   MCHC 32.7 30.0 - 36.0 g/dL   RDW 15.8 (H) 11.5 - 15.5 %   Platelets 472 (H) 150 - 400 K/uL   nRBC 0.0 0.0 - 0.2 %    Comment: Performed at [facility] [address], [place],  [address]  I-Stat beta hCG blood, ED     Status: None   Collection Time: 07/20/19  4:40 PM  Result Value Ref Range   I-stat hCG, quantitative <5.0 <5 mIU/mL   Comment 3            Comment:   GEST. AGE      CONC.  (mIU/mL)   <=1 WEEK        5 - 50     2 WEEKS       50 - 500     3 WEEKS       100 - 10,000     4 WEEKS     1,000 - 30,000        FEMALE AND NON-PREGNANT FEMALE:     LESS THAN 5 mIU/mL   Urinalysis, Routine w reflex microscopic     Status: Abnormal   Collection Time: 07/20/19  6:55 PM  Result Value Ref Range   Color, Urine YELLOW YELLOW   APPearance CLEAR CLEAR   Specific Gravity, Urine 1.008 1.005 - 1.030   pH 7.0 5.0 - 8.0   Glucose, UA NEGATIVE NEGATIVE mg/dL   Hgb urine dipstick SMALL (A) NEGATIVE   Bilirubin Urine NEGATIVE NEGATIVE   Ketones, ur NEGATIVE NEGATIVE mg/dL   Protein, ur NEGATIVE NEGATIVE mg/dL   Nitrite NEGATIVE NEGATIVE   Leukocytes,Ua NEGATIVE NEGATIVE   RBC / HPF 6-10 0 - 5 RBC/hpf   WBC, UA 0-5 0 - 5 WBC/hpf   Bacteria, UA RARE (A) NONE SEEN  Squamous Epithelial / LPF 0-5 0 - 5    Comment: Performed at [facility], [place] [address], [place], [place] [address]  Lactic acid, plasma     Status: None   Collection Time: 07/20/19  8:45 PM  Result Value Ref Range   Lactic Acid, Venous 1.2 0.5 - 1.9 mmol/L    Comment: Performed at [facility] [address], [place],   [address]   Ct Abdomen Pelvis Wo Contrast  Result Date: 07/20/2019 CLINICAL DATA:  Left pelvic pain. Severe. Colonoscopy today. Previous pelvic abscess, percutaneous drainage on the right. EXAM: CT ABDOMEN AND PELVIS WITHOUT CONTRAST TECHNIQUE: Multidetector CT imaging of the abdomen and pelvis was performed following the standard protocol without IV contrast. COMPARISON:  CT 05/19/2019 FINDINGS: Lower chest: Linear lingular and right lower lobe atelectasis. No confluent airspace disease. No pleural fluid. Hepatobiliary: No focal liver abnormality is seen. No gallstones, gallbladder wall thickening, or biliary dilatation. Pancreas: No ductal dilatation or inflammation. Spleen: Normal in size without focal abnormality. Small splenule inferiorly. Adrenals/Urinary Tract: Normal adrenal glands. No hydronephrosis or perinephric edema. No renal stones. Ureters are decompressed. Urinary bladder is minimally distended, mild wall thickening about the dome. Stomach/Bowel: Lack of IV and enteric contrast limits assessment. Stomach is nondistended. Small bowel is decompressed. Pelvic bowel loops are suboptimally assessed. Fluid throughout the colon with air-fluid levels. No obvious colonic wall thickening, sigmoid colon not well assessed. Irregular foci of air in the pelvis, difficult to confirm intraluminal bowel gas, for example image 81 series 3. Vascular/Lymphatic: Abdominal aorta is normal in caliber. Multiple prominent retroperitoneal lymph nodes which appears similar to prior exam under likely reactive. Reproductive: Intermediate density left adnexal lesion on prior exam persists, now with an air-fluid level, approximately 4.5 x 3.1 cm series 3, image 74. This may be contiguous with the right adnexa were there is irregular air and fluid, image 75 series 3. The uterus is poorly defined on the current exam. There is generalized fat stranding in the adnexa and pelvis. Other: Inflammatory process in the pelvis with fat  stranding and free fluid. Adnexal structures are not well-defined. No upper abdominal ascites. No fluid collection in the right pericolic gutter at site of prior percutaneous strain. Musculoskeletal: There are no acute or suspicious osseous abnormalities. Scattered bone islands in the pelvis. IMPRESSION: 1. Patient with history complex bilateral adnexal masses, previously characterized as PID, indeterminate density lesions in the adnexa persist, however now contain air and air-fluid levels, and are poorly defined. There is generalized fat stranding in the pelvis with inflammation and free fluid. Findings are concerning for progression of tubo-ovarian abscess. Recommend GYN consultation. 2. Lack of IV and enteric contrast as well as inflammatory change limits detailed assessment of pelvic structures, including pelvic bowel loops. There are questionable foci of extraluminal air in the pelvis, which may be related to the adnexal process. Given recent colonoscopy, bowel perforation is difficult to exclude on imaging findings alone, however there is no colonic wall thickening to suggest this. 3. Mild bladder wall thickening about the dome, likely reactive. 4. Prominent retroperitoneal lymph nodes are similar to prior exam under likely reactive. 5. No fluid collection in the right pericolic gutter at site of prior percutaneous strain. These results were called by telephone at the time of interpretation on 07/20/2019 at 7:57 pm to PA [facility] , who verbally acknowledged these results. Electronically Signed   By: Keith Rake M.D.   On: 07/20/2019 19:59    Pending  Labs [employer] (From admission, onward)    Start     Ordered   07/21/19 0500  CBC  Tomorrow morning,   R     07/20/19 2109   07/21/19 [id number]  Basic metabolic panel  Tomorrow morning,   R     07/20/19 2109   07/20/19 2123  MRSA PCR Screening  ONCE - STAT,   STAT     07/20/19 2122   07/20/19 2048  SARS CORONAVIRUS 2 (TAT 6-24 HRS)  Nasopharyngeal Nasopharyngeal Swab  (Asymptomatic/Tier 2 Patients Labs)  Once,   STAT    Question Answer Comment  Is this test for diagnosis or screening Screening   Symptomatic for COVID-19 as defined by CDC No   Hospitalized for COVID-19 No   Admitted to ICU for COVID-19 No   Previously tested for COVID-19 Yes   Resident in a congregate (group) care setting No   Employed in healthcare setting No   Pregnant No      07/20/19 2047   07/20/19 2030  Blood culture (routine x 2)  BLOOD CULTURE X 2,   STAT     07/20/19 2029          Vitals/Pain Today's Vitals   07/20/19 1945 07/20/19 2000 07/20/19 2009 07/20/19 2112  BP: 123/83 123/88    Pulse:  99    Resp:  19    Temp:      TempSrc:      SpO2:  98%    PainSc:   7  5     Isolation Precautions No active isolations  Medications Medications  ondansetron (ZOFRAN-ODT) disintegrating tablet 4 mg (has no administration in time range)  metroNIDAZOLE (FLAGYL) IVPB 500 mg (500 mg Intravenous New Bag/Given 07/20/19 2150)  piperacillin-tazobactam (ZOSYN) IVPB 3.375 g (0 g Intravenous Hold 07/20/19 2114)  0.9 %  sodium chloride infusion (has no administration in time range)  doxycycline (VIBRAMYCIN) 100 mg in sodium chloride 0.9 % 250 mL IVPB (has no administration in time range)  HYDROmorphone (DILAUDID) injection 1 mg (1 mg Intravenous Given 07/20/19 2147)  acetaminophen (TYLENOL) tablet 650 mg (has no administration in time range)    Or  acetaminophen (TYLENOL) suppository 650 mg (has no administration in time range)  ondansetron (ZOFRAN) tablet 4 mg ( Oral See Alternative 07/20/19 2147)    Or  ondansetron (ZOFRAN) injection 4 mg (4 mg Intravenous Given 07/20/19 2147)  sodium chloride flush (NS) 0.9 % injection 3 mL (3 mLs Intravenous Given 07/20/19 1712)  sodium chloride 0.9 % bolus 1,000 mL (0 mLs Intravenous Stopped 07/20/19 1941)  HYDROmorphone (DILAUDID) injection 2 mg (2 mg Intramuscular Given 07/20/19 1658)  ondansetron (ZOFRAN)  injection 4 mg (4 mg Intravenous Given 07/20/19 1709)  HYDROmorphone (DILAUDID) injection 0.5 mg (0.5 mg Intravenous Given 07/20/19 2017)    Mobility walks with device Moderate fall risk   Focused Assessments abdominal   R Recommendations: See Admitting Provider Note  Report given to:   Additional Notes:  Walks with cane

## 2019-07-20 NOTE — ED Provider Notes (Signed)
[facility] EMERGENCY DEPARTMENT Provider Note   CSN: [id number] Arrival date & time: 07/20/19  1617     History   Chief Complaint Chief Complaint  Patient presents with   Pelvic Pain    HPI Karah Shomper is a 33 y.o. female.     HPI  Patient with history of admission in July 2020 for tubo-ovarian and pericolic pelvic abscesses, small bowel obstruction, anemia --presents to the emergency department with severe left lower quadrant and pelvic pain.  Patient states that this pain has been ongoing and has been related to her ovarian cyst/pelvic infections.  She has been taking hydrocodone routinely for pain.  Also on doxy and flagyl for about a week.  Patient is followed by Dr. Garwin Brothers of [facility].  Patient had a colonoscopy today.  She had to stop taking her pain medications because of colonoscopy prep.  She states that her pain got worse yesterday but was severe after her colonoscopy today.  She states that this feels like the pain that she has been having over the past couple of months.  She reports associated vomiting.  Patient does not have a fever.  No reported bowel changes or urinary symptoms.  Past Medical History:  Diagnosis Date   Anemia    Back pain    Constipation    Endometriosis    IBS (irritable bowel syndrome)    Low hemoglobin    Multiple food allergies    PID (acute pelvic inflammatory disease) [id number]   complicated by tuboovarian abscess    Patient Active Problem List   Diagnosis Date Noted   Physical deconditioning 05/14/2019   PID (pelvic inflammatory disease) 05/14/2019   Small bowel obstruction (HCC)    Pelvic mass 05/02/2019   Elevated glucose 11/29/2018   Class 1 obesity with serious comorbidity and body mass index (BMI) of 30.0 to 30.9 in adult 11/29/2018    Past Surgical History:  Procedure Laterality Date   IR RADIOLOGIST EVAL & MGMT  05/19/2019   IR RADIOLOGIST EVAL & MGMT  05/24/2019     OB History     Gravida  0   Para  0   Term  0   Preterm  0   AB  0   Living  0     SAB  0   TAB  0   Ectopic  0   Multiple  0   Live Births  0            Home Medications    Prior to Admission medications   Medication Sig Start Date End Date Taking? Authorizing Provider  doxycycline (VIBRA-TABS) 100 MG tablet Take 100 mg by mouth 2 (two) times daily. 07/08/19  Yes [provider]  ferrous sulfate 325 (65 FE) MG tablet Take 325 mg by mouth daily with breakfast.   Yes [provider]  HYDROcodone-acetaminophen (NORCO) 5-325 MG tablet Take 1 tablet by mouth every 6 (six) hours as needed for moderate pain. 07/18/19  Yes Forrest Moron, MD  medroxyPROGESTERone (DEPO-PROVERA) 150 MG/ML injection Inject 150 mg into the muscle every 3 (three) months. 05/30/19  Yes [provider]  metroNIDAZOLE (FLAGYL) 500 MG tablet Take 500 mg by mouth 2 (two) times daily. 07/13/19  Yes [provider]  traMADol (ULTRAM) 50 MG tablet Take 1 tablet (50 mg total) by mouth every 8 (eight) hours as needed for up to 5 days for severe pain. 07/18/19 07/23/19 Yes Forrest Moron, MD  [name]  150-35 MCG/24HR transdermal patch Apply 1 patch topically every 28 (twenty-eight) days. 04/19/19  Yes [provider]    Family History Family History  Problem Relation Age of Onset   Hyperlipidemia Mother    Hypertension Mother    Obesity Mother    Hyperlipidemia Father    Hypertension Father    Obesity Father    Diabetes Maternal Grandmother     Social History Social History   Tobacco Use   Smoking status: Never Smoker   Smokeless tobacco: Never Used  Substance Use Topics   Alcohol use: Yes    Comment: occassional   Drug use: Not on file     Allergies   Gadolinium derivatives, Shellfish allergy, Sulfa antibiotics, and Iohexol   Review of Systems Review of Systems  Constitutional: Negative for fever.  HENT: Negative for rhinorrhea and sore  throat.   Eyes: Negative for redness.  Respiratory: Negative for cough and shortness of breath.   Cardiovascular: Negative for chest pain.  Gastrointestinal: Positive for abdominal pain, nausea and vomiting. Negative for diarrhea.  Genitourinary: Positive for pelvic pain. Negative for dysuria, vaginal bleeding and vaginal discharge.  Musculoskeletal: Negative for myalgias.  Skin: Negative for rash.  Neurological: Negative for headaches.     Physical Exam Updated Vital Signs BP (!) 154/104 (BP Location: Left Arm)    Pulse (!) 150    Temp 98.3 F (36.8 C) (Oral)    Resp 20    LMP 06/21/2019 Comment: pt on depo and has breakthrough bleeding   SpO2 100%   Physical Exam Vitals signs and nursing note reviewed.  Constitutional:      General: She is in acute distress.     Appearance: She is well-developed.     Comments: Patient appears very uncomfortable, crying.  HENT:     Head: Normocephalic and atraumatic.  Eyes:     General:        Right eye: No discharge.        Left eye: No discharge.     Conjunctiva/sclera: Conjunctivae normal.  Neck:     Musculoskeletal: Normal range of motion and neck supple.  Cardiovascular:     Rate and Rhythm: Normal rate and regular rhythm.     Heart sounds: Normal heart sounds.  Pulmonary:     Effort: Pulmonary effort is normal.     Breath sounds: Normal breath sounds.  Abdominal:     Palpations: Abdomen is soft.     Tenderness: There is abdominal tenderness in the suprapubic area and left lower quadrant. There is no guarding or rebound.  Skin:    General: Skin is warm and dry.  Neurological:     Mental Status: She is alert.      ED Treatments / Results  Labs (all labs ordered are listed, but only abnormal results are displayed) Labs Reviewed  COMPREHENSIVE METABOLIC PANEL - Abnormal; Notable for the following components:      Result Value   CO2 21 (*)    Glucose, Bld 119 (*)    All other components within normal limits  CBC - Abnormal;  Notable for the following components:   WBC 16.9 (*)    Hemoglobin 11.2 (*)    HCT 34.2 (*)    RDW 15.8 (*)    Platelets 472 (*)    All other components within normal limits  URINALYSIS, ROUTINE W REFLEX MICROSCOPIC - Abnormal; Notable for the following components:   Hgb urine dipstick SMALL (*)    Bacteria, UA RARE (*)  All other components within normal limits  CULTURE, BLOOD (ROUTINE X 2)  CULTURE, BLOOD (ROUTINE X 2)  SARS CORONAVIRUS 2 (TAT 6-24 HRS)  MRSA PCR SCREENING  LIPASE, BLOOD  LACTIC ACID, PLASMA  CBC  BASIC METABOLIC PANEL  I-STAT BETA HCG BLOOD, ED ([facility], [facility], [facility] ONLY)    EKG EKG Interpretation  Date/Time:  Wednesday July 20 2019 16:25:24 EDT Ventricular Rate:  156 PR Interval:    QRS Duration: 66 QT Interval:  324 QTC Calculation: 522 R Axis:   -86 Text Interpretation:  Supraventricular tachycardia Left axis deviation No previous tracing Confirmed by Blanchie Dessert [phone]) on 07/20/2019 4:55:05 PM   Radiology Ct Abdomen Pelvis Wo Contrast  Result Date: 07/20/2019 CLINICAL DATA:  Left pelvic pain. Severe. Colonoscopy today. Previous pelvic abscess, percutaneous drainage on the right. EXAM: CT ABDOMEN AND PELVIS WITHOUT CONTRAST TECHNIQUE: Multidetector CT imaging of the abdomen and pelvis was performed following the standard protocol without IV contrast. COMPARISON:  CT 05/19/2019 FINDINGS: Lower chest: Linear lingular and right lower lobe atelectasis. No confluent airspace disease. No pleural fluid. Hepatobiliary: No focal liver abnormality is seen. No gallstones, gallbladder wall thickening, or biliary dilatation. Pancreas: No ductal dilatation or inflammation. Spleen: Normal in size without focal abnormality. Small splenule inferiorly. Adrenals/Urinary Tract: Normal adrenal glands. No hydronephrosis or perinephric edema. No renal stones. Ureters are decompressed. Urinary bladder is minimally distended, mild wall thickening about the dome. Stomach/Bowel:  Lack of IV and enteric contrast limits assessment. Stomach is nondistended. Small bowel is decompressed. Pelvic bowel loops are suboptimally assessed. Fluid throughout the colon with air-fluid levels. No obvious colonic wall thickening, sigmoid colon not well assessed. Irregular foci of air in the pelvis, difficult to confirm intraluminal bowel gas, for example image 81 series 3. Vascular/Lymphatic: Abdominal aorta is normal in caliber. Multiple prominent retroperitoneal lymph nodes which appears similar to prior exam under likely reactive. Reproductive: Intermediate density left adnexal lesion on prior exam persists, now with an air-fluid level, approximately 4.5 x 3.1 cm series 3, image 74. This may be contiguous with the right adnexa were there is irregular air and fluid, image 75 series 3. The uterus is poorly defined on the current exam. There is generalized fat stranding in the adnexa and pelvis. Other: Inflammatory process in the pelvis with fat stranding and free fluid. Adnexal structures are not well-defined. No upper abdominal ascites. No fluid collection in the right pericolic gutter at site of prior percutaneous strain. Musculoskeletal: There are no acute or suspicious osseous abnormalities. Scattered bone islands in the pelvis. IMPRESSION: 1. Patient with history complex bilateral adnexal masses, previously characterized as PID, indeterminate density lesions in the adnexa persist, however now contain air and air-fluid levels, and are poorly defined. There is generalized fat stranding in the pelvis with inflammation and free fluid. Findings are concerning for progression of tubo-ovarian abscess. Recommend GYN consultation. 2. Lack of IV and enteric contrast as well as inflammatory change limits detailed assessment of pelvic structures, including pelvic bowel loops. There are questionable foci of extraluminal air in the pelvis, which may be related to the adnexal process. Given recent colonoscopy, bowel  perforation is difficult to exclude on imaging findings alone, however there is no colonic wall thickening to suggest this. 3. Mild bladder wall thickening about the dome, likely reactive. 4. Prominent retroperitoneal lymph nodes are similar to prior exam under likely reactive. 5. No fluid collection in the right pericolic gutter at site of prior percutaneous strain. These results were called by telephone at  the time of interpretation on 07/20/2019 at 7:57 pm to [facility] , who verbally acknowledged these results. Electronically Signed   By: Keith Rake M.D.   On: 07/20/2019 19:59    Procedures Procedures (including critical care time)  Medications Ordered in ED Medications  ondansetron (ZOFRAN-ODT) disintegrating tablet 4 mg (has no administration in time range)  metroNIDAZOLE (FLAGYL) IVPB 500 mg (has no administration in time range)  piperacillin-tazobactam (ZOSYN) IVPB 3.375 g (has no administration in time range)  sodium chloride flush (NS) 0.9 % injection 3 mL (3 mLs Intravenous Given 07/20/19 1712)  sodium chloride 0.9 % bolus 1,000 mL (0 mLs Intravenous Stopped 07/20/19 1941)  HYDROmorphone (DILAUDID) injection 2 mg (2 mg Intramuscular Given 07/20/19 1658)  ondansetron (ZOFRAN) injection 4 mg (4 mg Intravenous Given 07/20/19 1709)  HYDROmorphone (DILAUDID) injection 0.5 mg (0.5 mg Intravenous Given 07/20/19 2017)     Initial Impression / Assessment and Plan / ED Course  I have reviewed the triage vital signs and the nursing notes.  Pertinent labs & imaging results that were available during my care of the patient were reviewed by me and considered in my medical decision making (see chart for details).        Patient seen and examined. Work-up initiated. Medications ordered.  Difficulty obtaining peripheral IV.  Pain medication changed from IV to IM to get the patient more comfortable.  Elevated white blood cell count noted.  Will likely need imaging given the amount of her  pain, tachycardia, leukocytosis, recent procedure today.  Vital signs reviewed and are as follows: BP (!) 154/104 (BP Location: Left Arm)    Pulse (!) 150    Temp 98.3 F (36.8 C) (Oral)    Resp 20    LMP 06/21/2019 Comment: pt on depo and has breakthrough bleeding   SpO2 100%   5:21 PM Labs otherwise okay. Will proceed with non-contrast CT given allergy to contrast.   5:35 PM Patient re-examined.  She looks much more comfortable now.  Her heart rate is 110.  No peritonitis on exam.  8:13 PM Pain returning. Ordered more pain medication.  Discussed CT read with radiology.  I will need to discuss plan going forward with the patient's OB/GYN.    9:21 PM I spoke with Dr. Garwin Brothers of OB/GYN. They will consult in AM.  Request admission for IV antibiotics.    Spoke with Dr. Alcario Drought who will admit. Requests gen surgery involvement for ? Perforation.   Spoke with Dr. Dema Severin. They will see and follow.   Final Clinical Impressions(s) / ED Diagnoses   Final diagnoses:  Tubo-ovarian abscess   Admit.   ED Discharge Orders    None       Carlisle Cater, Hershal Coria 07/20/19 2125    Blanchie Dessert, MD 07/22/19 1622

## 2019-07-20 NOTE — Progress Notes (Signed)
Pharmacy Antibiotic Note  Gwendolyn Nguyen is a 33 y.o. female admitted on 07/20/2019 with left pelvic pain.  Patient recently had drainage from her right ovary in July. Pharmacy has been consulted for Zosyn dosing for intra-abdominal infection.  SCr 1, CrCL 80 ml/min, afebrile, WBC 16.9  Plan: Zosyn EID 3.375gm IV Q8H Pharmacy will sign off as dosage adjustment is likely unnecessary.  Thank you for the consult!    Temp (24hrs), Avg:98.3 F (36.8 C), Min:98.3 F (36.8 C), Max:98.3 F (36.8 C)  Recent Labs  Lab 07/20/19 1634  WBC 16.9*  CREATININE 1.00    Estimated Creatinine Clearance: 80.7 mL/min (by C-G formula based on SCr of 1 mg/dL).    Allergies  Allergen Reactions  . Gadolinium Derivatives Hives, Itching and Nausea And Vomiting    Pt vomited immediately during injection.  15 minutes later, pt developed hives and itching.   . Shellfish Allergy Hives  . Sulfa Antibiotics Other (See Comments)    Patient was told to NOT TAKE THIS  . Iohexol Hives and Rash    08-2018, rash and hives, needs pre meds S/W PATIENT STATED REACTION FROM CT CONTRAST 11/19    Gwendolyn Nguyen D. Mina Marble, PharmD, BCPS, [place] 07/20/2019, 8:43 PM

## 2019-07-20 NOTE — ED Notes (Signed)
Pt in fetal position; requesting more pain medications.

## 2019-07-20 NOTE — ED Triage Notes (Signed)
Pt here for L pelvic pain. Was seen for same symptoms and had infection drained from her R ovary in July. Pt sts she was told she had an infection in both ovaries but only R side was drained. Pt here for increasing pain since yesterday morning. Pt had a colonoscopy at 12 noon today. Pt vomiting in triage. Pt sts they told her "something about some infection looking like it's somewhere."

## 2019-07-20 NOTE — H&P (Signed)
History and Physical    Gwendolyn Nguyen [id number] DOB: [birth year] DOA: 07/20/2019  PCP: Forrest Moron, MD  Patient coming from: Home  I have personally briefly reviewed patient's old medical records in [place]  Chief Complaint: Abd pain  HPI: Gwendolyn Nguyen is a 33 y.o. female with medical history significant of PID with complicated B TOAs, s/p IV ABx and perc drainage back in July of this year, also had SBO during that admit.  Cultures with no growth from that time.  Does have h/o gonorrhea in past couple of years ago.  Recently having progressively worsening pelvic pain.  Intermittent.  Continue to worsen despite being on doxy/flagyl for the past week or so.  Some concern for possible GI process so colonoscopy was done today, no results available.  As part of bowel prep she had held her home pain meds, woke up with severe pain post colonoscopy though this was same pain and location she had been having.  Associated N/V.  No diarrhea, no measured fevers.  No dysuria.   ED Course: Initially in severe pain with tachycardia to 160s, improved to NSR after pain controlled with dilaudid.  CT non-contrast (IV dye allergy) reveals: 1) worsening / progressive B TOAs 2) ? Intraluminal air foci, no obvious site of perf or bowel inflammation but cant rule out post colonoscopy perf on CT.  Given zosyn / flagyl in ED.   Review of Systems: As per HPI, otherwise all review of systems negative.  Past Medical History:  Diagnosis Date  . Anemia   . Back pain   . Constipation   . Endometriosis   . IBS (irritable bowel syndrome)   . Low hemoglobin   . Multiple food allergies   . PID (acute pelvic inflammatory disease) [id number]   complicated by tuboovarian abscess    Past Surgical History:  Procedure Laterality Date  . IR RADIOLOGIST EVAL & MGMT  05/19/2019  . IR RADIOLOGIST EVAL & MGMT  05/24/2019     reports that she has never smoked. She has never used smokeless  tobacco. She reports current alcohol use. No history on file for drug.  Allergies  Allergen Reactions  . Gadolinium Derivatives Hives, Itching and Nausea And Vomiting    Pt vomited immediately during injection.  15 minutes later, pt developed hives and itching.   . Shellfish Allergy Hives  . Sulfa Antibiotics Other (See Comments)    Patient was told to NOT TAKE THIS  . Iohexol Hives and Rash    08-2018, rash and hives, needs pre meds S/W PATIENT STATED REACTION FROM CT CONTRAST 11/19    Family History  Problem Relation Age of Onset  . Hyperlipidemia Mother   . Hypertension Mother   . Obesity Mother   . Hyperlipidemia Father   . Hypertension Father   . Obesity Father   . Diabetes Maternal Grandmother      Prior to Admission medications   Medication Sig Start Date End Date Taking? Authorizing Provider  doxycycline (VIBRA-TABS) 100 MG tablet Take 100 mg by mouth 2 (two) times daily. 07/08/19  Yes [provider]  ferrous sulfate 325 (65 FE) MG tablet Take 325 mg by mouth daily with breakfast.   Yes [provider]  HYDROcodone-acetaminophen (NORCO) 5-325 MG tablet Take 1 tablet by mouth every 6 (six) hours as needed for moderate pain. 07/18/19  Yes Forrest Moron, MD  medroxyPROGESTERone (DEPO-PROVERA) 150 MG/ML injection Inject 150 mg into the muscle every 3 (three) months. 05/30/19  Yes [provider]  metroNIDAZOLE (FLAGYL) 500 MG tablet Take 500 mg by mouth 2 (two) times daily. 07/13/19  Yes [provider]  traMADol (ULTRAM) 50 MG tablet Take 1 tablet (50 mg total) by mouth every 8 (eight) hours as needed for up to 5 days for severe pain. 07/18/19 07/23/19 Yes Forrest Moron, MD  [name] 150-35 MCG/24HR transdermal patch Apply 1 patch topically every 28 (twenty-eight) days. 04/19/19  Yes [provider]    Physical Exam: Vitals:   07/20/19 1915 07/20/19 1930 07/20/19 1945 07/20/19 2000  BP: 115/74 119/74 123/83 123/88  Pulse: 99   99   Resp:    19  Temp:      TempSrc:      SpO2: 97%   98%    Constitutional: NAD, calm, comfortable Eyes: PERRL, lids and conjunctivae normal ENMT: Mucous membranes are moist. Posterior pharynx clear of any exudate or lesions.Normal dentition.  Neck: normal, supple, no masses, no thyromegaly Respiratory: clear to auscultation bilaterally, no wheezing, no crackles. Normal respiratory effort. No accessory muscle use.  Cardiovascular: Regular rate and rhythm, no murmurs / rubs / gallops. No extremity edema. 2+ pedal pulses. No carotid bruits.  Abdomen: Some pelvic tenderness, no guarding, no rebound Musculoskeletal: no clubbing / cyanosis. No joint deformity upper and lower extremities. Good ROM, no contractures. Normal muscle tone.  Skin: no rashes, lesions, ulcers. No induration Neurologic: CN 2-12 grossly intact. Sensation intact, DTR normal. Strength 5/5 in all 4.  Psychiatric: Normal judgment and insight. Alert and oriented x 3. Normal mood.    Labs on Admission: I have personally reviewed following labs and imaging studies  CBC: Recent Labs  Lab 07/20/19 1634  WBC 16.9*  HGB 11.2*  HCT 34.2*  MCV 86.1  PLT 99991111*   Basic Metabolic Panel: Recent Labs  Lab 07/20/19 1634  NA 139  K 3.5  CL 105  CO2 21*  GLUCOSE 119*  BUN 8  CREATININE 1.00  CALCIUM 9.9   GFR: Estimated Creatinine Clearance: 80.7 mL/min (by C-G formula based on SCr of 1 mg/dL). Liver Function Tests: Recent Labs  Lab 07/20/19 1634  AST 32  ALT 40  ALKPHOS 50  BILITOT 0.4  PROT 7.4  ALBUMIN 3.8   Recent Labs  Lab 07/20/19 1634  LIPASE 27   No results for input(s): AMMONIA in the last 168 hours. Coagulation Profile: No results for input(s): INR, PROTIME in the last 168 hours. Cardiac Enzymes: No results for input(s): CKTOTAL, CKMB, CKMBINDEX, TROPONINI in the last 168 hours. BNP (last 3 results) No results for input(s): PROBNP in the last 8760 hours. HbA1C: No results for input(s): HGBA1C  in the last 72 hours. CBG: No results for input(s): GLUCAP in the last 168 hours. Lipid Profile: No results for input(s): CHOL, HDL, LDLCALC, TRIG, CHOLHDL, LDLDIRECT in the last 72 hours. Thyroid Function Tests: No results for input(s): TSH, T4TOTAL, FREET4, T3FREE, THYROIDAB in the last 72 hours. Anemia Panel: No results for input(s): VITAMINB12, FOLATE, FERRITIN, TIBC, IRON, RETICCTPCT in the last 72 hours. Urine analysis:    Component Value Date/Time   COLORURINE YELLOW 07/20/2019 1855   APPEARANCEUR CLEAR 07/20/2019 1855   LABSPEC 1.008 07/20/2019 1855   PHURINE 7.0 07/20/2019 1855   GLUCOSEU NEGATIVE 07/20/2019 1855   HGBUR SMALL (A) 07/20/2019 1855   BILIRUBINUR NEGATIVE 07/20/2019 1855   KETONESUR NEGATIVE 07/20/2019 1855   PROTEINUR NEGATIVE 07/20/2019 1855   UROBILINOGEN 0.2 07/30/2018 1859   NITRITE NEGATIVE 07/20/2019 1855  LEUKOCYTESUR NEGATIVE 07/20/2019 1855    Radiological Exams on Admission: Ct Abdomen Pelvis Wo Contrast  Result Date: 07/20/2019 CLINICAL DATA:  Left pelvic pain. Severe. Colonoscopy today. Previous pelvic abscess, percutaneous drainage on the right. EXAM: CT ABDOMEN AND PELVIS WITHOUT CONTRAST TECHNIQUE: Multidetector CT imaging of the abdomen and pelvis was performed following the standard protocol without IV contrast. COMPARISON:  CT 05/19/2019 FINDINGS: Lower chest: Linear lingular and right lower lobe atelectasis. No confluent airspace disease. No pleural fluid. Hepatobiliary: No focal liver abnormality is seen. No gallstones, gallbladder wall thickening, or biliary dilatation. Pancreas: No ductal dilatation or inflammation. Spleen: Normal in size without focal abnormality. Small splenule inferiorly. Adrenals/Urinary Tract: Normal adrenal glands. No hydronephrosis or perinephric edema. No renal stones. Ureters are decompressed. Urinary bladder is minimally distended, mild wall thickening about the dome. Stomach/Bowel: Lack of IV and enteric contrast  limits assessment. Stomach is nondistended. Small bowel is decompressed. Pelvic bowel loops are suboptimally assessed. Fluid throughout the colon with air-fluid levels. No obvious colonic wall thickening, sigmoid colon not well assessed. Irregular foci of air in the pelvis, difficult to confirm intraluminal bowel gas, for example image 81 series 3. Vascular/Lymphatic: Abdominal aorta is normal in caliber. Multiple prominent retroperitoneal lymph nodes which appears similar to prior exam under likely reactive. Reproductive: Intermediate density left adnexal lesion on prior exam persists, now with an air-fluid level, approximately 4.5 x 3.1 cm series 3, image 74. This may be contiguous with the right adnexa were there is irregular air and fluid, image 75 series 3. The uterus is poorly defined on the current exam. There is generalized fat stranding in the adnexa and pelvis. Other: Inflammatory process in the pelvis with fat stranding and free fluid. Adnexal structures are not well-defined. No upper abdominal ascites. No fluid collection in the right pericolic gutter at site of prior percutaneous strain. Musculoskeletal: There are no acute or suspicious osseous abnormalities. Scattered bone islands in the pelvis. IMPRESSION: 1. Patient with history complex bilateral adnexal masses, previously characterized as PID, indeterminate density lesions in the adnexa persist, however now contain air and air-fluid levels, and are poorly defined. There is generalized fat stranding in the pelvis with inflammation and free fluid. Findings are concerning for progression of tubo-ovarian abscess. Recommend GYN consultation. 2. Lack of IV and enteric contrast as well as inflammatory change limits detailed assessment of pelvic structures, including pelvic bowel loops. There are questionable foci of extraluminal air in the pelvis, which may be related to the adnexal process. Given recent colonoscopy, bowel perforation is difficult to  exclude on imaging findings alone, however there is no colonic wall thickening to suggest this. 3. Mild bladder wall thickening about the dome, likely reactive. 4. Prominent retroperitoneal lymph nodes are similar to prior exam under likely reactive. 5. No fluid collection in the right pericolic gutter at site of prior percutaneous strain. These results were called by telephone at the time of interpretation on 07/20/2019 at 7:57 pm to PA [facility] , who verbally acknowledged these results. Electronically Signed   By: Keith Rake M.D.   On: 07/20/2019 19:59    EKG: Independently reviewed.  Assessment/Plan Principal Problem:   Tubo-ovarian abscess Active Problems:   Abdominal pain    1. TOA - 1. Progressive at this point despite abx including IV abx in July with perc drainage, also failed outpt ABx this past week. 2. Cultures neg in July. 3. Will put on zosyn / doxy for now 4. Check MRSA PCR nares (though up to  date doesn't say anything about needing MRSA coverage for TOAs in their ABx recommendations). 5. But suspect this ultimately may end up being a surgical solution given the HPI. 6. GYN to see in AM 7. IVF: 100 cc/hr NS 8. Will make NPO after MN 9. Pain ctrl with dilaudid PRN. 2. Abd pain - 1. Likely due to TOA 2. Had gen surgery see her here in ED, they do NOT think that patient has post colonoscopy perforation.  DVT prophylaxis: SCDs - due to possible need for surgery for TOAs Code Status: Full Family Communication: No family in room Disposition Plan: Home after admit. Consults called: Dr. Garwin Brothers: GYN to see in AM, Dr. Dema Severin saw pt in ED for gen surg. Admission status: Admit to inpatient  Severity of Illness: The appropriate patient status for this patient is INPATIENT. Inpatient status is judged to be reasonable and necessary in order to provide the required intensity of service to ensure the patient's safety. The patient's presenting symptoms, physical exam findings,  and initial radiographic and laboratory data in the context of their chronic comorbidities is felt to place them at high risk for further clinical deterioration. Furthermore, it is not anticipated that the patient will be medically stable for discharge from the hospital within 2 midnights of admission. The following factors support the patient status of inpatient.   IP status due to progressive TOAs despite outpt antibiotics.  Actually already failed IP IV abx and IR drainage too back in July.  Therefore not only does she need IV ABx (at a minimum) but I suspect she may need surgical intervention.   * I certify that at the point of admission it is my clinical judgment that the patient will require inpatient hospital care spanning beyond 2 midnights from the point of admission due to high intensity of service, high risk for further deterioration and high frequency of surveillance required.*    Cougar Imel M. DO [facility]  How to contact the [facility] Attending or Consulting provider [place] or covering provider during after hours [place], for this patient?  1. Check the care team in [facility] and look for a) attending/consulting [facility] provider listed and b) the [facility] team listed 2. Log into [email or web address]  Amion Physician Scheduling and messaging for groups and whole hospitals  On call and physician scheduling software for group practices, residents, hospitalists and other medical providers for call, clinic, rotation and shift schedules. On[employer] is a hospital-wide system for scheduling doctors and paging doctors on call. EasyPlot is for scientific plotting and data analysis.  [email or web address]  and use [place]'s universal password to access. If you do not have the password, please contact the hospital operator.  3. Locate the [facility] provider you are looking for under [facility] and page to a number that you can be directly reached. 4. If you still have difficulty reaching the provider,  please page the [facility] (Director on Call) for the Hospitalists listed on amion for assistance.  07/20/2019, 9:36 PM

## 2019-07-20 NOTE — ED Notes (Signed)
Attempted to call report, nurse unable to take at this time and will call me back.

## 2019-07-20 NOTE — Consult Note (Addendum)
CC: Consult by Dr. Alcario Drought for recurrent/progressive pelvic pain; colonoscopy today  HPI: Gwendolyn Nguyen is an 33 y.o. female with hx of endometriosis, PID, recently managed here for tubo-ovarian abscesses vs endometriomas with both [place] and gynecology following her case. She was admitted 05/03/2019 for this with presumed reactive ileus.  We were following for her reactive ileus.  This ultimately improved with bowel rest and IV antibiotics.  Her NG tube was removed and she improved.  As for her pelvic masses, we were deferring to gynecology.  They were treating her for complex PID.  She ultimately underwent pelvic MRI 05/08/2019 which showed complex appearance with bilateral serpentine and cystic adnexal lesions representing pyosalpinx bilaterally as well as scattered collections with thick enhancing margins along the lower paracolic gutters, perirectal space, and above the urinary bladder suspicious for abscesses.  Surgery had signed off but it appears she had undergone CT-guided drainage of these collections.  Subsequent injection of the drains demonstrated no evidence of a fistula.  Repeat CT abdomen pelvis 05/20/2019 demonstrated improved findings with resolved abscesses.  There was an indeterminate cyst/lesion in the left adnexa that was 5 cm.  She reports that for the past few weeks she has had persistent pelvic pain.  Over the last 7 days it has become more progressive in nature similar to the first time she was admitted back in July.   Throughout all of her work-up, she was referred for colonoscopy which she had done today with Dr. Collene Mares.  She has had persistent abdominal pain for the last week which she does not believe has acutely worsened relative to prior to her colonoscopy.  She states that her abdominal discomfort and pelvic discomfort is identical to the pain she had before her scope today.  She denies any fever/chills and has been checking her temperature at home.  She denies any nausea or  vomiting.  Currently, she reports feeling much better than when she came to the emergency room   Past Medical History:  Diagnosis Date   Anemia    Back pain    Constipation    Endometriosis    IBS (irritable bowel syndrome)    Low hemoglobin    Multiple food allergies    PID (acute pelvic inflammatory disease) [id number]   complicated by tuboovarian abscess    Past Surgical History:  Procedure Laterality Date   IR RADIOLOGIST EVAL & MGMT  05/19/2019   IR RADIOLOGIST EVAL & MGMT  05/24/2019    Family History  Problem Relation Age of Onset   Hyperlipidemia Mother    Hypertension Mother    Obesity Mother    Hyperlipidemia Father    Hypertension Father    Obesity Father    Diabetes Maternal Grandmother     Social:  reports that she has never smoked. She has never used smokeless tobacco. She reports current alcohol use. No history on file for drug.  Allergies:  Allergies  Allergen Reactions   Gadolinium Derivatives Hives, Itching and Nausea And Vomiting    Pt vomited immediately during injection.  15 minutes later, pt developed hives and itching.    Shellfish Allergy Hives   Sulfa Antibiotics Other (See Comments)    Patient was told to NOT TAKE THIS   Iohexol Hives and Rash    08-2018, rash and hives, needs pre meds S/W PATIENT STATED REACTION FROM CT CONTRAST 11/19    Medications: I have reviewed the patient's current medications.  Results for orders placed or performed during the hospital encounter  of 07/20/19 (from the past 48 hour(s))  Lipase, blood     Status: None   Collection Time: 07/20/19  4:34 PM  Result Value Ref Range   Lipase 27 11 - 51 U/L    Comment: Performed at [facility], [address] [place], [place] [address]  Comprehensive metabolic panel     Status: Abnormal   Collection Time: 07/20/19  4:34 PM  Result Value Ref Range   Sodium 139 135 - 145 mmol/L   Potassium 3.5 3.5 - 5.1 mmol/L   Chloride 105 98 - 111 mmol/L    CO2 21 (L) 22 - 32 mmol/L   Glucose, Bld 119 (H) 70 - 99 mg/dL   BUN 8 6 - 20 mg/dL   Creatinine, Ser 1.00 0.44 - 1.00 mg/dL   Calcium 9.9 8.9 - 10.3 mg/dL   Total Protein 7.4 6.5 - 8.1 g/dL   Albumin 3.8 3.5 - 5.0 g/dL   AST 32 15 - 41 U/L   ALT 40 0 - 44 U/L   Alkaline Phosphatase 50 38 - 126 U/L   Total Bilirubin 0.4 0.3 - 1.2 mg/dL   GFR calc non Af Amer >60 >60 mL/min   GFR calc Af Amer >60 >60 mL/min   Anion gap 13 5 - 15    Comment: Performed at [facility] [address] [place], [place] [address]  CBC     Status: Abnormal   Collection Time: 07/20/19  4:34 PM  Result Value Ref Range   WBC 16.9 (H) 4.0 - 10.5 K/uL   RBC 3.97 3.87 - 5.11 MIL/uL   Hemoglobin 11.2 (L) 12.0 - 15.0 g/dL   HCT 34.2 (L) 36.0 - 46.0 %   MCV 86.1 80.0 - 100.0 fL   MCH 28.2 26.0 - 34.0 pg   MCHC 32.7 30.0 - 36.0 g/dL   RDW 15.8 (H) 11.5 - 15.5 %   Platelets 472 (H) 150 - 400 K/uL   nRBC 0.0 0.0 - 0.2 %    Comment: Performed at [facility] [address] [place], [place] [address]  I-Stat beta hCG blood, ED     Status: None   Collection Time: 07/20/19  4:40 PM  Result Value Ref Range   I-stat hCG, quantitative <5.0 <5 mIU/mL   Comment 3            Comment:   GEST. AGE      CONC.  (mIU/mL)   <=1 WEEK        5 - 50     2 WEEKS       50 - 500     3 WEEKS       100 - 10,000     4 WEEKS     1,000 - 30,000        FEMALE AND NON-PREGNANT FEMALE:     LESS THAN 5 mIU/mL   Urinalysis, Routine w reflex microscopic     Status: Abnormal   Collection Time: 07/20/19  6:55 PM  Result Value Ref Range   Color, Urine YELLOW YELLOW   APPearance CLEAR CLEAR   Specific Gravity, Urine 1.008 1.005 - 1.030   pH 7.0 5.0 - 8.0   Glucose, UA NEGATIVE NEGATIVE mg/dL   Hgb urine dipstick SMALL (A) NEGATIVE   Bilirubin Urine NEGATIVE NEGATIVE   Ketones, ur NEGATIVE NEGATIVE mg/dL   Protein, ur NEGATIVE NEGATIVE mg/dL   Nitrite NEGATIVE NEGATIVE   Leukocytes,Ua NEGATIVE NEGATIVE  RBC / HPF 6-10  0 - 5 RBC/hpf   WBC, UA 0-5 0 - 5 WBC/hpf   Bacteria, UA RARE (A) NONE SEEN   Squamous Epithelial / LPF 0-5 0 - 5    Comment: Performed at [facility], [place] [address] [place], [place] [address]  Lactic acid, plasma     Status: None   Collection Time: 07/20/19  8:45 PM  Result Value Ref Range   Lactic Acid, Venous 1.2 0.5 - 1.9 mmol/L    Comment: Performed at [facility] [address], [place], [place] [address]    Ct Abdomen Pelvis Wo Contrast  Result Date: 07/20/2019 CLINICAL DATA:  Left pelvic pain. Severe. Colonoscopy today. Previous pelvic abscess, percutaneous drainage on the right. EXAM: CT ABDOMEN AND PELVIS WITHOUT CONTRAST TECHNIQUE: Multidetector CT imaging of the abdomen and pelvis was performed following the standard protocol without IV contrast. COMPARISON:  CT 05/19/2019 FINDINGS: Lower chest: Linear lingular and right lower lobe atelectasis. No confluent airspace disease. No pleural fluid. Hepatobiliary: No focal liver abnormality is seen. No gallstones, gallbladder wall thickening, or biliary dilatation. Pancreas: No ductal dilatation or inflammation. Spleen: Normal in size without focal abnormality. Small splenule inferiorly. Adrenals/Urinary Tract: Normal adrenal glands. No hydronephrosis or perinephric edema. No renal stones. Ureters are decompressed. Urinary bladder is minimally distended, mild wall thickening about the dome. Stomach/Bowel: Lack of IV and enteric contrast limits assessment. Stomach is nondistended. Small bowel is decompressed. Pelvic bowel loops are suboptimally assessed. Fluid throughout the colon with air-fluid levels. No obvious colonic wall thickening, sigmoid colon not well assessed. Irregular foci of air in the pelvis, difficult to confirm intraluminal bowel gas, for example image 81 series 3. Vascular/Lymphatic: Abdominal aorta is normal in caliber. Multiple prominent retroperitoneal lymph nodes which appears similar to prior exam under  likely reactive. Reproductive: Intermediate density left adnexal lesion on prior exam persists, now with an air-fluid level, approximately 4.5 x 3.1 cm series 3, image 74. This may be contiguous with the right adnexa were there is irregular air and fluid, image 75 series 3. The uterus is poorly defined on the current exam. There is generalized fat stranding in the adnexa and pelvis. Other: Inflammatory process in the pelvis with fat stranding and free fluid. Adnexal structures are not well-defined. No upper abdominal ascites. No fluid collection in the right pericolic gutter at site of prior percutaneous strain. Musculoskeletal: There are no acute or suspicious osseous abnormalities. Scattered bone islands in the pelvis. IMPRESSION: 1. Patient with history complex bilateral adnexal masses, previously characterized as PID, indeterminate density lesions in the adnexa persist, however now contain air and air-fluid levels, and are poorly defined. There is generalized fat stranding in the pelvis with inflammation and free fluid. Findings are concerning for progression of tubo-ovarian abscess. Recommend GYN consultation. 2. Lack of IV and enteric contrast as well as inflammatory change limits detailed assessment of pelvic structures, including pelvic bowel loops. There are questionable foci of extraluminal air in the pelvis, which may be related to the adnexal process. Given recent colonoscopy, bowel perforation is difficult to exclude on imaging findings alone, however there is no colonic wall thickening to suggest this. 3. Mild bladder wall thickening about the dome, likely reactive. 4. Prominent retroperitoneal lymph nodes are similar to prior exam under likely reactive. 5. No fluid collection in the right pericolic gutter at site of prior percutaneous strain. These results were called by telephone at the time of interpretation on 07/20/2019 at 7:57 pm  to PA [facility] , who verbally acknowledged these results.  Electronically Signed   By: Keith Rake M.D.   On: 07/20/2019 19:59    ROS - all of the below systems have been reviewed with the patient and positives are indicated with bold text General: chills, fever or night sweats Eyes: blurry vision or double vision ENT: epistaxis or sore throat Allergy/Immunology: itchy/watery eyes or nasal congestion Hematologic/Lymphatic: bleeding problems, blood clots or swollen lymph nodes Endocrine: temperature intolerance or unexpected weight changes Breast: new or changing breast lumps or nipple discharge Resp: cough, shortness of breath, or wheezing CV: chest pain or dyspnea on exertion GI: as per HPI GU: dysuria, trouble voiding, or hematuria MSK: joint pain or joint stiffness Neuro: TIA or stroke symptoms Derm: pruritus and skin lesion changes Psych: anxiety and depression  PE Blood pressure 123/88, pulse 99, temperature 98.3 F (36.8 C), temperature source Oral, resp. rate 19, last menstrual period 06/21/2019, SpO2 98 %. Constitutional: NAD; conversant; no deformities Eyes: Moist conjunctiva; no lid lag; anicteric; PERRL Neck: Trachea midline; no thyromegaly Lungs: Normal respiratory effort; no tactile fremitus CV: RRR; no palpable thrills; no pitting edema GI: Abd soft, minimally ttp in hypogastrium; no tenderness elsewhere; nondistended; no rebound; no guarding; no palpable hepatosplenomegaly MSK: Normal gait; no clubbing/cyanosis Psychiatric: Appropriate affect; alert and oriented x3 Lymphatic: No palpable cervical or axillary lymphadenopathy  Results for orders placed or performed during the hospital encounter of 07/20/19 (from the past 48 hour(s))  Lipase, blood     Status: None   Collection Time: 07/20/19  4:34 PM  Result Value Ref Range   Lipase 27 11 - 51 U/L    Comment: Performed at [facility], [place] [address], [place], [place] [address]  Comprehensive metabolic panel     Status: Abnormal   Collection Time: 07/20/19   4:34 PM  Result Value Ref Range   Sodium 139 135 - 145 mmol/L   Potassium 3.5 3.5 - 5.1 mmol/L   Chloride 105 98 - 111 mmol/L   CO2 21 (L) 22 - 32 mmol/L   Glucose, Bld 119 (H) 70 - 99 mg/dL   BUN 8 6 - 20 mg/dL   Creatinine, Ser 1.00 0.44 - 1.00 mg/dL   Calcium 9.9 8.9 - 10.3 mg/dL   Total Protein 7.4 6.5 - 8.1 g/dL   Albumin 3.8 3.5 - 5.0 g/dL   AST 32 15 - 41 U/L   ALT 40 0 - 44 U/L   Alkaline Phosphatase 50 38 - 126 U/L   Total Bilirubin 0.4 0.3 - 1.2 mg/dL   GFR calc non Af Amer >60 >60 mL/min   GFR calc Af Amer >60 >60 mL/min   Anion gap 13 5 - 15    Comment: Performed at [facility] [address] [place], [place] [address]  CBC     Status: Abnormal   Collection Time: 07/20/19  4:34 PM  Result Value Ref Range   WBC 16.9 (H) 4.0 - 10.5 K/uL   RBC 3.97 3.87 - 5.11 MIL/uL   Hemoglobin 11.2 (L) 12.0 - 15.0 g/dL   HCT 34.2 (L) 36.0 - 46.0 %   MCV 86.1 80.0 - 100.0 fL   MCH 28.2 26.0 - 34.0 pg   MCHC 32.7 30.0 - 36.0 g/dL   RDW 15.8 (H) 11.5 - 15.5 %   Platelets 472 (H) 150 - 400 K/uL   nRBC 0.0 0.0 - 0.2 %    Comment: Performed at  [facility], [place] [address], [place], [place] [address]  I-Stat beta hCG blood, ED     Status: None   Collection Time: 07/20/19  4:40 PM  Result Value Ref Range   I-stat hCG, quantitative <5.0 <5 mIU/mL   Comment 3            Comment:   GEST. AGE      CONC.  (mIU/mL)   <=1 WEEK        5 - 50     2 WEEKS       50 - 500     3 WEEKS       100 - 10,000     4 WEEKS     1,000 - 30,000        FEMALE AND NON-PREGNANT FEMALE:     LESS THAN 5 mIU/mL   Urinalysis, Routine w reflex microscopic     Status: Abnormal   Collection Time: 07/20/19  6:55 PM  Result Value Ref Range   Color, Urine YELLOW YELLOW   APPearance CLEAR CLEAR   Specific Gravity, Urine 1.008 1.005 - 1.030   pH 7.0 5.0 - 8.0   Glucose, UA NEGATIVE NEGATIVE mg/dL   Hgb urine dipstick SMALL (A) NEGATIVE   Bilirubin Urine NEGATIVE NEGATIVE   Ketones, ur NEGATIVE  NEGATIVE mg/dL   Protein, ur NEGATIVE NEGATIVE mg/dL   Nitrite NEGATIVE NEGATIVE   Leukocytes,Ua NEGATIVE NEGATIVE   RBC / HPF 6-10 0 - 5 RBC/hpf   WBC, UA 0-5 0 - 5 WBC/hpf   Bacteria, UA RARE (A) NONE SEEN   Squamous Epithelial / LPF 0-5 0 - 5    Comment: Performed at [facility], [place] [address] [place], [place] [address]  Lactic acid, plasma     Status: None   Collection Time: 07/20/19  8:45 PM  Result Value Ref Range   Lactic Acid, Venous 1.2 0.5 - 1.9 mmol/L    Comment: Performed at [facility] [address] [place], [place] [address]    Ct Abdomen Pelvis Wo Contrast  Result Date: 07/20/2019 CLINICAL DATA:  Left pelvic pain. Severe. Colonoscopy today. Previous pelvic abscess, percutaneous drainage on the right. EXAM: CT ABDOMEN AND PELVIS WITHOUT CONTRAST TECHNIQUE: Multidetector CT imaging of the abdomen and pelvis was performed following the standard protocol without IV contrast. COMPARISON:  CT 05/19/2019 FINDINGS: Lower chest: Linear lingular and right lower lobe atelectasis. No confluent airspace disease. No pleural fluid. Hepatobiliary: No focal liver abnormality is seen. No gallstones, gallbladder wall thickening, or biliary dilatation. Pancreas: No ductal dilatation or inflammation. Spleen: Normal in size without focal abnormality. Small splenule inferiorly. Adrenals/Urinary Tract: Normal adrenal glands. No hydronephrosis or perinephric edema. No renal stones. Ureters are decompressed. Urinary bladder is minimally distended, mild wall thickening about the dome. Stomach/Bowel: Lack of IV and enteric contrast limits assessment. Stomach is nondistended. Small bowel is decompressed. Pelvic bowel loops are suboptimally assessed. Fluid throughout the colon with air-fluid levels. No obvious colonic wall thickening, sigmoid colon not well assessed. Irregular foci of air in the pelvis, difficult to confirm intraluminal bowel gas, for example image 81 series 3.  Vascular/Lymphatic: Abdominal aorta is normal in caliber. Multiple prominent retroperitoneal lymph nodes which appears similar to prior exam under likely reactive. Reproductive: Intermediate density left adnexal lesion on prior exam persists, now with an air-fluid level, approximately 4.5 x 3.1 cm series 3, image 74. This may be contiguous with the right adnexa were there is irregular  air and fluid, image 75 series 3. The uterus is poorly defined on the current exam. There is generalized fat stranding in the adnexa and pelvis. Other: Inflammatory process in the pelvis with fat stranding and free fluid. Adnexal structures are not well-defined. No upper abdominal ascites. No fluid collection in the right pericolic gutter at site of prior percutaneous strain. Musculoskeletal: There are no acute or suspicious osseous abnormalities. Scattered bone islands in the pelvis. IMPRESSION: 1. Patient with history complex bilateral adnexal masses, previously characterized as PID, indeterminate density lesions in the adnexa persist, however now contain air and air-fluid levels, and are poorly defined. There is generalized fat stranding in the pelvis with inflammation and free fluid. Findings are concerning for progression of tubo-ovarian abscess. Recommend GYN consultation. 2. Lack of IV and enteric contrast as well as inflammatory change limits detailed assessment of pelvic structures, including pelvic bowel loops. There are questionable foci of extraluminal air in the pelvis, which may be related to the adnexal process. Given recent colonoscopy, bowel perforation is difficult to exclude on imaging findings alone, however there is no colonic wall thickening to suggest this. 3. Mild bladder wall thickening about the dome, likely reactive. 4. Prominent retroperitoneal lymph nodes are similar to prior exam under likely reactive. 5. No fluid collection in the right pericolic gutter at site of prior percutaneous strain. These results  were called by telephone at the time of interpretation on 07/20/2019 at 7:57 pm to PA [facility] , who verbally acknowledged these results. Electronically Signed   By: Keith Rake M.D.   On: 07/20/2019 19:59   A/P: Gwendolyn Nguyen is an 33 y.o. female with pelvic masses/endometriomas/complex tubo-ovarian abcesses - improved with drainage; now with recurrent air fluid levels and fat stranding in pelvis with some free fluid - concerning for progression of tubo-ovarian abscess  -Recommend gyn consultation -Her symptoms are stable with regards to her abdominal discomfort relative to pre-and post colonoscopy.  I do not believe this represents a colonic perforation but rather reactive processes related to her complex adnexal process that is occurring. Her abdominal exam is also quite reassuring.  -Agree with IV antibiotics -Will need to obtain colonoscopy report -We will follow with you to ensure nothing worsening/changing given her colonoscopy today  Sharon Mt. Dema Severin, M.D. [facility] [phone]

## 2019-07-21 ENCOUNTER — Ambulatory Visit: Payer: BC Managed Care – PPO | Admitting: Physical Therapy

## 2019-07-21 LAB — CBC
HCT: 29.7 % — ABNORMAL LOW (ref 36.0–46.0)
Hemoglobin: 9.3 g/dL — ABNORMAL LOW (ref 12.0–15.0)
MCH: 27.1 pg (ref 26.0–34.0)
MCHC: 31.3 g/dL (ref 30.0–36.0)
MCV: 86.6 fL (ref 80.0–100.0)
Platelets: 379 10*3/uL (ref 150–400)
RBC: 3.43 MIL/uL — ABNORMAL LOW (ref 3.87–5.11)
RDW: 15.8 % — ABNORMAL HIGH (ref 11.5–15.5)
WBC: 16 10*3/uL — ABNORMAL HIGH (ref 4.0–10.5)
nRBC: 0 % (ref 0.0–0.2)

## 2019-07-21 LAB — BASIC METABOLIC PANEL
Anion gap: 8 (ref 5–15)
BUN: 7 mg/dL (ref 6–20)
CO2: 22 mmol/L (ref 22–32)
Calcium: 8.9 mg/dL (ref 8.9–10.3)
Chloride: 108 mmol/L (ref 98–111)
Creatinine, Ser: 1.07 mg/dL — ABNORMAL HIGH (ref 0.44–1.00)
GFR calc Af Amer: >60 mL/min (ref >60)
GFR calc non Af Amer: >60 mL/min (ref >60)
Glucose, Bld: 142 mg/dL — ABNORMAL HIGH (ref 70–99)
Potassium: 3.9 mmol/L (ref 3.5–5.1)
Sodium: 138 mmol/L (ref 135–145)

## 2019-07-21 LAB — MRSA PCR SCREENING: MRSA by PCR: NEGATIVE

## 2019-07-21 LAB — SARS CORONAVIRUS 2 (TAT 6-24 HRS): SARS Coronavirus 2: NEGATIVE

## 2019-07-21 MED ORDER — METRONIDAZOLE 500 MG PO TABS
500.0000 mg | ORAL_TABLET | Freq: Two times a day (BID) | ORAL | Status: DC
  Administered 2019-07-21 – 2019-07-22 (×3): 500 mg via ORAL
  Filled 2019-07-21 (×3): qty 1

## 2019-07-21 MED ORDER — SODIUM CHLORIDE 0.9% FLUSH
10.0000 mL | INTRAVENOUS | Status: DC | PRN
  Administered 2019-07-29 – 2019-08-03 (×2): 10 mL
  Filled 2019-07-21 (×2): qty 40

## 2019-07-21 MED ORDER — WHITE PETROLATUM EX OINT
TOPICAL_OINTMENT | CUTANEOUS | Status: AC
  Administered 2019-07-21: 20:00:00
  Filled 2019-07-21: qty 28.35

## 2019-07-21 MED ORDER — HYDROMORPHONE HCL 1 MG/ML IJ SOLN
1.0000 mg | INTRAMUSCULAR | Status: DC | PRN
  Administered 2019-07-21 – 2019-07-22 (×8): 1 mg via INTRAVENOUS
  Filled 2019-07-21 (×9): qty 1

## 2019-07-21 MED ORDER — HYDROCODONE-ACETAMINOPHEN 7.5-325 MG PO TABS
1.0000 | ORAL_TABLET | Freq: Four times a day (QID) | ORAL | Status: DC | PRN
  Administered 2019-07-21 – 2019-07-22 (×3): 1 via ORAL
  Filled 2019-07-21 (×4): qty 1

## 2019-07-21 NOTE — Consult Note (Signed)
Gwendolyn Nguyen  Consult: pelvic abscess   HPI: 33 yo BF admitted for abdominal pain presumably caused by recurrence of pelvic abscess. Pt underwent colonoscopy yesterday and developed worsening pain for which she presented to ER. Per Dr Collene Mares, the prep was incomplete and colonoscopy only went to 40 cm.  Pt was previous admitted 7/13-7/25 for pelvic abscess, SBO. During that hospitalization she had IR drainage of her abscess collection and had post procedure resolution of the fluid collections. Due to her multiple other issue( SOB, bloody stools, difficulty walking) pt has been seen as outpt with w/u by GI ( ongoing), pulmonary etc. She was seen in our office on 9/20 for LLQ pain. Pt underwent pelvic sonogram which showed right adnexal mass c/w prior presumed hydrosalpinx. She was given IM rocephin and started on doxycycline/flagyl.  Cbc then showed wbc 11.1  PMH: allergy:  Meds; doxycycline, flagyl, iron Medical:IDA IBS Pelvic endometriosis Constipation PID/TOA  surgery:  OB: G0  FH noncontributory SH; single nonsmoker  ROS: (+) nausea/vomiting (+) abdominal pain (-) fever  PE: BP 121/85 (BP Location: Right Arm)   Pulse 95   Temp 98.1 F (36.7 C) (Oral)   Resp 19   LMP 06/21/2019 Comment: pt on depo and has breakthrough bleeding  SpO2 100%  General: WD WN BF in mild distress Skin  Dry HEENT anicteric sclera pink conjunctiva Cor RRR Lungs clear to A  Abd soft (+) BS tender w/o rebound Pad none extr no edema or calf tenderness   Ct Abdomen Pelvis Wo Contrast  Result Date: 07/20/2019 CLINICAL DATA:  Left pelvic pain. Severe. Colonoscopy today. Previous pelvic abscess, percutaneous drainage on the right. EXAM: CT ABDOMEN AND PELVIS WITHOUT CONTRAST TECHNIQUE: Multidetector CT imaging of the abdomen and pelvis was performed following the standard protocol without IV contrast. COMPARISON:  CT 05/19/2019 FINDINGS: Lower chest: Linear lingular and right lower lobe atelectasis. No  confluent airspace disease. No pleural fluid. Hepatobiliary: No focal liver abnormality is seen. No gallstones, gallbladder wall thickening, or biliary dilatation. Pancreas: No ductal dilatation or inflammation. Spleen: Normal in size without focal abnormality. Small splenule inferiorly. Adrenals/Urinary Tract: Normal adrenal glands. No hydronephrosis or perinephric edema. No renal stones. Ureters are decompressed. Urinary bladder is minimally distended, mild wall thickening about the dome. Stomach/Bowel: Lack of IV and enteric contrast limits assessment. Stomach is nondistended. Small bowel is decompressed. Pelvic bowel loops are suboptimally assessed. Fluid throughout the colon with air-fluid levels. No obvious colonic wall thickening, sigmoid colon not well assessed. Irregular foci of air in the pelvis, difficult to confirm intraluminal bowel gas, for example image 81 series 3. Vascular/Lymphatic: Abdominal aorta is normal in caliber. Multiple prominent retroperitoneal lymph nodes which appears similar to prior exam under likely reactive. Reproductive: Intermediate density left adnexal lesion on prior exam persists, now with an air-fluid level, approximately 4.5 x 3.1 cm series 3, image 74. This may be contiguous with the right adnexa were there is irregular air and fluid, image 75 series 3. The uterus is poorly defined on the current exam. There is generalized fat stranding in the adnexa and pelvis. Other: Inflammatory process in the pelvis with fat stranding and free fluid. Adnexal structures are not well-defined. No upper abdominal ascites. No fluid collection in the right pericolic gutter at site of prior percutaneous strain. Musculoskeletal: There are no acute or suspicious osseous abnormalities. Scattered bone islands in the pelvis. IMPRESSION: 1. Patient with history complex bilateral adnexal masses, previously characterized as PID, indeterminate density lesions in the adnexa persist,  however now contain  air and air-fluid levels, and are poorly defined. There is generalized fat stranding in the pelvis with inflammation and free fluid. Findings are concerning for progression of tubo-ovarian abscess. Recommend GYN consultation. 2. Lack of IV and enteric contrast as well as inflammatory change limits detailed assessment of pelvic structures, including pelvic bowel loops. There are questionable foci of extraluminal air in the pelvis, which may be related to the adnexal process. Given recent colonoscopy, bowel perforation is difficult to exclude on imaging findings alone, however there is no colonic wall thickening to suggest this. 3. Mild bladder wall thickening about the dome, likely reactive. 4. Prominent retroperitoneal lymph nodes are similar to prior exam under likely reactive. 5. No fluid collection in the right pericolic gutter at site of prior percutaneous strain. These results were called by telephone at the time of interpretation on 07/20/2019 at 7:57 pm to PA [facility] , who verbally acknowledged these results. Electronically Signed   By: Keith Rake M.D.   On: 07/20/2019 19:59     CBC Latest Ref Rng & Units 07/21/2019 07/20/2019 06/09/2019  WBC 4.0 - 10.5 K/uL 16.0(H) 16.9(H) -  Hemoglobin 12.0 - 15.0 g/dL 9.3(L) 11.2(L) 11.7  Hematocrit 36.0 - 46.0 % 29.7(L) 34.2(L) 37.7  Platelets 150 - 400 K/uL 379 472(H) -  IMP: PID/TOA IDA P) repeat CBC w/ diff 10/3. ADAT. D/c doxycycline. Add flagyl for coverage of BV. Pain mgmt. Defer surgical intervention at this time

## 2019-07-21 NOTE — Progress Notes (Signed)
PROGRESS NOTE    Gwendolyn Nguyen  [id number] DOB: 1986/10/06 DOA: 07/20/2019 PCP: Forrest Moron, MD   Brief Narrative:  Gwendolyn Nguyen is a 33 y.o. female with medical history significant of PID with complicated B TOAs, s/p IV ABx and perc drainage back in July of this year, also had SBO during that admit.  Cultures with no growth from that time.  Does have h/o gonorrhea in past couple of years ago. Recently having progressively worsening pelvic pain.  Intermittent.  Continue to worsen despite being on doxy/flagyl for the past week or so. Some concern for possible GI process so colonoscopy was done today, no results available. As part of bowel prep she had held her home pain meds, woke up with severe pain post colonoscopy though this was same pain and location she had been having. Associated N/V. No diarrhea, no measured fevers.  No dysuria.  ED Course: Initially in severe pain with tachycardia to 160s, improved to NSR after pain controlled with dilaudid.  CT non-contrast (IV dye allergy) reveals: 1) worsening / progressive B TOAs 2) ? Intraluminal air foci, no obvious site of perf or bowel inflammation but cant rule out post colonoscopy perf on CT.  Given zosyn / flagyl in ED.   Assessment & Plan:   Principal Problem:   Tubo-ovarian abscess Active Problems:   Abdominal pain   1. TOA - 1. As noted, progressive at this point despite abx including IV abx in July with perc drainage, also failed outpt ABx this past week. 2. Cultures neg in July. 3. Will cont zosyn / doxy for now 4. Neg MRSA PCR. 5. GYN to see this morning 6. Cont IVF: 100 cc/hr NS 7. Cont NPO after MN 8. Pain ctrl with dilaudid PRN. 2. Abd pain - 1. Likely due to TOA 2. Had gen surgery see her here in ED, they do NOT think that patient has post colonoscopy perforation 3. Appreciate their f/up this AM.   DVT prophylaxis: SCD/Compression stockings  Code Status: Ful    Code Status Orders  (From  admission, onward)         Start     Ordered   07/20/19 2108  Full code  Continuous     07/20/19 2109        Code Status History    Date Active Date Inactive Code Status Order ID Comments User Context   05/02/2019 1441 05/14/2019 2001 Full Code [id number]  [name], MD ED   Advance Care Planning Activity     Family Communication: spoke in detail with aptient  Disposition Plan:   Patient remained inpatient for further subspecialty evaluation possible surgical intervention, IV antibiotics.  Without these treatments patient at risk for life-threatening clinical deterioration Consults called: None Admission status: Inpatient   Consultants:   gen surg, obgyn  Procedures:  Ct Abdomen Pelvis Wo Contrast  Result Date: 07/20/2019 CLINICAL DATA:  Left pelvic pain. Severe. Colonoscopy today. Previous pelvic abscess, percutaneous drainage on the right. EXAM: CT ABDOMEN AND PELVIS WITHOUT CONTRAST TECHNIQUE: Multidetector CT imaging of the abdomen and pelvis was performed following the standard protocol without IV contrast. COMPARISON:  CT 05/19/2019 FINDINGS: Lower chest: Linear lingular and right lower lobe atelectasis. No confluent airspace disease. No pleural fluid. Hepatobiliary: No focal liver abnormality is seen. No gallstones, gallbladder wall thickening, or biliary dilatation. Pancreas: No ductal dilatation or inflammation. Spleen: Normal in size without focal abnormality. Small splenule inferiorly. Adrenals/Urinary Tract: Normal adrenal glands. No hydronephrosis or perinephric edema. No  renal stones. Ureters are decompressed. Urinary bladder is minimally distended, mild wall thickening about the dome. Stomach/Bowel: Lack of IV and enteric contrast limits assessment. Stomach is nondistended. Small bowel is decompressed. Pelvic bowel loops are suboptimally assessed. Fluid throughout the colon with air-fluid levels. No obvious colonic wall thickening, sigmoid colon not well assessed.  Irregular foci of air in the pelvis, difficult to confirm intraluminal bowel gas, for example image 81 series 3. Vascular/Lymphatic: Abdominal aorta is normal in caliber. Multiple prominent retroperitoneal lymph nodes which appears similar to prior exam under likely reactive. Reproductive: Intermediate density left adnexal lesion on prior exam persists, now with an air-fluid level, approximately 4.5 x 3.1 cm series 3, image 74. This may be contiguous with the right adnexa were there is irregular air and fluid, image 75 series 3. The uterus is poorly defined on the current exam. There is generalized fat stranding in the adnexa and pelvis. Other: Inflammatory process in the pelvis with fat stranding and free fluid. Adnexal structures are not well-defined. No upper abdominal ascites. No fluid collection in the right pericolic gutter at site of prior percutaneous strain. Musculoskeletal: There are no acute or suspicious osseous abnormalities. Scattered bone islands in the pelvis. IMPRESSION: 1. Patient with history complex bilateral adnexal masses, previously characterized as PID, indeterminate density lesions in the adnexa persist, however now contain air and air-fluid levels, and are poorly defined. There is generalized fat stranding in the pelvis with inflammation and free fluid. Findings are concerning for progression of tubo-ovarian abscess. Recommend GYN consultation. 2. Lack of IV and enteric contrast as well as inflammatory change limits detailed assessment of pelvic structures, including pelvic bowel loops. There are questionable foci of extraluminal air in the pelvis, which may be related to the adnexal process. Given recent colonoscopy, bowel perforation is difficult to exclude on imaging findings alone, however there is no colonic wall thickening to suggest this. 3. Mild bladder wall thickening about the dome, likely reactive. 4. Prominent retroperitoneal lymph nodes are similar to prior exam under likely  reactive. 5. No fluid collection in the right pericolic gutter at site of prior percutaneous strain. These results were called by telephone at the time of interpretation on 07/20/2019 at 7:57 pm to PA [facility] , who verbally acknowledged these results. Electronically Signed   By: Keith Rake M.D.   On: 07/20/2019 19:59   Ct Chest W Contrast  Result Date: 06/24/2019 CLINICAL DATA:  Chest pain EXAM: CT CHEST WITH CONTRAST TECHNIQUE: Multidetector CT imaging of the chest was performed during intravenous contrast administration. CONTRAST:  86mL ISOVUE-300 IOPAMIDOL (ISOVUE-300) INJECTION 61% COMPARISON:  May 02, 2019. FINDINGS: Cardiovascular: There is no large centrally located pulmonary embolism. Heart size is normal. There is no significant pericardial effusion. No evidence for an aortic dissection. Mediastinum/Nodes: --No mediastinal or hilar lymphadenopathy. --No axillary lymphadenopathy. --No supraclavicular lymphadenopathy. --Normal thyroid gland. --The esophagus is unremarkable Lungs/Pleura: There are few linear airspace opacities in the right lower lobe, right middle lobe, and right upper lobe. There is no large focal infiltrate. There is no pneumothorax or pleural effusion. Upper Abdomen: No acute abnormality detected. Musculoskeletal: No chest wall abnormality. No acute or significant osseous findings. There is a small sclerotic lesion in the sternum, of doubtful clinical significance. This is stable from July 13th CT. IMPRESSION: 1. There are few linear airspace opacities in the right upper lobe, right middle lobe, and right lower lobe, likely representing areas of subsegmental atelectasis. No large focal infiltrate. 2. No evidence for aortic  dissection or large pulmonary embolism. Electronically Signed   By: Constance Holster M.D.   On: 06/24/2019 20:34   Dg Abd 2 Views  Result Date: 07/02/2019 CLINICAL DATA:  Abdominal pain. Bloating. Constipation. Evaluate for obstruction. EXAM: ABDOMEN  - 2 VIEW COMPARISON:  CT 05/19/2019 FINDINGS: No bowel dilatation to suggest obstruction. No free intra-abdominal air. Small to moderate stool burden in the proximal colon, small volume of stool distally. No radiopaque calculi or abnormal soft tissue calcifications. Lower most lung bases are clear. Previous drainage catheter is been removed. IMPRESSION: Normal bowel gas pattern, no obstruction. Small to moderate volume of colonic stool. Electronically Signed   By: Keith Rake M.D.   On: 07/02/2019 01:49     Antimicrobials:   Flagyl/zosyn > 9/30   Subjective: Admitted overnight with abdominal pain and probable tubo-ovarian abscess No acute hemodynamic events  Objective: Vitals:   07/20/19 1945 07/20/19 2000 07/20/19 2226 07/21/19 0535  BP: 123/83 123/88 132/82 121/85  Pulse:  99 97 95  Resp:  19    Temp:   98.2 F (36.8 C) 98.1 F (36.7 C)  TempSrc:   Oral Oral  SpO2:  98% 100% 100%    Intake/Output Summary (Last 24 hours) at 07/21/2019 1147 Last data filed at 07/21/2019 0444 Gross per 24 hour  Intake 2227.34 ml  Output --  Net 2227.34 ml   There were no vitals filed for this visit.  Examination:  General exam: Appears calm and comfortable  Respiratory system: Clear to auscultation. Respiratory effort normal. Cardiovascular system: S1 & S2 heard, RRR. No JVD, murmurs, rubs, gallops or clicks. No pedal edema. Gastrointestinal system: Abdomen is nondistended, soft and mildly TTP,  Central nervous system: Alert and oriented. No focal neurological deficits. Extremities: WWP, No edema Skin: No rashes, lesions or ulcers Psychiatry: Judgement and insight appear normal. Mood & affect appropriate.     Data Reviewed: I have personally reviewed following labs and imaging studies  CBC: Recent Labs  Lab 07/20/19 1634 07/21/19 0107  WBC 16.9* 16.0*  HGB 11.2* 9.3*  HCT 34.2* 29.7*  MCV 86.1 86.6  PLT 472* [id number]   Basic Metabolic Panel: Recent Labs  Lab 07/20/19 1634  07/21/19 0107  NA 139 138  K 3.5 3.9  CL 105 108  CO2 21* 22  GLUCOSE 119* 142*  BUN 8 7  CREATININE 1.00 1.07*  CALCIUM 9.9 8.9   GFR: Estimated Creatinine Clearance: 75.4 mL/min (A) (by C-G formula based on SCr of 1.07 mg/dL (H)). Liver Function Tests: Recent Labs  Lab 07/20/19 1634  AST 32  ALT 40  ALKPHOS 50  BILITOT 0.4  PROT 7.4  ALBUMIN 3.8   Recent Labs  Lab 07/20/19 1634  LIPASE 27   No results for input(s): AMMONIA in the last 168 hours. Coagulation Profile: No results for input(s): INR, PROTIME in the last 168 hours. Cardiac Enzymes: No results for input(s): CKTOTAL, CKMB, CKMBINDEX, TROPONINI in the last 168 hours. BNP (last 3 results) No results for input(s): PROBNP in the last 8760 hours. HbA1C: No results for input(s): HGBA1C in the last 72 hours. CBG: No results for input(s): GLUCAP in the last 168 hours. Lipid Profile: No results for input(s): CHOL, HDL, LDLCALC, TRIG, CHOLHDL, LDLDIRECT in the last 72 hours. Thyroid Function Tests: No results for input(s): TSH, T4TOTAL, FREET4, T3FREE, THYROIDAB in the last 72 hours. Anemia Panel: No results for input(s): VITAMINB12, FOLATE, FERRITIN, TIBC, IRON, RETICCTPCT in the last 72 hours. Sepsis Labs: Recent Labs  Lab 07/20/19 2045  LATICACIDVEN 1.2    Recent Results (from the past 240 hour(s))  Blood culture (routine x 2)     Status: None (Preliminary result)   Collection Time: 07/20/19  8:45 PM   Specimen: BLOOD RIGHT HAND  Result Value Ref Range Status   Specimen Description BLOOD RIGHT HAND  Final   Special Requests   Final    BOTTLES DRAWN AEROBIC AND ANAEROBIC Blood Culture results may not be optimal due to an inadequate volume of blood received in culture bottles   Culture   Final    NO GROWTH < 12 HOURS Performed at [facility], [place] [address] [place], [place] [address]    Report Status PENDING  Incomplete  SARS CORONAVIRUS 2 (TAT 6-24 HRS) Nasopharyngeal Nasopharyngeal Swab      Status: None   Collection Time: 07/20/19  9:03 PM   Specimen: Nasopharyngeal Swab  Result Value Ref Range Status   SARS Coronavirus 2 NEGATIVE NEGATIVE Final    Comment: (NOTE) SARS-CoV-2 target nucleic acids are NOT DETECTED. The SARS-CoV-2 RNA is generally detectable in upper and lower respiratory specimens during the acute phase of infection. Negative results do not preclude SARS-CoV-2 infection, do not rule out co-infections with other pathogens, and should not be used as the sole basis for treatment or other patient management decisions. Negative results must be combined with clinical observations, patient history, and epidemiological information. The expected result is Negative. Fact Sheet for Patients: SugarRoll.be Fact Sheet for Healthcare Providers: https://www.woods-mathews.com/ This test is not yet approved or cleared by the [place] FDA and  has been authorized for detection and/or diagnosis of SARS-CoV-2 by FDA under an Emergency Use Authorization (EUA). This EUA will remain  in effect (meaning this test can be used) for the duration of the COVID-19 declaration under Section 56 4(b)(1) of the Act, 21 U.S.C. section 360bbb-3(b)(1), unless the authorization is terminated or revoked sooner. Performed at [facility], [place] [address] [place], [place] [address]   Blood culture (routine x 2)     Status: None (Preliminary result)   Collection Time: 07/20/19  9:09 PM   Specimen: BLOOD  Result Value Ref Range Status   Specimen Description BLOOD RIGHT ANTECUBITAL  Final   Special Requests   Final    BOTTLES DRAWN AEROBIC ONLY Blood Culture adequate volume   Culture   Final    NO GROWTH < 12 HOURS Performed at [facility], [place] [address] [place], [place] [address]    Report Status PENDING  Incomplete  MRSA PCR Screening     Status: None   Collection Time: 07/20/19  9:23 PM   Specimen: Nasal Mucosa;  Nasopharyngeal  Result Value Ref Range Status   MRSA by PCR NEGATIVE NEGATIVE Final    Comment:        The GeneXpert MRSA Assay (FDA approved for NASAL specimens only), is one component of a comprehensive MRSA colonization surveillance program. It is not intended to diagnose MRSA infection nor to guide or monitor treatment for MRSA infections. Performed at [facility], [place] [address], [place], [place] [address]          Radiology Studies: Ct Abdomen Pelvis Wo Contrast  Result Date: 07/20/2019 CLINICAL DATA:  Left pelvic pain. Severe. Colonoscopy today. Previous pelvic abscess, percutaneous drainage on the right. EXAM: CT ABDOMEN AND PELVIS WITHOUT CONTRAST TECHNIQUE: Multidetector CT imaging of the abdomen and pelvis was performed following the standard protocol without  IV contrast. COMPARISON:  CT 05/19/2019 FINDINGS: Lower chest: Linear lingular and right lower lobe atelectasis. No confluent airspace disease. No pleural fluid. Hepatobiliary: No focal liver abnormality is seen. No gallstones, gallbladder wall thickening, or biliary dilatation. Pancreas: No ductal dilatation or inflammation. Spleen: Normal in size without focal abnormality. Small splenule inferiorly. Adrenals/Urinary Tract: Normal adrenal glands. No hydronephrosis or perinephric edema. No renal stones. Ureters are decompressed. Urinary bladder is minimally distended, mild wall thickening about the dome. Stomach/Bowel: Lack of IV and enteric contrast limits assessment. Stomach is nondistended. Small bowel is decompressed. Pelvic bowel loops are suboptimally assessed. Fluid throughout the colon with air-fluid levels. No obvious colonic wall thickening, sigmoid colon not well assessed. Irregular foci of air in the pelvis, difficult to confirm intraluminal bowel gas, for example image 81 series 3. Vascular/Lymphatic: Abdominal aorta is normal in caliber. Multiple prominent retroperitoneal lymph nodes which appears similar  to prior exam under likely reactive. Reproductive: Intermediate density left adnexal lesion on prior exam persists, now with an air-fluid level, approximately 4.5 x 3.1 cm series 3, image 74. This may be contiguous with the right adnexa were there is irregular air and fluid, image 75 series 3. The uterus is poorly defined on the current exam. There is generalized fat stranding in the adnexa and pelvis. Other: Inflammatory process in the pelvis with fat stranding and free fluid. Adnexal structures are not well-defined. No upper abdominal ascites. No fluid collection in the right pericolic gutter at site of prior percutaneous strain. Musculoskeletal: There are no acute or suspicious osseous abnormalities. Scattered bone islands in the pelvis. IMPRESSION: 1. Patient with history complex bilateral adnexal masses, previously characterized as PID, indeterminate density lesions in the adnexa persist, however now contain air and air-fluid levels, and are poorly defined. There is generalized fat stranding in the pelvis with inflammation and free fluid. Findings are concerning for progression of tubo-ovarian abscess. Recommend GYN consultation. 2. Lack of IV and enteric contrast as well as inflammatory change limits detailed assessment of pelvic structures, including pelvic bowel loops. There are questionable foci of extraluminal air in the pelvis, which may be related to the adnexal process. Given recent colonoscopy, bowel perforation is difficult to exclude on imaging findings alone, however there is no colonic wall thickening to suggest this. 3. Mild bladder wall thickening about the dome, likely reactive. 4. Prominent retroperitoneal lymph nodes are similar to prior exam under likely reactive. 5. No fluid collection in the right pericolic gutter at site of prior percutaneous strain. These results were called by telephone at the time of interpretation on 07/20/2019 at 7:57 pm to PA [facility] , who verbally acknowledged  these results. Electronically Signed   By: Keith Rake M.D.   On: 07/20/2019 19:59        Scheduled Meds:  metroNIDAZOLE  500 mg Oral Q12H   Continuous Infusions:  sodium chloride Stopped (07/21/19 0359)   piperacillin-tazobactam (ZOSYN)  IV 3.375 g (07/21/19 0825)     LOS: 1 day    Time spent: 43 min    Nicolette Bang, MD [facility]  If 7PM-7AM, please contact night-coverage  07/21/2019, 11:47 AM

## 2019-07-21 NOTE — Progress Notes (Signed)
Patient ID: Gwendolyn Nguyen, female   DOB: 08-19-1986, 33 y.o.   MRN: [id number]       Subjective: No new issues today.  Still with abdominal pain, but she is very clear that this is the same pain she had before her colonoscopy related to her pelvic issues and not new since her colonoscopy.  Objective: Vital signs in last 24 hours: Temp:  [98.1 F (36.7 C)-98.3 F (36.8 C)] 98.1 F (36.7 C) (10/01 0535) Pulse Rate:  [93-150] 95 (10/01 0535) Resp:  [16-20] 19 (09/30 2000) BP: (115-154)/(74-104) 121/85 (10/01 0535) SpO2:  [97 %-100 %] 100 % (10/01 0535) Last BM Date: 07/19/19  Intake/Output from previous day: 09/30 0701 - 10/01 0700 In: 2227.3 [P.O.:600; I.V.:455.3; IV Piggyback:1172] Out: -  Intake/Output this shift: No intake/output data recorded.  PE: Heart: regular, but tachy Lungs: CTAB Abd: soft, mild diffuse tenderness, but no peritonitis, +BS  Lab Results:  Recent Labs    07/20/19 1634 07/21/19 0107  WBC 16.9* 16.0*  HGB 11.2* 9.3*  HCT 34.2* 29.7*  PLT 472* 379   BMET Recent Labs    07/20/19 1634 07/21/19 0107  NA 139 138  K 3.5 3.9  CL 105 108  CO2 21* 22  GLUCOSE 119* 142*  BUN 8 7  CREATININE 1.00 1.07*  CALCIUM 9.9 8.9   PT/INR No results for input(s): LABPROT, INR in the last 72 hours. CMP     Component Value Date/Time   NA 138 07/21/2019 0107   K 3.9 07/21/2019 0107   CL 108 07/21/2019 0107   CO2 22 07/21/2019 0107   GLUCOSE 142 (H) 07/21/2019 0107   BUN 7 07/21/2019 0107   CREATININE 1.07 (H) 07/21/2019 0107   CALCIUM 8.9 07/21/2019 0107   PROT 7.4 07/20/2019 1634   ALBUMIN 3.8 07/20/2019 1634   AST 32 07/20/2019 1634   ALT 40 07/20/2019 1634   ALKPHOS 50 07/20/2019 1634   BILITOT 0.4 07/20/2019 1634   GFRNONAA >60 07/21/2019 0107   GFRAA >60 07/21/2019 0107   Lipase     Component Value Date/Time   LIPASE 27 07/20/2019 1634       Studies/Results: Ct Abdomen Pelvis Wo Contrast  Result Date: 07/20/2019 CLINICAL DATA:   Left pelvic pain. Severe. Colonoscopy today. Previous pelvic abscess, percutaneous drainage on the right. EXAM: CT ABDOMEN AND PELVIS WITHOUT CONTRAST TECHNIQUE: Multidetector CT imaging of the abdomen and pelvis was performed following the standard protocol without IV contrast. COMPARISON:  CT 05/19/2019 FINDINGS: Lower chest: Linear lingular and right lower lobe atelectasis. No confluent airspace disease. No pleural fluid. Hepatobiliary: No focal liver abnormality is seen. No gallstones, gallbladder wall thickening, or biliary dilatation. Pancreas: No ductal dilatation or inflammation. Spleen: Normal in size without focal abnormality. Small splenule inferiorly. Adrenals/Urinary Tract: Normal adrenal glands. No hydronephrosis or perinephric edema. No renal stones. Ureters are decompressed. Urinary bladder is minimally distended, mild wall thickening about the dome. Stomach/Bowel: Lack of IV and enteric contrast limits assessment. Stomach is nondistended. Small bowel is decompressed. Pelvic bowel loops are suboptimally assessed. Fluid throughout the colon with air-fluid levels. No obvious colonic wall thickening, sigmoid colon not well assessed. Irregular foci of air in the pelvis, difficult to confirm intraluminal bowel gas, for example image 81 series 3. Vascular/Lymphatic: Abdominal aorta is normal in caliber. Multiple prominent retroperitoneal lymph nodes which appears similar to prior exam under likely reactive. Reproductive: Intermediate density left adnexal lesion on prior exam persists, now with an air-fluid level, approximately 4.5 x 3.1  cm series 3, image 74. This may be contiguous with the right adnexa were there is irregular air and fluid, image 75 series 3. The uterus is poorly defined on the current exam. There is generalized fat stranding in the adnexa and pelvis. Other: Inflammatory process in the pelvis with fat stranding and free fluid. Adnexal structures are not well-defined. No upper abdominal  ascites. No fluid collection in the right pericolic gutter at site of prior percutaneous strain. Musculoskeletal: There are no acute or suspicious osseous abnormalities. Scattered bone islands in the pelvis. IMPRESSION: 1. Patient with history complex bilateral adnexal masses, previously characterized as PID, indeterminate density lesions in the adnexa persist, however now contain air and air-fluid levels, and are poorly defined. There is generalized fat stranding in the pelvis with inflammation and free fluid. Findings are concerning for progression of tubo-ovarian abscess. Recommend GYN consultation. 2. Lack of IV and enteric contrast as well as inflammatory change limits detailed assessment of pelvic structures, including pelvic bowel loops. There are questionable foci of extraluminal air in the pelvis, which may be related to the adnexal process. Given recent colonoscopy, bowel perforation is difficult to exclude on imaging findings alone, however there is no colonic wall thickening to suggest this. 3. Mild bladder wall thickening about the dome, likely reactive. 4. Prominent retroperitoneal lymph nodes are similar to prior exam under likely reactive. 5. No fluid collection in the right pericolic gutter at site of prior percutaneous strain. These results were called by telephone at the time of interpretation on 07/20/2019 at 7:57 pm to PA [facility] , who verbally acknowledged these results. Electronically Signed   By: Keith Rake M.D.   On: 07/20/2019 19:59    Anti-infectives: Anti-infectives (From admission, onward)   Start     Dose/Rate Route Frequency Ordered Stop   07/20/19 2200  doxycycline (VIBRAMYCIN) 100 mg in sodium chloride 0.9 % 250 mL IVPB     100 mg 125 mL/hr over 120 Minutes Intravenous Every 12 hours 07/20/19 2103     07/20/19 2100  piperacillin-tazobactam (ZOSYN) IVPB 3.375 g     3.375 g 12.5 mL/hr over 240 Minutes Intravenous Every 8 hours 07/20/19 2044     07/20/19 2045   metroNIDAZOLE (FLAGYL) IVPB 500 mg     500 mg 100 mL/hr over 60 Minutes Intravenous  Once 07/20/19 2040 07/20/19 2330       Assessment/Plan Abdominal pain, pelvic masses/endometriomas -pain in not c/w post cscope complication as this is the same typical pain she has secondary to pelvic masses -GYN will need to see the patient to determine further plan of care -may have a diet from our standpoint -no general surgery indications at this time.  FEN - may have a diet VTE - per medicine ID - doxy   LOS: 1 day    Henreitta Cea , [facility] 07/21/2019, 8:47 AM Pager: [phone]

## 2019-07-22 MED ORDER — LIDOCAINE 5 % EX PTCH
1.0000 | MEDICATED_PATCH | CUTANEOUS | Status: DC
  Administered 2019-07-22 – 2019-08-02 (×9): 1 via TRANSDERMAL
  Filled 2019-07-22 (×12): qty 1

## 2019-07-22 MED ORDER — METRONIDAZOLE 500 MG PO TABS
500.0000 mg | ORAL_TABLET | Freq: Two times a day (BID) | ORAL | Status: DC
  Administered 2019-07-22 – 2019-07-23 (×2): 500 mg via ORAL
  Filled 2019-07-22 (×2): qty 1

## 2019-07-22 MED ORDER — SIMETHICONE 80 MG PO CHEW
80.0000 mg | CHEWABLE_TABLET | Freq: Four times a day (QID) | ORAL | Status: DC | PRN
  Administered 2019-07-22: 80 mg via ORAL
  Filled 2019-07-22: qty 1

## 2019-07-22 MED ORDER — PANTOPRAZOLE SODIUM 40 MG PO TBEC
40.0000 mg | DELAYED_RELEASE_TABLET | Freq: Every day | ORAL | Status: DC
  Administered 2019-07-22 – 2019-07-23 (×2): 40 mg via ORAL
  Filled 2019-07-22 (×2): qty 1

## 2019-07-22 MED ORDER — HYDROMORPHONE HCL 2 MG PO TABS
4.0000 mg | ORAL_TABLET | ORAL | Status: DC | PRN
  Administered 2019-07-22 – 2019-07-23 (×6): 4 mg via ORAL
  Filled 2019-07-22 (×6): qty 2

## 2019-07-22 MED ORDER — METHOCARBAMOL 500 MG PO TABS
500.0000 mg | ORAL_TABLET | Freq: Four times a day (QID) | ORAL | Status: DC
  Administered 2019-07-22 – 2019-07-23 (×6): 500 mg via ORAL
  Filled 2019-07-22 (×6): qty 1

## 2019-07-22 MED ORDER — OXYCODONE-ACETAMINOPHEN 7.5-325 MG PO TABS: 1.0000 | ORAL_TABLET | ORAL | Status: DC | PRN

## 2019-07-22 MED ORDER — KETOROLAC TROMETHAMINE 15 MG/ML IJ SOLN
15.0000 mg | Freq: Once | INTRAMUSCULAR | Status: AC
  Administered 2019-07-22: 13:00:00 15 mg via INTRAVENOUS
  Filled 2019-07-22: qty 1

## 2019-07-22 MED ORDER — DOXYCYCLINE HYCLATE 100 MG PO TABS
100.0000 mg | ORAL_TABLET | Freq: Two times a day (BID) | ORAL | Status: DC
  Administered 2019-07-22: 12:00:00 100 mg via ORAL
  Filled 2019-07-22: qty 1

## 2019-07-22 MED ORDER — SIMETHICONE 80 MG PO CHEW
160.0000 mg | CHEWABLE_TABLET | Freq: Four times a day (QID) | ORAL | Status: DC
  Administered 2019-07-22 – 2019-07-23 (×5): 160 mg via ORAL
  Filled 2019-07-22 (×5): qty 2

## 2019-07-22 NOTE — Progress Notes (Signed)
Patient stated she is not feeling any relief with current pain medications; See MAR for administered meds; Pokhrel, MD text paged regarding patient's concerns. Awaiting orders and instruction. Will continue to monitor.

## 2019-07-22 NOTE — Progress Notes (Addendum)
PROGRESS NOTE    Gwendolyn Nguyen  [id number] DOB: 1986-05-14 DOA: 07/20/2019 PCP: Forrest Moron, MD   Brief Narrative:   Gwendolyn Nguyen is a 33 y.o. female with medical history significant of  complicated BL TOAs, s/p IV ABx and perc drainage back in July of this year, also had SBO during that admit.  Cultures with no growth from that time.  Does have h/o gonorrhea in past couple of years ago. Recently having progressively worsening pelvic pain.  Intermittent.  Continue to worsen despite being on doxy/flagyl for the past week or so. Some concern for possible GI process so colonoscopy was done today, no results available. As part of bowel prep she had held her home pain meds, woke up with severe pain post colonoscopy though this was same pain and location she had been having. Associated N/V.  ED Course: Initially in severe pain with tachycardia to 160s, improved to NSR after pain controlled with dilaudid. CT non-contrast (IV dye allergy) reveals: worsening / progressive B TOAs,? Intraluminal air foci, no obvious site of perf or bowel inflammation but cant rule out post colonoscopy perf on CT. Given zosyn / flagyl in ED. The patient was then admitted to hospital.  Assessment & Plan:   Principal Problem:   Tubo-ovarian abscess Active Problems:   Abdominal pain  Recurrent tubo-ovarian abscess, failure of outpatient treatment.  Previous history of percutaneous drainage in July with negative cultures.  Currently on Zosyn and metro. Negative MRSA PCR.  Gynecology has been notified.  Await gynecological opinion.  Continue IV fluids.  Focus on analgesia.  Patient complains of a bloating sensation and abdominal discomfort.  Symptomatic treatment.  Add Protonix, simethicone, continue liquid diet- regular diet as tolerated..  General surgery was consulted and recommended gynecological evaluation.  Addendum:  07/22/2019 1:12 PM  Spoke with Dr Garwin Brothers, GYN. At this time, patient will be transferred  to [facility] service. No medical needs identified. Will sign off.    DVT prophylaxis: SCD/Compression stockings   Code Status: Ful    Code Status Orders  (From admission, onward)         Start     Ordered   07/20/19 2108  Full code  Continuous     07/20/19 2109        Code Status History    Date Active Date Inactive Code Status Order ID Comments User Context   05/02/2019 1441 05/14/2019 2001 Full Code [id number]  [name], MD ED   Advance Care Planning Activity     Family Communication: Spoke with the patient.  Disposition Plan:   Patient remained inpatient for further subspecialty evaluation possible surgical intervention, IV antibiotics.   Consults called:  Gynecology has been notified   Consultants:   gen surg, obgyn  Procedures:  Ct Abdomen Pelvis Wo Contrast  Result Date: 07/20/2019 CLINICAL DATA:  Left pelvic pain. Severe. Colonoscopy today. Previous pelvic abscess, percutaneous drainage on the right. EXAM: CT ABDOMEN AND PELVIS WITHOUT CONTRAST TECHNIQUE: Multidetector CT imaging of the abdomen and pelvis was performed following the standard protocol without IV contrast. COMPARISON:  CT 05/19/2019 FINDINGS: Lower chest: Linear lingular and right lower lobe atelectasis. No confluent airspace disease. No pleural fluid. Hepatobiliary: No focal liver abnormality is seen. No gallstones, gallbladder wall thickening, or biliary dilatation. Pancreas: No ductal dilatation or inflammation. Spleen: Normal in size without focal abnormality. Small splenule inferiorly. Adrenals/Urinary Tract: Normal adrenal glands. No hydronephrosis or perinephric edema. No renal stones. Ureters are decompressed. Urinary bladder is minimally distended,  mild wall thickening about the dome. Stomach/Bowel: Lack of IV and enteric contrast limits assessment. Stomach is nondistended. Small bowel is decompressed. Pelvic bowel loops are suboptimally assessed. Fluid throughout the colon with air-fluid  levels. No obvious colonic wall thickening, sigmoid colon not well assessed. Irregular foci of air in the pelvis, difficult to confirm intraluminal bowel gas, for example image 81 series 3. Vascular/Lymphatic: Abdominal aorta is normal in caliber. Multiple prominent retroperitoneal lymph nodes which appears similar to prior exam under likely reactive. Reproductive: Intermediate density left adnexal lesion on prior exam persists, now with an air-fluid level, approximately 4.5 x 3.1 cm series 3, image 74. This may be contiguous with the right adnexa were there is irregular air and fluid, image 75 series 3. The uterus is poorly defined on the current exam. There is generalized fat stranding in the adnexa and pelvis. Other: Inflammatory process in the pelvis with fat stranding and free fluid. Adnexal structures are not well-defined. No upper abdominal ascites. No fluid collection in the right pericolic gutter at site of prior percutaneous strain. Musculoskeletal: There are no acute or suspicious osseous abnormalities. Scattered bone islands in the pelvis. IMPRESSION: 1. Patient with history complex bilateral adnexal masses, previously characterized as PID, indeterminate density lesions in the adnexa persist, however now contain air and air-fluid levels, and are poorly defined. There is generalized fat stranding in the pelvis with inflammation and free fluid. Findings are concerning for progression of tubo-ovarian abscess. Recommend GYN consultation. 2. Lack of IV and enteric contrast as well as inflammatory change limits detailed assessment of pelvic structures, including pelvic bowel loops. There are questionable foci of extraluminal air in the pelvis, which may be related to the adnexal process. Given recent colonoscopy, bowel perforation is difficult to exclude on imaging findings alone, however there is no colonic wall thickening to suggest this. 3. Mild bladder wall thickening about the dome, likely reactive. 4.  Prominent retroperitoneal lymph nodes are similar to prior exam under likely reactive. 5. No fluid collection in the right pericolic gutter at site of prior percutaneous strain. These results were called by telephone at the time of interpretation on 07/20/2019 at 7:57 pm to PA [facility] , who verbally acknowledged these results. Electronically Signed   By: Keith Rake M.D.   On: 07/20/2019 19:59   Ct Chest W Contrast  Result Date: 06/24/2019 CLINICAL DATA:  Chest pain EXAM: CT CHEST WITH CONTRAST TECHNIQUE: Multidetector CT imaging of the chest was performed during intravenous contrast administration. CONTRAST:  59mL ISOVUE-300 IOPAMIDOL (ISOVUE-300) INJECTION 61% COMPARISON:  May 02, 2019. FINDINGS: Cardiovascular: There is no large centrally located pulmonary embolism. Heart size is normal. There is no significant pericardial effusion. No evidence for an aortic dissection. Mediastinum/Nodes: --No mediastinal or hilar lymphadenopathy. --No axillary lymphadenopathy. --No supraclavicular lymphadenopathy. --Normal thyroid gland. --The esophagus is unremarkable Lungs/Pleura: There are few linear airspace opacities in the right lower lobe, right middle lobe, and right upper lobe. There is no large focal infiltrate. There is no pneumothorax or pleural effusion. Upper Abdomen: No acute abnormality detected. Musculoskeletal: No chest wall abnormality. No acute or significant osseous findings. There is a small sclerotic lesion in the sternum, of doubtful clinical significance. This is stable from July 13th CT. IMPRESSION: 1. There are few linear airspace opacities in the right upper lobe, right middle lobe, and right lower lobe, likely representing areas of subsegmental atelectasis. No large focal infiltrate. 2. No evidence for aortic dissection or large pulmonary embolism. Electronically Signed   By:  Constance Holster M.D.   On: 06/24/2019 20:34   Dg Abd 2 Views  Result Date: 07/02/2019 CLINICAL DATA:   Abdominal pain. Bloating. Constipation. Evaluate for obstruction. EXAM: ABDOMEN - 2 VIEW COMPARISON:  CT 05/19/2019 FINDINGS: No bowel dilatation to suggest obstruction. No free intra-abdominal air. Small to moderate stool burden in the proximal colon, small volume of stool distally. No radiopaque calculi or abnormal soft tissue calcifications. Lower most lung bases are clear. Previous drainage catheter is been removed. IMPRESSION: Normal bowel gas pattern, no obstruction. Small to moderate volume of colonic stool. Electronically Signed   By: Keith Rake M.D.   On: 07/02/2019 01:49    Antimicrobials:   Flagyl/zosyn > 9/30   Subjective: Today, patient complains of abdominal distention gas pain flatulence pain and all discomfort.  Her pain is relatively controlled with medication.  No nausea or vomiting today.  Denies any fever, chills or rigor.  Has not had a bowel movement since her colonoscopy on the day of admission.  Objective: Vitals:   07/21/19 0535 07/21/19 1820 07/21/19 2050 07/22/19 0651  BP: 121/85 128/86 110/84 (!) 134/91  Pulse: 95 99 (!) 108 (!) 110  Resp:  18 18 19   Temp: 98.1 F (36.7 C) 98.1 F (36.7 C) 97.9 F (36.6 C) 99 F (37.2 C)  TempSrc: Oral Oral Oral Oral  SpO2: 100%  100% 99%    Intake/Output Summary (Last 24 hours) at 07/22/2019 0936 Last data filed at 07/21/2019 1630 Gross per 24 hour  Intake 1050 ml  Output --  Net 1050 ml   There were no vitals filed for this visit.  Examination: General:  Average built, not in obvious distress HENT: Normocephalic, pupils equally reacting to light and accommodation.  No scleral pallor or icterus noted. Oral mucosa is moist.  Chest:  Clear breath sounds.  Diminished breath sounds bilaterally. No crackles or wheezes.  CVS: S1 &S2 heard. No murmur.  Regular rate and rhythm. Abdomen: Soft, suprapubic area and lower abdominal tenderness on palpation, nondistended.  Bowel sounds are heard.  Liver is not palpable, no  abdominal mass palpated Extremities: No cyanosis, clubbing or edema.  Peripheral pulses are palpable. Psych: Alert, awake and oriented, normal mood CNS:  No cranial nerve deficits.  Power equal in all extremities.  No sensory deficits noted.  No cerebellar signs.   Skin: Warm and dry.  No rashes noted. .    Data Reviewed: I have personally reviewed following labs and imaging studies  CBC: Recent Labs  Lab 07/20/19 1634 07/21/19 0107  WBC 16.9* 16.0*  HGB 11.2* 9.3*  HCT 34.2* 29.7*  MCV 86.1 86.6  PLT 472* [id number]   Basic Metabolic Panel: Recent Labs  Lab 07/20/19 1634 07/21/19 0107  NA 139 138  K 3.5 3.9  CL 105 108  CO2 21* 22  GLUCOSE 119* 142*  BUN 8 7  CREATININE 1.00 1.07*  CALCIUM 9.9 8.9   GFR: Estimated Creatinine Clearance: 75.4 mL/min (A) (by C-G formula based on SCr of 1.07 mg/dL (H)). Liver Function Tests: Recent Labs  Lab 07/20/19 1634  AST 32  ALT 40  ALKPHOS 50  BILITOT 0.4  PROT 7.4  ALBUMIN 3.8   Recent Labs  Lab 07/20/19 1634  LIPASE 27   No results for input(s): AMMONIA in the last 168 hours. Coagulation Profile: No results for input(s): INR, PROTIME in the last 168 hours. Cardiac Enzymes: No results for input(s): CKTOTAL, CKMB, CKMBINDEX, TROPONINI in the last 168 hours. BNP (last  3 results) No results for input(s): PROBNP in the last 8760 hours. HbA1C: No results for input(s): HGBA1C in the last 72 hours. CBG: No results for input(s): GLUCAP in the last 168 hours. Lipid Profile: No results for input(s): CHOL, HDL, LDLCALC, TRIG, CHOLHDL, LDLDIRECT in the last 72 hours. Thyroid Function Tests: No results for input(s): TSH, T4TOTAL, FREET4, T3FREE, THYROIDAB in the last 72 hours. Anemia Panel: No results for input(s): VITAMINB12, FOLATE, FERRITIN, TIBC, IRON, RETICCTPCT in the last 72 hours. Sepsis Labs: Recent Labs  Lab 07/20/19 2045  LATICACIDVEN 1.2    Recent Results (from the past 240 hour(s))  Blood culture (routine x  2)     Status: None (Preliminary result)   Collection Time: 07/20/19  8:45 PM   Specimen: BLOOD RIGHT HAND  Result Value Ref Range Status   Specimen Description BLOOD RIGHT HAND  Final   Special Requests   Final    BOTTLES DRAWN AEROBIC AND ANAEROBIC Blood Culture results may not be optimal due to an inadequate volume of blood received in culture bottles   Culture   Final    NO GROWTH 2 DAYS Performed at [facility], [place] [address] [place], [place] [address]    Report Status PENDING  Incomplete  SARS CORONAVIRUS 2 (TAT 6-24 HRS) Nasopharyngeal Nasopharyngeal Swab     Status: None   Collection Time: 07/20/19  9:03 PM   Specimen: Nasopharyngeal Swab  Result Value Ref Range Status   SARS Coronavirus 2 NEGATIVE NEGATIVE Final    Comment: (NOTE) SARS-CoV-2 target nucleic acids are NOT DETECTED. The SARS-CoV-2 RNA is generally detectable in upper and lower respiratory specimens during the acute phase of infection. Negative results do not preclude SARS-CoV-2 infection, do not rule out co-infections with other pathogens, and should not be used as the sole basis for treatment or other patient management decisions. Negative results must be combined with clinical observations, patient history, and epidemiological information. The expected result is Negative. Fact Sheet for Patients: SugarRoll.be Fact Sheet for Healthcare Providers: https://www.woods-mathews.com/ This test is not yet approved or cleared by the [place] FDA and  has been authorized for detection and/or diagnosis of SARS-CoV-2 by FDA under an Emergency Use Authorization (EUA). This EUA will remain  in effect (meaning this test can be used) for the duration of the COVID-19 declaration under Section 56 4(b)(1) of the Act, 21 U.S.C. section 360bbb-3(b)(1), unless the authorization is terminated or revoked sooner. Performed at [facility], [place] [address],  [place], [place] [address]   Blood culture (routine x 2)     Status: None (Preliminary result)   Collection Time: 07/20/19  9:09 PM   Specimen: BLOOD  Result Value Ref Range Status   Specimen Description BLOOD RIGHT ANTECUBITAL  Final   Special Requests   Final    BOTTLES DRAWN AEROBIC ONLY Blood Culture adequate volume   Culture   Final    NO GROWTH 2 DAYS Performed at [facility], [place] [address] [place], [place] [address]    Report Status PENDING  Incomplete  MRSA PCR Screening     Status: None   Collection Time: 07/20/19  9:23 PM   Specimen: Nasal Mucosa; Nasopharyngeal  Result Value Ref Range Status   MRSA by PCR NEGATIVE NEGATIVE Final    Comment:        The GeneXpert MRSA Assay (FDA approved for NASAL specimens only), is one component of a comprehensive MRSA colonization surveillance program. It is  not intended to diagnose MRSA infection nor to guide or monitor treatment for MRSA infections. Performed at [facility], [place] [address] [place], [place] [address]      Radiology Studies: Ct Abdomen Pelvis Wo Contrast  Result Date: 07/20/2019 CLINICAL DATA:  Left pelvic pain. Severe. Colonoscopy today. Previous pelvic abscess, percutaneous drainage on the right. EXAM: CT ABDOMEN AND PELVIS WITHOUT CONTRAST TECHNIQUE: Multidetector CT imaging of the abdomen and pelvis was performed following the standard protocol without IV contrast. COMPARISON:  CT 05/19/2019 FINDINGS: Lower chest: Linear lingular and right lower lobe atelectasis. No confluent airspace disease. No pleural fluid. Hepatobiliary: No focal liver abnormality is seen. No gallstones, gallbladder wall thickening, or biliary dilatation. Pancreas: No ductal dilatation or inflammation. Spleen: Normal in size without focal abnormality. Small splenule inferiorly. Adrenals/Urinary Tract: Normal adrenal glands. No hydronephrosis or perinephric edema. No renal stones. Ureters are decompressed. Urinary bladder is  minimally distended, mild wall thickening about the dome. Stomach/Bowel: Lack of IV and enteric contrast limits assessment. Stomach is nondistended. Small bowel is decompressed. Pelvic bowel loops are suboptimally assessed. Fluid throughout the colon with air-fluid levels. No obvious colonic wall thickening, sigmoid colon not well assessed. Irregular foci of air in the pelvis, difficult to confirm intraluminal bowel gas, for example image 81 series 3. Vascular/Lymphatic: Abdominal aorta is normal in caliber. Multiple prominent retroperitoneal lymph nodes which appears similar to prior exam under likely reactive. Reproductive: Intermediate density left adnexal lesion on prior exam persists, now with an air-fluid level, approximately 4.5 x 3.1 cm series 3, image 74. This may be contiguous with the right adnexa were there is irregular air and fluid, image 75 series 3. The uterus is poorly defined on the current exam. There is generalized fat stranding in the adnexa and pelvis. Other: Inflammatory process in the pelvis with fat stranding and free fluid. Adnexal structures are not well-defined. No upper abdominal ascites. No fluid collection in the right pericolic gutter at site of prior percutaneous strain. Musculoskeletal: There are no acute or suspicious osseous abnormalities. Scattered bone islands in the pelvis. IMPRESSION: 1. Patient with history complex bilateral adnexal masses, previously characterized as PID, indeterminate density lesions in the adnexa persist, however now contain air and air-fluid levels, and are poorly defined. There is generalized fat stranding in the pelvis with inflammation and free fluid. Findings are concerning for progression of tubo-ovarian abscess. Recommend GYN consultation. 2. Lack of IV and enteric contrast as well as inflammatory change limits detailed assessment of pelvic structures, including pelvic bowel loops. There are questionable foci of extraluminal air in the pelvis, which  may be related to the adnexal process. Given recent colonoscopy, bowel perforation is difficult to exclude on imaging findings alone, however there is no colonic wall thickening to suggest this. 3. Mild bladder wall thickening about the dome, likely reactive. 4. Prominent retroperitoneal lymph nodes are similar to prior exam under likely reactive. 5. No fluid collection in the right pericolic gutter at site of prior percutaneous strain. These results were called by telephone at the time of interpretation on 07/20/2019 at 7:57 pm to PA [facility] , who verbally acknowledged these results. Electronically Signed   By: Keith Rake M.D.   On: 07/20/2019 19:59      Scheduled Meds:  ketorolac  15 mg Intravenous Once   lidocaine  1 patch Transdermal Q24H   methocarbamol  500 mg Oral QID   metroNIDAZOLE  500 mg Oral Q12H   pantoprazole  40 mg Oral Daily  Continuous Infusions:  sodium chloride 100 mL/hr at 07/22/19 0831   piperacillin-tazobactam (ZOSYN)  IV 3.375 g (07/22/19 0830)     LOS: 2 days    Flora Lipps, MD [facility]  07/22/2019, 9:36 AM

## 2019-07-22 NOTE — Progress Notes (Signed)
HD #3 TOA S/p incomplete colonoscopy due to not fully prepped  S; c/o abdominal pain(+) gas  only having colon prep come out  O: BP (!) 134/91 (BP Location: Right Arm)   Pulse (!) 110   Temp 99 F (37.2 C) (Oral)   Resp 19   SpO2 99%  Lungs clear to A Cor RRR  abdomen: soft active BS. Non distended. Tender LLQ Pad none  ext no edema  IMP: TOA on Zosyn Flagyl for BV Abdominal pain  Currently appears related to significant gas P) simethicone QID( pt has to chew med. D/c doxycycline( causes her nausea) and resume flagyl. Ambulate. Recheck CBC in am Eventual surgery but prefer to cool pt down.  May transfer care under gyn. Warm heat to abdomen

## 2019-07-23 LAB — CBC WITH DIFFERENTIAL/PLATELET
Abs Immature Granulocytes: 0.06 10*3/uL (ref 0.00–0.07)
Basophils Absolute: 0.1 10*3/uL (ref 0.0–0.1)
Basophils Relative: 0 %
Eosinophils Absolute: 0 10*3/uL (ref 0.0–0.5)
Eosinophils Relative: 0 %
HCT: 26.3 % — ABNORMAL LOW (ref 36.0–46.0)
Hemoglobin: 8.6 g/dL — ABNORMAL LOW (ref 12.0–15.0)
Immature Granulocytes: 0 %
Lymphocytes Relative: 6 %
Lymphs Abs: 1 10*3/uL (ref 0.7–4.0)
MCH: 28.3 pg (ref 26.0–34.0)
MCHC: 32.7 g/dL (ref 30.0–36.0)
MCV: 86.5 fL (ref 80.0–100.0)
Monocytes Absolute: 0.7 10*3/uL (ref 0.1–1.0)
Monocytes Relative: 5 %
Neutro Abs: 13.8 10*3/uL — ABNORMAL HIGH (ref 1.7–7.7)
Neutrophils Relative %: 89 %
Platelets: 294 10*3/uL (ref 150–400)
RBC: 3.04 MIL/uL — ABNORMAL LOW (ref 3.87–5.11)
RDW: 15.8 % — ABNORMAL HIGH (ref 11.5–15.5)
WBC: 15.6 10*3/uL — ABNORMAL HIGH (ref 4.0–10.5)
nRBC: 0 % (ref 0.0–0.2)

## 2019-07-23 LAB — BASIC METABOLIC PANEL
Anion gap: 9 (ref 5–15)
BUN: <5 mg/dL — ABNORMAL LOW (ref 6–20)
CO2: 19 mmol/L — ABNORMAL LOW (ref 22–32)
Calcium: 7.8 mg/dL — ABNORMAL LOW (ref 8.9–10.3)
Chloride: 110 mmol/L (ref 98–111)
Creatinine, Ser: 0.78 mg/dL (ref 0.44–1.00)
GFR calc Af Amer: >60 mL/min (ref >60)
GFR calc non Af Amer: >60 mL/min (ref >60)
Glucose, Bld: 101 mg/dL — ABNORMAL HIGH (ref 70–99)
Potassium: 3 mmol/L — ABNORMAL LOW (ref 3.5–5.1)
Sodium: 138 mmol/L (ref 135–145)

## 2019-07-23 LAB — TYPE AND SCREEN
ABO/RH(D): O NEG
Antibody Screen: NEGATIVE

## 2019-07-23 LAB — MAGNESIUM: Magnesium: 1.4 mg/dL — ABNORMAL LOW (ref 1.7–2.4)

## 2019-07-23 MED ORDER — MAGNESIUM SULFATE 2 GM/50ML IV SOLN
2.0000 g | Freq: Once | INTRAVENOUS | Status: AC
  Administered 2019-07-23: 08:00:00 2 g via INTRAVENOUS
  Filled 2019-07-23: qty 50

## 2019-07-23 MED ORDER — DIPHENHYDRAMINE HCL 50 MG/ML IJ SOLN: 12.5000 mg | Freq: Four times a day (QID) | INTRAMUSCULAR | Status: DC | PRN

## 2019-07-23 MED ORDER — CLINDAMYCIN PHOSPHATE 900 MG/50ML IV SOLN
900.0000 mg | Freq: Three times a day (TID) | INTRAVENOUS | Status: DC
  Administered 2019-07-23 – 2019-08-05 (×38): 900 mg via INTRAVENOUS
  Filled 2019-07-23 (×41): qty 50

## 2019-07-23 MED ORDER — HYDROMORPHONE 1 MG/ML IV SOLN
INTRAVENOUS | Status: DC
  Administered 2019-07-23: 2.4 mg via INTRAVENOUS
  Administered 2019-07-23 – 2019-07-24 (×2): via INTRAVENOUS
  Administered 2019-07-24: 1.7 mg via INTRAVENOUS
  Administered 2019-07-25: 0.9 mg via INTRAVENOUS
  Administered 2019-07-25: 1.5 mg via INTRAVENOUS
  Administered 2019-07-25: 1.8 mg via INTRAVENOUS
  Administered 2019-07-25: 2.3 mg via INTRAVENOUS
  Administered 2019-07-25: 0.9 mg via INTRAVENOUS
  Administered 2019-07-25: 1.8 mg via INTRAVENOUS
  Administered 2019-07-26: 3.9 mg via INTRAVENOUS
  Administered 2019-07-26: 2.4 mg via INTRAVENOUS
  Administered 2019-07-26: 1.5 mg via INTRAVENOUS
  Filled 2019-07-23: qty 30

## 2019-07-23 MED ORDER — GENTAMICIN SULFATE 40 MG/ML IJ SOLN
5.0000 mg/kg | INTRAVENOUS | Status: DC
  Administered 2019-07-23: 370 mg via INTRAVENOUS
  Filled 2019-07-23: qty 9.25

## 2019-07-23 MED ORDER — SODIUM CHLORIDE 0.9 % IV SOLN
2.0000 g | Freq: Four times a day (QID) | INTRAVENOUS | Status: DC
  Administered 2019-07-23 – 2019-08-05 (×51): 2 g via INTRAVENOUS
  Filled 2019-07-23: qty 2000
  Filled 2019-07-23 (×4): qty 2
  Filled 2019-07-23: qty 2000
  Filled 2019-07-23 (×4): qty 2
  Filled 2019-07-23: qty 2000
  Filled 2019-07-23 (×3): qty 2
  Filled 2019-07-23: qty 2000
  Filled 2019-07-23: qty 2
  Filled 2019-07-23: qty 2000
  Filled 2019-07-23 (×2): qty 2
  Filled 2019-07-23: qty 2000
  Filled 2019-07-23 (×12): qty 2
  Filled 2019-07-23: qty 2000
  Filled 2019-07-23 (×6): qty 2
  Filled 2019-07-23: qty 2000
  Filled 2019-07-23 (×4): qty 2
  Filled 2019-07-23: qty 2000
  Filled 2019-07-23 (×5): qty 2
  Filled 2019-07-23 (×3): qty 2000

## 2019-07-23 MED ORDER — ONDANSETRON HCL 4 MG/2ML IJ SOLN
4.0000 mg | Freq: Four times a day (QID) | INTRAMUSCULAR | Status: DC | PRN
  Filled 2019-07-23: qty 2

## 2019-07-23 MED ORDER — LOPERAMIDE HCL 2 MG PO CAPS
4.0000 mg | ORAL_CAPSULE | ORAL | Status: DC | PRN
  Administered 2019-07-23: 11:00:00 4 mg via ORAL
  Filled 2019-07-23: qty 2

## 2019-07-23 MED ORDER — DIPHENHYDRAMINE HCL 12.5 MG/5ML PO ELIX: 12.5000 mg | ORAL_SOLUTION | Freq: Four times a day (QID) | ORAL | Status: DC | PRN

## 2019-07-23 MED ORDER — SODIUM CHLORIDE 0.9% FLUSH: 9.0000 mL | INTRAVENOUS | Status: DC | PRN

## 2019-07-23 MED ORDER — NALOXONE HCL 0.4 MG/ML IJ SOLN: 0.4000 mg | INTRAMUSCULAR | Status: DC | PRN

## 2019-07-23 MED ORDER — POTASSIUM CHLORIDE 2 MEQ/ML IV SOLN
INTRAVENOUS | Status: DC
  Administered 2019-07-23: 18:00:00 via INTRAVENOUS
  Filled 2019-07-23 (×2): qty 1000

## 2019-07-23 MED ORDER — POTASSIUM CHLORIDE CRYS ER 20 MEQ PO TBCR
40.0000 meq | EXTENDED_RELEASE_TABLET | Freq: Two times a day (BID) | ORAL | Status: DC
  Administered 2019-07-23: 08:00:00 40 meq via ORAL
  Filled 2019-07-23: qty 2

## 2019-07-23 MED ORDER — HYDROMORPHONE 1 MG/ML IV SOLN
INTRAVENOUS | Status: DC
  Filled 2019-07-23: qty 30

## 2019-07-23 NOTE — Progress Notes (Signed)
HD #4 TOA S/p incomplete colonoscopy due to not fully prepped  S; c/o watery diarrhea (+) flatus C/o pain med not working  O:  Patient Vitals for the past 24 hrs:  BP Temp Temp src Pulse Resp SpO2  07/23/19 0453 118/76 98.4 F (36.9 C) Oral (!) 107 18 99 %  07/22/19 2138 (!) 135/91 98.9 F (37.2 C) Oral (!) 107 18 100 %  07/22/19 1559 123/87 98.5 F (36.9 C) Oral (!) 109 17 100 %   Lungs clear to A Cor RRR  abdomen: soft  Diffuse active BS. Non distended. No rebound.  Tender LLQ Pad none  ext no edema  IMP: TOA on Zosyn Flagyl for BV coverage Abdominal pain with significant gas Hypokalemia related to diarrhea hypomagnesium P) await cbc. Replete potassium, magnesium. Cont mylicon.. Imodium.

## 2019-07-23 NOTE — Anesthesia Preprocedure Evaluation (Addendum)
Anesthesia Evaluation  Patient identified by MRN, date of birth, ID band Patient awake    Reviewed: Allergy & Precautions, NPO status , Patient's Chart, lab work & pertinent test results  History of Anesthesia Complications Negative for: history of anesthetic complications  Airway Mallampati: II  TM Distance: >3 FB Neck ROM: Full    Dental  (+) Dental Advisory Given, Teeth Intact   Pulmonary neg pulmonary ROS,    Pulmonary exam normal        Cardiovascular negative cardio ROS Normal cardiovascular exam     Neuro/Psych negative neurological ROS  negative psych ROS   GI/Hepatic Neg liver ROS,  IBS    Endo/Other   Hypokalemia   Renal/GU negative Renal ROS     Musculoskeletal negative musculoskeletal ROS (+)   Abdominal   Peds  Hematology  (+) anemia ,   Anesthesia Other Findings   Reproductive/Obstetrics  Endometriosis  Tuboovarian abscess                             Anesthesia Physical Anesthesia Plan  ASA: II  Anesthesia Plan: General   Post-op Pain Management:    Induction: Intravenous  PONV Risk Score and Plan: 4 or greater and Treatment may vary due to age or medical condition, Ondansetron, Midazolam, Dexamethasone and Scopolamine patch - Pre-op  Airway Management Planned: Oral ETT  Additional Equipment: None  Intra-op Plan:   Post-operative Plan: Extubation in OR  Informed Consent: I have reviewed the patients History and Physical, chart, labs and discussed the procedure including the risks, benefits and alternatives for the proposed anesthesia with the patient or authorized representative who has indicated his/her understanding and acceptance.     Dental advisory given  Plan Discussed with: CRNA and Anesthesiologist  Anesthesia Plan Comments:        Anesthesia Quick Evaluation

## 2019-07-23 NOTE — Progress Notes (Signed)
Pharmacy Antibiotic Note  Gwendolyn Nguyen is a 33 y.o. 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  Pharmacy has been consulted for gentamicin dosing (she is also on clindamycin and ampicillin) -WBC= 15.6, afeb, SCr= 0.78, CrCl ~ 100  Plan: -gentamycin 370mg  IV q24h -Will check a gentamicin level 10 hours today (due to failure of outpatient therapy)      Temp (24hrs), Avg:98.6 F (37 C), Min:98.4 F (36.9 C), Max:98.9 F (37.2 C)  Recent Labs  Lab 07/20/19 1634 07/20/19 2045 07/21/19 0107 07/23/19 0302 07/23/19 1142  WBC 16.9*  --  16.0*  --  15.6*  CREATININE 1.00  --  1.07* 0.78  --   LATICACIDVEN  --  1.2  --   --   --     Estimated Creatinine Clearance: 100.9 mL/min (by C-G formula based on SCr of 0.78 mg/dL).    Allergies  Allergen Reactions  . Gadolinium Derivatives Hives, Itching and Nausea And Vomiting    Pt vomited immediately during injection.  15 minutes later, pt developed hives and itching.   . Shellfish Allergy Hives  . Sulfa Antibiotics Other (See Comments)    Patient was told to NOT TAKE THIS  . Iohexol Hives and Rash    08-2018, rash and hives, needs pre meds S/W PATIENT STATED REACTION FROM CT CONTRAST 11/19    Antimicrobials this admission: 10/3 gent 10/3 clinda 10/3 ampicillin 10/1 flagyl>>1-/3 9/30 doxy>>10/2 9/30 zosyn>>10/13   Dose adjustments this admission:   Microbiology results: 9/30 blood x2- ngtd  Thank you for allowing pharmacy to be a part of this patient's care.  Hildred Laser, PharmD Clinical Pharmacist **Pharmacist phone directory can now be found on [email or web address] ([email or web address]).  Listed under [place].

## 2019-07-23 NOTE — Progress Notes (Signed)
Pt seen ( mother present) To disc surgery in light of ongoing pain as well no marked change in wbc since admission despite IV antibiotics CBC Latest Ref Rng & Units 07/23/2019 07/21/2019 07/20/2019  WBC 4.0 - 10.5 K/uL 15.6(H) 16.0(H) 16.9(H)  Hemoglobin 12.0 - 15.0 g/dL 8.6(L) 9.3(L) 11.2(L)  Hematocrit 36.0 - 46.0 % 26.3(L) 29.7(L) 34.2(L)  Platelets 150 - 400 K/uL 294 379 472(H)  recommended surgery: dx laparoscopy, poss exp lap, possible removal of one or both tubes. Pt and mother advised if both tubes are removed that she would be sterile and would need assisted reproduction for future pregnancy. 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  All ? Answered  will switch to A/G/C NOP. Dilaudid PCA pump for pain mgmt Pt c/o back pain, ovarian pain  And lack of sleep Consent signed. OR notified

## 2019-07-24 ENCOUNTER — Encounter (HOSPITAL_COMMUNITY): Payer: Self-pay

## 2019-07-24 ENCOUNTER — Inpatient Hospital Stay (HOSPITAL_COMMUNITY): Payer: BC Managed Care – PPO | Admitting: Anesthesiology

## 2019-07-24 ENCOUNTER — Encounter (HOSPITAL_COMMUNITY)
Admission: EM | Disposition: A | Discharge status: Home / Self Care | Payer: Self-pay | Source: Home / Self Care | Attending: Obstetrics and Gynecology

## 2019-07-24 DIAGNOSIS — N7093 Salpingitis and oophoritis, unspecified: Secondary | ICD-10-CM

## 2019-07-24 HISTORY — PX: LAPAROSCOPY: SHX197

## 2019-07-24 HISTORY — DX: Salpingitis and oophoritis, unspecified: N70.93

## 2019-07-24 HISTORY — PX: LAPAROSCOPIC LYSIS OF ADHESIONS: SHX5905

## 2019-07-24 LAB — GENTAMICIN LEVEL, RANDOM: Gentamicin Rm: 2.3 ug/mL

## 2019-07-24 SURGERY — LAPAROSCOPY, DIAGNOSTIC
Anesthesia: General

## 2019-07-24 MED ORDER — ROCURONIUM BROMIDE 10 MG/ML (PF) SYRINGE
PREFILLED_SYRINGE | INTRAVENOUS | Status: DC | PRN
  Administered 2019-07-24: 50 mg via INTRAVENOUS
  Administered 2019-07-24 (×2): 20 mg via INTRAVENOUS

## 2019-07-24 MED ORDER — FENTANYL CITRATE (PF) 250 MCG/5ML IJ SOLN
INTRAMUSCULAR | Status: DC | PRN
  Administered 2019-07-24 (×5): 50 ug via INTRAVENOUS
  Administered 2019-07-24: 25 ug via INTRAVENOUS
  Administered 2019-07-24 (×2): 50 ug via INTRAVENOUS
  Administered 2019-07-24: 25 ug via INTRAVENOUS
  Administered 2019-07-24 (×2): 50 ug via INTRAVENOUS

## 2019-07-24 MED ORDER — SODIUM CHLORIDE (PF) 0.9 % IJ SOLN
INTRAMUSCULAR | Status: DC | PRN
  Administered 2019-07-24: 10 mL

## 2019-07-24 MED ORDER — BUPIVACAINE HCL (PF) 0.25 % IJ SOLN
INTRAMUSCULAR | Status: AC
  Filled 2019-07-24: qty 30

## 2019-07-24 MED ORDER — SODIUM CHLORIDE 0.9 % IV SOLN
INTRAVENOUS | Status: DC | PRN
  Administered 2019-07-24: 100 mL

## 2019-07-24 MED ORDER — KCL-LACTATED RINGERS-D5W 20 MEQ/L IV SOLN
INTRAVENOUS | Status: DC
  Administered 2019-07-24 – 2019-08-03 (×17): via INTRAVENOUS
  Filled 2019-07-24 (×22): qty 1000

## 2019-07-24 MED ORDER — FENTANYL CITRATE (PF) 250 MCG/5ML IJ SOLN
INTRAMUSCULAR | Status: AC
  Filled 2019-07-24: qty 5

## 2019-07-24 MED ORDER — LIDOCAINE 2% (20 MG/ML) 5 ML SYRINGE
INTRAMUSCULAR | Status: AC
  Filled 2019-07-24: qty 5

## 2019-07-24 MED ORDER — MIDAZOLAM HCL 5 MG/5ML IJ SOLN
INTRAMUSCULAR | Status: DC | PRN
  Administered 2019-07-24: 2 mg via INTRAVENOUS

## 2019-07-24 MED ORDER — ONDANSETRON HCL 4 MG PO TABS: 4.0000 mg | ORAL_TABLET | Freq: Four times a day (QID) | ORAL | Status: DC | PRN

## 2019-07-24 MED ORDER — SODIUM CHLORIDE 0.9 % IR SOLN
Status: DC | PRN
  Administered 2019-07-24: 1000 mL

## 2019-07-24 MED ORDER — SODIUM CHLORIDE 0.9 % IV SOLN
INTRAVENOUS | Status: AC
  Filled 2019-07-24: qty 500000

## 2019-07-24 MED ORDER — PROPOFOL 10 MG/ML IV BOLUS
INTRAVENOUS | Status: AC
  Filled 2019-07-24: qty 20

## 2019-07-24 MED ORDER — ONDANSETRON HCL 4 MG/2ML IJ SOLN: 4.0000 mg | Freq: Four times a day (QID) | INTRAMUSCULAR | Status: DC | PRN

## 2019-07-24 MED ORDER — MENTHOL 3 MG MT LOZG: 1.0000 | LOZENGE | OROMUCOSAL | Status: DC | PRN

## 2019-07-24 MED ORDER — LACTATED RINGERS IV SOLN
INTRAVENOUS | Status: DC
  Administered 2019-07-24 (×2): via INTRAVENOUS

## 2019-07-24 MED ORDER — PANTOPRAZOLE SODIUM 40 MG PO TBEC
40.0000 mg | DELAYED_RELEASE_TABLET | Freq: Every day | ORAL | Status: DC
  Administered 2019-07-25 – 2019-08-05 (×12): 40 mg via ORAL
  Filled 2019-07-24 (×13): qty 1

## 2019-07-24 MED ORDER — ALBUMIN HUMAN 5 % IV SOLN
INTRAVENOUS | Status: DC | PRN
  Administered 2019-07-24: 09:00:00 via INTRAVENOUS

## 2019-07-24 MED ORDER — FENTANYL CITRATE (PF) 100 MCG/2ML IJ SOLN
25.0000 ug | INTRAMUSCULAR | Status: DC | PRN
  Administered 2019-07-24: 50 ug via INTRAVENOUS

## 2019-07-24 MED ORDER — DEXAMETHASONE SODIUM PHOSPHATE 10 MG/ML IJ SOLN
INTRAMUSCULAR | Status: AC
  Filled 2019-07-24: qty 1

## 2019-07-24 MED ORDER — SODIUM CHLORIDE (PF) 0.9 % IJ SOLN
INTRAMUSCULAR | Status: AC
  Filled 2019-07-24: qty 10

## 2019-07-24 MED ORDER — ONDANSETRON HCL 4 MG/2ML IJ SOLN
INTRAMUSCULAR | Status: DC | PRN
  Administered 2019-07-24: 4 mg via INTRAVENOUS

## 2019-07-24 MED ORDER — BUPIVACAINE HCL (PF) 0.25 % IJ SOLN
INTRAMUSCULAR | Status: DC | PRN
  Administered 2019-07-24: 9.5 mL

## 2019-07-24 MED ORDER — LIDOCAINE 2% (20 MG/ML) 5 ML SYRINGE
INTRAMUSCULAR | Status: DC | PRN
  Administered 2019-07-24: 80 mg via INTRAVENOUS

## 2019-07-24 MED ORDER — DEXAMETHASONE SODIUM PHOSPHATE 10 MG/ML IJ SOLN
INTRAMUSCULAR | Status: DC | PRN
  Administered 2019-07-24: 4 mg via INTRAVENOUS

## 2019-07-24 MED ORDER — HYDROMORPHONE HCL 1 MG/ML IJ SOLN
INTRAMUSCULAR | Status: DC | PRN
  Administered 2019-07-24: 0.5 mg via INTRAVENOUS

## 2019-07-24 MED ORDER — MIDAZOLAM HCL 2 MG/2ML IJ SOLN
INTRAMUSCULAR | Status: AC
  Filled 2019-07-24: qty 2

## 2019-07-24 MED ORDER — SIMETHICONE 80 MG PO CHEW
80.0000 mg | CHEWABLE_TABLET | Freq: Four times a day (QID) | ORAL | Status: DC | PRN
  Administered 2019-07-27 – 2019-07-30 (×4): 80 mg via ORAL
  Filled 2019-07-24 (×5): qty 1

## 2019-07-24 MED ORDER — FENTANYL CITRATE (PF) 100 MCG/2ML IJ SOLN
INTRAMUSCULAR | Status: AC
  Filled 2019-07-24: qty 2

## 2019-07-24 MED ORDER — ESMOLOL HCL 100 MG/10ML IV SOLN
INTRAVENOUS | Status: AC
  Filled 2019-07-24: qty 10

## 2019-07-24 MED ORDER — SUGAMMADEX SODIUM 200 MG/2ML IV SOLN
INTRAVENOUS | Status: DC | PRN
  Administered 2019-07-24: 150 mg via INTRAVENOUS

## 2019-07-24 MED ORDER — METOPROLOL TARTRATE 5 MG/5ML IV SOLN
INTRAVENOUS | Status: DC | PRN
  Administered 2019-07-24 (×3): 1 mg via INTRAVENOUS

## 2019-07-24 MED ORDER — PROPOFOL 10 MG/ML IV BOLUS
INTRAVENOUS | Status: DC | PRN
  Administered 2019-07-24: 200 mg via INTRAVENOUS

## 2019-07-24 MED ORDER — GENTAMICIN SULFATE 40 MG/ML IJ SOLN
7.0000 mg/kg | INTRAVENOUS | Status: DC
  Administered 2019-07-24 – 2019-07-29 (×6): 520 mg via INTRAVENOUS
  Filled 2019-07-24 (×6): qty 13

## 2019-07-24 MED ORDER — ESMOLOL HCL 100 MG/10ML IV SOLN
INTRAVENOUS | Status: DC | PRN
  Administered 2019-07-24 (×3): 10 mg via INTRAVENOUS

## 2019-07-24 MED ORDER — OXYCODONE HCL 5 MG PO TABS: 5.0000 mg | ORAL_TABLET | Freq: Once | ORAL | Status: DC | PRN

## 2019-07-24 MED ORDER — ONDANSETRON HCL 4 MG/2ML IJ SOLN
INTRAMUSCULAR | Status: AC
  Filled 2019-07-24: qty 2

## 2019-07-24 MED ORDER — METOPROLOL TARTRATE 5 MG/5ML IV SOLN
INTRAVENOUS | Status: AC
  Filled 2019-07-24: qty 5

## 2019-07-24 MED ORDER — ROCURONIUM BROMIDE 10 MG/ML (PF) SYRINGE
PREFILLED_SYRINGE | INTRAVENOUS | Status: AC
  Filled 2019-07-24: qty 10

## 2019-07-24 MED ORDER — HYDROMORPHONE HCL 1 MG/ML IJ SOLN
INTRAMUSCULAR | Status: AC
  Filled 2019-07-24: qty 0.5

## 2019-07-24 MED ORDER — OXYCODONE HCL 5 MG/5ML PO SOLN: 5.0000 mg | Freq: Once | ORAL | Status: DC | PRN

## 2019-07-24 MED ORDER — PROMETHAZINE HCL 25 MG/ML IJ SOLN: 6.2500 mg | INTRAMUSCULAR | Status: DC | PRN

## 2019-07-24 SURGICAL SUPPLY — 60 items
ADH SKN CLS APL DERMABOND .7 (GAUZE/BANDAGES/DRESSINGS) ×2
BAG SPEC RTRVL LRG 6X4 10 (ENDOMECHANICALS) ×2
BAG URINE DRAINAGE (UROLOGICAL SUPPLIES) ×4 IMPLANT
CABLE HIGH FREQUENCY MONO STRZ (ELECTRODE) IMPLANT
CANISTER SUCT 3000ML PPV (MISCELLANEOUS) ×4 IMPLANT
CATH FOLEY 3WAY  5CC 16FR (CATHETERS) ×2
CATH FOLEY 3WAY 5CC 16FR (CATHETERS) ×2 IMPLANT
CATH ROBINSON RED A/P 16FR (CATHETERS) ×4 IMPLANT
COVER WAND RF STERILE (DRAPES) ×4 IMPLANT
DECANTER SPIKE VIAL GLASS SM (MISCELLANEOUS) ×4 IMPLANT
DERMABOND ADVANCED (GAUZE/BANDAGES/DRESSINGS) ×2
DERMABOND ADVANCED .7 DNX12 (GAUZE/BANDAGES/DRESSINGS) ×2 IMPLANT
DRAIN JACKSON PRATT 10MM FLAT (MISCELLANEOUS) ×3 IMPLANT
DRAPE WARM FLUID 44X44 (DRAPES) ×3 IMPLANT
DRSG OPSITE POSTOP 3X4 (GAUZE/BANDAGES/DRESSINGS) ×3 IMPLANT
EVACUATOR SILICONE 100CC (DRAIN) ×3 IMPLANT
FILTER SMOKE EVAC LAPAROSHD (FILTER) ×4 IMPLANT
GAUZE 4X4 16PLY RFD (DISPOSABLE) ×3 IMPLANT
GLOVE BIOGEL PI IND STRL 7.0 (GLOVE) ×6 IMPLANT
GLOVE BIOGEL PI INDICATOR 7.0 (GLOVE) ×6
GLOVE ECLIPSE 6.5 STRL STRAW (GLOVE) ×4 IMPLANT
GOWN STRL REUS W/ TWL LRG LVL3 (GOWN DISPOSABLE) ×6 IMPLANT
GOWN STRL REUS W/TWL LRG LVL3 (GOWN DISPOSABLE) ×12
HIBICLENS CHG 4% 4OZ BTL (MISCELLANEOUS) ×4 IMPLANT
KIT TURNOVER KIT B (KITS) ×4 IMPLANT
NDL INSUFFLATION 14GA 120MM (NEEDLE) ×1 IMPLANT
NEEDLE INSUFFLATION 14GA 120MM (NEEDLE) ×4 IMPLANT
NS IRRIG 1000ML POUR BTL (IV SOLUTION) ×4 IMPLANT
PACK LAPAROSCOPY BASIN (CUSTOM PROCEDURE TRAY) ×4 IMPLANT
PACK TRENDGUARD 450 HYBRID PRO (MISCELLANEOUS) ×1 IMPLANT
PAD ARMBOARD 7.5X6 YLW CONV (MISCELLANEOUS) ×4 IMPLANT
PAD OB MATERNITY 4.3X12.25 (PERSONAL CARE ITEMS) ×4 IMPLANT
PENCIL BUTTON HOLSTER BLD 10FT (ELECTRODE) ×3 IMPLANT
PLUG CATH AND CAP STER (CATHETERS) ×4 IMPLANT
POUCH SPECIMEN RETRIEVAL 10MM (ENDOMECHANICALS) ×3 IMPLANT
PROTECTOR NERVE ULNAR (MISCELLANEOUS) ×8 IMPLANT
SCISSORS LAP 5X35 DISP (ENDOMECHANICALS) ×3 IMPLANT
SET IRRIG TUBING LAPAROSCOPIC (IRRIGATION / IRRIGATOR) ×3 IMPLANT
SET TUBE SMOKE EVAC HIGH FLOW (TUBING) ×4 IMPLANT
SLEEVE ENDOPATH XCEL 5M (ENDOMECHANICALS) ×4 IMPLANT
SOLUTION ELECTROLUBE (MISCELLANEOUS) IMPLANT
SPECIMEN JAR MEDIUM (MISCELLANEOUS) ×4 IMPLANT
SPONGE LAP 18X18 RF (DISPOSABLE) ×5 IMPLANT
SUT PLAIN 2 0 (SUTURE) ×4
SUT PLAIN ABS 2-0 CT1 27XMFL (SUTURE) ×1 IMPLANT
SUT SILK 2 0 SH (SUTURE) ×3 IMPLANT
SUT VICRYL 0 UR6 27IN ABS (SUTURE) ×4 IMPLANT
SUT VICRYL 4-0 PS2 18IN ABS (SUTURE) ×4 IMPLANT
SYR 50ML LL SCALE MARK (SYRINGE) ×3 IMPLANT
SYR CONTROL 10ML LL (SYRINGE) ×4 IMPLANT
TOWEL GREEN STERILE FF (TOWEL DISPOSABLE) ×8 IMPLANT
TRENDGUARD 450 HYBRID PRO PACK (MISCELLANEOUS) ×4
TROCAR BALLN 12MMX100 BLUNT (TROCAR) ×3 IMPLANT
TROCAR OPTI TIP 5M 100M (ENDOMECHANICALS) ×4 IMPLANT
TROCAR XCEL DIL TIP R 11M (ENDOMECHANICALS) ×4 IMPLANT
TROCAR XCEL NON-BLD 5MMX100MML (ENDOMECHANICALS) ×3 IMPLANT
TUBE CONNECTING 20'X1/4 (TUBING) ×1
TUBE CONNECTING 20X1/4 (TUBING) ×2 IMPLANT
WARMER LAPAROSCOPE (MISCELLANEOUS) ×4 IMPLANT
YANKAUER SUCT BULB TIP NO VENT (SUCTIONS) ×3 IMPLANT

## 2019-07-24 NOTE — Progress Notes (Signed)
Pharmacy Antibiotic Note  Gwendolyn Nguyen is a 33 y.o. 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  Pharmacy has been consulted for gentamicin dosing (she is also on clindamycin and ampicillin) 10 hr level 2.3 mg/L which is very low therapeutic on the nomogram  Plan: -Increase gentamicin 520 mg IV q24h F/u renal function, clinical course    Temp (24hrs), Avg:98.5 F (36.9 C), Min:98.4 F (36.9 C), Max:98.6 F (37 C)  Recent Labs  Lab 07/20/19 1634 07/20/19 2045 07/21/19 0107 07/23/19 0302 07/23/19 1142 07/24/19 0342  WBC 16.9*  --  16.0*  --  15.6*  --   CREATININE 1.00  --  1.07* 0.78  --   --   LATICACIDVEN  --  1.2  --   --   --   --   GENTRANDOM  --   --   --   --   --  2.3    Estimated Creatinine Clearance: 100.9 mL/min (by C-G formula based on SCr of 0.78 mg/dL).    Allergies  Allergen Reactions  . Gadolinium Derivatives Hives, Itching and Nausea And Vomiting    Pt vomited immediately during injection.  15 minutes later, pt developed hives and itching.   . Shellfish Allergy Hives  . Sulfa Antibiotics Other (See Comments)    Patient was told to NOT TAKE THIS  . Iohexol Hives and Rash    08-2018, rash and hives, needs pre meds S/W PATIENT STATED REACTION FROM CT CONTRAST 11/19    Antimicrobials this admission: 10/3 gent 10/3 clinda 10/3 ampicillin 10/1 flagyl>>1-/3 9/30 doxy>>10/2 9/30 zosyn>>10/13   Dose adjustments this admission:  10/4  Increase gent 5mg /kg>>7mg /kg Microbiology results: 9/30 blood x2- ngtd  Thank you for allowing pharmacy to be a part of this patient's care. Excell Seltzer, PharmD

## 2019-07-24 NOTE — Anesthesia Postprocedure Evaluation (Signed)
Anesthesia Post Note  Patient: Gwendolyn Nguyen  Procedure(s) Performed: LAPAROSCOPY DIAGNOSTIC (N/A ) Laparoscopic Lysis Of Adhesions, Drainage of Pelvic Abscess (N/A ) Laparoscopic Lysis Of Adhesions and Assist With Drainage of Pelvic Abscess (N/A )     Patient location during evaluation: PACU Anesthesia Type: General Level of consciousness: awake and alert Pain management: pain level controlled Vital Signs Assessment: post-procedure vital signs reviewed and stable Respiratory status: spontaneous breathing, nonlabored ventilation, respiratory function stable and patient connected to nasal cannula oxygen Cardiovascular status: blood pressure returned to baseline and stable Postop Assessment: no apparent nausea or vomiting Anesthetic complications: no    Last Vitals:  Vitals:   07/24/19 1214 07/24/19 1226  BP:  (!) 143/108  Pulse:  86  Resp: 12 15  Temp:  (!) 36.4 C  SpO2: 100% 100%                   Audry Pili

## 2019-07-24 NOTE — Brief Op Note (Signed)
07/20/2019 - 07/24/2019  11:03 AM  PATIENT:  Gwendolyn Nguyen  33 y.o. female  PRE-OPERATIVE DIAGNOSIS:  Tubo ovarian abscesses, LLQ pain.  POST-OPERATIVE DIAGNOSIS:  TOA, ABDOMINO-PELVIC ADHESIONS  PROCEDURE:  Procedure(s): LAPAROSCOPY DIAGNOSTIC (N/A) Laparoscopic Lysis Of Adhesions and Assist With Drainage of Pelvic Abscess (N/A) Laparoscopic Lysis Of Adhesions, Drainage of Pelvic Abscess (N/A)  SURGEON:  Surgeon(s) and Role: Panel 1:    * Servando Salina, MD - Primary    * Georganna Skeans, MD - Assisting    * Almquist, Earlyne Iba, MD - Assisting Panel 2:    * Georganna Skeans, MD - Primary    * Servando Salina, MD - Assisting    * Almquist, Earlyne Iba, MD - Assisting  PHYSICIAN ASSISTANT:   ASSISTANTS: susan almquist   ANESTHESIA:   general  EBL:  20 mL   BLOOD ADMINISTERED:none  DRAINS: none   LOCAL MEDICATIONS USED:  MARCAINE     SPECIMEN:  No Specimen  DISPOSITION OF SPECIMEN:  N/A  COUNTS:  YES  TOURNIQUET:  * No tourniquets in log *  DICTATION: .Other Dictation: Dictation Number [id number]  PLAN OF CARE: Admit to inpatient   PATIENT DISPOSITION:  PACU - hemodynamically stable.   Delay start of Pharmacological VTE agent (>24hrs) due to surgical blood loss or risk of bleeding: no

## 2019-07-24 NOTE — Anesthesia Procedure Notes (Signed)
Procedure Name: Intubation Date/Time: 07/24/2019 8:27 AM Performed by: Amadeo Garnet, CRNA Pre-anesthesia Checklist: Patient identified, Emergency Drugs available, Suction available, Patient being monitored and Timeout performed Patient Re-evaluated:Patient Re-evaluated prior to induction Oxygen Delivery Method: Circle system utilized Preoxygenation: Pre-oxygenation with 100% oxygen Induction Type: IV induction Ventilation: Mask ventilation without difficulty Laryngoscope Size: Mac and 3 Grade View: Grade I Tube type: Oral Number of attempts: 1 Airway Equipment and Method: Stylet Placement Confirmation: ETT inserted through vocal cords under direct vision,  positive ETCO2 and breath sounds checked- equal and bilateral Secured at: 21 cm Tube secured with: Tape Dental Injury: Teeth and Oropharynx as per pre-operative assessment

## 2019-07-24 NOTE — Op Note (Addendum)
  07/20/2019 - 07/24/2019  10:20 AM  PATIENT:  Gwendolyn Nguyen  33 y.o. female  PRE-OPERATIVE DIAGNOSIS:  Tubo ovarian abscesses  POST-OPERATIVE DIAGNOSIS:    PROCEDURE:  Procedure(s):  Laparoscopic Lysis Of Adhesions 52min, Drainage of Pelvic Abscess  SURGEON:  Georganna Skeans, MD  ASSISTANTS: Servando Salina, MD  ANESTHESIA:   local and general  EBL:  Total I/O In: 1250 [I.V.:1000; IV Piggyback:250] Out: 700 [Urine:700]  BLOOD ADMINISTERED:none  DRAINS: (1) Jackson-Pratt drain(s) with closed bulb suction in the LLQ and .   SPECIMEN:  No Specimen  DISPOSITION OF SPECIMEN:  N/A  COUNTS:  YES  DICTATION: .Dragon Dictation I was called for an intraoperative consult on this patient.  Dr. cousins is doing laparoscopic drainage of tubo-ovarian abscess.  She encountered multiple adhesions and requested Intra-Op consult.  On my arrival, there was a single port in the abdomen and I could see a lot of omental adhesions.  We placed 2 additional 5 mm ports, one in the left lower quadrant and one in the right lower quadrant.  Lysis of adhesions was then carefully done and taking down multiple omental adhesions and some filmy adhesions to small bowel.  Cautery was used to get good hemostasis of the omental adhesions.  There was no complication from that.  We identified the area of dense inflammation in the left lower quadrant.  We gently dissected this laterally and entered an area of pus.  This was thoroughly irrigated.  We were only able to mobilize that area slightly due to dense inflammation.  The colon running near there was okay.  Dr. cousins did a bimanual exam as well, please see her dictation.  So we irrigated the area thoroughly and no further pus was draining.  We then irrigated with antibiotic solution.  Dr. cousins then placed a JP drain down to drain this area.  That was brought out through the left lower quadrant port site.  This completed my portion of the procedure.  The  patient remained in the operating room with Dr. Garwin Brothers. PATIENT DISPOSITION:  remains in OR with Dr. Garwin Brothers   Delay start of Pharmacological VTE agent (>24hrs) due to surgical blood loss or risk of bleeding:  no  Georganna Skeans, MD, MPH, FACS Pager: [phone]  10/4/202010:20 AM

## 2019-07-24 NOTE — Transfer of Care (Signed)
Immediate Anesthesia Transfer of Care Note  Patient: Gwendolyn Nguyen  Procedure(s) Performed: LAPAROSCOPY DIAGNOSTIC (N/A ) Laparoscopic Lysis Of Adhesions, Drainage of Pelvic Abscess (N/A ) Laparoscopic Lysis Of Adhesions and Assist With Drainage of Pelvic Abscess (N/A )  Patient Location: PACU  Anesthesia Type:General  Level of Consciousness: awake, alert  and oriented  Airway & Oxygen Therapy: Patient Spontanous Breathing and Patient connected to nasal cannula oxygen  Post-op Assessment: Report given to RN, Post -op Vital signs reviewed and stable and Patient moving all extremities  Post vital signs: Reviewed and stable  Last Vitals:  Vitals Value Taken Time  BP 140/102 07/24/19 1136  Temp    Pulse 89 07/24/19 1138  Resp 9 07/24/19 1138  SpO2 100 % 07/24/19 1138  Vitals shown include unvalidated device data.  Last Pain:  Vitals:   07/24/19 0744  TempSrc:   PainSc: 6       Patients Stated Pain Goal: 8 ([id number])  Complications: No apparent anesthesia complications

## 2019-07-25 ENCOUNTER — Encounter (HOSPITAL_COMMUNITY): Payer: Self-pay | Admitting: Obstetrics and Gynecology

## 2019-07-25 ENCOUNTER — Ambulatory Visit: Payer: BC Managed Care – PPO | Admitting: Family Medicine

## 2019-07-25 ENCOUNTER — Encounter: Payer: BC Managed Care – PPO | Admitting: Physical Therapy

## 2019-07-25 LAB — BASIC METABOLIC PANEL
Anion gap: 7 (ref 5–15)
BUN: <5 mg/dL — ABNORMAL LOW (ref 6–20)
CO2: 26 mmol/L (ref 22–32)
Calcium: 8.7 mg/dL — ABNORMAL LOW (ref 8.9–10.3)
Chloride: 106 mmol/L (ref 98–111)
Creatinine, Ser: 0.89 mg/dL (ref 0.44–1.00)
GFR calc Af Amer: >60 mL/min (ref >60)
GFR calc non Af Amer: >60 mL/min (ref >60)
Glucose, Bld: 113 mg/dL — ABNORMAL HIGH (ref 70–99)
Potassium: 3.3 mmol/L — ABNORMAL LOW (ref 3.5–5.1)
Sodium: 139 mmol/L (ref 135–145)

## 2019-07-25 LAB — CULTURE, BLOOD (ROUTINE X 2)
Culture: NO GROWTH
Culture: NO GROWTH
Special Requests: ADEQUATE

## 2019-07-25 LAB — POCT I-STAT EG7
Acid-Base Excess: 2 mmol/L (ref 0.0–2.0)
Bicarbonate: 26.9 mmol/L (ref 20.0–28.0)
Calcium, Ion: 1.3 mmol/L (ref 1.15–1.40)
HCT: 30 % — ABNORMAL LOW (ref 36.0–46.0)
Hemoglobin: 10.2 g/dL — ABNORMAL LOW (ref 12.0–15.0)
O2 Saturation: 26 %
Potassium: 4.2 mmol/L (ref 3.5–5.1)
Sodium: 141 mmol/L (ref 135–145)
TCO2: 28 mmol/L (ref 22–32)
pCO2, Ven: 42.7 mmHg — ABNORMAL LOW (ref 44.0–60.0)
pH, Ven: 7.406 (ref 7.250–7.430)
pO2, Ven: 18 mmHg — CL (ref 32.0–45.0)

## 2019-07-25 LAB — CBC
HCT: 26.3 % — ABNORMAL LOW (ref 36.0–46.0)
Hemoglobin: 8.3 g/dL — ABNORMAL LOW (ref 12.0–15.0)
MCH: 27.3 pg (ref 26.0–34.0)
MCHC: 31.6 g/dL (ref 30.0–36.0)
MCV: 86.5 fL (ref 80.0–100.0)
Platelets: 338 10*3/uL (ref 150–400)
RBC: 3.04 MIL/uL — ABNORMAL LOW (ref 3.87–5.11)
RDW: 15.8 % — ABNORMAL HIGH (ref 11.5–15.5)
WBC: 13.3 10*3/uL — ABNORMAL HIGH (ref 4.0–10.5)
nRBC: 0.2 % (ref 0.0–0.2)

## 2019-07-25 MED ORDER — ACETAMINOPHEN 325 MG PO TABS
650.0000 mg | ORAL_TABLET | Freq: Four times a day (QID) | ORAL | Status: DC
  Administered 2019-07-25 – 2019-08-05 (×40): 650 mg via ORAL
  Filled 2019-07-25 (×44): qty 2

## 2019-07-25 MED ORDER — ENOXAPARIN SODIUM 40 MG/0.4ML ~~LOC~~ SOLN
40.0000 mg | SUBCUTANEOUS | Status: DC
  Administered 2019-07-25 – 2019-07-31 (×7): 40 mg via SUBCUTANEOUS
  Filled 2019-07-25 (×7): qty 0.4

## 2019-07-25 NOTE — Progress Notes (Addendum)
[facility]/Trauma Progress Note  1 Day Post-Op   Assessment/Plan Gwendolyn Dockeryis a 33 y.o.femalewith a medical history significant ofPID with complicated B TOAs, s/p IV ABx and perc drainage back in July of this year, also had SBO during that admit. Cultures with no growth from that time. Does have h/o gonorrhea in past couple of years ago.  Recurrent TOA - S/P dx laparoscopy, Laparoscopic Lysis Of Adhesions 51min, Drainage of Pelvic Abscess, Dr. Grandville Silos and Dr. Garwin Brothers. 10/04 - drain in place and is serosanguinous - pt has not had flatus, not eating - am labs - continue PCA for pain control, added tylenol for additional pain control.   FEN: reg diet VTE: SCD's, lovenox ID: Ampicillin & Clindamycin 10/03>>    Gentamicin 10/04>> WBC 13.3, afebrile, not tachy, antibiotics per GYN Foley: none Follow up: TBD    LOS: 5 days    Subjective: CC: abdominal pain  Pt states pressure in lower abdomen after urination but no burning while urinating. She feels pressure/pain in her rectum. No flatus or BM since surgery. Mild nausea this am but no vomiting. She is not eating much at all. Just some liquids.   Objective: Vital signs in last 24 hours: Temp:  [97.5 F (36.4 C)-98.5 F (36.9 C)] 98.4 F (36.9 C) (10/05 0944) Pulse Rate:  [83-104] 104 (10/05 0944) Resp:  [12-19] 18 (10/05 0944) BP: (126-143)/(85-108) 133/96 (10/05 0944) SpO2:  [96 %-100 %] 100 % (10/05 0944) Last BM Date: 07/24/19  Intake/Output from previous day: 10/04 0701 - 10/05 0700 In: 5393.3 [P.O.:240; I.V.:4390.2; IV Piggyback:763] Out: 2560 [Urine:2500; Drains:40; Blood:20] Intake/Output this shift: No intake/output data recorded.  PE: Gen:  Alert, NAD, pleasant, cooperative Pulm:  Rate and effort normal Abd: Soft, ND, +BS, incisions C/D/I, drain with minimal serosanguinous drainage, generalized TTP with mild guarding. No peritonitis  Skin: no rashes noted, warm and  dry   Anti-infectives: Anti-infectives (From admission, onward)   Start     Dose/Rate Route Frequency Ordered Stop   07/24/19 1730  gentamicin (GARAMYCIN) 520 mg in dextrose 5 % 100 mL IVPB     7 mg/kg  74.6 kg 113 mL/hr over 60 Minutes Intravenous Every 24 hours 07/24/19 0515     07/24/19 1010  polymyxin B 500,000 Units, bacitracin 50,000 Units in sodium chloride 0.9 % 500 mL irrigation  Status:  Discontinued       As needed 07/24/19 1010 07/24/19 1116   07/23/19 1730  clindamycin (CLEOCIN) IVPB 900 mg     900 mg 100 mL/hr over 30 Minutes Intravenous Every 8 hours 07/23/19 1632     07/23/19 1730  gentamicin (GARAMYCIN) 370 mg in dextrose 5 % 100 mL IVPB  Status:  Discontinued     5 mg/kg  74.6 kg 109.3 mL/hr over 60 Minutes Intravenous Every 24 hours 07/23/19 1632 07/24/19 0515   07/23/19 1730  ampicillin (OMNIPEN) 2 g in sodium chloride 0.9 % 100 mL IVPB     2 g 300 mL/hr over 20 Minutes Intravenous Every 6 hours 07/23/19 1632     07/22/19 1400  metroNIDAZOLE (FLAGYL) tablet 500 mg  Status:  Discontinued     500 mg Oral Every 12 hours 07/22/19 1244 07/23/19 1632   07/22/19 1030  doxycycline (VIBRA-TABS) tablet 100 mg  Status:  Discontinued     100 mg Oral Every 12 hours 07/22/19 0952 07/22/19 1244   07/21/19 1100  metroNIDAZOLE (FLAGYL) tablet 500 mg  Status:  Discontinued     500 mg  Oral Every 12 hours 07/21/19 1008 07/22/19 0952   07/20/19 2200  doxycycline (VIBRAMYCIN) 100 mg in sodium chloride 0.9 % 250 mL IVPB  Status:  Discontinued     100 mg 125 mL/hr over 120 Minutes Intravenous Every 12 hours 07/20/19 2103 07/21/19 1008   07/20/19 2100  piperacillin-tazobactam (ZOSYN) IVPB 3.375 g  Status:  Discontinued     3.375 g 12.5 mL/hr over 240 Minutes Intravenous Every 8 hours 07/20/19 2044 07/23/19 1632   07/20/19 2045  metroNIDAZOLE (FLAGYL) IVPB 500 mg     500 mg 100 mL/hr over 60 Minutes Intravenous  Once 07/20/19 2040 07/20/19 2330      Lab Results:  Recent Labs     07/23/19 1142 07/24/19 0811 07/25/19 0316  WBC 15.6*  --  13.3*  HGB 8.6* 10.2* 8.3*  HCT 26.3* 30.0* 26.3*  PLT 294  --  338   BMET Recent Labs    07/23/19 0302 07/24/19 0811  NA 138 141  K 3.0* 4.2  CL 110  --   CO2 19*  --   GLUCOSE 101*  --   BUN <5*  --   CREATININE 0.78  --   CALCIUM 7.8*  --    PT/INR No results for input(s): LABPROT, INR in the last 72 hours. CMP     Component Value Date/Time   NA 141 07/24/2019 0811   K 4.2 07/24/2019 0811   CL 110 07/23/2019 0302   CO2 19 (L) 07/23/2019 0302   GLUCOSE 101 (H) 07/23/2019 0302   BUN <5 (L) 07/23/2019 0302   CREATININE 0.78 07/23/2019 0302   CALCIUM 7.8 (L) 07/23/2019 0302   PROT 7.4 07/20/2019 1634   ALBUMIN 3.8 07/20/2019 1634   AST 32 07/20/2019 1634   ALT 40 07/20/2019 1634   ALKPHOS 50 07/20/2019 1634   BILITOT 0.4 07/20/2019 1634   GFRNONAA >60 07/23/2019 0302   GFRAA >60 07/23/2019 0302   Lipase     Component Value Date/Time   LIPASE 27 07/20/2019 1634    Studies/Results: No results found.   Kalman Drape, [facility] Pager [phone] Cristine Polio, & Friday 7:00am - 4:30pm Thursdays 7:00am -11:30am

## 2019-07-25 NOTE — Progress Notes (Signed)
Patient ambulated entire hall with walker and standby assist.

## 2019-07-25 NOTE — Progress Notes (Addendum)
Subjective: Patient reports incisional pain and no problems voiding.  Clear liquid Reviewed intraop findings Objective: I have reviewed patient's vital signs.  vital signs, intake and output and labs. Vitals:   07/25/19 0831 07/25/19 0944  BP:  (!) 133/96  Pulse:  (!) 104  Resp: 14 18  Temp:  98.4 F (36.9 C)  SpO2: 98% 100%   I/O last 3 completed shifts: In: 10751.2 [P.O.:360; I.V.:9169.2; IV Piggyback:1222] Out: 2560 [Urine:2500; Drains:40; Blood:20] No intake/output data recorded.  Lab Results  Component Value Date   WBC 13.3 (H) 07/25/2019   HGB 8.3 (L) 07/25/2019   HCT 26.3 (L) 07/25/2019   MCV 86.5 07/25/2019   PLT 338 07/25/2019   Lab Results  Component Value Date   CREATININE 0.78 07/23/2019    EXAM General: alert, cooperative and no distress Resp: clear to auscultation bilaterally Cardio: regular rate and rhythm, S1, S2 normal, no murmur, click, rub or gallop GI: soft nondistended (+) BS JP LLQ Extremities: no edema, redness or tenderness in the calves or thighs Vaginal Bleeding: minimal  Assessment: s/p Procedure(s): LAPAROSCOPY DIAGNOSTIC Laparoscopic Lysis Of Adhesions and Assist With Drainage of Pelvic Abscess Laparoscopic Lysis Of Adhesions, Drainage of Pelvic Abscess: stable and not tolerating diet TOA on A/G/C Plan: Advance diet Encourage ambulation  Cont IV antibiotic. Cont PCA. Encourage ambulation( pt upset regarding that request)  LOS: 5 days    Marvene Staff, MD 07/25/2019 12:11 PM    07/25/2019, 12:11 PM

## 2019-07-25 NOTE — Op Note (Signed)
NAME: Gwendolyn Nguyen, Gwendolyn Nguyen MEDICAL RECORD [id number] ACCOUNT [id number] DATE OF BIRTH:11/05/1985 FACILITY: [facility] LOCATION: [facility] PHYSICIAN:Valgene Deloatch A. Aspynn Clover, MD  OPERATIVE REPORT  DATE OF PROCEDURE:  07/24/2019  PREOPERATIVE DIAGNOSES:  Persistent left lower quadrant pain, left adnexal mass, tubo-ovarian abscess.  PROCEDURE:  Diagnostic laparoscopy.  POSTOPERATIVE DIAGNOSES:  Tubo-ovarian abscess; abdominal pelvic adhesions, severe; left adnexal mass.  ANESTHESIA:  General.  SURGEON:  Servando Salina, MD  ASSISTANT:  Tiana Loft, MD  INTRAOPERATIVE consult Surgeon:  Georganna Skeans, MD  DESCRIPTION OF PROCEDURE:  Under adequate general anesthesia, the patient was placed in the dorsal lithotomy position.  Examination under anesthesia revealed on the rectovaginal exam a firm, fixed, left adnexal mass.  The patient had an indwelling Foley  catheter placed sterilely.  The patient was sterilely prepped and draped in the usual manner.  A bivalve speculum was placed in the vagina.  A single tooth was placed on the anterior lip of the cervix.  An acorn cannula was introduced into the cervical  os for manipulation of the uterus.  Bivalve speculum was removed.  Attention was then turned to the abdomen.  0.25% was injected infraumbilically.  An infraumbilical transverse incision was made and carried down to the rectus fascia, which was opened  transversely, as well as the peritoneum was entered at that time.  A 0 Vicryl pursestring suture was placed, and a Hasson cannula was introduced into the other port.  The abdomen was insufflated.  A lighted video laparoscope was inserted.  On entering  the cavity, there were diffuse bowel adhesions throughout the abdomen from the omentum to the anterior abdominal wall, and inability to see the pelvis at all due to adhesions from the bowels as well.  Given these findings, intraoperative surgical consult was  obtained from Dr. Georganna Skeans, who  kindly came in.  Please see his operative report for the details.  The incisions were subsequently closed with pursestring closure of the infraumbilical site, 4-0 Vicryl subcuticular closure on the infraumbilical  incision and the right lower quadrant incision, and 2-0 silk placed into a fixation of the JP drain that was placed.  The instruments from the vagina were removed.  SPECIMEN:  None.  ESTIMATED BLOOD LOSS:  20 mL.  COMPLICATIONS:  None.  DISPOSITION:  The patient tolerated the procedure well and was transferred to recovery room in stable condition.  LN/NUANCE  D:07/24/2019 T:07/25/2019 JOB:[id number]

## 2019-07-26 MED ORDER — POTASSIUM CHLORIDE CRYS ER 20 MEQ PO TBCR
40.0000 meq | EXTENDED_RELEASE_TABLET | Freq: Two times a day (BID) | ORAL | Status: DC
  Administered 2019-07-26 – 2019-07-27 (×3): 40 meq via ORAL
  Filled 2019-07-26 (×3): qty 2

## 2019-07-26 MED ORDER — OXYCODONE HCL 5 MG PO TABS
5.0000 mg | ORAL_TABLET | ORAL | Status: DC | PRN
  Administered 2019-07-27 – 2019-08-03 (×30): 10 mg via ORAL
  Administered 2019-08-04: 5 mg via ORAL
  Administered 2019-08-04 – 2019-08-05 (×7): 10 mg via ORAL
  Filled 2019-07-26 (×40): qty 2

## 2019-07-26 MED ORDER — ONDANSETRON HCL 4 MG/2ML IJ SOLN
4.0000 mg | Freq: Four times a day (QID) | INTRAMUSCULAR | Status: DC | PRN
  Administered 2019-07-26: 4 mg via INTRAVENOUS
  Filled 2019-07-26: qty 2

## 2019-07-26 MED ORDER — DIPHENHYDRAMINE HCL 50 MG/ML IJ SOLN: 12.5000 mg | Freq: Four times a day (QID) | INTRAMUSCULAR | Status: DC | PRN

## 2019-07-26 MED ORDER — DIPHENHYDRAMINE HCL 12.5 MG/5ML PO ELIX: 12.5000 mg | ORAL_SOLUTION | Freq: Four times a day (QID) | ORAL | Status: DC | PRN

## 2019-07-26 MED ORDER — SODIUM CHLORIDE 0.9% FLUSH: 9.0000 mL | INTRAVENOUS | Status: DC | PRN

## 2019-07-26 MED ORDER — DOCUSATE SODIUM 100 MG PO CAPS
100.0000 mg | ORAL_CAPSULE | Freq: Every day | ORAL | Status: DC
  Administered 2019-07-26 – 2019-07-31 (×3): 100 mg via ORAL
  Filled 2019-07-26 (×10): qty 1

## 2019-07-26 MED ORDER — NALOXONE HCL 0.4 MG/ML IJ SOLN: 0.4000 mg | INTRAMUSCULAR | Status: DC | PRN

## 2019-07-26 MED ORDER — HYDROMORPHONE 1 MG/ML IV SOLN
INTRAVENOUS | Status: DC
  Administered 2019-07-26 (×2): 3.6 mg via INTRAVENOUS
  Administered 2019-07-26: 4.8 mg via INTRAVENOUS
  Administered 2019-07-26: 4.5 mg via INTRAVENOUS
  Administered 2019-07-27: 2.2 mg via INTRAVENOUS
  Administered 2019-07-27: 1.6 mg via INTRAVENOUS
  Administered 2019-07-27: 2.1 mg via INTRAVENOUS

## 2019-07-26 NOTE — Progress Notes (Signed)
Subjective: Patient reports incisional pain and no problems voiding no flatus or BM.  Not eating well. Clear liquid. Diarrhea resolved Reports feels pain better while IV antibiotics infusing but then back to intense  Objective: I have reviewed patient's vital signs.  vital signs, intake and output and labs. Vitals:   07/26/19 1511 07/26/19 1612  BP: 135/90   Pulse: 91   Resp: 17 20  Temp: 98.5 F (36.9 C)   SpO2: 100% 100%   I/O last 3 completed shifts: In: 6726 [P.O.:1080; I.V.:4794; IV Piggyback:852.1] Out: 1568 [Urine:1500; Drains:68] Total I/O In: 360 [P.O.:360] Out: -   Lab Results  Component Value Date   WBC 13.3 (H) 07/25/2019   HGB 8.3 (L) 07/25/2019   HCT 26.3 (L) 07/25/2019   MCV 86.5 07/25/2019   PLT 338 07/25/2019   Lab Results  Component Value Date   CREATININE 0.89 07/25/2019    EXAM General: alert, cooperative and no distress Resp: clear to auscultation bilaterally Cardio: regular rate and rhythm, S1, S2 normal, no murmur, click, rub or gallop GI: soft diffusely tender some BS nondistended. JP site dry Extremities: no edema, redness or tenderness in the calves or thighs Vaginal Bleeding: none  Assessment: s/p Procedure(s): LAPAROSCOPY DIAGNOSTIC Laparoscopic Lysis Of Adhesions and Assist With Drainage of Pelvic Abscess Laparoscopic Lysis Of Adhesions, Drainage of Pelvic Abscess: stable and not tolerating diet On A/G/C Hypokalemia IDA Plan: Cont with triple antibiotics. Will disc with IR regarding re- image and drain if needed as before Await better bowel function Agree will need to wean off PCA but need better GI functioning Cont inpt  LOS: 6 days    Marvene Staff, MD 07/26/2019 5:21 PM    07/26/2019, 5:21 PM

## 2019-07-26 NOTE — Progress Notes (Signed)
[facility]/Trauma Progress Note  2 Days Post-Op   Assessment/Plan Gwendolyn Dockeryis a 33 y.o.femalewith a medical history significant ofPID with complicated B TOAs, s/p IV ABx and perc drainage back in July of this year, also had SBO during that admit. Cultures with no growth from that time. Does have h/o gonorrhea in past couple of years ago.  Recurrent TOA - S/P dx laparoscopy, Laparoscopic Lysis Of Adhesions30min, Drainage of Pelvic Abscess, Dr. Grandville Silos and Dr. Garwin Brothers. 10/04 - drain in place and is serosanguinous - pt has not had flatus, + eating, no nausea or vomiting, she is at a high risk for ileus - am labs - start to wean PCA and added PO pain med  FEN: reg diet VTE: SCD's, lovenox ID: Ampicillin & Clindamycin 10/03>>    Gentamicin 10/04>> WBC 13.3, afebrile, not tachy, antibiotics per GYN Foley: none Follow up: TBD   LOS: 6 days    Subjective: CC: abdominal pain  Pain well controlled with PCA. Pt ate half a sandwich yesterday and is drinking fluids without N or V. No Flatus or BM. No issues overnight.   Objective: Vital signs in last 24 hours: Temp:  [97.6 F (36.4 C)-98.5 F (36.9 C)] 97.7 F (36.5 C) (10/06 0548) Pulse Rate:  [93-106] 93 (10/06 0548) Resp:  [13-20] 15 (10/06 0548) BP: (119-133)/(75-96) 126/75 (10/06 0548) SpO2:  [97 %-100 %] 100 % (10/06 0548) Last BM Date: 07/24/19  Intake/Output from previous day: 10/05 0701 - 10/06 0700 In: 4105 [P.O.:840; I.V.:2711.9; IV Piggyback:553.1] Out: 28 [Drains:28] Intake/Output this shift: No intake/output data recorded.  PE: Gen:  Alert, NAD, pleasant, cooperative Pulm:  Rate and effort normal Abd: Soft, ND, +BS, incisions C/D/I, drain with minimal serosanguinous drainage, generalized TTP with mild guarding. No peritonitis  Skin: no rashes noted, warm and dry   Anti-infectives: Anti-infectives (From admission, onward)   Start     Dose/Rate Route Frequency Ordered Stop   07/24/19 1730  gentamicin (GARAMYCIN) 520 mg in dextrose 5 % 100 mL IVPB     7 mg/kg  74.6 kg 113 mL/hr over 60 Minutes Intravenous Every 24 hours 07/24/19 0515     07/24/19 1010  polymyxin B 500,000 Units, bacitracin 50,000 Units in sodium chloride 0.9 % 500 mL irrigation  Status:  Discontinued       As needed 07/24/19 1010 07/24/19 1116   07/23/19 1730  clindamycin (CLEOCIN) IVPB 900 mg     900 mg 100 mL/hr over 30 Minutes Intravenous Every 8 hours 07/23/19 1632     07/23/19 1730  gentamicin (GARAMYCIN) 370 mg in dextrose 5 % 100 mL IVPB  Status:  Discontinued     5 mg/kg  74.6 kg 109.3 mL/hr over 60 Minutes Intravenous Every 24 hours 07/23/19 1632 07/24/19 0515   07/23/19 1730  ampicillin (OMNIPEN) 2 g in sodium chloride 0.9 % 100 mL IVPB     2 g 300 mL/hr over 20 Minutes Intravenous Every 6 hours 07/23/19 1632     07/22/19 1400  metroNIDAZOLE (FLAGYL) tablet 500 mg  Status:  Discontinued     500 mg Oral Every 12 hours 07/22/19 1244 07/23/19 1632   07/22/19 1030  doxycycline (VIBRA-TABS) tablet 100 mg  Status:  Discontinued     100 mg Oral Every 12 hours 07/22/19 0952 07/22/19 1244   07/21/19 1100  metroNIDAZOLE (FLAGYL) tablet 500 mg  Status:  Discontinued     500 mg Oral Every 12 hours 07/21/19 1008 07/22/19 0952   07/20/19 2200  doxycycline (VIBRAMYCIN) 100 mg in sodium chloride 0.9 % 250 mL IVPB  Status:  Discontinued     100 mg 125 mL/hr over 120 Minutes Intravenous Every 12 hours 07/20/19 2103 07/21/19 1008   07/20/19 2100  piperacillin-tazobactam (ZOSYN) IVPB 3.375 g  Status:  Discontinued     3.375 g 12.5 mL/hr over 240 Minutes Intravenous Every 8 hours 07/20/19 2044 07/23/19 1632   07/20/19 2045  metroNIDAZOLE (FLAGYL) IVPB 500 mg     500 mg 100 mL/hr over 60 Minutes Intravenous  Once 07/20/19 2040 07/20/19 2330      Lab Results:  Recent Labs    07/23/19 1142 07/24/19 0811 07/25/19 0316  WBC 15.6*  --  13.3*  HGB 8.6* 10.2* 8.3*  HCT 26.3* 30.0* 26.3*  PLT 294   --  338   BMET Recent Labs    07/24/19 0811 07/25/19 1436  NA 141 139  K 4.2 3.3*  CL  --  106  CO2  --  26  GLUCOSE  --  113*  BUN  --  <5*  CREATININE  --  0.89  CALCIUM  --  8.7*   PT/INR No results for input(s): LABPROT, INR in the last 72 hours. CMP     Component Value Date/Time   NA 139 07/25/2019 1436   K 3.3 (L) 07/25/2019 1436   CL 106 07/25/2019 1436   CO2 26 07/25/2019 1436   GLUCOSE 113 (H) 07/25/2019 1436   BUN <5 (L) 07/25/2019 1436   CREATININE 0.89 07/25/2019 1436   CALCIUM 8.7 (L) 07/25/2019 1436   PROT 7.4 07/20/2019 1634   ALBUMIN 3.8 07/20/2019 1634   AST 32 07/20/2019 1634   ALT 40 07/20/2019 1634   ALKPHOS 50 07/20/2019 1634   BILITOT 0.4 07/20/2019 1634   GFRNONAA >60 07/25/2019 1436   GFRAA >60 07/25/2019 1436   Lipase     Component Value Date/Time   LIPASE 27 07/20/2019 1634    Studies/Results: No results found.   Kalman Drape, [facility] Pager [phone] Cristine Polio, & Friday 7:00am - 4:30pm Thursdays 7:00am -11:30am  Consults: [phone]

## 2019-07-27 ENCOUNTER — Ambulatory Visit
Payer: BC Managed Care – PPO | Attending: Student in an Organized Health Care Education/Training Program | Admitting: Physical Therapy

## 2019-07-27 ENCOUNTER — Telehealth: Payer: Self-pay | Admitting: Physical Therapy

## 2019-07-27 DIAGNOSIS — R262 Difficulty in walking, not elsewhere classified: Secondary | ICD-10-CM | POA: Insufficient documentation

## 2019-07-27 DIAGNOSIS — M6281 Muscle weakness (generalized): Secondary | ICD-10-CM | POA: Insufficient documentation

## 2019-07-27 LAB — BASIC METABOLIC PANEL
Anion gap: 9 (ref 5–15)
BUN: <5 mg/dL — ABNORMAL LOW (ref 6–20)
CO2: 25 mmol/L (ref 22–32)
Calcium: 9.1 mg/dL (ref 8.9–10.3)
Chloride: 101 mmol/L (ref 98–111)
Creatinine, Ser: 0.91 mg/dL (ref 0.44–1.00)
GFR calc Af Amer: >60 mL/min (ref >60)
GFR calc non Af Amer: >60 mL/min (ref >60)
Glucose, Bld: 102 mg/dL — ABNORMAL HIGH (ref 70–99)
Potassium: 5.5 mmol/L — ABNORMAL HIGH (ref 3.5–5.1)
Sodium: 135 mmol/L (ref 135–145)

## 2019-07-27 LAB — CBC WITH DIFFERENTIAL/PLATELET
Abs Immature Granulocytes: 0.07 10*3/uL (ref 0.00–0.07)
Basophils Absolute: 0 10*3/uL (ref 0.0–0.1)
Basophils Relative: 0 %
Eosinophils Absolute: 0.2 10*3/uL (ref 0.0–0.5)
Eosinophils Relative: 2 %
HCT: 32.2 % — ABNORMAL LOW (ref 36.0–46.0)
Hemoglobin: 10.3 g/dL — ABNORMAL LOW (ref 12.0–15.0)
Immature Granulocytes: 1 %
Lymphocytes Relative: 14 %
Lymphs Abs: 1.6 10*3/uL (ref 0.7–4.0)
MCH: 27.7 pg (ref 26.0–34.0)
MCHC: 32 g/dL (ref 30.0–36.0)
MCV: 86.6 fL (ref 80.0–100.0)
Monocytes Absolute: 0.7 10*3/uL (ref 0.1–1.0)
Monocytes Relative: 6 %
Neutro Abs: 9.1 10*3/uL — ABNORMAL HIGH (ref 1.7–7.7)
Neutrophils Relative %: 77 %
Platelets: 362 10*3/uL (ref 150–400)
RBC: 3.72 MIL/uL — ABNORMAL LOW (ref 3.87–5.11)
RDW: 15.9 % — ABNORMAL HIGH (ref 11.5–15.5)
WBC: 11.7 10*3/uL — ABNORMAL HIGH (ref 4.0–10.5)
nRBC: 0.3 % — ABNORMAL HIGH (ref 0.0–0.2)

## 2019-07-27 MED ORDER — KETOROLAC TROMETHAMINE 30 MG/ML IJ SOLN
30.0000 mg | Freq: Four times a day (QID) | INTRAMUSCULAR | Status: AC | PRN
  Administered 2019-07-28 – 2019-08-01 (×7): 30 mg via INTRAVENOUS
  Filled 2019-07-27 (×8): qty 1

## 2019-07-27 MED ORDER — SIMETHICONE 80 MG PO CHEW
80.0000 mg | CHEWABLE_TABLET | Freq: Four times a day (QID) | ORAL | Status: DC
  Administered 2019-07-27 – 2019-08-05 (×33): 80 mg via ORAL
  Filled 2019-07-27 (×35): qty 1

## 2019-07-27 MED ORDER — HYDROMORPHONE HCL 2 MG PO TABS
4.0000 mg | ORAL_TABLET | ORAL | Status: DC | PRN
  Administered 2019-07-27 – 2019-08-03 (×21): 4 mg via ORAL
  Filled 2019-07-27 (×22): qty 2

## 2019-07-27 MED ORDER — ONDANSETRON HCL 4 MG/2ML IJ SOLN
4.0000 mg | Freq: Four times a day (QID) | INTRAMUSCULAR | Status: DC | PRN
  Administered 2019-07-27: 4 mg via INTRAVENOUS
  Filled 2019-07-27: qty 2

## 2019-07-27 NOTE — Final Consult Note (Signed)
Consultant Final Sign-Off Note    Assessment/Final recommendations  Gwendolyn Nguyen is a 33 y.o. female followed by me for   Recurrent TOA - S/P dx laparoscopy,Laparoscopic Lysis Of Adhesions62min, Drainage of Pelvic Abscess, Dr. Grandville Silos and Dr. Garwin Brothers. 10/04 - drain in place and is serosanguinous - + Flatus, tolerating diet   Wound care (if applicable): Per primary   Diet at discharge: per primary team   Activity at discharge: per primary team   Follow-up appointment: None needed, per primary   Pending results:  Unresulted Labs (From admission, onward)    Start     Ordered   07/27/19 0834  CBC with Differential  Once,   R    Question:  Specimen collection method  Answer:  IV Team=IV Team collect   07/27/19 0834           Medication recommendations:   Other recommendations:    Thank you for allowing [place] to participate in the care of your patient!  Please consult [place] again if you have further needs for your patient.  Kalman Drape 07/27/2019 9:37 AM    Subjective   CC: Abdominal pain  Patient states some nausea overnight but no vomiting.  She had flatus.  She is tolerating some food.  No fever or chills.  Objective  Vital signs in last 24 hours: Temp:  [98.3 F (36.8 C)-98.5 F (36.9 C)] 98.4 F (36.9 C) (10/07 0415) Pulse Rate:  [85-93] 85 (10/07 0415) Resp:  [13-20] 14 (10/07 0732) BP: (124-135)/(77-90) 124/81 (10/07 0415) SpO2:  [96 %-100 %] 96 % (10/07 0732)  PE: Gen: Alert, NAD, pleasant, cooperative Pulm:Rate andeffort normal Abd: Soft, ND, +BS, incisions C/D/I, drain with minimalserosanguinous drainage, lower abdominal TTP without guarding. No peritonitis Skin: no rashes noted, warm and dry   Pertinent labs and Studies: Recent Labs    07/25/19 0316  WBC 13.3*  HGB 8.3*  HCT 26.3*   BMET Recent Labs    07/25/19 1436 07/27/19 0851  NA 139 135  K 3.3* 5.5*  CL 106 101  CO2 26 25  GLUCOSE 113* 102*  BUN <5* <5*   CREATININE 0.89 0.91  CALCIUM 8.7* 9.1   No results for input(s): LABURIN in the last 72 hours. Results for orders placed or performed during the hospital encounter of 07/20/19  Blood culture (routine x 2)     Status: None   Collection Time: 07/20/19  8:45 PM   Specimen: BLOOD RIGHT HAND  Result Value Ref Range Status   Specimen Description BLOOD RIGHT HAND  Final   Special Requests   Final    BOTTLES DRAWN AEROBIC AND ANAEROBIC Blood Culture results may not be optimal due to an inadequate volume of blood received in culture bottles   Culture   Final    NO GROWTH 5 DAYS Performed at [facility], [place] [address], [place], [place] [address]    Report Status 07/25/2019 FINAL  Final  SARS CORONAVIRUS 2 (TAT 6-24 HRS) Nasopharyngeal Nasopharyngeal Swab     Status: None   Collection Time: 07/20/19  9:03 PM   Specimen: Nasopharyngeal Swab  Result Value Ref Range Status   SARS Coronavirus 2 NEGATIVE NEGATIVE Final    Comment: (NOTE) SARS-CoV-2 target nucleic acids are NOT DETECTED. The SARS-CoV-2 RNA is generally detectable in upper and lower respiratory specimens during the acute phase of infection. Negative results do not preclude SARS-CoV-2 infection, do not rule out co-infections with other pathogens, and should not be used  as the sole basis for treatment or other patient management decisions. Negative results must be combined with clinical observations, patient history, and epidemiological information. The expected result is Negative. Fact Sheet for Patients: SugarRoll.be Fact Sheet for Healthcare Providers: https://www.woods-mathews.com/ This test is not yet approved or cleared by the [place] FDA and  has been authorized for detection and/or diagnosis of SARS-CoV-2 by FDA under an Emergency Use Authorization (EUA). This EUA will remain  in effect (meaning this test can be used) for the duration of the COVID-19 declaration  under Section 56 4(b)(1) of the Act, 21 U.S.C. section 360bbb-3(b)(1), unless the authorization is terminated or revoked sooner. Performed at [facility], [place] [address] [place], [place] [address]   Blood culture (routine x 2)     Status: None   Collection Time: 07/20/19  9:09 PM   Specimen: BLOOD  Result Value Ref Range Status   Specimen Description BLOOD RIGHT ANTECUBITAL  Final   Special Requests   Final    BOTTLES DRAWN AEROBIC ONLY Blood Culture adequate volume   Culture   Final    NO GROWTH 5 DAYS Performed at [facility], [place] [address] [place], [place] [address]    Report Status 07/25/2019 FINAL  Final  MRSA PCR Screening     Status: None   Collection Time: 07/20/19  9:23 PM   Specimen: Nasal Mucosa; Nasopharyngeal  Result Value Ref Range Status   MRSA by PCR NEGATIVE NEGATIVE Final    Comment:        The GeneXpert MRSA Assay (FDA approved for NASAL specimens only), is one component of a comprehensive MRSA colonization surveillance program. It is not intended to diagnose MRSA infection nor to guide or monitor treatment for MRSA infections. Performed at [facility], [place] [address] [place], [place] [address]     Imaging: No results found.

## 2019-07-27 NOTE — Progress Notes (Signed)
Pharmacy Antibiotic Note  Gwendolyn Nguyen is a 33 y.o. 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  Pharmacy has been consulted for gentamicin dosing (she is also on clindamycin and ampicillin) Scr stable at 0.91, afebrile, WBC pending  Plan: -Continue gentamicin 520 mg IV q24h F/u renal function, clinical course    Temp (24hrs), Avg:98.6 F (37 C), Min:98.3 F (36.8 C), Max:99 F (37.2 C)  Recent Labs  Lab 07/20/19 1634 07/20/19 2045 07/21/19 0107 07/23/19 0302 07/23/19 1142 07/24/19 0342 07/25/19 0316 07/25/19 1436 07/27/19 0851  WBC 16.9*  --  16.0*  --  15.6*  --  13.3*  --   --   CREATININE 1.00  --  1.07* 0.78  --   --   --  0.89 0.91  LATICACIDVEN  --  1.2  --   --   --   --   --   --   --   GENTRANDOM  --   --   --   --   --  2.3  --   --   --     Estimated Creatinine Clearance: 88.7 mL/min (by C-G formula based on SCr of 0.91 mg/dL).    Allergies  Allergen Reactions  . Gadolinium Derivatives Hives, Itching and Nausea And Vomiting    Pt vomited immediately during injection.  15 minutes later, pt developed hives and itching.   . Shellfish Allergy Hives  . Sulfa Antibiotics Other (See Comments)    Patient was told to NOT TAKE THIS  . Iohexol Hives and Rash    08-2018, rash and hives, needs pre meds S/W PATIENT STATED REACTION FROM CT CONTRAST 11/19    Antimicrobials this admission: 10/3 gent >> 10/3 clinda >> 10/3 ampicillin >> 10/1 flagyl>>1-/3 9/30 doxy>>10/2 9/30 zosyn>>10/13   Dose adjustments this admission: 10/4  Increase gent 5mg /kg>>7mg /kg  Microbiology results: 9/30 blood x2- ngtd  Emera Bussie A. Levada Dy, PharmD, BCPS, FNKF Clinical Pharmacist  Please utilize Amion for appropriate phone number to reach the unit pharmacist ([place])

## 2019-07-27 NOTE — Plan of Care (Signed)
  Problem: Education: Goal: Knowledge of General Education information will improve Description Including pain rating scale, medication(s)/side effects and non-pharmacologic comfort measures Outcome: Progressing   

## 2019-07-27 NOTE — Progress Notes (Signed)
Pt vomited x1, unable to measure. Paged MD

## 2019-07-27 NOTE — Telephone Encounter (Signed)
Left VM regarding hospital admission and scheduled PT appointment today.  Lliam Hoh C. Carlia Bomkamp PT, DPT 07/27/19 9:29 AM

## 2019-07-27 NOTE — Progress Notes (Signed)
Subjective: Patient reports tolerating PO, + flatus and no problems voiding.   On clear liquid diet Objective: I have reviewed patient's vital signs.  vital signs and medications. Vitals:   07/27/19 0415 07/27/19 0732  BP: 124/81   Pulse: 85   Resp:  14  Temp: 98.4 F (36.9 C)   SpO2:  96%   I/O last 3 completed shifts: In: 5111 [P.O.:1200; I.V.:3111; IV Piggyback:800] Out: 553 [Urine:525; Drains:28] No intake/output data recorded.  Lab Results  Component Value Date   WBC 13.3 (H) 07/25/2019   HGB 8.3 (L) 07/25/2019   HCT 26.3 (L) 07/25/2019   MCV 86.5 07/25/2019   PLT 338 07/25/2019   Lab Results  Component Value Date   CREATININE 0.89 07/25/2019    EXAM General: alert, cooperative and no distress Resp: clear to auscultation bilaterally Cardio: regular rate and rhythm, S1, S2 normal, no murmur, click, rub or gallop GI: soft active BS throughout JP site draining. tender w/o rebound Extremities: no edema, redness or tenderness in the calves or thighs Vaginal Bleeding: none  Assessment: s/p Procedure(s): LAPAROSCOPY DIAGNOSTIC Laparoscopic Lysis Of Adhesions and Assist With Drainage of Pelvic Abscess Laparoscopic Lysis Of Adhesions, Drainage of Pelvic Abscess: stable and anemia  Plan: Advance diet Encourage ambulation Advance to PO medication d/c PCA   Cont A/C/G Cbc, bmet today  LOS: 7 days    Marvene Staff, MD 07/27/2019 8:36 AM    07/27/2019, 8:36 AM

## 2019-07-28 ENCOUNTER — Inpatient Hospital Stay: Payer: Self-pay

## 2019-07-28 MED ORDER — CHLORHEXIDINE GLUCONATE CLOTH 2 % EX PADS
6.0000 | MEDICATED_PAD | Freq: Every day | CUTANEOUS | Status: DC
  Administered 2019-07-28 – 2019-08-04 (×8): 6 via TOPICAL

## 2019-07-28 MED ORDER — SODIUM CHLORIDE 0.9% FLUSH
10.0000 mL | Freq: Two times a day (BID) | INTRAVENOUS | Status: DC
  Administered 2019-07-29 – 2019-08-02 (×4): 10 mL

## 2019-07-28 MED ORDER — SODIUM CHLORIDE 0.9% FLUSH
10.0000 mL | INTRAVENOUS | Status: DC | PRN
  Administered 2019-08-05: 10 mL
  Filled 2019-07-28: qty 40

## 2019-07-28 NOTE — Progress Notes (Signed)
Peripherally Inserted Central Catheter/Midline Placement  The IV Nurse has discussed with the patient and/or persons authorized to consent for the patient, the purpose of this procedure and the potential benefits and risks involved with this procedure.  The benefits include less needle sticks, lab draws from the catheter, and the patient may be discharged home with the catheter. Risks include, but not limited to, infection, bleeding, blood clot (thrombus formation), and puncture of an artery; nerve damage and irregular heartbeat and possibility to perform a PICC exchange if needed/ordered by physician.  Alternatives to this procedure were also discussed.  Bard Power PICC patient education guide, fact sheet on infection prevention and patient information card has been provided to patient /or left at bedside.    PICC/Midline Placement Documentation  PICC Double Lumen 07/28/19 PICC Right Brachial 38 cm 3 cm (Active)  Indication for Insertion or Continuance of Line Poor Vasculature-patient has had multiple peripheral attempts or PIVs lasting less than 24 hours 07/28/19 1331  Exposed Catheter (cm) 3 cm 07/28/19 1331  Site Assessment Clean;Dry;Intact 07/28/19 1331  Lumen #1 Status Flushed;Blood return noted;Saline locked 07/28/19 1331  Lumen #2 Status Flushed;Blood return noted;Saline locked 07/28/19 1331  Dressing Type Transparent;Securing device 07/28/19 1331  Dressing Status Clean;Dry;Intact;Antimicrobial disc in place 07/28/19 1331  Dressing Change Due 08/04/19 07/28/19 1331       Frances Maywood 07/28/2019, 1:33 PM

## 2019-07-28 NOTE — Progress Notes (Signed)
Subjective: Patient reports flatus, tol reg diet, some upper and lower abdominal pain Objective: I have reviewed patient's vital signs.   vital signs and medications.BP (!) 140/101 (BP Location: Left Arm)   Pulse (!) 104   Temp 99.5 F (37.5 C) (Oral)   Resp 18   LMP 07/06/2019 (Approximate)   SpO2 98%   Lab Results  Component Value Date   WBC 11.7 (H) 07/27/2019   HGB 10.3 (L) 07/27/2019   HCT 32.2 (L) 07/27/2019   MCV 86.6 07/27/2019   PLT 362 07/27/2019   Lab Results  Component Value Date   CREATININE 0.91 07/27/2019    EXAM General: alert, cooperative and no distress Resp: clear to auscultation bilaterally Cardio: regular rate and rhythm, S1, S2 normal, no murmur, click, rub or gallop GI: soft active BS throughout JP site draining. tender w/o rebound Extremities: no edema, redness or tenderness in the calves or thighs Vaginal Bleeding: none  Assessment: s/p Procedure(s): LAPAROSCOPY DIAGNOSTIC Laparoscopic Lysis Of Adhesions and Assist With Drainage of Pelvic Abscess Laparoscopic Lysis Of Adhesions, Drainage of Pelvic Abscess: TOA on A/G/C P) CT scan of pelvis ordered. Nurse made aware. Cont triple antibiotics. JP remaining until decrease output. Adjust pain med use Marvene Staff, MD 07/28/2019 5:47 PM    07/28/2019, 5:47 PM

## 2019-07-29 ENCOUNTER — Inpatient Hospital Stay (HOSPITAL_COMMUNITY): Payer: BC Managed Care – PPO

## 2019-07-29 LAB — BASIC METABOLIC PANEL
Anion gap: 11 (ref 5–15)
BUN: <5 mg/dL — ABNORMAL LOW (ref 6–20)
CO2: 22 mmol/L (ref 22–32)
Calcium: 9.7 mg/dL (ref 8.9–10.3)
Chloride: 102 mmol/L (ref 98–111)
Creatinine, Ser: 1.05 mg/dL — ABNORMAL HIGH (ref 0.44–1.00)
GFR calc Af Amer: >60 mL/min (ref >60)
GFR calc non Af Amer: >60 mL/min (ref >60)
Glucose, Bld: 142 mg/dL — ABNORMAL HIGH (ref 70–99)
Potassium: 4.5 mmol/L (ref 3.5–5.1)
Sodium: 135 mmol/L (ref 135–145)

## 2019-07-29 MED ORDER — PREDNISONE 50 MG PO TABS
50.0000 mg | ORAL_TABLET | Freq: Four times a day (QID) | ORAL | Status: AC
  Administered 2019-07-29 (×3): 50 mg via ORAL
  Filled 2019-07-29 (×3): qty 1

## 2019-07-29 MED ORDER — DIPHENHYDRAMINE HCL 50 MG/ML IJ SOLN: 50.0000 mg | Freq: Once | INTRAMUSCULAR | Status: DC

## 2019-07-29 MED ORDER — IOHEXOL 300 MG/ML  SOLN
100.0000 mL | Freq: Once | INTRAMUSCULAR | Status: AC | PRN
  Administered 2019-07-29: 100 mL via INTRAVENOUS

## 2019-07-29 MED ORDER — DIPHENHYDRAMINE HCL 25 MG PO CAPS
50.0000 mg | ORAL_CAPSULE | Freq: Once | ORAL | Status: AC
  Administered 2019-07-29: 50 mg via ORAL
  Filled 2019-07-29: qty 2

## 2019-07-29 NOTE — Progress Notes (Signed)
CT called to see if pt can have CT of pelvis as soon as possible but due to the fact that pt is sensitive to the contrast, pt has to have premeds and wait 13hrs before being scanned. Asked CT tech to call med and explain the  Timing of the scan for this evening

## 2019-07-29 NOTE — Progress Notes (Signed)
HD #10  A/G/C  S: feels better today. Await CT scan  O: BP 127/84 (BP Location: Left Arm)   Pulse 83   Temp 98.6 F (37 C) (Oral)   Resp 20   LMP 07/06/2019 (Approximate)   SpO2 100%  Lungs clear to A Cor RRR Abd soft nondistended. Active BS JP site d/c. Bulb with light bloody fluid  CMP Latest Ref Rng & Units 07/29/2019 07/27/2019 07/25/2019  Glucose 70 - 99 mg/dL 142(H) 102(H) 113(H)  BUN 6 - 20 mg/dL <5(L) <5(L) <5(L)  Creatinine 0.44 - 1.00 mg/dL 1.05(H) 0.91 0.89  Sodium 135 - 145 mmol/L 135 135 139  Potassium 3.5 - 5.1 mmol/L 4.5 5.5(H) 3.3(L)  Chloride 98 - 111 mmol/L 102 101 106  CO2 22 - 32 mmol/L 22 25 26   Calcium 8.9 - 10.3 mg/dL 9.7 9.1 8.7(L)  Total Protein 6.5 - 8.1 g/dL - - -  Total Bilirubin 0.3 - 1.2 mg/dL - - -  Alkaline Phos 38 - 126 U/L - - -  AST 15 - 41 U/L - - -  ALT 0 - 44 U/L - - -   CBC Latest Ref Rng & Units 07/27/2019 07/25/2019 07/24/2019  WBC 4.0 - 10.5 K/uL 11.7(H) 13.3(H) -  Hemoglobin 12.0 - 15.0 g/dL 10.3(L) 8.3(L) 10.2(L)  Hematocrit 36.0 - 46.0 % 32.2(L) 26.3(L) 30.0(L)  Platelets 150 - 400 K/uL 362 338 -  IMP: TOA on triple antibiotics P) CT scan of pelvis to assess if needs IR drainage Cont with IV antibiotics

## 2019-07-29 NOTE — Progress Notes (Addendum)
Paged DR. Cousins to verify CT scan order with contrast.

## 2019-07-29 NOTE — Plan of Care (Signed)
  Problem: Health Behavior/Discharge Planning: Goal: Ability to manage health-related needs will improve Outcome: Progressing   

## 2019-07-30 LAB — GENTAMICIN LEVEL, RANDOM: Gentamicin Rm: 5.5 ug/mL

## 2019-07-30 MED ORDER — METHYLPREDNISOLONE 4 MG PO TBPK
8.0000 mg | ORAL_TABLET | Freq: Every evening | ORAL | Status: AC
  Administered 2019-07-31: 8 mg via ORAL

## 2019-07-30 MED ORDER — METHYLPREDNISOLONE 4 MG PO TBPK
4.0000 mg | ORAL_TABLET | ORAL | Status: AC
  Administered 2019-07-30: 4 mg via ORAL

## 2019-07-30 MED ORDER — GENTAMICIN SULFATE 40 MG/ML IJ SOLN
7.0000 mg/kg | INTRAVENOUS | Status: DC
  Administered 2019-07-31 – 2019-08-01 (×2): 520 mg via INTRAVENOUS
  Filled 2019-07-30 (×3): qty 13

## 2019-07-30 MED ORDER — METHYLPREDNISOLONE 4 MG PO TBPK
4.0000 mg | ORAL_TABLET | Freq: Three times a day (TID) | ORAL | Status: AC
  Administered 2019-07-31 (×3): 4 mg via ORAL

## 2019-07-30 MED ORDER — METHYLPREDNISOLONE 4 MG PO TBPK
8.0000 mg | ORAL_TABLET | Freq: Every morning | ORAL | Status: AC
  Administered 2019-07-30: 8 mg via ORAL
  Filled 2019-07-30: qty 21

## 2019-07-30 MED ORDER — METHYLPREDNISOLONE 4 MG PO TBPK
4.0000 mg | ORAL_TABLET | Freq: Four times a day (QID) | ORAL | Status: DC
  Administered 2019-08-01 – 2019-08-02 (×7): 4 mg via ORAL

## 2019-07-30 MED ORDER — DIPHENHYDRAMINE HCL 25 MG PO CAPS
50.0000 mg | ORAL_CAPSULE | Freq: Four times a day (QID) | ORAL | Status: DC | PRN
  Administered 2019-07-30 – 2019-08-03 (×4): 50 mg via ORAL
  Filled 2019-07-30 (×4): qty 2

## 2019-07-30 MED ORDER — METHYLPREDNISOLONE 4 MG PO TBPK
8.0000 mg | ORAL_TABLET | Freq: Every evening | ORAL | Status: AC
  Administered 2019-07-30: 8 mg via ORAL

## 2019-07-30 NOTE — Plan of Care (Signed)
  Problem: Activity: Goal: Risk for activity intolerance will decrease Outcome: Progressing   Problem: Nutrition: Goal: Adequate nutrition will be maintained Outcome: Progressing   Problem: Elimination: Goal: Will not experience complications related to bowel motility Outcome: Progressing   

## 2019-07-30 NOTE — Progress Notes (Signed)
HD #11 A/C/G TOA S: notes itching since CT scan Feels well today. Notes some itching. No SOB. Denies abdominal pain. tol reg diet (+) BM (+) BM  O: BP (!) 137/96   Pulse 82   Temp 98.8 F (37.1 C) (Oral)   Resp 18   Ht 5\' 4"  (1.626 m)   Wt 74 kg   LMP 07/06/2019 (Approximate)   SpO2 99%   BMI 28.00 kg/m   Lungs clear to A Cor RRR Abd soft active BS nontender JP Skin: fine rash noted on abdomen and arm Pad none extr no edema  Ct Pelvis W Contrast  Result Date: 07/30/2019 CLINICAL DATA:  Tubo-ovarian abscess, pelvic inflammatory disease. EXAM: CT PELVIS WITH CONTRAST TECHNIQUE: Multidetector CT imaging of the pelvis was performed using the standard protocol following the bolus administration of intravenous contrast. CONTRAST:  167mL OMNIPAQUE IOHEXOL 300 MG/ML  SOLN COMPARISON:  CT head July 20, 2019, September 4,  20 FINDINGS: Urinary Tract:  Distal ureters and bladder normal. Bowel:  Fluid-filled loops of colon.  No evidence of perforation. Vascular/Lymphatic: No pelvic lymphadenopathy. Reproductive:  Uterus grossly normal. Again demonstrated enhancing fluid collections in the LEFT and RIGHT adnexa with enhancing walls and interspersed gas. The fluid collection on the LEFT measures 5.4 x 3.5 cm compared with 4.5 x 3.1 cm for no significant change. The collection on the RIGHT measures 4.6 by 2.9 cm compared with 4.6 x 3.0 cm for no interval change. There is been interval placement of a drainage catheter in the ventral peritoneal space of the LEFT lower quadrant. This drainage catheter does not appear to communicate with the adnexal collections. There is a third collection in the deep LEFT pelvis mesorectal fat measuring 2.4 by 2.1 cm (image 26/3) which is also unchanged. Other: The several foci of subcutaneous gas presumably related to heparin injections in the anterior abdominal wall. Musculoskeletal: No aggressive osseous lesion IMPRESSION: 1. Essentially no change in the fluid  collections in the LEFT and RIGHT adnexa. These fluid collections have enhancing rims and internal gas components consistent with abscesses. Third collection in the deep LEFT pelvis probably communicates with the RIGHT adnexal collection. 2. Percutaneous drain in the ventral LEFT peritoneal space does not appear to communicate with the above-described collections. Electronically Signed   By: Suzy Bouchard M.D.   On: 07/30/2019 06:31   [place] Ekg Site Rite  Result Date: 07/28/2019 If Site Rite image not attached, placement could not be confirmed due to current cardiac rhythm. IMP; recurrence of TOA( pt has had interval IR drainage but failed outpt treatment for recurrence of PID now on IV triple antibiotics S/p dx laparoscopy, LOA clinically better P) cont IV antibiotics. Review case for repeat drainage with IR( not avail w/e). Benadryl/medrol dose pack prn rash( related to IV contrast allergy). Plan reviewed with pt

## 2019-07-30 NOTE — Progress Notes (Signed)
Pharmacy Antibiotic Note  Gwendolyn Nguyen is a 33 y.o. 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  Pharmacy has been consulted for gentamicin dosing (she is also on clindamycin and ampicillin) Scr stable at 0.91, afebrile, WBC pending  10/10 AM update: Gentamicin level per [facility] Nomogram is above therapeutic range Small rise in SCr over the last few day (0.89>>0.91>>1.05)  Plan: -Change gentamicin to 520 mg IV q36h F/u renal function, clinical course, re-check levels as needed  Height: 5\' 4"  (162.6 cm) IBW/kg (Calculated) : 54.7  Temp (24hrs), Avg:98.4 F (36.9 C), Min:98 F (36.7 C), Max:98.7 F (37.1 C)  Recent Labs  Lab 07/23/19 1142 07/24/19 0342 07/25/19 0316 07/25/19 1436 07/27/19 0851 07/29/19 1344 07/30/19 0423  WBC 15.6*  --  13.3*  --  11.7*  --   --   CREATININE  --   --   --  0.89 0.91 1.05*  --   GENTRANDOM  --  2.3  --   --   --   --  5.5    Estimated Creatinine Clearance: 76.8 mL/min (A) (by C-G formula based on SCr of 1.05 mg/dL (H)).    Allergies  Allergen Reactions  . Gadolinium Derivatives Hives, Itching and Nausea And Vomiting    Pt vomited immediately during injection.  15 minutes later, pt developed hives and itching.   . Shellfish Allergy Hives  . Sulfa Antibiotics Other (See Comments)    Patient was told to NOT TAKE THIS  . Iohexol Hives and Rash    08-2018, rash and hives, needs pre meds S/W PATIENT STATED REACTION FROM CT CONTRAST 11/19    Antimicrobials this admission: 10/3 gent >> 10/3 clinda >> 10/3 ampicillin >> 10/1 flagyl>>1-/3 9/30 doxy>>10/2 9/30 zosyn>>10/13   Dose adjustments this admission: 10/4  Increase gent 5mg /kg>>7mg /kg 10/10 Dec gent to 7mg /kg IV q36h  Microbiology results: 9/30 blood x2- ngtd  Narda Bonds, PharmD, BCPS Clinical Pharmacist Phone: [phone]

## 2019-07-31 MED ORDER — ENOXAPARIN SODIUM 40 MG/0.4ML ~~LOC~~ SOLN
40.0000 mg | SUBCUTANEOUS | Status: DC
  Administered 2019-08-02 – 2019-08-04 (×3): 40 mg via SUBCUTANEOUS
  Filled 2019-07-31 (×4): qty 0.4

## 2019-07-31 NOTE — Progress Notes (Signed)
HD #12 A/G/C TOA S: notes itching since CT scan. Sx worsened then improved with steroid/benadryl . Notes some itching but better. No SOB.  tol reg diet (+) BM LLQ pain started back again  O:  Patient Vitals for the past 24 hrs:  BP Temp Temp src Pulse Resp SpO2  07/31/19 0555 (!) 139/98 98.7 F (37.1 C) Oral 79 18 100 %  07/30/19 2042 (!) 140/94 98.5 F (36.9 C) Oral 84 18 100 %  07/30/19 1340 (!) 141/90 98 F (36.7 C) Oral 86 18 100 %   Lungs clear to A Cor RRR Abd soft active BS tender LLQ JP draining sanginuous fluid Skin: fine rash noted on abdomen and arm/leg improved Pad none extr no edema  IMP; recurrence of TOA( pt has had interval IR drainage but failed outpt treatment for recurrence of PID now on IV triple antibiotics S/p dx laparoscopy, LOA  P) cont IV antibiotics. Consult IR for drainage of both areas. NPO tonight in the event this can be done tomorrow. Cbc in am. Plan disc with pt. Cont with medrol dose pack

## 2019-07-31 NOTE — Consult Note (Signed)
Chief Complaint: Patient was seen in consultation today for tubo-ovarian abscess  Referring Physician(s): Dr. Garwin Brothers  Supervising Physician: Arne Cleveland  Patient Status: [facility] - In-pt  History of Present Illness: Gwendolyn Nguyen is a 33 y.o. female with past medical history of back pain, endometriosis, PID who presented to [facility] ED in July of this year with abdominal pain. Patient found to have complex, bilateral adnexal lesions, likely pyosalpinx bilaterally with scattered fluid collection suspicious for abscesses.  IR consulted for aspiration and drainage at the request of Dr. Garwin Brothers.  A drain was placed 05/10/19 by Dr. Kathlene Cote and was removed 05/24/19.  Patient underwent joint surgery with General surgery and OBGYN 07/24/19.  A JP was left in place in the LLQ.  She remains inpatient with ongoing bloody drainage.  CT Pelvis yesterday showed: 1. Essentially no change in the fluid collections in the LEFT and RIGHT adnexa. These fluid collections have enhancing rims and internal gas components consistent with abscesses. Third collection in the deep LEFT pelvis probably communicates with the RIGHT adnexal collection. 2. Percutaneous drain in the ventral LEFT peritoneal space does not appear to communicate with the above-described collections.  IR consulted for aspiration and drainage of intra-abdominal fluid collection.  Patient assessed this afternoon.  She has a heating pad which provides some relief but reports ongoing pain and discomfort.  She is frustrated she will require another procedure/drain, but is agreeable.    Past Medical History:  Diagnosis Date   Anemia    Back pain    Constipation    Endometriosis    IBS (irritable bowel syndrome)    Low hemoglobin    Multiple food allergies    PID (acute pelvic inflammatory disease) [id number]   complicated by tuboovarian abscess    Past Surgical History:  Procedure Laterality Date   IR RADIOLOGIST EVAL & MGMT   05/19/2019   IR RADIOLOGIST EVAL & MGMT  05/24/2019   LAPAROSCOPIC LYSIS OF ADHESIONS N/A 07/24/2019   Procedure: Laparoscopic Lysis Of Adhesions, Drainage of Pelvic Abscess;  Surgeon: Servando Salina, MD;  Location: [place];  Service: Gynecology;  Laterality: N/A;   LAPAROSCOPIC LYSIS OF ADHESIONS N/A 07/24/2019   Procedure: Laparoscopic Lysis Of Adhesions and Assist With Drainage of Pelvic Abscess;  Surgeon: Georganna Skeans, MD;  Location: [place];  Service: General;  Laterality: N/A;   LAPAROSCOPY N/A 07/24/2019   Procedure: LAPAROSCOPY DIAGNOSTIC;  Surgeon: Servando Salina, MD;  Location: [place];  Service: Gynecology;  Laterality: N/A;    Allergies: Gadolinium derivatives, Shellfish allergy, Sulfa antibiotics, and Iohexol  Medications: Prior to Admission medications   Medication Sig Start Date End Date Taking? Authorizing Provider  Black Currant Seed Oil 500 MG CAPS Take 1 capsule by mouth daily.   Yes [provider]  ferrous sulfate 325 (65 FE) MG tablet Take 325 mg by mouth daily with breakfast.   Yes [provider]  Peppermint Oil (IBGARD PO) Take 1 capsule by mouth daily.   Yes [provider]  vitamin E 1000 UNIT capsule Take 1,000 Units by mouth daily.   Yes [provider]     Family History  Problem Relation Age of Onset   Hyperlipidemia Mother    Hypertension Mother    Obesity Mother    Hyperlipidemia Father    Hypertension Father    Obesity Father    Diabetes Maternal Grandmother     Social History   Socioeconomic History   Marital status: Single    Spouse name: Not  on file   Number of children: Not on file   Years of education: Not on file   Highest education level: Not on file  Occupational History   Occupation: Social worker  Social Needs   Financial resource strain: Not on file   Food insecurity    Worry: Not on file    Inability: Not on file   Transportation needs    Medical: Not on file     Non-medical: Not on file  Tobacco Use   Smoking status: Never Smoker   Smokeless tobacco: Never Used  Substance and Sexual Activity   Alcohol use: Yes    Comment: occassional   Drug use: Not on file   Sexual activity: Not on file  Lifestyle   Physical activity    Days per week: Not on file    Minutes per session: Not on file   Stress: Not on file  Relationships   Social connections    Talks on phone: Not on file    Gets together: Not on file    Attends religious service: Not on file    Active member of club or organization: Not on file    Attends meetings of clubs or organizations: Not on file    Relationship status: Not on file  Other Topics Concern   Not on file  Social History Narrative   Not on file     Review of Systems: A 12 point ROS discussed and pertinent positives are indicated in the HPI above.  All other systems are negative.  Review of Systems  Constitutional: Negative for fatigue and fever.  Respiratory: Negative for cough and shortness of breath.   Cardiovascular: Negative for chest pain.  Gastrointestinal: Positive for abdominal pain. Negative for diarrhea, nausea and vomiting.  Genitourinary: Positive for pelvic pain. Negative for dysuria.  Psychiatric/Behavioral: Negative for behavioral problems and confusion.    Vital Signs: BP (!) 139/98 (BP Location: Left Arm)    Pulse 79    Temp 98.7 F (37.1 C) (Oral)    Resp 18    Ht 5\' 4"  (1.626 m)    Wt 163 lb 2.3 oz (74 kg)    LMP 07/06/2019 (Approximate)    SpO2 100%    BMI 28.00 kg/m   Physical Exam Vitals signs and nursing note reviewed.  Constitutional:      Appearance: Normal appearance.  HENT:     Mouth/Throat:     Mouth: Mucous membranes are moist.     Pharynx: Oropharynx is clear.  Cardiovascular:     Rate and Rhythm: Normal rate and regular rhythm.     Heart sounds: No murmur. No friction rub. No gallop.   Pulmonary:     Effort: Pulmonary effort is normal. No respiratory distress.       Breath sounds: Normal breath sounds.  Abdominal:     Palpations: Abdomen is soft.     Tenderness: There is abdominal tenderness.     Comments: Surgical drain in place with bloody output  Skin:    General: Skin is warm and dry.  Neurological:     General: No focal deficit present.     Mental Status: She is alert and oriented to person, place, and time. Mental status is at baseline.  Psychiatric:        Mood and Affect: Mood normal.        Behavior: Behavior normal.        Thought Content: Thought content normal.  Judgment: Judgment normal.      MD Evaluation Airway: WNL Heart: WNL Abdomen: WNL Chest/ Lungs: WNL ASA  Classification: 3 Mallampati/Airway Score: One   Imaging: Ct Abdomen Pelvis Wo Contrast  Result Date: 07/20/2019 CLINICAL DATA:  Left pelvic pain. Severe. Colonoscopy today. Previous pelvic abscess, percutaneous drainage on the right. EXAM: CT ABDOMEN AND PELVIS WITHOUT CONTRAST TECHNIQUE: Multidetector CT imaging of the abdomen and pelvis was performed following the standard protocol without IV contrast. COMPARISON:  CT 05/19/2019 FINDINGS: Lower chest: Linear lingular and right lower lobe atelectasis. No confluent airspace disease. No pleural fluid. Hepatobiliary: No focal liver abnormality is seen. No gallstones, gallbladder wall thickening, or biliary dilatation. Pancreas: No ductal dilatation or inflammation. Spleen: Normal in size without focal abnormality. Small splenule inferiorly. Adrenals/Urinary Tract: Normal adrenal glands. No hydronephrosis or perinephric edema. No renal stones. Ureters are decompressed. Urinary bladder is minimally distended, mild wall thickening about the dome. Stomach/Bowel: Lack of IV and enteric contrast limits assessment. Stomach is nondistended. Small bowel is decompressed. Pelvic bowel loops are suboptimally assessed. Fluid throughout the colon with air-fluid levels. No obvious colonic wall thickening, sigmoid colon not well  assessed. Irregular foci of air in the pelvis, difficult to confirm intraluminal bowel gas, for example image 81 series 3. Vascular/Lymphatic: Abdominal aorta is normal in caliber. Multiple prominent retroperitoneal lymph nodes which appears similar to prior exam under likely reactive. Reproductive: Intermediate density left adnexal lesion on prior exam persists, now with an air-fluid level, approximately 4.5 x 3.1 cm series 3, image 74. This may be contiguous with the right adnexa were there is irregular air and fluid, image 75 series 3. The uterus is poorly defined on the current exam. There is generalized fat stranding in the adnexa and pelvis. Other: Inflammatory process in the pelvis with fat stranding and free fluid. Adnexal structures are not well-defined. No upper abdominal ascites. No fluid collection in the right pericolic gutter at site of prior percutaneous strain. Musculoskeletal: There are no acute or suspicious osseous abnormalities. Scattered bone islands in the pelvis. IMPRESSION: 1. Patient with history complex bilateral adnexal masses, previously characterized as PID, indeterminate density lesions in the adnexa persist, however now contain air and air-fluid levels, and are poorly defined. There is generalized fat stranding in the pelvis with inflammation and free fluid. Findings are concerning for progression of tubo-ovarian abscess. Recommend GYN consultation. 2. Lack of IV and enteric contrast as well as inflammatory change limits detailed assessment of pelvic structures, including pelvic bowel loops. There are questionable foci of extraluminal air in the pelvis, which may be related to the adnexal process. Given recent colonoscopy, bowel perforation is difficult to exclude on imaging findings alone, however there is no colonic wall thickening to suggest this. 3. Mild bladder wall thickening about the dome, likely reactive. 4. Prominent retroperitoneal lymph nodes are similar to prior exam under  likely reactive. 5. No fluid collection in the right pericolic gutter at site of prior percutaneous strain. These results were called by telephone at the time of interpretation on 07/20/2019 at 7:57 pm to PA [facility] , who verbally acknowledged these results. Electronically Signed   By: Keith Rake M.D.   On: 07/20/2019 19:59   Ct Pelvis W Contrast  Result Date: 07/30/2019 CLINICAL DATA:  Tubo-ovarian abscess, pelvic inflammatory disease. EXAM: CT PELVIS WITH CONTRAST TECHNIQUE: Multidetector CT imaging of the pelvis was performed using the standard protocol following the bolus administration of intravenous contrast. CONTRAST:  150mL OMNIPAQUE IOHEXOL 300 MG/ML  SOLN  COMPARISON:  CT head July 20, 2019, September 4,  20 FINDINGS: Urinary Tract:  Distal ureters and bladder normal. Bowel:  Fluid-filled loops of colon.  No evidence of perforation. Vascular/Lymphatic: No pelvic lymphadenopathy. Reproductive:  Uterus grossly normal. Again demonstrated enhancing fluid collections in the LEFT and RIGHT adnexa with enhancing walls and interspersed gas. The fluid collection on the LEFT measures 5.4 x 3.5 cm compared with 4.5 x 3.1 cm for no significant change. The collection on the RIGHT measures 4.6 by 2.9 cm compared with 4.6 x 3.0 cm for no interval change. There is been interval placement of a drainage catheter in the ventral peritoneal space of the LEFT lower quadrant. This drainage catheter does not appear to communicate with the adnexal collections. There is a third collection in the deep LEFT pelvis mesorectal fat measuring 2.4 by 2.1 cm (image 26/3) which is also unchanged. Other: The several foci of subcutaneous gas presumably related to heparin injections in the anterior abdominal wall. Musculoskeletal: No aggressive osseous lesion IMPRESSION: 1. Essentially no change in the fluid collections in the LEFT and RIGHT adnexa. These fluid collections have enhancing rims and internal gas components  consistent with abscesses. Third collection in the deep LEFT pelvis probably communicates with the RIGHT adnexal collection. 2. Percutaneous drain in the ventral LEFT peritoneal space does not appear to communicate with the above-described collections. Electronically Signed   By: Suzy Bouchard M.D.   On: 07/30/2019 06:31   Dg Abd 2 Views  Result Date: 07/02/2019 CLINICAL DATA:  Abdominal pain. Bloating. Constipation. Evaluate for obstruction. EXAM: ABDOMEN - 2 VIEW COMPARISON:  CT 05/19/2019 FINDINGS: No bowel dilatation to suggest obstruction. No free intra-abdominal air. Small to moderate stool burden in the proximal colon, small volume of stool distally. No radiopaque calculi or abnormal soft tissue calcifications. Lower most lung bases are clear. Previous drainage catheter is been removed. IMPRESSION: Normal bowel gas pattern, no obstruction. Small to moderate volume of colonic stool. Electronically Signed   By: Keith Rake M.D.   On: 07/02/2019 01:49   [place] Ekg Site Rite  Result Date: 07/28/2019 If Site Rite image not attached, placement could not be confirmed due to current cardiac rhythm.   Labs:  CBC: Recent Labs    07/21/19 0107 07/23/19 1142 07/24/19 0811 07/25/19 0316 07/27/19 0851  WBC 16.0* 15.6*  --  13.3* 11.7*  HGB 9.3* 8.6* 10.2* 8.3* 10.3*  HCT 29.7* 26.3* 30.0* 26.3* 32.2*  PLT 379 294  --  338 362    COAGS: Recent Labs    05/09/19 1543  INR 1.2    BMP: Recent Labs    07/23/19 0302 07/24/19 0811 07/25/19 1436 07/27/19 0851 07/29/19 1344  NA 138 141 139 135 135  K 3.0* 4.2 3.3* 5.5* 4.5  CL 110  --  106 101 102  CO2 19*  --  26 25 22   GLUCOSE 101*  --  113* 102* 142*  BUN <5*  --  <5* <5* <5*  CALCIUM 7.8*  --  8.7* 9.1 9.7  CREATININE 0.78  --  0.89 0.91 1.05*  GFRNONAA >60  --  >60 >60 >60  GFRAA >60  --  >60 >60 >60    LIVER FUNCTION TESTS: Recent Labs    09/09/18 1720 05/02/19 0918 07/20/19 1634  BILITOT 0.4 2.1* 0.4  AST 14* 19  32  ALT 10 16 40  ALKPHOS 64 63 50  PROT 7.7 8.2* 7.4  ALBUMIN 3.9 2.8* 3.8    TUMOR  MARKERS: No results for input(s): AFPTM, CEA, CA199, CHROMGRNA in the last 8760 hours.  Assessment and Plan: Tubo-ovarian complex cysts with ongoing fluid collections IR consulted for aspiration and drainage. Case reviewed by Dr. Vernard Gambles who approves patient for procedure attempt however notes fluid collections has a very small window of access and may not be amenable.  Discussed with patient the plans for attempted drainage-- likely via TG approach.  NPO p MN.  Hold lovenox. INR ordered  Risks and benefits discussed with the patient including bleeding, infection, damage to adjacent structures, bowel perforation/fistula connection, and sepsis.  All of the patient's questions were answered, patient is agreeable to proceed. Consent signed and in chart.  Thank you for this interesting consult.  I greatly enjoyed meeting Gwendolyn Nguyen and look forward to participating in their care.  A copy of this report was sent to the requesting provider on this date.  Electronically Signed: Docia Barrier, PA 07/31/2019, 1:22 PM   I spent a total of 40 Minutes    in face to face in clinical consultation, greater than 50% of which was counseling/coordinating care for tubo-ovarian abscesses.

## 2019-08-01 ENCOUNTER — Inpatient Hospital Stay (HOSPITAL_COMMUNITY): Payer: BC Managed Care – PPO

## 2019-08-01 ENCOUNTER — Ambulatory Visit: Payer: BC Managed Care – PPO | Admitting: Physical Therapy

## 2019-08-01 LAB — CBC
HCT: 27.6 % — ABNORMAL LOW (ref 36.0–46.0)
Hemoglobin: 8.8 g/dL — ABNORMAL LOW (ref 12.0–15.0)
MCH: 28.7 pg (ref 26.0–34.0)
MCHC: 31.9 g/dL (ref 30.0–36.0)
MCV: 89.9 fL (ref 80.0–100.0)
Platelets: 391 10*3/uL (ref 150–400)
RBC: 3.07 MIL/uL — ABNORMAL LOW (ref 3.87–5.11)
RDW: 15.9 % — ABNORMAL HIGH (ref 11.5–15.5)
WBC: 15.8 10*3/uL — ABNORMAL HIGH (ref 4.0–10.5)
nRBC: 0.1 % (ref 0.0–0.2)

## 2019-08-01 LAB — BASIC METABOLIC PANEL
Anion gap: 10 (ref 5–15)
BUN: 12 mg/dL (ref 6–20)
CO2: 23 mmol/L (ref 22–32)
Calcium: 9.2 mg/dL (ref 8.9–10.3)
Chloride: 104 mmol/L (ref 98–111)
Creatinine, Ser: 0.98 mg/dL (ref 0.44–1.00)
GFR calc Af Amer: >60 mL/min (ref >60)
GFR calc non Af Amer: >60 mL/min (ref >60)
Glucose, Bld: 122 mg/dL — ABNORMAL HIGH (ref 70–99)
Potassium: 4.4 mmol/L (ref 3.5–5.1)
Sodium: 137 mmol/L (ref 135–145)

## 2019-08-01 LAB — PROTIME-INR
INR: 1 (ref 0.8–1.2)
Prothrombin Time: 13.5 seconds (ref 11.4–15.2)

## 2019-08-01 MED ORDER — KETOROLAC TROMETHAMINE 30 MG/ML IJ SOLN
30.0000 mg | Freq: Four times a day (QID) | INTRAMUSCULAR | Status: DC | PRN
  Administered 2019-08-01 – 2019-08-05 (×15): 30 mg via INTRAVENOUS
  Filled 2019-08-01 (×14): qty 1

## 2019-08-01 MED ORDER — KETOROLAC TROMETHAMINE 30 MG/ML IJ SOLN
INTRAMUSCULAR | Status: AC
  Filled 2019-08-01: qty 1

## 2019-08-01 NOTE — Progress Notes (Signed)
Patient had another CT of the pelvis today in order to evaluate for percutaneous drain placements.  Adnexal collections are not amendable to percutaneous drainage due to surrounding structures.

## 2019-08-01 NOTE — Progress Notes (Signed)
HD #13 A/G/C TOA S; no complaint. Pt sitting up in bed Pt did not have IR drainage today Per pt, toradol works better than oxycodone  O: BP (!) 138/97 (BP Location: Left Arm)   Pulse 82   Temp 98.4 F (36.9 C) (Oral)   Resp 18   Ht 5\' 4"  (1.626 m)   Wt 74 kg   LMP 07/06/2019 (Approximate)   SpO2 99%   BMI 28.00 kg/m    Lungs clear to A Cor RRR Abd soft active BS  JP site non tender( bloody fluid) Skin: fine red rash on abdomen/upper and lower extremity Pad none extr no edema  CBC Latest Ref Rng & Units 08/01/2019 07/27/2019 07/25/2019  WBC 4.0 - 10.5 K/uL 15.8(H) 11.7(H) 13.3(H)  Hemoglobin 12.0 - 15.0 g/dL 8.8(L) 10.3(L) 8.3(L)  Hematocrit 36.0 - 46.0 % 27.6(L) 32.2(L) 26.3(L)  Platelets 150 - 400 K/uL 391 362 338   IMP TOA on  A/G/C   disc the inability to do IR drainage. Reviewed the intraop findings . Cont with antibiotic for 14 days ( AGC) Also disc with pt( mother present) the fact that she has not spiked a temp throughout this and the guidance for treatment based on pelvic pain 2) allergic to contrast dye s/p prednisone and benadryl P) cont with antibiotics. Pain mgmt prn. May need medrol dose pack

## 2019-08-02 LAB — BASIC METABOLIC PANEL
Anion gap: 10 (ref 5–15)
BUN: 11 mg/dL (ref 6–20)
CO2: 25 mmol/L (ref 22–32)
Calcium: 8.7 mg/dL — ABNORMAL LOW (ref 8.9–10.3)
Chloride: 103 mmol/L (ref 98–111)
Creatinine, Ser: 1.16 mg/dL — ABNORMAL HIGH (ref 0.44–1.00)
GFR calc Af Amer: >60 mL/min (ref >60)
GFR calc non Af Amer: >60 mL/min (ref >60)
Glucose, Bld: 115 mg/dL — ABNORMAL HIGH (ref 70–99)
Potassium: 4.1 mmol/L (ref 3.5–5.1)
Sodium: 138 mmol/L (ref 135–145)

## 2019-08-02 MED ORDER — GENTAMICIN SULFATE 40 MG/ML IJ SOLN
7.0000 mg/kg | INTRAVENOUS | Status: AC
  Administered 2019-08-03 – 2019-08-05 (×2): 520 mg via INTRAVENOUS
  Filled 2019-08-02 (×2): qty 13

## 2019-08-02 NOTE — Progress Notes (Signed)
  S: no complaint (+) BM A/g/c Itching better on benadryl/steroid  O; BP 124/82 (BP Location: Left Arm)   Pulse 92   Temp 98.5 F (36.9 C) (Oral)   Resp 16   Ht 5\' 4"  (1.626 m)   Wt 74 kg   LMP 07/06/2019 (Approximate)   SpO2 100%   BMI 28.00 kg/m   Lungs clear to A  Cor RRR  abd soft non distended. Active BS JP drain in place Pad none Ext no edema  IMP: TOA on A/G/C  Reviewed with pt , at length, current diagnosis and mgmt. Hx again reviewed. Per pt dx with PID 2006. Per pt she has had several episodes of PID since then. She would do holistic treatments which would help for a period of time and then when sx returns she would  seek medical attention . Over past three years she has had the findings on both side and have been watched. Disc the previous admission where IR was successful at drainage, the intraoperative findings this time and the inability to drain the sites this time.Marland Kitchen disc with pt what open surgery would entail possibly complete removal of tubes, ovaries with /without uterus which would lead to sterility or need for assistance with getting pregnant. Pt comment on her age and pregnancy. Disc completing a 14 day course of IV antibiotics. Aiming to control pain. Pt agrees that she has never had a fever but reminded she did on the prior admission P) cont with  A/G/C. Disc with pharm the need to adjust gentamicin given the change in creatinine level. Advised pharm of end time for treatment. Will look into [place]  minimal invasive clinic as poss optionagree

## 2019-08-02 NOTE — Plan of Care (Signed)
  Problem: Health Behavior/Discharge Planning: Goal: Ability to manage health-related needs will improve Outcome: Progressing   

## 2019-08-02 NOTE — Progress Notes (Signed)
Pharmacy Antibiotic Note  Gwendolyn Nguyen is a 33 y.o. 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  Pharmacy has been consulted for gentamicin dosing (she is also on clindamycin and ampicillin) Scr is up today to 1.16. Given this information we will empirically extend the gentamicin interval in an attempt to prevent renal toxicity. Plan for 3 more days of antibiotic therapy   Plan: -Change gentamicin to 520 mg IV q48h with next dose tomorrow evening - BMET re-ordered for tomorrow morning - Planning 3 more days of therapy per MD   Height: 5\' 4"  (162.6 cm) Weight: 163 lb 2.3 oz (74 kg) IBW/kg (Calculated) : 54.7  Temp (24hrs), Avg:98.4 F (36.9 C), Min:98.4 F (36.9 C), Max:98.5 F (36.9 C)  Recent Labs  Lab 07/27/19 0851 07/29/19 1344 07/30/19 0423 08/01/19 0303 08/02/19 0411  WBC 11.7*  --   --  15.8*  --   CREATININE 0.91 1.05*  --  0.98 1.16*  GENTRANDOM  --   --  5.5  --   --     Estimated Creatinine Clearance: 69.2 mL/min (A) (by C-G formula based on SCr of 1.16 mg/dL (H)).    Allergies  Allergen Reactions  . Gadolinium Derivatives Hives, Itching and Nausea And Vomiting    Pt vomited immediately during injection.  15 minutes later, pt developed hives and itching.   . Shellfish Allergy Hives  . Sulfa Antibiotics Other (See Comments)    Patient was told to NOT TAKE THIS  . Iohexol Hives and Rash    08-2018, rash and hives, needs pre meds S/W PATIENT STATED REACTION FROM CT CONTRAST 11/19    Antimicrobials this admission: 10/3 gent >> 10/3 clinda >> 10/3 ampicillin >> 10/1 flagyl>>1-/3 9/30 doxy>>10/2 9/30 zosyn>>10/13   Dose adjustments this admission: 10/4  Increase gent 5mg /kg>>7mg /kg 10/10 Dec gent to 7mg /kg IV q36h 10/13 Dec gent to 7 mg/kg IV q 48   Microbiology results: 9/30 blood x2- ngtd  Jimmy Footman, PharmD, BCPS, BCIDP Infectious Diseases Clinical Pharmacist Phone: [phone] 08/02/2019 2:47  PM

## 2019-08-03 ENCOUNTER — Ambulatory Visit: Payer: BC Managed Care – PPO | Admitting: Physical Therapy

## 2019-08-03 LAB — BASIC METABOLIC PANEL
Anion gap: 13 (ref 5–15)
BUN: 10 mg/dL (ref 6–20)
CO2: 21 mmol/L — ABNORMAL LOW (ref 22–32)
Calcium: 8.1 mg/dL — ABNORMAL LOW (ref 8.9–10.3)
Chloride: 94 mmol/L — ABNORMAL LOW (ref 98–111)
Creatinine, Ser: 1.07 mg/dL — ABNORMAL HIGH (ref 0.44–1.00)
GFR calc Af Amer: >60 mL/min (ref >60)
GFR calc non Af Amer: >60 mL/min (ref >60)
Glucose, Bld: 453 mg/dL — ABNORMAL HIGH (ref 70–99)
Potassium: 3.8 mmol/L (ref 3.5–5.1)
Sodium: 128 mmol/L — ABNORMAL LOW (ref 135–145)

## 2019-08-03 MED ORDER — SODIUM CHLORIDE 0.9 % IV SOLN
INTRAVENOUS | Status: DC
  Administered 2019-08-03 – 2019-08-05 (×4): via INTRAVENOUS

## 2019-08-03 MED ORDER — CYCLOBENZAPRINE HCL 10 MG PO TABS
10.0000 mg | ORAL_TABLET | Freq: Three times a day (TID) | ORAL | Status: DC
  Administered 2019-08-03 – 2019-08-05 (×7): 10 mg via ORAL
  Filled 2019-08-03 (×7): qty 1

## 2019-08-03 NOTE — Progress Notes (Signed)
S: c/o back spasm and low mid abdomen cramping. The latter started with vaginal spotting which is noted only with wiping Pt has not needed to wear a pad Had BM today normal Notes a small knot at JP site Pt is on DMPA. Had Xulane patch for BTB but it did not help  O: BP (!) 137/92 (BP Location: Left Arm)   Pulse 68   Temp 98.3 F (36.8 C) (Oral)   Resp 18   Ht 5\' 4"  (1.626 m)   Wt 74 kg   LMP 07/06/2019 (Approximate)   SpO2 100%   BMI 28.00 kg/m  Lungs clear to A Cor RRR Abd soft active BS sl tender at JP site with 1cm ball. No overlying redness or drainage Pad none  extr no edema or calf tenderness CMP Latest Ref Rng & Units 08/03/2019 08/02/2019 08/01/2019  Glucose 70 - 99 mg/dL 453(H) 115(H) 122(H)  BUN 6 - 20 mg/dL 10 11 12   Creatinine 0.44 - 1.00 mg/dL 1.07(H) 1.16(H) 0.98  Sodium 135 - 145 mmol/L 128(L) 138 137  Potassium 3.5 - 5.1 mmol/L 3.8 4.1 4.4  Chloride 98 - 111 mmol/L 94(L) 103 104  CO2 22 - 32 mmol/L 21(L) 25 23  Calcium 8.9 - 10.3 mg/dL 8.1(L) 8.7(L) 9.2  Total Protein 6.5 - 8.1 g/dL - - -  Total Bilirubin 0.3 - 1.2 mg/dL - - -  Alkaline Phos 38 - 126 U/L - - -  AST 15 - 41 U/L - - -  ALT 0 - 44 U/L - - -  JP site culture pending Gram stain mod wbc IMP: TOA on D12 of A/G/C BTB on DMPA  hyperglycemia related to medrol dose pack and pre- CT scan medication should resolve with stopping medrol dose pack and switching to NS.  Muscle spasm P) pharm cont mgmt of gent Flexeril for muscle spasm. Warm heat to JP site. Complete IV antibiotic therapy. Advised no plan for further surgery on this admit

## 2019-08-04 NOTE — Progress Notes (Signed)
S:  Trying to get some sleep Notes some knots on both side of abdomen. Sore tol reg diet VS: T 99  BP 123/86  Lungs clear to A Cor RRR Abdomen soft non distended active BS throughout. Focal tender at JP site. On the side of the knots: left  Bruise. Right ecchymosis Pad none. Extr. No edema or calf tenderness  IMP; TOA on A/G/C P) cont with IV antibiotic. Warm compress to ecchymosis

## 2019-08-04 NOTE — Progress Notes (Signed)
Pt noticed two knots in her abdomen one on each side.  Left knot is bigger than right.  Pt stated the area is sore.  Heal applied on both areas and MD notified via secure chat.  Will continue to monitor.

## 2019-08-05 MED ORDER — CYCLOBENZAPRINE HCL 10 MG PO TABS: 10.0000 mg | ORAL_TABLET | Freq: Three times a day (TID) | ORAL | 1 refills | Status: DC | PRN

## 2019-08-05 MED ORDER — SIMETHICONE 80 MG PO CHEW: 80.0000 mg | CHEWABLE_TABLET | Freq: Four times a day (QID) | ORAL | 11 refills | Status: DC

## 2019-08-05 MED ORDER — MEFENAMIC ACID 250 MG PO CAPS: 1.0000 | ORAL_CAPSULE | Freq: Four times a day (QID) | ORAL | 5 refills | Status: DC | PRN

## 2019-08-05 MED ORDER — OXYCODONE HCL 5 MG PO TABS: 5.0000 mg | ORAL_TABLET | ORAL | 0 refills | Status: DC | PRN

## 2019-08-05 NOTE — Progress Notes (Signed)
2020 D/c orders placed by MD. Dicharged instructions given, and pt stated understanding. 2030 IV rn d/c'd Picc line as ordered.Need to stay for 63mins. post Picc removal. 2115 No sign of bleeding from Picc site. Will accompany pt downstairs for d/c.

## 2019-08-05 NOTE — Discharge Summary (Signed)
Physician Discharge Summary  Patient ID: Gwendolyn Nguyen MRN: [id number] DOB/AGE: 04-10-1986 33 y.o.  Admit date: 07/20/2019 Discharge date: 08/05/2019  Admission Diagnoses: LLQ pain. TOA  Discharge Diagnoses: TOA Principal Problem:   Tubo-ovarian abscess Active Problems:   Abdominal pain   TOA (tubo-ovarian abscess)   Discharged Condition: stable  Hospital Course: pt was admitted to internal medicine service due to LLQ pain and CT scan findings c/w TOA. Pt failed outpt mgmt. She was initially started on zosyn and doxycycline but did not respond . Pt was transferred to gyn service. She was changed to A/G/C. Due to persistence of pain and no marked change in wbc, she was taken to the OR with plan for bilateral salpingectomy. See op note for surgical findings. Extensive bowel adhesions and socked in pelvis was encountered. Some purulence was encountered in the pelvis with adhesiolysis by general surgeon. JP drain was placed and pt continued on the IV antibiotics for 14 days. She remained afebrile and IR consult regarding poss repeat drainage as was done on her prior admission in July was not feasible per IR assessment. On discharge pain resolved. Additional back pain was addressed with flexeril. She was also anemia which is not new. 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 Severe. Colonoscopy today. Previous pelvic abscess, percutaneous drainage on the right. EXAM: CT ABDOMEN AND PELVIS WITHOUT CONTRAST TECHNIQUE: Multidetector CT imaging of the abdomen and pelvis was  performed following the standard protocol without IV contrast. COMPARISON:  CT 05/19/2019 FINDINGS: Lower chest: Linear lingular and right lower lobe atelectasis. No confluent airspace disease. No pleural fluid. Hepatobiliary: No focal liver abnormality is seen. No gallstones, gallbladder wall thickening, or biliary dilatation. Pancreas: No ductal dilatation or inflammation. Spleen: Normal in size without focal abnormality. Small splenule inferiorly. Adrenals/Urinary Tract: Normal adrenal glands. No hydronephrosis or perinephric edema. No renal stones. Ureters are decompressed. Urinary bladder is minimally distended, mild wall thickening about the dome. Stomach/Bowel: Lack of IV and enteric contrast limits assessment. Stomach is nondistended. Small bowel is decompressed. Pelvic bowel loops are suboptimally assessed. Fluid throughout the colon with air-fluid levels. No obvious colonic wall thickening, sigmoid colon not well assessed. Irregular foci of air in the pelvis, difficult to confirm intraluminal bowel gas, for example image 81 series 3. Vascular/Lymphatic: Abdominal aorta is normal in caliber. Multiple prominent retroperitoneal lymph nodes which appears similar to prior exam under likely reactive. Reproductive: Intermediate density left adnexal lesion on prior exam persists, now with an air-fluid level, approximately 4.5 x 3.1 cm series 3, image 74. This may be contiguous with the right adnexa were there is irregular air and fluid, image 75 series 3. The uterus is poorly defined on the current exam. There is generalized fat stranding in the adnexa and pelvis. Other: Inflammatory process in the pelvis with fat stranding and free fluid. Adnexal structures are not well-defined. No upper abdominal ascites. No fluid collection in the right pericolic gutter at site of prior percutaneous strain. Musculoskeletal: There are no acute or suspicious osseous abnormalities. Scattered bone islands in  the pelvis. IMPRESSION:  1. Patient with history complex bilateral adnexal masses, previously characterized as PID, indeterminate density lesions in the adnexa persist, however now contain air and air-fluid levels, and are poorly defined. There is generalized fat stranding in the pelvis with inflammation and free fluid. Findings are concerning for progression of tubo-ovarian abscess. Recommend GYN consultation. 2. Lack of IV and enteric contrast as well as inflammatory change limits detailed assessment of pelvic structures, including pelvic bowel loops. There are questionable foci of extraluminal air in the pelvis, which may be related to the adnexal process. Given recent colonoscopy, bowel perforation is difficult to exclude on imaging findings alone, however there is no colonic wall thickening to suggest this. 3. Mild bladder wall thickening about the dome, likely reactive. 4. Prominent retroperitoneal lymph nodes are similar to prior exam under likely reactive. 5. No fluid collection in the right pericolic gutter at site of prior percutaneous strain. These results were called by telephone at the time of interpretation on 07/20/2019 at 7:57 pm to PA [facility] , who verbally acknowledged these results. Electronically Signed   By: Keith Rake M.D.   On: 07/20/2019 19:59   Ct Pelvis Wo Contrast  Result Date: 08/01/2019 CLINICAL DATA:  33 year old with history of tubo-ovarian abscesses. Evaluate for percutaneous drain placement. EXAM: CT PELVIS WITHOUT CONTRAST TECHNIQUE: Multidetector CT imaging of the pelvis was performed following the standard protocol without intravenous contrast. COMPARISON:  CT of the pelvis 07/29/2019 FINDINGS: Urinary Tract:  Small amount of fluid in the urinary bladder. Bowel:  No evidence for bowel dilatation or obstruction. Vascular/Lymphatic: Vascular structures are unremarkable. Small lymph nodes in the inguinal regions bilaterally. Reproductive: Limited evaluation of the uterus on this non contrast  examination. Again noted are multiple pockets of gas associated with the left adnexa. There is not a percutaneous window for drainage of the left adnexa. Left adnexa gas collection measures roughly 5.0 x 3.5 cm, previously measured 5.4 x 3.5 cm. There is decreased gas involving the right adnexa. There appears to be tiny locule of gas in the right adnexa. Right adnexa measures up to 4.4 cm in AP dimension and previously measured 4.6 cm. Small complex fluid collections in the left posterior pelvis are less conspicuous on this non contrast examination. Other: No significant ascites. Small amount of gas in the anterior pelvis near the urinary bladder. Not clear if this gas is within bowel loops. Again noted is a surgical drain in the left lower quadrant. Musculoskeletal: At least 2 small sclerotic foci in the left ilium that could represent bone islands. IMPRESSION: Adnexal collections are not amenable to percutaneous drainage. CT-guided drain placement was not performed. Bilateral adnexal collections have minimally changed in size since 07/29/2019. There is decreased gas involving the right adnexa. Electronically Signed   By: Markus Daft M.D.   On: 08/01/2019 14:48   Ct Pelvis W Contrast  Result Date: 07/30/2019 CLINICAL DATA:  Tubo-ovarian abscess, pelvic inflammatory disease. EXAM: CT PELVIS WITH CONTRAST TECHNIQUE: Multidetector CT imaging of the pelvis was performed using the standard protocol following the bolus administration of intravenous contrast. CONTRAST:  119mL OMNIPAQUE IOHEXOL 300 MG/ML  SOLN COMPARISON:  CT head July 20, 2019, September 4,  20 FINDINGS: Urinary Tract:  Distal ureters and bladder normal. Bowel:  Fluid-filled loops of colon.  No evidence of perforation. Vascular/Lymphatic: No pelvic lymphadenopathy. Reproductive:  Uterus grossly normal. Again demonstrated enhancing fluid collections in the LEFT and RIGHT adnexa with enhancing walls and interspersed gas. The fluid collection  on the  LEFT measures 5.4 x 3.5 cm compared with 4.5 x 3.1 cm for no significant change. The collection on the RIGHT measures 4.6 by 2.9 cm compared with 4.6 x 3.0 cm for no interval change. There is been interval placement of a drainage catheter in the ventral peritoneal space of the LEFT lower quadrant. This drainage catheter does not appear to communicate with the adnexal collections. There is a third collection in the deep LEFT pelvis mesorectal fat measuring 2.4 by 2.1 cm (image 26/3) which is also unchanged. Other: The several foci of subcutaneous gas presumably related to heparin injections in the anterior abdominal wall. Musculoskeletal: No aggressive osseous lesion IMPRESSION: 1. Essentially no change in the fluid collections in the LEFT and RIGHT adnexa. These fluid collections have enhancing rims and internal gas components consistent with abscesses. Third collection in the deep LEFT pelvis probably communicates with the RIGHT adnexal collection. 2. Percutaneous drain in the ventral LEFT peritoneal space does not appear to communicate with the above-described collections. Electronically Signed   By: Suzy Bouchard M.D.   On: 07/30/2019 06:31   [place] Ekg Site Rite  Result Date: 07/28/2019 If Site Rite image not attached, placement could not be confirmed due to current cardiac rhythm.  Treatments: antibiotics: Zosyn, gentamycin, metronidazole and clindamycin, ampicillin and surgery: dx laparoscopy, LOA  Discharge Exam: Blood pressure 114/82, pulse (!) 106, temperature 99.2 F (37.3 C), temperature source Oral, resp. rate 18, height 5\' 4"  (1.626 m), weight 74 kg, last menstrual period 07/06/2019, SpO2 100 %. General appearance: alert, cooperative and no distress Resp: clear to auscultation bilaterally Cardio: regular rate and rhythm, S1, S2 normal, no murmur, click, rub or gallop GI: soft, non-tender; bowel sounds normal; no masses,  no organomegaly Pelvic: deferred Extremities: no edema, redness or  tenderness in the calves or thighs  Disposition: Discharge disposition: 01-Home or Self Care       Discharge Instructions    Activity as tolerated - No restrictions   Complete by: As directed    Call MD for:  persistant nausea and vomiting   Complete by: As directed    Call MD for:  severe uncontrolled pain   Complete by: As directed    Call MD for:  temperature >100.4   Complete by: As directed    Diet general   Complete by: As directed      Allergies as of 08/05/2019      Reactions   Gadolinium Derivatives Hives, Itching, Nausea And Vomiting   Pt vomited immediately during injection.  15 minutes later, pt developed hives and itching.    Shellfish Allergy Hives   Sulfa Antibiotics Other (See Comments)   Patient was told to NOT TAKE THIS   Iohexol Hives, Rash   08-2018, rash and hives, needs pre meds S/W PATIENT STATED REACTION FROM CT CONTRAST 11/19      Medication List    STOP taking these medications   traMADol 50 MG tablet Commonly known as: Karma Lew 150-35 MCG/24HR transdermal patch Generic drug: norelgestromin-ethinyl estradiol     TAKE these medications   cyclobenzaprine 10 MG tablet Commonly known as: FLEXERIL Take 1 tablet (10 mg total) by mouth 3 (three) times daily as needed for muscle spasms.   ferrous sulfate 325 (65 FE) MG tablet Take 325 mg by mouth daily with breakfast.   medroxyPROGESTERone 150 MG/ML injection Commonly known as: DEPO-PROVERA Inject 150 mg into the muscle every 3 (three) months.   Mefenamic Acid 250  MG Caps Take 1 capsule (250 mg total) by mouth 4 (four) times daily as needed.   oxyCODONE 5 MG immediate release tablet Commonly known as: Oxy IR/ROXICODONE Take 1-2 tablets (5-10 mg total) by mouth every 4 (four) hours as needed (5mg  for moderate pain, 10mg  for severe pain).   simethicone 80 MG chewable tablet Commonly known as: MYLICON Chew 1 tablet (80 mg total) by mouth 4 (four) times daily.      Follow-up  Information    Servando Salina, MD Follow up on 08/12/2019.   Specialty: Obstetrics and Gynecology Contact information: [address] [place]  [id number] [phone]           Signed: Alanda Slim A Malak Duchesneau 08/05/2019, 8:10 PM

## 2019-08-05 NOTE — Progress Notes (Signed)
S: sitting up in bed. Mother present,. Some soreness but no pain. Back pain resolved with muscle relaxant  O: BP 114/82 (BP Location: Left Arm)   Pulse (!) 106   Temp 99.2 F (37.3 C) (Oral)   Resp 18   Ht 5\' 4"  (1.626 m)   Wt 74 kg   LMP 07/06/2019 (Approximate)   SpO2 100%   BMI 28.00 kg/m   Lungs clear to P Cor RRR Abd soft non distended non tender. 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 Pt agrees with plan.  F/u one week in office with sonogram. Refer outpt to [facility] minimal invasive surgery clinic  disc pain med mgmt. D/c instructions reviewed. Cont flexeril. Declined lidocaine patch

## 2019-08-05 NOTE — Discharge Instructions (Signed)
Pelvic Inflammatory Disease  Pelvic inflammatory disease (PID) is caused by an infection in some or all of the female reproductive organs. The infection can be in the uterus, ovaries, fallopian tubes, or the surrounding tissues in the pelvis. PID can cause abdominal or pelvic pain that comes on suddenly (acute pelvic pain). PID is a serious infection because it can lead to lasting (chronic) pelvic pain or the inability to have children (infertility). What are the causes? This condition is most often caused by bacteria that is spread during sexual contact. It can also be caused by a bacterial infection of the vagina (bacterial vaginosis) that is not spread by sexual contact. This condition occurs when the infection is not treated and the bacteria travel upward from the vagina or cervix into the reproductive organs. Bacteria may also be introduced into the reproductive organs following:  The birth of a baby.  A miscarriage.  An abortion.  Major pelvic surgery.  The insertion of an intrauterine device (IUD).  A sexual assault. What increases the risk? You are more likely to develop this condition if you:  Are younger than 31 years of age.  Are sexually active at a young age.  Have a history of STI (sexually transmitted infection) or PID.  Do not regularly use barrier contraception methods, such as condoms.  Have multiple sexual partners.  Have sex with someone who has symptoms of an STI.  Use a vaginal douche.  Have recently had an IUD inserted. What are the signs or symptoms? Symptoms of this condition include:  Abdominal or pelvic pain.  Fever.  Chills.  Abnormal vaginal discharge.  Abnormal uterine bleeding.  Unusual pain shortly after the end of a menstrual period.  Painful urination.  Pain with sex.  Nausea and vomiting. How is this diagnosed? This condition is diagnosed based on a pelvic exam and medical history. A pelvic exam can reveal signs of  infection, inflammation, and discharge in the vagina and the surrounding tissues. It can also help to identify painful areas. You may also have tests, such as:  Lab tests, including a pregnancy test, blood tests, and a urine test.  Culture tests of the vagina and cervix to check for an STI.  Ultrasound.  A laparoscopic procedure to look inside the pelvis.  Examination of vaginal discharge under a microscope. How is this treated? This condition may be treated with:  Antibiotic medicines taken by mouth (orally). For more severe cases, antibiotics may be given through an IV at the hospital.  Surgery. This is rare. Surgery may be needed if other treatments do not help.  Efforts to stop the spread of the infection. Sexual partners may need to be treated if the infection is caused by an STI. It may take weeks until you are completely well. If you are diagnosed with PID, you should also be checked for HIV (human immunodeficiency virus). Your health care provider may test you for infection again 3 months after treatment. You should not have unprotected sex. Follow these instructions at home:  Take over-the-counter and prescription medicines only as told by your health care provider.  If you were prescribed an antibiotic medicine, take it as told by your health care provider. Do not stop using the antibiotic even if you start to feel better.  Do not have sex until treatment is completed or as told by your health care provider. If PID is confirmed, your recent sexual partners will need treatment, especially if you had unprotected sex.  Keep all  follow-up visits as told by your health care provider. This is important. Contact a health care provider if:  You have increased or abnormal vaginal discharge.  Your pain does not improve.  You vomit.  You have a fever.  You cannot tolerate your medicines.  Your partner has an STI.  You have pain when you urinate. Get help right away  if:  You have increased abdominal or pelvic pain.  You have chills.  Your symptoms are not better in 72 hours with treatment. Summary  Pelvic inflammatory disease (PID) is caused by an infection in some or all of the female reproductive organs.  PID is a serious infection because it can lead to lasting (chronic) pelvic pain or the inability to have children (infertility).  This infection is usually treated with antibiotic medicines.  Do not have sex until treatment is completed or as told by your health care provider. This information is not intended to replace advice given to you by your health care provider. Make sure you discuss any questions you have with your health care provider. Document Released: 10/06/2005 Document Revised: 06/24/2018 Document Reviewed: 06/29/2018 Elsevier Patient Education  2020 [employer].

## 2019-08-07 LAB — AEROBIC/ANAEROBIC CULTURE W GRAM STAIN (SURGICAL/DEEP WOUND)

## 2019-08-08 ENCOUNTER — Ambulatory Visit: Payer: BC Managed Care – PPO | Admitting: Physical Therapy

## 2019-08-09 ENCOUNTER — Encounter: Payer: Self-pay | Admitting: Family Medicine

## 2019-08-09 ENCOUNTER — Telehealth: Payer: Self-pay | Admitting: Emergency Medicine

## 2019-08-09 ENCOUNTER — Ambulatory Visit: Payer: BC Managed Care – PPO | Admitting: Critical Care Medicine

## 2019-08-09 ENCOUNTER — Telehealth (INDEPENDENT_AMBULATORY_CARE_PROVIDER_SITE_OTHER): Payer: BC Managed Care – PPO | Admitting: Family Medicine

## 2019-08-09 ENCOUNTER — Other Ambulatory Visit: Payer: Self-pay

## 2019-08-09 VITALS — Ht 64.0 in | Wt 165.0 lb

## 2019-08-09 DIAGNOSIS — N739 Female pelvic inflammatory disease, unspecified: Secondary | ICD-10-CM

## 2019-08-09 DIAGNOSIS — D500 Iron deficiency anemia secondary to blood loss (chronic): Secondary | ICD-10-CM

## 2019-08-09 DIAGNOSIS — N7093 Salpingitis and oophoritis, unspecified: Secondary | ICD-10-CM | POA: Diagnosis not present

## 2019-08-09 DIAGNOSIS — N809 Endometriosis, unspecified: Secondary | ICD-10-CM | POA: Diagnosis not present

## 2019-08-09 DIAGNOSIS — R1013 Epigastric pain: Secondary | ICD-10-CM

## 2019-08-09 DIAGNOSIS — M6289 Other specified disorders of muscle: Secondary | ICD-10-CM | POA: Diagnosis not present

## 2019-08-09 MED ORDER — FAMOTIDINE 20 MG PO TABS: 20.0000 mg | ORAL_TABLET | Freq: Two times a day (BID) | ORAL | 3 refills | Status: DC

## 2019-08-09 NOTE — Telephone Encounter (Signed)
Left a msg for patient to return call to change her to a tele-med visit today with Dr Bridget Hartshorn Dr Kaleen Mask is not in the office today. Once we get patient changed over to a tele-med then Ayven Pheasant need to talk with her to get her triaged before her appt

## 2019-08-09 NOTE — Progress Notes (Signed)
VIDEO Encounter- SOAP NOTE Established Patient  This VIDEO encounter was conducted with the patient's (or proxy's) verbal consent via VIDEO telecommunications: yes/no: Yes Patient was instructed to have this encounter in a suitably private space; and to only have persons present to whom they give permission to participate. In addition, patient identity was confirmed by use of name plus two identifiers (DOB and address).  I discussed the limitations, risks, security and privacy concerns of performing an evaluation and management service by telephone and the availability of in person appointments. I also discussed with the patient that there may be a patient responsible charge related to this service. The patient expressed understanding and agreed to proceed.  I spent a total of TIME; 0 MIN TO 60 MIN: 25 minutes talking with the patient or their proxy.  CC: recurrent TOA,  Hospital follow up  Subjective   Gwendolyn Nguyen is a 33 y.o. established patient. Telephone visit today for  HPI   Patient is s/p laparascopic surgery 6/10 with oxycodone Every 4 hours Spasms are sporadic She reports pelvic spasms  She denies elevated temperature but feels feverish with chills She has been referred to  [facility].   She is taking iron supplements for her anemia She is on once a day iron  She received Depo Provera   She feels overwhelmed She reports t hat she has looked online for information for the pelvic pain clinic at [facility] She feels hopeful about her referral.  In the hospital she had too much scar tissue so the surgical team did not want to go in and operation She has not had children and does not want to lose her ovary. Depression screen [facility] 2/9 08/09/2019 07/18/2019 12/14/2018 11/25/2018  Decreased Interest 0 0 1 2  Down, Depressed, Hopeless 0 0 1 1  PHQ - 2 Score 0 0 2 3  Altered sleeping - - 2 1  Tired, decreased energy - - 2 2  Change in appetite - - 1  3  Feeling bad or failure about yourself  - - 0 1  Trouble concentrating - - 1 1  Moving slowly or fidgety/restless - - 2 0  Suicidal thoughts - - 0 0  PHQ-9 Score - - 10 11  Difficult doing work/chores - - - Not difficult at all   Abdominal Pain She had an EGD and a Colonoscopy 07/20/2019 She reports that she   1. Esophagus normal 2. Stomach with inflammation 3. Duodenum normal 4. Colon - noted a mass Return for repeat colonoscopy in 3 years  She has normal BM that are small but is not eating much She is not taking any reflux medication   Patient Active Problem List   Diagnosis Date Noted  . TOA (tubo-ovarian abscess) 07/24/2019  . Tubo-ovarian abscess 07/20/2019  . Abdominal pain 07/20/2019  . Physical deconditioning 05/14/2019  . PID (pelvic inflammatory disease) 05/14/2019  . Small bowel obstruction ([place])   . Pelvic mass 05/02/2019  . Elevated glucose 11/29/2018  . Class 1 obesity with serious comorbidity and body mass index (BMI) of 30.0 to 30.9 in adult 11/29/2018    Past Medical History:  Diagnosis Date  . Anemia   . Back pain   . Constipation   . Endometriosis   . IBS (irritable bowel syndrome)   . Low hemoglobin   . Multiple food allergies   . PID (acute pelvic inflammatory disease) 99991111   complicated by tuboovarian abscess    Current  Outpatient Medications  Medication Sig Dispense Refill  . cyclobenzaprine (FLEXERIL) 10 MG tablet Take 10 mg by mouth 3 (three) times daily as needed for muscle spasms.    . ferrous sulfate 325 (65 FE) MG tablet Take 325 mg by mouth daily with breakfast.    . medroxyPROGESTERone (DEPO-PROVERA) 150 MG/ML injection Inject 150 mg into the muscle every 3 (three) months.    . Mefenamic Acid 250 MG CAPS Take by mouth.    . oxyCODONE (OXY IR/ROXICODONE) 5 MG immediate release tablet Take 1-2 tablets (5-10 mg total) by mouth every 4 (four) hours as needed (5mg  for moderate pain, 10mg  for severe pain). 30 tablet 0  . simethicone  (MYLICON) 80 MG chewable tablet Chew 1 tablet (80 mg total) by mouth 4 (four) times daily. 30 tablet 11  . famotidine (PEPCID) 20 MG tablet Take 1 tablet (20 mg total) by mouth 2 (two) times daily. 60 tablet 3   No current facility-administered medications for this visit.     Allergies  Allergen Reactions  . Gadolinium Derivatives Hives, Itching and Nausea And Vomiting    Pt vomited immediately during injection.  15 minutes later, pt developed hives and itching.   . Shellfish Allergy Hives  . Sulfa Antibiotics Other (See Comments)    Patient was told to NOT TAKE THIS  . Iohexol Hives and Rash    08-2018, rash and hives, needs pre meds S/W PATIENT STATED REACTION FROM CT CONTRAST 11/19    Social History   Socioeconomic History  . Marital status: Single    Spouse name: Not on file  . Number of children: Not on file  . Years of education: Not on file  . Highest education level: Not on file  Occupational History  . Occupation: Social worker  Social Needs  . Financial resource strain: Not on file  . Food insecurity    Worry: Not on file    Inability: Not on file  . Transportation needs    Medical: Not on file    Non-medical: Not on file  Tobacco Use  . Smoking status: Never Smoker  . Smokeless tobacco: Never Used  Substance and Sexual Activity  . Alcohol use: Yes    Comment: occassional  . Drug use: Never  . Sexual activity: Not on file  Lifestyle  . Physical activity    Days per week: Not on file    Minutes per session: Not on file  . Stress: Not on file  Relationships  . Social Herbalist on phone: Not on file    Gets together: Not on file    Attends religious service: Not on file    Active member of club or organization: Not on file    Attends meetings of clubs or organizations: Not on file    Relationship status: Not on file  . Intimate partner violence    Fear of current or ex partner: Not on file    Emotionally abused: Not on file    Physically  abused: Not on file    Forced sexual activity: Not on file  Other Topics Concern  . Not on file  Social History Narrative  . Not on file    ROS Review of Systems  Constitutional: see hpi.  HENT: Negative for congestion, nosebleeds, trouble swallowing and voice change.   Respiratory: Negative for cough, shortness of breath and wheezing.   Gastrointestinal: Negative for diarrhea, nausea and vomiting. Abdominal and pelvic pain. Genitourinary: see hpi Musculoskeletal: Negative  for back pain, joint swelling and neck pain.  Neurological: +dizziness, no speech difficulty, +light-headedness without numbness.  See HPI. All other review of systems negative.   Objective   Vitals as reported by the patient: Today's Vitals   08/09/19 1025  Weight: 165 lb (74.8 kg)  Height: 5\' 4"  (1.626 m)    Diagnoses and all orders for this visit:  Endometriosis Muscle fatigue Tubo-ovarian abscess PID (pelvic inflammatory disease) Reviewed hospital note and patient will follow up at [facility] Pt advised to continue to rest at home -     Basic metabolic panel; Future  Iron deficiency anemia due to chronic blood loss -     CBC; Future -  Discussed diet and iron supplementation, last hgb <10 Pt is currently symptomatic with fatigue and lightheadedness Since she was recently discharge from the hospital and is still bleeding will await her evaluation at [facility]  Epigastric pain Patient read off her EGD and colonoscopy report and was noted to have gastritis so sent in Pepcid bid Discussed the follow up plan with [facility]  Other orders -     famotidine (PEPCID) 20 MG tablet; Take 1 tablet (20 mg total) by mouth 2 (two) times daily.     I discussed the assessment and treatment plan with the patient. The patient was provided an opportunity to ask questions and all were answered. The patient agreed with the plan and demonstrated an understanding of the instructions.   The patient was advised to call back or seek  an in-person evaluation if the symptoms worsen or if the condition fails to improve as anticipated.  I provided 25 minutes of face-to-face time during this encounter.  Forrest Moron, MD  Primary Care at [facility]

## 2019-08-10 ENCOUNTER — Ambulatory Visit: Payer: BC Managed Care – PPO | Admitting: Physical Therapy

## 2019-08-16 ENCOUNTER — Other Ambulatory Visit: Payer: Self-pay

## 2019-08-16 ENCOUNTER — Ambulatory Visit: Payer: BC Managed Care – PPO | Admitting: Physical Therapy

## 2019-08-16 ENCOUNTER — Encounter: Payer: Self-pay | Admitting: Physical Therapy

## 2019-08-16 DIAGNOSIS — M6281 Muscle weakness (generalized): Secondary | ICD-10-CM

## 2019-08-16 DIAGNOSIS — R262 Difficulty in walking, not elsewhere classified: Secondary | ICD-10-CM

## 2019-08-16 NOTE — Therapy (Signed)
[place], [place], [address] Phone: [phone]   Fax:  [phone]  Physical Therapy Treatment  Patient Details  Name: Gwendolyn Nguyen MRN: [id number] Date of Birth: Sep 19, 1986 Referring Provider (PT): Axel Filler, MD   Encounter Date: 08/16/2019    Past Medical History:  Diagnosis Date  . Anemia   . Back pain   . Constipation   . Endometriosis   . IBS (irritable bowel syndrome)   . Low hemoglobin   . Multiple food allergies   . PID (acute pelvic inflammatory disease) 99991111   complicated by tuboovarian abscess    Past Surgical History:  Procedure Laterality Date  . IR RADIOLOGIST EVAL & MGMT  05/19/2019  . IR RADIOLOGIST EVAL & MGMT  05/24/2019  . LAPAROSCOPIC LYSIS OF ADHESIONS N/A 07/24/2019   Procedure: Laparoscopic Lysis Of Adhesions, Drainage of Pelvic Abscess;  Surgeon: Servando Salina, MD;  Location: [place];  Service: Gynecology;  Laterality: N/A;  . LAPAROSCOPIC LYSIS OF ADHESIONS N/A 07/24/2019   Procedure: Laparoscopic Lysis Of Adhesions and Assist With Drainage of Pelvic Abscess;  Surgeon: Georganna Skeans, MD;  Location: [place];  Service: General;  Laterality: N/A;  . LAPAROSCOPY N/A 07/24/2019   Procedure: LAPAROSCOPY DIAGNOSTIC;  Surgeon: Servando Salina, MD;  Location: [place];  Service: Gynecology;  Laterality: N/A;    There were no vitals filed for this visit.                              PT Short Term Goals - 06/07/19 1720      PT SHORT TERM GOAL #1   Title  Pt will demo at least 1200 ft for 6MWT    Baseline  621ft at eval    Time  5    Period  Weeks    Status  New    [place] Date  07/15/19      PT SHORT TERM GOAL #2   Title  able to demo SLS at least 5s each side    Baseline  unable at eval    Time  5    Period  Weeks    Status  New    Target Date  07/15/19      PT SHORT TERM GOAL #3   Title  Pt will demo good form with  sit<>stand    Baseline  wide stance with hinge forward to shift weight    Time  5    Period  Weeks    Status  New    Target Date  07/15/19        PT Long Term Goals - 07/06/19 1710      PT LONG TERM GOAL #1   Title  pt will be able to navigate her stairs step over step wihtout difficulty    Baseline  still with difficulty    Time  10    Period  Weeks    Status  On-going      PT LONG TERM GOAL #2   Title  6MWT at least 2000 ft    Baseline  688 feet    Time  10    Period  Weeks    Status  On-going      PT LONG TERM GOAL #3   Title  5TSTS <10s    Baseline  28s at eval, not retested today    Time  10    Period  Weeks  Status  On-going      PT LONG TERM GOAL #4   Title  Pt will return to challenging HEP that will be carried on to long term exercise program    Baseline  unable    Time  10    Period  Weeks    Status  On-going      PT LONG TERM GOAL #5   Title  pt will be able to complete all household chores without limitaiton by fatigue or strength    Baseline  still limited/needs intermittent assist from family    Time  10    Period  Weeks    Status  On-going            Plan - 08/16/19 1642    Clinical Impression Statement  arrived s/p surgical procedure without clearance and is no longer under the care of the referring provider. Contacted OBGYN that she says she is under care of and reports told her to continue PT- when contacted, nurse manager said they did not refer her to PT. Has apppointment with specialty clinic at [facility] on Monday, asked her to contact us following that with a new referral should they want her to continue.       Patient will benefit from skilled therapeutic intervention in order to improve the following deficits and impairments:     Visit Diagnosis: Difficulty in walking, not elsewhere classified  Muscle weakness (generalized)     Problem List Patient Active Problem List   Diagnosis Date Noted  . TOA (tubo-ovarian abscess)  07/24/2019  . Tubo-ovarian abscess 07/20/2019  . Abdominal pain 07/20/2019  . Physical deconditioning 05/14/2019  . PID (pelvic inflammatory disease) 05/14/2019  . Small bowel obstruction ([place])   . Pelvic mass 05/02/2019  . Elevated glucose 11/29/2018  . Class 1 obesity with serious comorbidity and body mass index (BMI) of 30.0 to 30.9 in adult 11/29/2018    Nickey Canedo C. Tandi Hanko PT, DPT 08/16/19 4:45 PM   [facility] [address] [place], [place], [address] Phone: [phone]   Fax:  [phone]  Name: Gwendolyn Nguyen MRN: [id number] Date of Birth: 29-Jan-1986

## 2019-08-18 ENCOUNTER — Ambulatory Visit: Payer: BC Managed Care – PPO | Admitting: Physical Therapy

## 2019-08-19 ENCOUNTER — Other Ambulatory Visit: Payer: Self-pay

## 2019-08-19 ENCOUNTER — Encounter: Payer: Self-pay | Admitting: Family Medicine

## 2019-08-19 ENCOUNTER — Telehealth (INDEPENDENT_AMBULATORY_CARE_PROVIDER_SITE_OTHER): Payer: BC Managed Care – PPO | Admitting: Family Medicine

## 2019-08-19 VITALS — Ht 64.0 in | Wt 164.0 lb

## 2019-08-19 DIAGNOSIS — R5381 Other malaise: Secondary | ICD-10-CM

## 2019-08-19 DIAGNOSIS — R0600 Dyspnea, unspecified: Secondary | ICD-10-CM | POA: Diagnosis not present

## 2019-08-19 DIAGNOSIS — R1032 Left lower quadrant pain: Secondary | ICD-10-CM | POA: Diagnosis not present

## 2019-08-19 DIAGNOSIS — R21 Rash and other nonspecific skin eruption: Secondary | ICD-10-CM

## 2019-08-19 DIAGNOSIS — R0609 Other forms of dyspnea: Secondary | ICD-10-CM

## 2019-08-19 MED ORDER — HYDROCORTISONE 2.5 % EX CREA: TOPICAL_CREAM | Freq: Two times a day (BID) | CUTANEOUS | 0 refills | Status: DC

## 2019-08-19 NOTE — Progress Notes (Signed)
Telemedicine Encounter- SOAP NOTE Established Patient  This telephone encounter was conducted with the patient's (or proxy's) verbal consent via audio telecommunications: yes/no: Yes Patient was instructed to have this encounter in a suitably private space; and to only have persons present to whom they give permission to participate. In addition, patient identity was confirmed by use of name plus two identifiers (DOB and address).  I discussed the limitations, risks, security and privacy concerns of performing an evaluation and management service by telephone and the availability of in person appointments. I also discussed with the patient that there may be a patient responsible charge related to this service. The patient expressed understanding and agreed to proceed.  I spent a total of TIME; 0 MIN TO 60 MIN: 25 minutes talking with the patient or their proxy.  Chief Complaint  Patient presents with  . Referral     pt stated needed physical therapy for strenght/pelvic--had surgery 2 weeks ago DOS 07/24/19-Laparoscopic Lysis Of Adhesions, Drainage of Pelvic Abscess   . 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    Subjective   Gwendolyn Nguyen is a 33 y.o. established patient. Telephone visit today for  HPI Dyspnea on Exertion Patient reports that she has been having breathing problems that she would describe as the difficulty filling her lungs. She states that she cannot move around because she gets so tired. She now has to walk with a cane and has muscle weakness.  She reports that she just feels like her body is weak and her breathing is shallow. She is using her incentive spirometer She has not started PT She has been referred to PT for deconditioning She reports that she had surgery 2 weeks ago  Itching While in the hospital she developed a rash that required treatment Prior to discharge she developed  another rash and it causes itching She is taking zyrtec She would like something because the itching keeps her up at night  Abdominal pain She states that her bowels are not back to regular and she called GI but they have not called back. She is s/p colonoscopy She is s/p lap LOA and drainage of abscess.  Patient Active Problem List   Diagnosis Date Noted  . TOA (tubo-ovarian abscess) 07/24/2019  . Tubo-ovarian abscess 07/20/2019  . Abdominal pain 07/20/2019  . Physical deconditioning 05/14/2019  . PID (pelvic inflammatory disease) 05/14/2019  . Small bowel obstruction ([place])   . Pelvic mass 05/02/2019  . Elevated glucose 11/29/2018  . Class 1 obesity with serious comorbidity and body mass index (BMI) of 30.0 to 30.9 in adult 11/29/2018    Past Medical History:  Diagnosis Date  . Anemia   . Back pain   . Constipation   . Endometriosis   . IBS (irritable bowel syndrome)   . Low hemoglobin   . Multiple food allergies   . PID (acute pelvic inflammatory disease) 99991111   complicated by tuboovarian abscess    Current Outpatient Medications  Medication Sig Dispense Refill  . ferrous sulfate 325 (65 FE) MG tablet Take 325 mg by mouth daily with breakfast.    . medroxyPROGESTERone (DEPO-PROVERA) 150 MG/ML injection Inject 150 mg into the muscle every 3 (three) months.    . Mefenamic Acid 250 MG CAPS Take by mouth.    . hydrocortisone 2.5 % cream Apply topically 2 (two) times daily. 30 g 0   No current facility-administered medications for  this visit.     Allergies  Allergen Reactions  . Gadolinium Derivatives Hives, Itching and Nausea And Vomiting    Pt vomited immediately during injection.  15 minutes later, pt developed hives and itching.   . Shellfish Allergy Hives  . Sulfa Antibiotics Other (See Comments)    Patient was told to NOT TAKE THIS  . Iohexol Hives and Rash    08-2018, rash and hives, needs pre meds S/W PATIENT STATED REACTION FROM CT CONTRAST 11/19     Social History   Socioeconomic History  . Marital status: Single    Spouse name: Not on file  . Number of children: Not on file  . Years of education: Not on file  . Highest education level: Not on file  Occupational History  . Occupation: Social worker  Social Needs  . Financial resource strain: Not on file  . Food insecurity    Worry: Not on file    Inability: Not on file  . Transportation needs    Medical: Not on file    Non-medical: Not on file  Tobacco Use  . Smoking status: Never Smoker  . Smokeless tobacco: Never Used  Substance and Sexual Activity  . Alcohol use: Yes    Comment: occassional  . Drug use: Never  . Sexual activity: Not on file  Lifestyle  . Physical activity    Days per week: Not on file    Minutes per session: Not on file  . Stress: Not on file  Relationships  . Social Herbalist on phone: Not on file    Gets together: Not on file    Attends religious service: Not on file    Active member of club or organization: Not on file    Attends meetings of clubs or organizations: Not on file    Relationship status: Not on file  . Intimate partner violence    Fear of current or ex partner: Not on file    Emotionally abused: Not on file    Physically abused: Not on file    Forced sexual activity: Not on file  Other Topics Concern  . Not on file  Social History Narrative  . Not on file    ROS Review of Systems  Constitutional: Negative for activity change, appetite change, chills and fever.  HENT: Negative for congestion, nosebleeds, trouble swallowing and voice change.   Respiratory: Negative for cough, shortness of breath and wheezing.   Gastrointestinal: Negative for diarrhea, nausea and vomiting. +abdominal pain Genitourinary: Negative for difficulty urinating, dysuria, flank pain and hematuria.  Musculoskeletal: body aches and pains and weakness of her lower extremities Neurological: Negative for dizziness, speech difficulty,  light-headedness and numbness.  See HPI. All other review of systems negative.   Objective    Vitals as reported by the patient: Today's Vitals   08/19/19 1025  Weight: 164 lb (74.4 kg)  Height: 5\' 4"  (1.626 m)    Gwendolyn Nguyen was seen today for referral and breathing problem.  Diagnoses and all orders for this visit:  Physical deconditioning -     Ambulatory referral to Physical Therapy  Rash -     hydrocortisone 2.5 % cream; Apply topically 2 (two) times daily.  DOE (dyspnea on exertion)  Left lower quadrant abdominal pain     Rash - will give hydrocortisone, continue antihistamine DOE - contacted pulmonology but believe that PT would help as this sounds like deconditioning.  Discussed previous imaging and spirometry would be helpful Deconditioning -  reordered PT and advised her to continue her assistive device Abdominal Pain- LEFT - contact GI to discuss her BM   I discussed the assessment and treatment plan with the patient. The patient was provided an opportunity to ask questions and all were answered. The patient agreed with the plan and demonstrated an understanding of the instructions.   The patient was advised to call back or seek an in-person evaluation if the symptoms worsen or if the condition fails to improve as anticipated.  I provided 25 minutes of non-face-to-face time during this encounter.  Forrest Moron, MD  Primary Care at [facility]

## 2019-08-20 NOTE — Progress Notes (Signed)
Correction:  The visit on 08/19/2019 was a video visit.  The template was used in error.  This was a video visit using Dox[employer] Video and the patient was seen on facetime.  Exam Alert and oriented Head atraumatic Conjunctiva anicteric Normal pulmonary effort Not able to appreciate any skin rash

## 2019-08-25 ENCOUNTER — Other Ambulatory Visit: Payer: Self-pay

## 2019-08-25 ENCOUNTER — Ambulatory Visit: Payer: BC Managed Care – PPO | Attending: Family Medicine | Admitting: Physical Therapy

## 2019-08-25 DIAGNOSIS — R262 Difficulty in walking, not elsewhere classified: Secondary | ICD-10-CM | POA: Insufficient documentation

## 2019-08-25 DIAGNOSIS — M6281 Muscle weakness (generalized): Secondary | ICD-10-CM | POA: Diagnosis present

## 2019-08-26 ENCOUNTER — Encounter: Payer: Self-pay | Admitting: Physical Therapy

## 2019-08-26 NOTE — Therapy (Signed)
[place], [place], [address] Phone: [phone]   Fax:  [phone]  Physical Therapy Treatment/ERO  Patient Details  Name: Gwendolyn Nguyen MRN: [id number] Date of Birth: October 05, 1986 Referring Provider (PT): Forrest Moron MD   Encounter Date: 08/25/2019  PT End of Session - 08/25/19 1508    Visit Number  10    Number of Visits  34    Date for PT Re-Evaluation  11/18/19    Authorization Type  BCBS    PT Start Time  1416    PT Stop Time  1500    PT Time Calculation (min)  44 min    Activity Tolerance  Patient tolerated treatment well    Behavior During Therapy  [facility] for tasks assessed/performed       Past Medical History:  Diagnosis Date  . Anemia   . Back pain   . Constipation   . Endometriosis   . IBS (irritable bowel syndrome)   . Low hemoglobin   . Multiple food allergies   . PID (acute pelvic inflammatory disease) 99991111   complicated by tuboovarian abscess    Past Surgical History:  Procedure Laterality Date  . IR RADIOLOGIST EVAL & MGMT  05/19/2019  . IR RADIOLOGIST EVAL & MGMT  05/24/2019  . LAPAROSCOPIC LYSIS OF ADHESIONS N/A 07/24/2019   Procedure: Laparoscopic Lysis Of Adhesions, Drainage of Pelvic Abscess;  Surgeon: Servando Salina, MD;  Location: [place];  Service: Gynecology;  Laterality: N/A;  . LAPAROSCOPIC LYSIS OF ADHESIONS N/A 07/24/2019   Procedure: Laparoscopic Lysis Of Adhesions and Assist With Drainage of Pelvic Abscess;  Surgeon: Georganna Skeans, MD;  Location: [place];  Service: General;  Laterality: N/A;  . LAPAROSCOPY N/A 07/24/2019   Procedure: LAPAROSCOPY DIAGNOSTIC;  Surgeon: Servando Salina, MD;  Location: [place];  Service: Gynecology;  Laterality: N/A;    There were no vitals filed for this visit.  Subjective Assessment - 08/25/19 1506    Subjective  My left ovary hurts today. Visit at [facility] was frustrating, they said the cells from endometriosis can spread and  cause symptoms of other problems. I get exhausted, hard time breathing when caughing or sneezing. Is still working from home and tries to spread her appointments out.    How long can you walk comfortably?  always difficulty, uses [facility]    Patient Stated Goals  sleep, workout, daily activities    Currently in Pain?  Yes    Pain Location  Abdomen    Pain Orientation  Left    Pain Descriptors / Indicators  --   stomach ache   Aggravating Factors   unsure, constant pain, possibly diet    Pain Relieving Factors  occasionally medication, rest         [facility] PT Assessment - 08/25/19 0001      Assessment   Medical Diagnosis  physical deconditioning    Referring Provider (PT)  Delia Chimes A MD    Onset Date/Surgical Date  05/02/19    Hand Dominance  Right    Next MD Visit  11/11 dr Nolon Rod, 11/9 OB      Precautions   Precautions  Fall      Restrictions   Weight Bearing Restrictions  No      Balance Screen   Has the patient fallen in the past 6 months  No      [place] residence    Living Arrangements  Alone  Additional Comments  two sets of stairs      Prior Function   Level of Independence  Independent    Vocation  Full time employment    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      Cognition   Overall Cognitive Status  Within Functional Limits for tasks assessed      Sensation   Additional Comments  bil hands and feet N/T comes and goes      ROM / Strength   AROM / PROM / Strength  Strength      Strength   Overall Strength Comments  gross 3+/5      6 Minute Walk- Baseline   6 Minute Walk- Baseline  yes    BP (mmHg)  (!) 128/100    HR (bpm)  111    02 Sat (%RA)  99 %    Modified Borg Scale for Dyspnea  0- Nothing at all    Perceived Rate of Exertion (Borg)  11- Fairly light      6 Minute walk- Post Test   6 Minute Walk Post Test  yes    BP (mmHg)  (!) 146/96    HR (bpm)  112   peak 138, held at 135 for  most of walk   02 Sat (%RA)  98 %    Modified Borg Scale for Dyspnea  0- Nothing at all    Perceived Rate of Exertion (Borg)  13- Somewhat hard      6 minute walk test results    Aerobic Endurance Distance Walked  760    Endurance additional comments  no rest break, SPC in Rt hand                           PT Education - 08/25/19 1507    Education Details  6MWT, anatomy of condition, POC, HEP, exercise form/rationale, nutrition counseling    Person(s) Educated  Patient    Methods  Explanation    Comprehension  Verbalized understanding       PT Short Term Goals - 06/07/19 1720      PT SHORT TERM GOAL #1   Title  Pt will demo at least 1200 ft for 6MWT    Baseline  654ft at eval    Time  5    Period  Weeks    Status  New    Target Date  07/15/19      PT SHORT TERM GOAL #2   Title  able to demo SLS at least 5s each side    Baseline  unable at eval    Time  5    Period  Weeks    Status  New    Target Date  07/15/19      PT SHORT TERM GOAL #3   Title  Pt will demo good form with sit<>stand    Baseline  wide stance with hinge forward to shift weight    Time  5    Period  Weeks    Status  New    Target Date  07/15/19        PT Long Term Goals - 07/06/19 1710      PT LONG TERM GOAL #1   Title  pt will be able to navigate her stairs step over step wihtout difficulty    Baseline  still with difficulty    Time  10    Period  Weeks  Status  On-going      PT LONG TERM GOAL #2   Title  6MWT at least 2000 ft    Baseline  688 feet    Time  10    Period  Weeks    Status  On-going      PT LONG TERM GOAL #3   Title  5TSTS <10s    Baseline  28s at eval, not retested today    Time  10    Period  Weeks    Status  On-going      PT LONG TERM GOAL #4   Title  Pt will return to challenging HEP that will be carried on to long term exercise program    Baseline  unable    Time  10    Period  Weeks    Status  On-going      PT LONG TERM GOAL #5    Title  pt will be able to complete all household chores without limitaiton by fatigue or strength    Baseline  still limited/needs intermittent assist from family    Time  10    Period  Weeks    Status  On-going            Plan - 08/25/19 1500    Clinical Impression Statement  Pt presents to PT for ERO, diagnosis of physical deconditioning. She has many other medical diagnoses and recent hospitalizations that have reduced gains made previously with PT. She found a cousin who is an OBGYN in [place] and has a visit scheduled on Monday. she has lost a significant amount of weight since her last hospitalization. Gross strength is decreased and endurance is low, HR peaking at 138 during 6MWT and high resting BP increased following test. SBA provided during walk test as pt can become unstable with fatigue. Pt will benefit from skilled PT in order to address functional limitations.    Comorbidities  multiple internal processes affected by infection    Examination-Activity Limitations  Bathing;Locomotion Level;Bed Mobility;Bend;Sit;Carry;Sleep;Squat;Dressing;Stairs;Stand;Lift    Examination-Participation Restrictions  Meal Prep;Cleaning;Community Activity;Laundry;Shop    Stability/Clinical Decision Making  Unstable/Unpredictable    Clinical Decision Making  High    Rehab Potential  Good    PT Frequency  2x / week    PT Duration  12 weeks    PT Treatment/Interventions  ADLs/Self Care Home Management;Cryotherapy;Electrical Stimulation;Ultrasound;Moist Heat;Iontophoresis 4mg /ml Dexamethasone;Traction;Gait training;Stair training;Functional mobility training;Therapeutic activities;Balance training;Therapeutic exercise;Patient/family education;Neuromuscular re-education;Manual techniques;Passive range of motion;Dry needling;Spinal Manipulations;Taping    PT Next Visit Plan  restart HEP as she can tolerate, light UE weight lifting, update goals    Consulted and Agree with Plan of Care  Patient        Patient will benefit from skilled therapeutic intervention in order to improve the following deficits and impairments:  Difficulty walking, Decreased endurance, Decreased activity tolerance, Pain, Improper body mechanics, Decreased balance, Decreased strength, Postural dysfunction  Visit Diagnosis: Difficulty in walking, not elsewhere classified - Plan: PT plan of care cert/re-cert  Muscle weakness (generalized) - Plan: PT plan of care cert/re-cert     Problem List Patient Active Problem List   Diagnosis Date Noted  . TOA (tubo-ovarian abscess) 07/24/2019  . Tubo-ovarian abscess 07/20/2019  . Abdominal pain 07/20/2019  . Physical deconditioning 05/14/2019  . PID (pelvic inflammatory disease) 05/14/2019  . Small bowel obstruction ([place])   . Pelvic mass 05/02/2019  . Elevated glucose 11/29/2018  . Class 1 obesity with serious comorbidity and body mass index (BMI) of  30.0 to 30.9 in adult 11/29/2018    Marticia Reifschneider C. Charron Coultas PT, DPT 08/26/19 7:02 AM   [facility] [address] [place], [place], [address] Phone: [phone]   Fax:  [phone]  Name: Gwendolyn Nguyen MRN: [id number] Date of Birth: 11-23-1985

## 2019-08-31 ENCOUNTER — Encounter: Payer: Self-pay | Admitting: Family Medicine

## 2019-08-31 ENCOUNTER — Other Ambulatory Visit: Payer: Self-pay

## 2019-08-31 ENCOUNTER — Telehealth (INDEPENDENT_AMBULATORY_CARE_PROVIDER_SITE_OTHER): Payer: BC Managed Care – PPO | Admitting: Family Medicine

## 2019-08-31 DIAGNOSIS — G8929 Other chronic pain: Secondary | ICD-10-CM

## 2019-08-31 DIAGNOSIS — R109 Unspecified abdominal pain: Secondary | ICD-10-CM

## 2019-08-31 MED ORDER — CLONAZEPAM 0.5 MG PO TABS: 0.5000 mg | ORAL_TABLET | Freq: Every day | ORAL | 1 refills | Status: DC

## 2019-08-31 NOTE — Progress Notes (Signed)
Telemedicine Encounter- SOAP NOTE Established Patient  This telephone encounter was conducted with the patient's (or proxy's) verbal consent via audio telecommunications: yes/no: Yes Patient was instructed to have this encounter in a suitably private space; and to only have persons present to whom they give permission to participate. In addition, patient identity was confirmed by use of name plus two identifiers (DOB and address).  I discussed the limitations, risks, security and privacy concerns of performing an evaluation and management service by telephone and the availability of in person appointments. I also discussed with the patient that there may be a patient responsible charge related to this service. The patient expressed understanding and agreed to proceed.  I spent a total of TIME; 0 MIN TO 60 MIN: 25 minutes talking with the patient or their proxy.  CC: chronic abdominal pain  Subjective   Gwendolyn Nguyen is a 33 y.o. established patient. Telephone visit today for  HPI  She reports that even drinking water makes her abdomen hurt She has been on a BRAT diet without improvement She denies vomiting It keeps her up at night  She denies blood in her BM since her most recent conversation She is frustrated and very anxious about not being able to eat   Patient Active Problem List   Diagnosis Date Noted  . TOA (tubo-ovarian abscess) 07/24/2019  . Tubo-ovarian abscess 07/20/2019  . Abdominal pain 07/20/2019  . Physical deconditioning 05/14/2019  . PID (pelvic inflammatory disease) 05/14/2019  . Small bowel obstruction ([place])   . Pelvic mass 05/02/2019  . Elevated glucose 11/29/2018  . Class 1 obesity with serious comorbidity and body mass index (BMI) of 30.0 to 30.9 in adult 11/29/2018    Past Medical History:  Diagnosis Date  . Anemia   . Back pain   . Constipation   . Endometriosis   . IBS (irritable bowel syndrome)   . Low hemoglobin   . Multiple food allergies    . PID (acute pelvic inflammatory disease) 99991111   complicated by tuboovarian abscess    Current Outpatient Medications  Medication Sig Dispense Refill  . ferrous sulfate 325 (65 FE) MG tablet Take 325 mg by mouth daily with breakfast. FUSION PLUS 130 mg iron -1,250 mcg cap    . medroxyPROGESTERone (DEPO-PROVERA) 150 MG/ML injection Inject 150 mg into the muscle every 3 (three) months.    . Mefenamic Acid 250 MG CAPS Take by mouth.     No current facility-administered medications for this visit.     Allergies  Allergen Reactions  . Gadolinium Derivatives Hives, Itching and Nausea And Vomiting    Pt vomited immediately during injection.  15 minutes later, pt developed hives and itching.   . Shellfish Allergy Hives  . Sulfa Antibiotics Other (See Comments)    Patient was told to NOT TAKE THIS  . Iohexol Hives and Rash    08-2018, rash and hives, needs pre meds S/W PATIENT STATED REACTION FROM CT CONTRAST 11/19    Social History   Socioeconomic History  . Marital status: Single    Spouse name: Not on file  . Number of children: Not on file  . Years of education: Not on file  . Highest education level: Not on file  Occupational History  . Occupation: Social worker  Social Needs  . Financial resource strain: Not on file  . Food insecurity    Worry: Not on file    Inability: Not on file  . Transportation needs  Medical: Not on file    Non-medical: Not on file  Tobacco Use  . Smoking status: Never Smoker  . Smokeless tobacco: Never Used  Substance and Sexual Activity  . Alcohol use: Yes    Comment: occassional  . Drug use: Never  . Sexual activity: Not on file  Lifestyle  . Physical activity    Days per week: Not on file    Minutes per session: Not on file  . Stress: Not on file  Relationships  . Social Herbalist on phone: Not on file    Gets together: Not on file    Attends religious service: Not on file    Active member of club or organization: Not  on file    Attends meetings of clubs or organizations: Not on file    Relationship status: Not on file  . Intimate partner violence    Fear of current or ex partner: Not on file    Emotionally abused: Not on file    Physically abused: Not on file    Forced sexual activity: Not on file  Other Topics Concern  . Not on file  Social History Narrative  . Not on file    ROS Review of Systems  Constitutional: Negative for activity change, appetite change, chills and fever.  HENT: Negative for congestion, nosebleeds, trouble swallowing and voice change.   Respiratory: Negative for cough, shortness of breath and wheezing.   Gastrointestinal: see hpi Genitourinary: Negative for difficulty urinating, dysuria, flank pain and hematuria.  Musculoskeletal: chronic muscle weakness Neurological: Negative for dizziness, speech difficulty, light-headedness and numbness.  See HPI. All other review of systems negative.   Objective   Vitals as reported by the patient: There were no vitals filed for this visit.  Diagnoses and all orders for this visit:  Chronic abdominal pain -     Referral to Nutrition and Diabetes Services   Referred to Nutrition Mgmt for dietary support Will use clonazepam for this type of chronic abdominal pain Will increase dose or try valium Next office visit will complete contract Discussed risks and benefits    I discussed the assessment and treatment plan with the patient. The patient was provided an opportunity to ask questions and all were answered. The patient agreed with the plan and demonstrated an understanding of the instructions.   The patient was advised to call back or seek an in-person evaluation if the symptoms worsen or if the condition fails to improve as anticipated.  I provided 25 minutes of non-face-to-face time during this encounter.  Forrest Moron, MD  Primary Care at [facility]

## 2019-08-31 NOTE — Patient Instructions (Signed)
° ° ° °  If you have lab work done today you will be contacted with your lab results within the next 2 weeks.  If you have not heard from us then please contact us. The fastest way to get your results is to register for My Chart. ° ° °IF you received an x-ray today, you will receive an invoice from [facility]. Please contact [facility] at [phone] with questions or concerns regarding your invoice.  ° °IF you received labwork today, you will receive an invoice from [facility]. Please contact [facility] at [phone] with questions or concerns regarding your invoice.  ° °Our billing staff will not be able to assist you with questions regarding bills from these companies. ° °You will be contacted with the lab results as soon as they are available. The fastest way to get your results is to activate your My Chart account. Instructions are located on the last page of this paperwork. If you have not heard from us regarding the results in 2 weeks, please contact this office. °  ° ° ° °

## 2019-08-31 NOTE — Progress Notes (Signed)
Referral to deitrition due to food interolerance  and pt is still having abdominal pain.

## 2019-09-01 ENCOUNTER — Other Ambulatory Visit: Payer: Self-pay

## 2019-09-01 ENCOUNTER — Telehealth: Payer: Self-pay | Admitting: Family Medicine

## 2019-09-01 ENCOUNTER — Ambulatory Visit: Payer: BC Managed Care – PPO | Admitting: Physical Therapy

## 2019-09-01 ENCOUNTER — Encounter: Payer: Self-pay | Admitting: Physical Therapy

## 2019-09-01 VITALS — BP 143/110 | HR 134

## 2019-09-01 DIAGNOSIS — R262 Difficulty in walking, not elsewhere classified: Secondary | ICD-10-CM

## 2019-09-01 DIAGNOSIS — M6281 Muscle weakness (generalized): Secondary | ICD-10-CM

## 2019-09-01 NOTE — Therapy (Signed)
[place], [place], [address] Phone: [phone]   Fax:  [phone]  Physical Therapy Treatment  Patient Details  Name: Gwendolyn Nguyen MRN: [id number] Date of Birth: Nov 18, 1985 Referring Provider (PT): Forrest Moron MD   Encounter Date: 09/01/2019  PT End of Session - 09/01/19 1444    Visit Number  10    Number of Visits  34    Date for PT Re-Evaluation  11/18/19    Authorization Type  BCBS    PT Start Time  1412    PT Stop Time  1430    PT Time Calculation (min)  18 min       Past Medical History:  Diagnosis Date  . Anemia   . Back pain   . Constipation   . Endometriosis   . IBS (irritable bowel syndrome)   . Low hemoglobin   . Multiple food allergies   . PID (acute pelvic inflammatory disease) 99991111   complicated by tuboovarian abscess    Past Surgical History:  Procedure Laterality Date  . IR RADIOLOGIST EVAL & MGMT  05/19/2019  . IR RADIOLOGIST EVAL & MGMT  05/24/2019  . LAPAROSCOPIC LYSIS OF ADHESIONS N/A 07/24/2019   Procedure: Laparoscopic Lysis Of Adhesions, Drainage of Pelvic Abscess;  Surgeon: Servando Salina, MD;  Location: [place];  Service: Gynecology;  Laterality: N/A;  . LAPAROSCOPIC LYSIS OF ADHESIONS N/A 07/24/2019   Procedure: Laparoscopic Lysis Of Adhesions and Assist With Drainage of Pelvic Abscess;  Surgeon: Georganna Skeans, MD;  Location: [place];  Service: General;  Laterality: N/A;  . LAPAROSCOPY N/A 07/24/2019   Procedure: LAPAROSCOPY DIAGNOSTIC;  Surgeon: Servando Salina, MD;  Location: [place];  Service: Gynecology;  Laterality: N/A;    Vitals:   09/01/19 1443  BP: (!) 143/110  Pulse: (!) 134  SpO2: 98%    Subjective Assessment - 09/01/19 1437    Subjective  Pt. arrived as scheduled for therapy session noting some fatigue but otherwise no significant pain or symptoms. She reports had to fast until noon for medical test earlier today. Checked vitals with  findings as noted (also rechecked after rest with elevated values still noted) and decided to cancel session due to elevated resting HR, BP also elevated as noted. Suspect could be associated with dehydration and/or hypoglycemia from fasting this AM. Offered to contact Dr. Kaleen Mask office but pt. declined/reported Dr. Kaleen Mask off today. Instructed pt. to then monitor vitals at home and if after rest and fluid/food intake still having elevated values to contact MD office for further instructions.                                 PT Short Term Goals - 06/07/19 1720      PT SHORT TERM GOAL #1   Title  Pt will demo at least 1200 ft for 6MWT    Baseline  688ft at eval    Time  5    Period  Weeks    Status  New    Target Date  07/15/19      PT SHORT TERM GOAL #2   Title  able to demo SLS at least 5s each side    Baseline  unable at eval    Time  5    Period  Weeks    Status  New    Target Date  07/15/19      PT SHORT  TERM GOAL #3   Title  Pt will demo good form with sit<>stand    Baseline  wide stance with hinge forward to shift weight    Time  5    Period  Weeks    Status  New    Target Date  07/15/19        PT Long Term Goals - 07/06/19 1710      PT LONG TERM GOAL #1   Title  pt will be able to navigate her stairs step over step wihtout difficulty    Baseline  still with difficulty    Time  10    Period  Weeks    Status  On-going      PT LONG TERM GOAL #2   Title  6MWT at least 2000 ft    Baseline  688 feet    Time  10    Period  Weeks    Status  On-going      PT LONG TERM GOAL #3   Title  5TSTS <10s    Baseline  28s at eval, not retested today    Time  10    Period  Weeks    Status  On-going      PT LONG TERM GOAL #4   Title  Pt will return to challenging HEP that will be carried on to long term exercise program    Baseline  unable    Time  10    Period  Weeks    Status  On-going      PT LONG TERM GOAL #5   Title  pt will be  able to complete all household chores without limitaiton by fatigue or strength    Baseline  still limited/needs intermittent assist from family    Time  10    Period  Weeks    Status  On-going            Plan - 09/01/19 1445    Clinical Impression Statement  see subjective-session cancelled by therapist due to elevated resting vitals at start of treatment session    Personal Factors and Comorbidities  Comorbidity 1    Comorbidities  multiple internal processes affected by infection    Examination-Activity Limitations  Bathing;Locomotion Level;Bed Mobility;Bend;Sit;Carry;Sleep;Squat;Dressing;Stairs;Stand;Lift    Examination-Participation Restrictions  Meal Prep;Cleaning;Community Activity;Laundry;Shop    Stability/Clinical Decision Making  Unstable/Unpredictable    Clinical Decision Making  High    Rehab Potential  Good    PT Frequency  2x / week    PT Duration  12 weeks    PT Treatment/Interventions  ADLs/Self Care Home Management;Cryotherapy;Electrical Stimulation;Ultrasound;Moist Heat;Iontophoresis 4mg /ml Dexamethasone;Traction;Gait training;Stair training;Functional mobility training;Therapeutic activities;Balance training;Therapeutic exercise;Patient/family education;Neuromuscular re-education;Manual techniques;Passive range of motion;Dry needling;Spinal Manipulations;Taping    PT Next Visit Plan  monitor vitals as needed, restart HEP, light UE weightlifting, update goals    PT Home Exercise Plan  move during TV commercials, TrA, marching, bridge with ball, clam & reverse, anchored bands on physioball, primal push ups, heel raises    Consulted and Agree with Plan of Care  Patient       Patient will benefit from skilled therapeutic intervention in order to improve the following deficits and impairments:  Difficulty walking, Decreased endurance, Decreased activity tolerance, Pain, Improper body mechanics, Decreased balance, Decreased strength, Postural dysfunction  Visit  Diagnosis: Difficulty in walking, not elsewhere classified  Muscle weakness (generalized)     Problem List Patient Active Problem List   Diagnosis Date Noted  . TOA (tubo-ovarian abscess)  07/24/2019  . Tubo-ovarian abscess 07/20/2019  . Abdominal pain 07/20/2019  . Physical deconditioning 05/14/2019  . PID (pelvic inflammatory disease) 05/14/2019  . Small bowel obstruction ([place])   . Pelvic mass 05/02/2019  . Elevated glucose 11/29/2018  . Class 1 obesity with serious comorbidity and body mass index (BMI) of 30.0 to 30.9 in adult 11/29/2018    Beaulah Dinning, PT, DPT 09/01/19 2:48 PM  [facility] [address] [place], [place], [address] Phone: [phone]   Fax:  [phone]  Name: Gwendolyn Nguyen MRN: [id number] Date of Birth: [birth year]

## 2019-09-01 NOTE — Telephone Encounter (Signed)
09/01/2019 - PATIENT HAD A VIRTUAL TELEMED WITH DR. Nolon Rod ON Wednesday (08/31/2019) FOR ABDOMINAL PAIN. Gwendolyn Nguyen WOULD LIKE TO ASK DR. STALLINGS IF SHE CAN TAKE CLONAZEPAM 0.5mg  WHICH DR. STALLINGS PRESCRIBED AND METHOCARBAMOL 500 mg GIVEN TO HER BY [facility]HER? BEST PHONE [phone] (CELL) [place]

## 2019-09-03 NOTE — Telephone Encounter (Signed)
Please let the patient know that the muscle relaxer and the clonazepam should not be taken at the same time.  It can cause too much fatigue.

## 2019-09-05 ENCOUNTER — Encounter: Payer: Self-pay | Admitting: Physical Therapy

## 2019-09-05 ENCOUNTER — Ambulatory Visit: Payer: BC Managed Care – PPO | Admitting: Physical Therapy

## 2019-09-05 ENCOUNTER — Other Ambulatory Visit: Payer: Self-pay

## 2019-09-05 VITALS — BP 130/100 | HR 90

## 2019-09-05 DIAGNOSIS — R262 Difficulty in walking, not elsewhere classified: Secondary | ICD-10-CM

## 2019-09-05 DIAGNOSIS — M6281 Muscle weakness (generalized): Secondary | ICD-10-CM

## 2019-09-05 NOTE — Therapy (Signed)
[place], [place], [address] Phone: [phone]   Fax:  [phone]  Physical Therapy Treatment  Patient Details  Name: Gwendolyn Nguyen MRN: [id number] Date of Birth: 01-05-1986 Referring Provider (PT): Forrest Moron MD   Encounter Date: 09/05/2019  PT End of Session - 09/05/19 2144    Visit Number  11    Number of Visits  34    Date for PT Re-Evaluation  11/18/19    Authorization Type  BCBS    PT Start Time  1426    PT Stop Time  1512    PT Time Calculation (min)  46 min    Activity Tolerance  Patient tolerated treatment well    Behavior During Therapy  [facility] for tasks assessed/performed       Past Medical History:  Diagnosis Date  . Anemia   . Back pain   . Constipation   . Endometriosis   . IBS (irritable bowel syndrome)   . Low hemoglobin   . Multiple food allergies   . PID (acute pelvic inflammatory disease) 99991111   complicated by tuboovarian abscess    Past Surgical History:  Procedure Laterality Date  . IR RADIOLOGIST EVAL & MGMT  05/19/2019  . IR RADIOLOGIST EVAL & MGMT  05/24/2019  . LAPAROSCOPIC LYSIS OF ADHESIONS N/A 07/24/2019   Procedure: Laparoscopic Lysis Of Adhesions, Drainage of Pelvic Abscess;  Surgeon: Servando Salina, MD;  Location: [place];  Service: Gynecology;  Laterality: N/A;  . LAPAROSCOPIC LYSIS OF ADHESIONS N/A 07/24/2019   Procedure: Laparoscopic Lysis Of Adhesions and Assist With Drainage of Pelvic Abscess;  Surgeon: Georganna Skeans, MD;  Location: [place];  Service: General;  Laterality: N/A;  . LAPAROSCOPY N/A 07/24/2019   Procedure: LAPAROSCOPY DIAGNOSTIC;  Surgeon: Servando Salina, MD;  Location: [place];  Service: Gynecology;  Laterality: N/A;    Vitals:   09/05/19 1633  BP: (!) 130/100  Pulse: 90  SpO2: 98%    Subjective Assessment - 09/05/19 1633    Subjective  Still having issues with fatigue and reports was "sick" for several days this past week  with GI symptoms (no fever or respiratory symptoms).    Currently in Pain?  Yes    Pain Score  4     Pain Location  Abdomen    Pain Orientation  Left;Right;Lower    Pain Descriptors / Indicators  Dull    Pain Type  Chronic pain    Pain Onset  More than a month ago    Pain Frequency  Constant    Aggravating Factors   possible diet associations otherwise no specific aggs noted    Pain Relieving Factors  medication helps at night but not during the day                       [facility] Adult PT Treatment/Exercise - 09/05/19 0001      Lumbar Exercises: Aerobic   Recumbent Bike  L1 x 5 min   at end of session   Nustep  L5 x 6 min UE/LE      Lumbar Exercises: Standing   Heel Raises  20 reps    Heel Raises Limitations  on Airex    Functional Squats Limitations  TRX partial squat    Row Limitations  TRX row 2x10    Other Standing Lumbar Exercises  forward step ups x 10 ea. bilat. 4 in step., alternating lateral step up/over x 10 4  in. step, standing unilat. freemotion cable chest press 7 lbs. x 15 ea. bilat.    Other Standing Lumbar Exercises  wall push up x 15 reps, Pall off press with freemotion cable x 15 ea. way with 7 lbs.      Lumbar Exercises: Seated   Other Seated Lumbar Exercises  seated on 65 cm P-ball: Theraband rows green x15 reps, extension red x15 reps, seated marches on physioball x 15 reps             PT Education - 09/05/19 2141    Education Details  exercises    Person(s) Educated  Patient    Methods  Explanation;Verbal cues;Demonstration    Comprehension  Verbalized understanding;Returned demonstration       PT Short Term Goals - 09/05/19 2149      PT SHORT TERM GOAL #1   Title  Pt will demo at least 1200 ft for 6MWT    Baseline  760 feet    Time  5    Period  Weeks    Status  On-going    Target Date  09/29/19      PT SHORT TERM GOAL #2   Title  able to demo SLS at least 5s each side    Time  5    Period  Weeks    Status  On-going     Target Date  09/29/19      PT SHORT TERM GOAL #3   Title  Pt will demo good form with sit<>stand    Baseline  wide stance with hinge forward to shift weight    Time  5    Period  Weeks    Status  On-going    Target Date  09/29/19        PT Long Term Goals - 09/05/19 2150      PT LONG TERM GOAL #1   Title  pt will be able to navigate her stairs step over step wihtout difficulty    Baseline  unable to perform reciprocal gait    Time  10    Period  Weeks    Status  On-going    Target Date  11/18/19      PT LONG TERM GOAL #2   Title  6MWT at least 2000 ft    Baseline  760 feet    Time  10    Period  Weeks    Status  On-going    Target Date  11/17/18      PT LONG TERM GOAL #3   Title  5TSTS <10s    Baseline  28 sec at last assessment, not retested today    Time  10    Period  Weeks    Status  On-going    Target Date  11/18/19      PT LONG TERM GOAL #4   Title  Pt will return to challenging HEP that will be carried on to long term exercise program    Baseline  unable    Time  10    Period  Weeks    Status  On-going    Target Date  11/18/19      PT LONG TERM GOAL #5   Title  pt will be able to complete all household chores independently without limitaiton by fatigue or need for family member assistance    Baseline  still limited/needs intermittent assist from family    Time  10    Period  Weeks  Status  Revised    Target Date  11/18/19            Plan - 09/05/19 2145    Clinical Impression Statement  BP still elevated but vitals within acceptable limited for therapy session today. Focus work on core and general UE/LE strengthening to address associated limitations for stairs, gait and chores. Pt. able to complete exercises as directed with min cues. Progress ongoing re: therapy goals. Recommend follow up with MD regarding continued elevated BP.    Personal Factors and Comorbidities  Comorbidity 1    Comorbidities  multiple internal processes affected by  infection    Examination-Activity Limitations  Bathing;Locomotion Level;Bed Mobility;Bend;Sit;Carry;Sleep;Squat;Dressing;Stairs;Stand;Lift    Examination-Participation Restrictions  Meal Prep;Cleaning;Community Activity;Laundry;Shop    Stability/Clinical Decision Making  Unstable/Unpredictable    Clinical Decision Making  High    Rehab Potential  Good    PT Frequency  2x / week    PT Duration  12 weeks    PT Treatment/Interventions  ADLs/Self Care Home Management;Cryotherapy;Electrical Stimulation;Ultrasound;Moist Heat;Iontophoresis 4mg /ml Dexamethasone;Traction;Gait training;Stair training;Functional mobility training;Therapeutic activities;Balance training;Therapeutic exercise;Patient/family education;Neuromuscular re-education;Manual techniques;Passive range of motion;Dry needling;Spinal Manipulations;Taping    PT Next Visit Plan  monitor vitals as needed, continue exercise progression as tolerated    PT Home Exercise Plan  move during TV commercials, TrA, marching, bridge with ball, clam & reverse, anchored bands on physioball, primal push ups, heel raises    Consulted and Agree with Plan of Care  Patient       Patient will benefit from skilled therapeutic intervention in order to improve the following deficits and impairments:  Difficulty walking, Decreased endurance, Decreased activity tolerance, Pain, Improper body mechanics, Decreased balance, Decreased strength, Postural dysfunction  Visit Diagnosis: Difficulty in walking, not elsewhere classified  Muscle weakness (generalized)     Problem List Patient Active Problem List   Diagnosis Date Noted  . TOA (tubo-ovarian abscess) 07/24/2019  . Tubo-ovarian abscess 07/20/2019  . Abdominal pain 07/20/2019  . Physical deconditioning 05/14/2019  . PID (pelvic inflammatory disease) 05/14/2019  . Small bowel obstruction ([place])   . Pelvic mass 05/02/2019  . Elevated glucose 11/29/2018  . Class 1 obesity with serious comorbidity and  body mass index (BMI) of 30.0 to 30.9 in adult 11/29/2018    Beaulah Dinning, PT, DPT 09/05/19 9:55 PM  [facility] [address] [place], [place], [address] Phone: [phone]   Fax:  [phone]  Name: Gwendolyn Nguyen MRN: [id number] Date of Birth: 1986/05/02

## 2019-09-07 ENCOUNTER — Ambulatory Visit: Payer: BC Managed Care – PPO | Admitting: Physical Therapy

## 2019-09-07 ENCOUNTER — Other Ambulatory Visit: Payer: Self-pay

## 2019-09-07 ENCOUNTER — Encounter: Payer: Self-pay | Admitting: Physical Therapy

## 2019-09-07 ENCOUNTER — Encounter: Payer: Self-pay | Admitting: *Deleted

## 2019-09-07 VITALS — BP 134/104 | HR 114

## 2019-09-07 DIAGNOSIS — M6281 Muscle weakness (generalized): Secondary | ICD-10-CM

## 2019-09-07 DIAGNOSIS — R262 Difficulty in walking, not elsewhere classified: Secondary | ICD-10-CM

## 2019-09-07 NOTE — Therapy (Signed)
[place], [place], [address] Phone: [phone]   Fax:  [phone]  Physical Therapy Treatment  Patient Details  Name: Gwendolyn Nguyen MRN: [id number] Date of Birth: 11/28/1985 Referring Provider (PT): Forrest Moron MD   Encounter Date: 09/07/2019  PT End of Session - 09/07/19 1554    Visit Number  12    Number of Visits  34    Date for PT Re-Evaluation  11/18/19    Authorization Type  BCBS    PT Start Time  1543    PT Stop Time  1628    PT Time Calculation (min)  45 min    Activity Tolerance  Patient tolerated treatment well    Behavior During Therapy  [facility] for tasks assessed/performed       Past Medical History:  Diagnosis Date  . Anemia   . Back pain   . Constipation   . Endometriosis   . IBS (irritable bowel syndrome)   . Low hemoglobin   . Multiple food allergies   . PID (acute pelvic inflammatory disease) 99991111   complicated by tuboovarian abscess    Past Surgical History:  Procedure Laterality Date  . IR RADIOLOGIST EVAL & MGMT  05/19/2019  . IR RADIOLOGIST EVAL & MGMT  05/24/2019  . LAPAROSCOPIC LYSIS OF ADHESIONS N/A 07/24/2019   Procedure: Laparoscopic Lysis Of Adhesions, Drainage of Pelvic Abscess;  Surgeon: Servando Salina, MD;  Location: [place];  Service: Gynecology;  Laterality: N/A;  . LAPAROSCOPIC LYSIS OF ADHESIONS N/A 07/24/2019   Procedure: Laparoscopic Lysis Of Adhesions and Assist With Drainage of Pelvic Abscess;  Surgeon: Georganna Skeans, MD;  Location: [place];  Service: General;  Laterality: N/A;  . LAPAROSCOPY N/A 07/24/2019   Procedure: LAPAROSCOPY DIAGNOSTIC;  Surgeon: Servando Salina, MD;  Location: [place];  Service: Gynecology;  Laterality: N/A;    Vitals:   09/07/19 1550  BP: (!) 134/104  Pulse: (!) 114  SpO2: 97%    Subjective Assessment - 09/07/19 1549    Subjective  No signficant soreness noted after last session. No new complaints/concerns  otherwise this PM.                       [facility] Adult PT Treatment/Exercise - 09/07/19 0001      Lumbar Exercises: Aerobic   Nustep  L5 x 6 min UE/LE      Lumbar Exercises: Standing   Heel Raises  20 reps    Heel Raises Limitations  on Airex    Functional Squats Limitations   TRX partial squat 2x/10    Row Limitations  TRX row 2x10    Other Standing Lumbar Exercises  freemotion bilat. cable chest press 7 lbs. 2x10, freemotion pall off press 7 lbs. x 15 ea. way, step ups 6 in. x 15 ea. bilat.    Other Standing Lumbar Exercises  wall push up x 15 reps, SLS x 5 ea. side 5-10 sec holds on Airex      Lumbar Exercises: Seated   Other Seated Lumbar Exercises  seated on 65 cm P-ball: Theraband rows green x15 reps, extension red x15 reps, seated marches on physioball x 15 reps             PT Education - 09/07/19 1550    Education Details  contact MD re: consistently elevated BP       PT Short Term Goals - 09/05/19 2149      PT SHORT  TERM GOAL #1   Title  Pt will demo at least 1200 ft for 6MWT    Baseline  760 feet    Time  5    Period  Weeks    Status  On-going    Target Date  09/29/19      PT SHORT TERM GOAL #2   Title  able to demo SLS at least 5s each side    Time  5    Period  Weeks    Status  On-going    Target Date  09/29/19      PT SHORT TERM GOAL #3   Title  Pt will demo good form with sit<>stand    Baseline  wide stance with hinge forward to shift weight    Time  5    Period  Weeks    Status  On-going    Target Date  09/29/19        PT Long Term Goals - 09/05/19 2150      PT LONG TERM GOAL #1   Title  pt will be able to navigate her stairs step over step wihtout difficulty    Baseline  unable to perform reciprocal gait    Time  10    Period  Weeks    Status  On-going    Target Date  11/18/19      PT LONG TERM GOAL #2   Title  6MWT at least 2000 ft    Baseline  760 feet    Time  10    Period  Weeks    Status  On-going    Target  Date  11/17/18      PT LONG TERM GOAL #3   Title  5TSTS <10s    Baseline  28 sec at last assessment, not retested today    Time  10    Period  Weeks    Status  On-going    Target Date  11/18/19      PT LONG TERM GOAL #4   Title  Pt will return to challenging HEP that will be carried on to long term exercise program    Baseline  unable    Time  10    Period  Weeks    Status  On-going    Target Date  11/18/19      PT LONG TERM GOAL #5   Title  pt will be able to complete all household chores independently without limitaiton by fatigue or need for family member assistance    Baseline  still limited/needs intermittent assist from family    Time  10    Period  Weeks    Status  Revised    Target Date  11/18/19            Plan - 09/07/19 1554    Clinical Impression Statement  Concern regarding BP consistently elevated particularly for diastolic BP so advised contact MD. Still with expected weakness and decreased endurance but session/exercises well-tolerated and expect can continue to gradually progress with functional activity tolerance with continued therapy pending medical stability.    Personal Factors and Comorbidities  Comorbidity 1    Comorbidities  multiple internal processes affected by infection    Examination-Activity Limitations  Bathing;Locomotion Level;Bed Mobility;Bend;Sit;Carry;Sleep;Squat;Dressing;Stairs;Stand;Lift    Examination-Participation Restrictions  Meal Prep;Cleaning;Community Activity;Laundry;Shop    Stability/Clinical Decision Making  Unstable/Unpredictable    Clinical Decision Making  High    Rehab Potential  Good    PT Frequency  2x /  week    PT Duration  12 weeks    PT Treatment/Interventions  ADLs/Self Care Home Management;Cryotherapy;Electrical Stimulation;Ultrasound;Moist Heat;Iontophoresis 4mg /ml Dexamethasone;Traction;Gait training;Stair training;Functional mobility training;Therapeutic activities;Balance training;Therapeutic  exercise;Patient/family education;Neuromuscular re-education;Manual techniques;Passive range of motion;Dry needling;Spinal Manipulations;Taping    PT Next Visit Plan  monitor vitals as needed, continue exercise progression as tolerated, did pt. contact MD re: BP?    PT Home Exercise Plan  move during TV commercials, TrA, marching, bridge with ball, clam & reverse, anchored bands on physioball, primal push ups, heel raises    Consulted and Agree with Plan of Care  Patient       Patient will benefit from skilled therapeutic intervention in order to improve the following deficits and impairments:  Difficulty walking, Decreased endurance, Decreased activity tolerance, Pain, Improper body mechanics, Decreased balance, Decreased strength, Postural dysfunction  Visit Diagnosis: Difficulty in walking, not elsewhere classified  Muscle weakness (generalized)     Problem List Patient Active Problem List   Diagnosis Date Noted  . TOA (tubo-ovarian abscess) 07/24/2019  . Tubo-ovarian abscess 07/20/2019  . Abdominal pain 07/20/2019  . Physical deconditioning 05/14/2019  . PID (pelvic inflammatory disease) 05/14/2019  . Small bowel obstruction ([place])   . Pelvic mass 05/02/2019  . Elevated glucose 11/29/2018  . Class 1 obesity with serious comorbidity and body mass index (BMI) of 30.0 to 30.9 in adult 11/29/2018   Beaulah Dinning, PT, DPT 09/07/19 4:29 PM  [facility] [address] [place], [place], [address] Phone: [phone]   Fax:  [phone]  Name: Gwendolyn Nguyen MRN: [id number] Date of Birth: 02-Dec-1985

## 2019-09-07 NOTE — Telephone Encounter (Signed)
Mychart message sent.

## 2019-09-12 ENCOUNTER — Ambulatory Visit: Payer: BC Managed Care – PPO | Admitting: Physical Therapy

## 2019-09-12 ENCOUNTER — Telehealth: Payer: Self-pay | Admitting: Family Medicine

## 2019-09-12 ENCOUNTER — Encounter: Payer: Self-pay | Admitting: Physical Therapy

## 2019-09-12 ENCOUNTER — Other Ambulatory Visit: Payer: Self-pay

## 2019-09-12 VITALS — BP 127/97 | HR 112

## 2019-09-12 DIAGNOSIS — R262 Difficulty in walking, not elsewhere classified: Secondary | ICD-10-CM

## 2019-09-12 DIAGNOSIS — M6281 Muscle weakness (generalized): Secondary | ICD-10-CM

## 2019-09-12 NOTE — Telephone Encounter (Signed)
Pt came in office and wanted to leave msg for pcp that bp readings have been high. Pt states that her oxygen level is also not reading well.

## 2019-09-12 NOTE — Therapy (Signed)
[place], [place], [address] Phone: [phone]   Fax:  [phone]  Physical Therapy Treatment  Patient Details  Name: Gwendolyn Nguyen MRN: [id number] Date of Birth: 03/21/1986 Referring Provider (PT): Forrest Moron MD   Encounter Date: 09/12/2019  PT End of Session - 09/12/19 1418    Visit Number  13    Number of Visits  34    Date for PT Re-Evaluation  11/18/19    Authorization Type  BCBS    PT Start Time  1409    PT Stop Time  1459    PT Time Calculation (min)  50 min    Activity Tolerance  Patient tolerated treatment well    Behavior During Therapy  [facility] for tasks assessed/performed       Past Medical History:  Diagnosis Date  . Anemia   . Back pain   . Constipation   . Endometriosis   . IBS (irritable bowel syndrome)   . Low hemoglobin   . Multiple food allergies   . PID (acute pelvic inflammatory disease) 17/6160   complicated by tuboovarian abscess    Past Surgical History:  Procedure Laterality Date  . IR RADIOLOGIST EVAL & MGMT  05/19/2019  . IR RADIOLOGIST EVAL & MGMT  05/24/2019  . LAPAROSCOPIC LYSIS OF ADHESIONS N/A 07/24/2019   Procedure: Laparoscopic Lysis Of Adhesions, Drainage of Pelvic Abscess;  Surgeon: Servando Salina, MD;  Location: [place];  Service: Gynecology;  Laterality: N/A;  . LAPAROSCOPIC LYSIS OF ADHESIONS N/A 07/24/2019   Procedure: Laparoscopic Lysis Of Adhesions and Assist With Drainage of Pelvic Abscess;  Surgeon: Georganna Skeans, MD;  Location: [place];  Service: General;  Laterality: N/A;  . LAPAROSCOPY N/A 07/24/2019   Procedure: LAPAROSCOPY DIAGNOSTIC;  Surgeon: Servando Salina, MD;  Location: [place];  Service: Gynecology;  Laterality: N/A;    Vitals:   09/12/19 1414  BP: (!) 127/97  Pulse: (!) 112  SpO2: 99%    Subjective Assessment - 09/12/19 1414    Subjective  Pt. reports called MD office regarding BP issues and left message with RN-still  awaiting call back. Abdominal pain was worse earlier today but eased with medication (still 4/10).    Currently in Pain?  Yes    Pain Score  4     Pain Location  Abdomen    Pain Orientation  Right;Lower;Left    Pain Descriptors / Indicators  Aching    Pain Type  Chronic pain    Pain Onset  More than a month ago    Pain Frequency  Constant    Aggravating Factors   possible diet associations otherwise no specific aggs or eases noted    Pain Relieving Factors  medication         [facility] PT Assessment - 09/12/19 0001      Special Tests   Other special tests  5TST 23 seconds      Balance   Balance Assessed  Yes   SLS x 20 sec ea. bilat.                  [facility] Adult PT Treatment/Exercise - 09/12/19 0001      Lumbar Exercises: Aerobic   Recumbent Bike  L1 x 5 min    Nustep  L5 x 6 min UE/LE      Lumbar Exercises: Standing   Heel Raises  20 reps    Heel Raises Limitations  on Airex    Functional  Squats Limitations   TRX partial squat 2x/10   min cues for foot placement/feet shoulder width   Row Limitations  TRX row 2x10    Other Standing Lumbar Exercises  freemotion bilat. cable chest press 7 lbs. 2x10, freemotion pall off press 7 lbs. x 15 ea. way    Other Standing Lumbar Exercises  step ups 6 in. 2x10 ea. bilat. (front step up with single UE support on counter), alternating step up/over 4 in . steps laterally from side to side x 20 reps, SLS x 20 sec ea. bilat. for assessment, Rockerboard x 1 min lateral blue board and x 1 min fw/rev wooden board dynamic balance      Lumbar Exercises: Seated   Other Seated Lumbar Exercises  seated on P-ball Theraband rows blue 2x10, ext green 2x10, alt. marches x 20               PT Short Term Goals - 09/12/19 1433      PT SHORT TERM GOAL #1   Title  Pt will demo at least 1200 ft for 6MWT    Baseline  760 feet    Time  5    Period  Weeks    Status  On-going      PT SHORT TERM GOAL #2   Title  able to demo SLS at  least 5s each side    Baseline  20 sec bilat.    Time  5    Period  Weeks    Status  Achieved      PT SHORT TERM GOAL #3   Title  Pt will demo good form with sit<>stand    Baseline  wide stance with hinge forward to shift weight    Time  5    Period  Weeks    Status  On-going    Target Date  09/29/19        PT Long Term Goals - 09/12/19 1435      PT LONG TERM GOAL #1   Title  pt will be able to navigate her stairs step over step wihtout difficulty    Baseline  unable to perform with reciprocal gait    Time  10    Period  Weeks    Status  On-going      PT LONG TERM GOAL #2   Title  6MWT at least 2000 ft    Baseline  760 feet    Time  10    Period  Weeks    Status  On-going      PT LONG TERM GOAL #3   Title  5TSTS <10s    Baseline  23 seconds    Time  10    Period  Weeks    Status  On-going      PT LONG TERM GOAL #4   Title  Pt will return to challenging HEP that will be carried on to long term exercise program    Baseline  unable    Time  10    Period  Weeks    Status  On-going      PT LONG TERM GOAL #5   Title  pt will be able to complete all household chores independently without limitaiton by fatigue or need for family member assistance    Baseline  still limited/needs intermittent assist from family    Time  10    Period  Weeks    Status  On-going  Plan - 09/12/19 1444    Clinical Impression Statement  Balance STG for SLS met, mild improvement also for 5TST test. Still with fatigue but showing gradual improvement from previous status with balance, activity tolerance and strength.    Personal Factors and Comorbidities  Comorbidity 1    Comorbidities  multiple internal processes affected by infection    Examination-Activity Limitations  Bathing;Locomotion Level;Bed Mobility;Bend;Sit;Carry;Sleep;Squat;Dressing;Stairs;Stand;Lift    Examination-Participation Restrictions  Meal Prep;Cleaning;Community Activity;Laundry;Shop     Stability/Clinical Decision Making  Unstable/Unpredictable    Clinical Decision Making  High    Rehab Potential  Good    PT Frequency  2x / week    PT Duration  12 weeks    PT Treatment/Interventions  ADLs/Self Care Home Management;Cryotherapy;Electrical Stimulation;Ultrasound;Moist Heat;Iontophoresis 95m/ml Dexamethasone;Traction;Gait training;Stair training;Functional mobility training;Therapeutic activities;Balance training;Therapeutic exercise;Patient/family education;Neuromuscular re-education;Manual techniques;Passive range of motion;Dry needling;Spinal Manipulations;Taping    PT Next Visit Plan  monitor vitals as needed, continue exercise progression as tolerated    PT Home Exercise Plan  move during TV commercials, TrA, marching, bridge with ball, clam & reverse, anchored bands on physioball, primal push ups, heel raises    Consulted and Agree with Plan of Care  Patient       Patient will benefit from skilled therapeutic intervention in order to improve the following deficits and impairments:  Difficulty walking, Decreased endurance, Decreased activity tolerance, Pain, Improper body mechanics, Decreased balance, Decreased strength, Postural dysfunction  Visit Diagnosis: Difficulty in walking, not elsewhere classified  Muscle weakness (generalized)     Problem List Patient Active Problem List   Diagnosis Date Noted  . TOA (tubo-ovarian abscess) 07/24/2019  . Tubo-ovarian abscess 07/20/2019  . Abdominal pain 07/20/2019  . Physical deconditioning 05/14/2019  . PID (pelvic inflammatory disease) 05/14/2019  . Small bowel obstruction ([place]   . Pelvic mass 05/02/2019  . Elevated glucose 11/29/2018  . Class 1 obesity with serious comorbidity and body mass index (BMI) of 30.0 to 30.9 in adult 11/29/2018    CBeaulah Dinning PT, DPT 09/12/19 2:56 PM  [facility] [place] [address]Phone: [phone]   Fax:  [phone] Name: Gwendolyn DelagarzaMRN: [id number] of Birth: 5January 09, 1987

## 2019-09-13 ENCOUNTER — Encounter: Payer: Self-pay | Admitting: Family Medicine

## 2019-09-14 ENCOUNTER — Ambulatory Visit: Payer: BC Managed Care – PPO | Admitting: Physical Therapy

## 2019-09-14 ENCOUNTER — Encounter: Payer: Self-pay | Admitting: Physical Therapy

## 2019-09-14 ENCOUNTER — Other Ambulatory Visit: Payer: Self-pay

## 2019-09-14 VITALS — BP 128/103 | HR 106

## 2019-09-14 DIAGNOSIS — R262 Difficulty in walking, not elsewhere classified: Secondary | ICD-10-CM | POA: Diagnosis not present

## 2019-09-14 DIAGNOSIS — M6281 Muscle weakness (generalized): Secondary | ICD-10-CM

## 2019-09-14 NOTE — Therapy (Signed)
[place], [place], [address] Phone: [phone]   Fax:  [phone]  Physical Therapy Treatment  Patient Details  Name: Gwendolyn Nguyen MRN: [id number] Date of Birth: 08-13-1986 Referring Provider (PT): Forrest Moron MD   Encounter Date: 09/14/2019  PT End of Session - 09/14/19 1638    Visit Number  14    Number of Visits  34    Date for PT Re-Evaluation  11/18/19    Authorization Type  BCBS    PT Start Time  1545    PT Stop Time  1629    PT Time Calculation (min)  44 min    Activity Tolerance  Patient tolerated treatment well    Behavior During Therapy  [facility] for tasks assessed/performed       Past Medical History:  Diagnosis Date  . Anemia   . Back pain   . Constipation   . Endometriosis   . IBS (irritable bowel syndrome)   . Low hemoglobin   . Multiple food allergies   . PID (acute pelvic inflammatory disease) 99991111   complicated by tuboovarian abscess    Past Surgical History:  Procedure Laterality Date  . IR RADIOLOGIST EVAL & MGMT  05/19/2019  . IR RADIOLOGIST EVAL & MGMT  05/24/2019  . LAPAROSCOPIC LYSIS OF ADHESIONS N/A 07/24/2019   Procedure: Laparoscopic Lysis Of Adhesions, Drainage of Pelvic Abscess;  Surgeon: Servando Salina, MD;  Location: [place];  Service: Gynecology;  Laterality: N/A;  . LAPAROSCOPIC LYSIS OF ADHESIONS N/A 07/24/2019   Procedure: Laparoscopic Lysis Of Adhesions and Assist With Drainage of Pelvic Abscess;  Surgeon: Georganna Skeans, MD;  Location: [place];  Service: General;  Laterality: N/A;  . LAPAROSCOPY N/A 07/24/2019   Procedure: LAPAROSCOPY DIAGNOSTIC;  Surgeon: Servando Salina, MD;  Location: [place];  Service: Gynecology;  Laterality: N/A;    Vitals:   09/14/19 1552  BP: (!) 128/103  Pulse: (!) 106  SpO2: 99%    Subjective Assessment - 09/14/19 1551    Subjective  Still no word from MD regarding BP issues-pt. reports awaiting response from my  chart message. She continues to have digestive issues and is pending visit with nutritionist 09/22/19.                       [facility] Adult PT Treatment/Exercise - 09/14/19 0001      Lumbar Exercises: Aerobic   Recumbent Bike  L1 x 5 min   end of session   Nustep  L5 x 6 min UE/LE      Lumbar Exercises: Standing   Functional Squats Limitations  "touch and go" squat to incline wedge on low table holding 5 lb. KB 2x10    Row Limitations  Fre[employer]otion unilat. cable row 7 lbs. 2x10 ea. bilat.    Other Standing Lumbar Exercises  Freemotion alt. unilat. cable press 7 lb. 2x10 ea. bilat. in staggered stance, Theraband "woodchop" blue x 15 ea. way    Other Standing Lumbar Exercises  step ups 6 in. 2x10 ea. bilat. with bilat. UE support on counter, alternating side steps with stp up and over 4 in. step x 20 reps      Lumbar Exercises: Supine   Bridge  15 reps    Bridge Limitations  with PPT      Lumbar Exercises: Quadruped   Single Arm Raise  Right;Left;10 reps    Straight Leg Raise  10 reps    Straight Leg  Raises Limitations  bilateral alternating sides             PT Education - 09/14/19 1637    Education Details  POC, vitals-awaiting word from MD re: elevated BP/also pending nutritional consult next week    Person(s) Educated  Patient    Methods  Explanation    Comprehension  Verbalized understanding       PT Short Term Goals - 09/12/19 1433      PT SHORT TERM GOAL #1   Title  Pt will demo at least 1200 ft for 6MWT    Baseline  760 feet    Time  5    Period  Weeks    Status  On-going      PT SHORT TERM GOAL #2   Title  able to demo SLS at least 5s each side    Baseline  20 sec bilat.    Time  5    Period  Weeks    Status  Achieved      PT SHORT TERM GOAL #3   Title  Pt will demo good form with sit<>stand    Baseline  wide stance with hinge forward to shift weight    Time  5    Period  Weeks    Status  On-going    Target Date  09/29/19        PT  Long Term Goals - 09/12/19 1435      PT LONG TERM GOAL #1   Title  pt will be able to navigate her stairs step over step wihtout difficulty    Baseline  unable to perform with reciprocal gait    Time  10    Period  Weeks    Status  On-going      PT LONG TERM GOAL #2   Title  6MWT at least 2000 ft    Baseline  760 feet    Time  10    Period  Weeks    Status  On-going      PT LONG TERM GOAL #3   Title  5TSTS <10s    Baseline  23 seconds    Time  10    Period  Weeks    Status  On-going      PT LONG TERM GOAL #4   Title  Pt will return to challenging HEP that will be carried on to long term exercise program    Baseline  unable    Time  10    Period  Weeks    Status  On-going      PT LONG TERM GOAL #5   Title  pt will be able to complete all household chores independently without limitaiton by fatigue or need for family member assistance    Baseline  still limited/needs intermittent assist from family    Time  10    Period  Weeks    Status  On-going            Plan - 09/14/19 1639    Clinical Impression Statement  Mild gains with improved activity tolerance from previous status and improved ability closed-kinetic chain LE strength for step-ups and squatting motions. Continued concern elevated diastolic BP as well as HR pending further word from MD regarding status with this.    Personal Factors and Comorbidities  Comorbidity 1    Comorbidities  multiple internal processes affected by infection    Examination-Activity Limitations  Bathing;Locomotion Level;Bed Mobility;Bend;Sit;Carry;Sleep;Squat;Dressing;Stairs;Stand;Lift    Examination-Participation Restrictions  Meal Prep;Cleaning;Community Activity;Laundry;Shop    Stability/Clinical Decision Making  Unstable/Unpredictable    Clinical Decision Making  High    Rehab Potential  Good    PT Frequency  2x / week    PT Duration  12 weeks    PT Treatment/Interventions  ADLs/Self Care Home Management;Cryotherapy;Electrical  Stimulation;Ultrasound;Moist Heat;Iontophoresis 4mg /ml Dexamethasone;Traction;Gait training;Stair training;Functional mobility training;Therapeutic activities;Balance training;Therapeutic exercise;Patient/family education;Neuromuscular re-education;Manual techniques;Passive range of motion;Dry needling;Spinal Manipulations;Taping    PT Next Visit Plan  monitor vitals as needed, continue exercise progression as tolerated    PT Home Exercise Plan  move during TV commercials, step ups, partial squat, TrA, marching, bridge with ball, clam & reverse, anchored bands on physioball, primal push ups, heel raises    Consulted and Agree with Plan of Care  Patient       Patient will benefit from skilled therapeutic intervention in order to improve the following deficits and impairments:  Difficulty walking, Decreased endurance, Decreased activity tolerance, Pain, Improper body mechanics, Decreased balance, Decreased strength, Postural dysfunction  Visit Diagnosis: Difficulty in walking, not elsewhere classified  Muscle weakness (generalized)     Problem List Patient Active Problem List   Diagnosis Date Noted  . TOA (tubo-ovarian abscess) 07/24/2019  . Tubo-ovarian abscess 07/20/2019  . Abdominal pain 07/20/2019  . Physical deconditioning 05/14/2019  . PID (pelvic inflammatory disease) 05/14/2019  . Small bowel obstruction ([place])   . Pelvic mass 05/02/2019  . Elevated glucose 11/29/2018  . Class 1 obesity with serious comorbidity and body mass index (BMI) of 30.0 to 30.9 in adult 11/29/2018    Beaulah Dinning, PT, DPT 09/14/19 4:42 PM  [facility] [address] [place], [place], [address] Phone: [phone]   Fax:  [phone]  Name: Gwendolyn Nguyen MRN: [id number] Date of Birth: Apr 08, 1986

## 2019-09-20 ENCOUNTER — Other Ambulatory Visit: Payer: Self-pay

## 2019-09-20 ENCOUNTER — Encounter: Payer: Self-pay | Admitting: Physical Therapy

## 2019-09-20 ENCOUNTER — Ambulatory Visit: Payer: BC Managed Care – PPO | Attending: Family Medicine | Admitting: Physical Therapy

## 2019-09-20 DIAGNOSIS — R262 Difficulty in walking, not elsewhere classified: Secondary | ICD-10-CM | POA: Diagnosis present

## 2019-09-20 DIAGNOSIS — M6281 Muscle weakness (generalized): Secondary | ICD-10-CM

## 2019-09-20 NOTE — Therapy (Signed)
[place], [place], [address] Phone: [phone]   Fax:  [phone]  Physical Therapy Treatment  Patient Details  Name: Gwendolyn Nguyen MRN: [id number] Date of Birth: [birth year] Referring Provider (PT): Forrest Moron MD   Encounter Date: 09/20/2019  PT End of Session - 09/20/19 1716    Visit Number  15    Number of Visits  34    Date for PT Re-Evaluation  11/18/19    PT Start Time  1630    PT Stop Time  1705    PT Time Calculation (min)  35 min    Activity Tolerance  Patient tolerated treatment well;Patient limited by fatigue    Behavior During Therapy  [facility] for tasks assessed/performed       Past Medical History:  Diagnosis Date  . Anemia   . Back pain   . Constipation   . Endometriosis   . IBS (irritable bowel syndrome)   . Low hemoglobin   . Multiple food allergies   . PID (acute pelvic inflammatory disease) 99991111   complicated by tuboovarian abscess    Past Surgical History:  Procedure Laterality Date  . IR RADIOLOGIST EVAL & MGMT  05/19/2019  . IR RADIOLOGIST EVAL & MGMT  05/24/2019  . LAPAROSCOPIC LYSIS OF ADHESIONS N/A 07/24/2019   Procedure: Laparoscopic Lysis Of Adhesions, Drainage of Pelvic Abscess;  Surgeon: Servando Salina, MD;  Location: [place];  Service: Gynecology;  Laterality: N/A;  . LAPAROSCOPIC LYSIS OF ADHESIONS N/A 07/24/2019   Procedure: Laparoscopic Lysis Of Adhesions and Assist With Drainage of Pelvic Abscess;  Surgeon: Georganna Skeans, MD;  Location: [place];  Service: General;  Laterality: N/A;  . LAPAROSCOPY N/A 07/24/2019   Procedure: LAPAROSCOPY DIAGNOSTIC;  Surgeon: Servando Salina, MD;  Location: [place];  Service: Gynecology;  Laterality: N/A;    There were no vitals filed for this visit.      [facility] PT Assessment - 09/20/19 0001      6 minute walk test results    Aerobic Endurance Distance Walked  1017    Endurance additional comments  no rest break,  SPC, HR to 112, O2 to 97%                   [facility] Adult PT Treatment/Exercise - 09/20/19 0001      Lumbar Exercises: Aerobic   Tread Mill  incline 10 speed .7, 3 min    Nustep  5 min L6 LE only    Other Aerobic Exercise  6 min walk      Lumbar Exercises: Standing   Forward Lunge Limitations  4" step up with knee drive    Wall Slides Limitations  10x5s    Other Standing Lumbar Exercises  slow march with balance control               PT Short Term Goals - 09/12/19 1433      PT SHORT TERM GOAL #1   Title  Pt will demo at least 1200 ft for 6MWT    Baseline  760 feet    Time  5    Period  Weeks    Status  On-going      PT SHORT TERM GOAL #2   Title  able to demo SLS at least 5s each side    Baseline  20 sec bilat.    Time  5    Period  Weeks    Status  Achieved  PT SHORT TERM GOAL #3   Title  Pt will demo good form with sit<>stand    Baseline  wide stance with hinge forward to shift weight    Time  5    Period  Weeks    Status  On-going    Target Date  09/29/19        PT Long Term Goals - 09/12/19 1435      PT LONG TERM GOAL #1   Title  pt will be able to navigate her stairs step over step wihtout difficulty    Baseline  unable to perform with reciprocal gait    Time  10    Period  Weeks    Status  On-going      PT LONG TERM GOAL #2   Title  6MWT at least 2000 ft    Baseline  760 feet    Time  10    Period  Weeks    Status  On-going      PT LONG TERM GOAL #3   Title  5TSTS <10s    Baseline  23 seconds    Time  10    Period  Weeks    Status  On-going      PT LONG TERM GOAL #4   Title  Pt will return to challenging HEP that will be carried on to long term exercise program    Baseline  unable    Time  10    Period  Weeks    Status  On-going      PT LONG TERM GOAL #5   Title  pt will be able to complete all household chores independently without limitaiton by fatigue or need for family member assistance    Baseline  still  limited/needs intermittent assist from family    Time  10    Period  Weeks    Status  On-going            Plan - 09/20/19 1716    Clinical Impression Statement  Improved distance covered in 6MWT today but noted spike in HR continues without symptoms other than fatigue. Poor control of knee extension addressed while on nustep. Feels that she has very poor tolerance to stairs so exercises today were targeted toward the strength, balance and endurance necessary for this.    PT Treatment/Interventions  ADLs/Self Care Home Management;Cryotherapy;Electrical Stimulation;Ultrasound;Moist Heat;Iontophoresis 4mg /ml Dexamethasone;Traction;Gait training;Stair training;Functional mobility training;Therapeutic activities;Balance training;Therapeutic exercise;Patient/family education;Neuromuscular re-education;Manual techniques;Passive range of motion;Dry needling;Spinal Manipulations;Taping    PT Next Visit Plan  monitor vitals as needed, continue exercise progression as tolerated    PT Home Exercise Plan  move during TV commercials, step ups, partial squat, TrA, marching, bridge with ball, clam & reverse, anchored bands on physioball, primal push ups, heel raises, slow marching, wall squats    Consulted and Agree with Plan of Care  Patient       Patient will benefit from skilled therapeutic intervention in order to improve the following deficits and impairments:  Difficulty walking, Decreased endurance, Decreased activity tolerance, Pain, Improper body mechanics, Decreased balance, Decreased strength, Postural dysfunction  Visit Diagnosis: Difficulty in walking, not elsewhere classified  Muscle weakness (generalized)     Problem List Patient Active Problem List   Diagnosis Date Noted  . TOA (tubo-ovarian abscess) 07/24/2019  . Tubo-ovarian abscess 07/20/2019  . Abdominal pain 07/20/2019  . Physical deconditioning 05/14/2019  . PID (pelvic inflammatory disease) 05/14/2019  . Small bowel  obstruction ([place])   . Pelvic  mass 05/02/2019  . Elevated glucose 11/29/2018  . Class 1 obesity with serious comorbidity and body mass index (BMI) of 30.0 to 30.9 in adult 11/29/2018   Tashika Goodin C. Irem Stoneham PT, DPT 09/20/19 5:19 PM   [facility] [address] [place], [place], [address] Phone: [phone]   Fax:  [phone]  Name: Gwendolyn Nguyen MRN: [id number] Date of Birth: 07-10-1986

## 2019-09-22 ENCOUNTER — Encounter: Payer: Self-pay | Admitting: Physical Therapy

## 2019-09-22 ENCOUNTER — Ambulatory Visit: Payer: BC Managed Care – PPO | Admitting: Physical Therapy

## 2019-09-22 ENCOUNTER — Other Ambulatory Visit: Payer: Self-pay

## 2019-09-22 VITALS — BP 141/100 | HR 105

## 2019-09-22 DIAGNOSIS — R262 Difficulty in walking, not elsewhere classified: Secondary | ICD-10-CM | POA: Diagnosis not present

## 2019-09-22 DIAGNOSIS — M6281 Muscle weakness (generalized): Secondary | ICD-10-CM

## 2019-09-22 NOTE — Therapy (Signed)
[place], [place], [address] Phone: [phone]   Fax:  [phone]  Physical Therapy Treatment  Patient Details  Name: Gwendolyn Nguyen MRN: [id number] Date of Birth: 1985-11-06 Referring Provider (PT): Forrest Moron MD   Encounter Date: 09/22/2019  PT End of Session - 09/22/19 1626    Visit Number  16    Number of Visits  34    Date for PT Re-Evaluation  11/18/19    Authorization Type  BCBS    PT Start Time  1625    PT Stop Time  1700    PT Time Calculation (min)  35 min    Activity Tolerance  Patient tolerated treatment well    Behavior During Therapy  [facility] for tasks assessed/performed       Past Medical History:  Diagnosis Date  . Anemia   . Back pain   . Constipation   . Endometriosis   . IBS (irritable bowel syndrome)   . Low hemoglobin   . Multiple food allergies   . PID (acute pelvic inflammatory disease) 99991111   complicated by tuboovarian abscess    Past Surgical History:  Procedure Laterality Date  . IR RADIOLOGIST EVAL & MGMT  05/19/2019  . IR RADIOLOGIST EVAL & MGMT  05/24/2019  . LAPAROSCOPIC LYSIS OF ADHESIONS N/A 07/24/2019   Procedure: Laparoscopic Lysis Of Adhesions, Drainage of Pelvic Abscess;  Surgeon: Servando Salina, MD;  Location: [place];  Service: Gynecology;  Laterality: N/A;  . LAPAROSCOPIC LYSIS OF ADHESIONS N/A 07/24/2019   Procedure: Laparoscopic Lysis Of Adhesions and Assist With Drainage of Pelvic Abscess;  Surgeon: Georganna Skeans, MD;  Location: [place];  Service: General;  Laterality: N/A;  . LAPAROSCOPY N/A 07/24/2019   Procedure: LAPAROSCOPY DIAGNOSTIC;  Surgeon: Servando Salina, MD;  Location: [place];  Service: Gynecology;  Laterality: N/A;    Vitals:   09/22/19 1654  BP: (!) 141/100  Pulse: (!) 105    Subjective Assessment - 09/22/19 1636    Subjective  Stomach is bothering me a little today.    Patient Stated Goals  sleep, workout, daily  activities    Currently in Pain?  Yes    Pain Location  Abdomen    Pain Orientation  Lower;Mid;Right;Left    Pain Descriptors / Indicators  Sharp                       [facility] Adult PT Treatment/Exercise - 09/22/19 0001      Lumbar Exercises: Standing   Other Standing Lumbar Exercises  body blade standing on airex 3x1 min each hand    Other Standing Lumbar Exercises  step up & back 2" step with airex on top, side stepping over               PT Short Term Goals - 09/12/19 1433      PT SHORT TERM GOAL #1   Title  Pt will demo at least 1200 ft for 6MWT    Baseline  760 feet    Time  5    Period  Weeks    Status  On-going      PT SHORT TERM GOAL #2   Title  able to demo SLS at least 5s each side    Baseline  20 sec bilat.    Time  5    Period  Weeks    Status  Achieved      PT SHORT TERM GOAL #  3   Title  Pt will demo good form with sit<>stand    Baseline  wide stance with hinge forward to shift weight    Time  5    Period  Weeks    Status  On-going    Target Date  09/29/19        PT Long Term Goals - 09/12/19 1435      PT LONG TERM GOAL #1   Title  pt will be able to navigate her stairs step over step wihtout difficulty    Baseline  unable to perform with reciprocal gait    Time  10    Period  Weeks    Status  On-going      PT LONG TERM GOAL #2   Title  6MWT at least 2000 ft    Baseline  760 feet    Time  10    Period  Weeks    Status  On-going      PT LONG TERM GOAL #3   Title  5TSTS <10s    Baseline  23 seconds    Time  10    Period  Weeks    Status  On-going      PT LONG TERM GOAL #4   Title  Pt will return to challenging HEP that will be carried on to long term exercise program    Baseline  unable    Time  10    Period  Weeks    Status  On-going      PT LONG TERM GOAL #5   Title  pt will be able to complete all household chores independently without limitaiton by fatigue or need for family member assistance    Baseline   still limited/needs intermittent assist from family    Time  10    Period  Weeks    Status  On-going            Plan - 09/22/19 1700    Clinical Impression Statement  Ended treatment when we did due to increase in BP and HR (141/100, 105 BPM) that is not consistent with age appropriate reaction to the level of exercises being performed. She denid symptoms of dizziness, nausea etc but I do not feel comfortable pushing it. I do think she needs to consult a cardiologist as these BP changes are new to her with onset of abdominal pains.    PT Treatment/Interventions  ADLs/Self Care Home Management;Cryotherapy;Electrical Stimulation;Ultrasound;Moist Heat;Iontophoresis 4mg /ml Dexamethasone;Traction;Gait training;Stair training;Functional mobility training;Therapeutic activities;Balance training;Therapeutic exercise;Patient/family education;Neuromuscular re-education;Manual techniques;Passive range of motion;Dry needling;Spinal Manipulations;Taping    PT Next Visit Plan  increase step height, monitor vitals.    PT Home Exercise Plan  move during TV commercials, step ups, partial squat, TrA, marching, bridge with ball, clam & reverse, anchored bands on physioball, primal push ups, heel raises, slow marching, wall squats    Consulted and Agree with Plan of Care  Patient       Patient will benefit from skilled therapeutic intervention in order to improve the following deficits and impairments:  Difficulty walking, Decreased endurance, Decreased activity tolerance, Pain, Improper body mechanics, Decreased balance, Decreased strength, Postural dysfunction  Visit Diagnosis: Difficulty in walking, not elsewhere classified  Muscle weakness (generalized)     Problem List Patient Active Problem List   Diagnosis Date Noted  . TOA (tubo-ovarian abscess) 07/24/2019  . Tubo-ovarian abscess 07/20/2019  . Abdominal pain 07/20/2019  . Physical deconditioning 05/14/2019  . PID (pelvic inflammatory  disease) 05/14/2019  .  Small bowel obstruction ([place])   . Pelvic mass 05/02/2019  . Elevated glucose 11/29/2018  . Class 1 obesity with serious comorbidity and body mass index (BMI) of 30.0 to 30.9 in adult 11/29/2018    Ludwig Tugwell C. Keilyn Nadal PT, DPT 09/22/19 5:03 PM   [place], [place], [address] Phone: [phone]   Fax:  [phone]  Name: Gwendolyn Nguyen MRN: [id number] Date of Birth: 09-23-1986

## 2019-09-23 ENCOUNTER — Ambulatory Visit: Payer: BC Managed Care – PPO | Admitting: Registered Nurse

## 2019-09-23 ENCOUNTER — Encounter: Payer: Self-pay | Admitting: Registered Nurse

## 2019-09-23 ENCOUNTER — Other Ambulatory Visit: Payer: Self-pay

## 2019-09-23 VITALS — BP 128/88 | HR 93 | Temp 98.0°F | Wt 159.6 lb

## 2019-09-23 DIAGNOSIS — R030 Elevated blood-pressure reading, without diagnosis of hypertension: Secondary | ICD-10-CM | POA: Diagnosis not present

## 2019-09-23 DIAGNOSIS — Z862 Personal history of diseases of the blood and blood-forming organs and certain disorders involving the immune mechanism: Secondary | ICD-10-CM | POA: Diagnosis not present

## 2019-09-23 LAB — POCT CBC
Granulocyte percent: 66 %G (ref 37–80)
HCT, POC: 34.5 % (ref 29–41)
Hemoglobin: 11.3 g/dL (ref 11–14.6)
Lymph, poc: 2.1 (ref 0.6–3.4)
MCH, POC: 27.5 pg (ref 27–31.2)
MCHC: 32.9 g/dL (ref 31.8–35.4)
MCV: 84.4 fL (ref 76–111)
MID (cbc): 0.5 (ref 0–0.9)
MPV: 6.8 fL (ref 0–99.8)
POC Granulocyte: 5 (ref 2–6.9)
POC LYMPH PERCENT: 27.1 %L (ref 10–50)
POC MID %: 6.9 %M (ref 0–12)
Platelet Count, POC: 480 10*3/uL — AB (ref 142–424)
RBC: 4.13 M/uL (ref 4.04–5.48)
RDW, POC: 15.2 %
WBC: 7.6 10*3/uL (ref 4.6–10.2)

## 2019-09-23 NOTE — Patient Instructions (Signed)
° ° ° °  If you have lab work done today you will be contacted with your lab results within the next 2 weeks.  If you have not heard from us then please contact us. The fastest way to get your results is to register for My Chart. ° ° °IF you received an x-ray today, you will receive an invoice from [facility]. Please contact  Radiology at [phone] with questions or concerns regarding your invoice.  ° °IF you received labwork today, you will receive an invoice from [facility]. Please contact [facility] at [phone] with questions or concerns regarding your invoice.  ° °Our billing staff will not be able to assist you with questions regarding bills from these companies. ° °You will be contacted with the lab results as soon as they are available. The fastest way to get your results is to activate your My Chart account. Instructions are located on the last page of this paperwork. If you have not heard from us regarding the results in 2 weeks, please contact this office. °  ° ° ° °

## 2019-09-25 ENCOUNTER — Encounter: Payer: Self-pay | Admitting: Registered Nurse

## 2019-09-25 NOTE — Progress Notes (Signed)
Established Patient Office Visit  Subjective:  Patient ID: Gwendolyn Nguyen, female    DOB: [birth year]  Age: 33 y.o. MRN: [id number]  CC:  Chief Complaint  Patient presents with  . elevated blood pressure    Patient stated bp has been elevated since she came out the hospital in Sept-Oct 16. When got out the hospital was having sob and sent me to see Pulmonary Dr Carlis Abbott. Has been in PE and they stated they needed me to be seen by pcp since bp keeps being elevated. Have an up coming appt with Pulm on 09/26/19 To talkl more about the sob.     HPI Gwendolyn Nguyen presents for BP concerns  Reports that she has been going to PT and at her recent visits, she has been asked to leave due to elevated pulse and blood pressure. Not sure what is causing this. States pulse over 100 and BP of 150/100. Denies CV symptoms including chest pain, headache, visual changes, dependent edema.  Was hospitalized 6 weeks ago for acute PID. Notably elevated WBCs due to infection, also noted anemia on those studies with Hgb in 8s.   Otherwise, no history of this happening before.  Past Medical History:  Diagnosis Date  . Anemia   . Back pain   . Constipation   . Endometriosis   . IBS (irritable bowel syndrome)   . Low hemoglobin   . Multiple food allergies   . PID (acute pelvic inflammatory disease) [id number]   complicated by tuboovarian abscess    Past Surgical History:  Procedure Laterality Date  . IR RADIOLOGIST EVAL & MGMT  05/19/2019  . IR RADIOLOGIST EVAL & MGMT  05/24/2019  . LAPAROSCOPIC LYSIS OF ADHESIONS N/A 07/24/2019   Procedure: Laparoscopic Lysis Of Adhesions, Drainage of Pelvic Abscess;  Surgeon: Servando Salina, MD;  Location: [place];  Service: Gynecology;  Laterality: N/A;  . LAPAROSCOPIC LYSIS OF ADHESIONS N/A 07/24/2019   Procedure: Laparoscopic Lysis Of Adhesions and Assist With Drainage of Pelvic Abscess;  Surgeon: Georganna Skeans, MD;  Location: [place];  Service: General;  Laterality: N/A;   . LAPAROSCOPY N/A 07/24/2019   Procedure: LAPAROSCOPY DIAGNOSTIC;  Surgeon: Servando Salina, MD;  Location: [place];  Service: Gynecology;  Laterality: N/A;    Family History  Problem Relation Age of Onset  . Hyperlipidemia Mother   . Hypertension Mother   . Obesity Mother   . Hyperlipidemia Father   . Hypertension Father   . Obesity Father   . Diabetes Maternal Grandmother     Social History   Socioeconomic History  . Marital status: Single    Spouse name: Not on file  . Number of children: Not on file  . Years of education: Not on file  . Highest education level: Not on file  Occupational History  . Occupation: Social worker  Social Needs  . Financial resource strain: Not on file  . Food insecurity    Worry: Not on file    Inability: Not on file  . Transportation needs    Medical: Not on file    Non-medical: Not on file  Tobacco Use  . Smoking status: Never Smoker  . Smokeless tobacco: Never Used  Substance and Sexual Activity  . Alcohol use: Yes    Comment: occassional  . Drug use: Never  . Sexual activity: Not on file  Lifestyle  . Physical activity    Days per week: Not on file    Minutes per session: Not on file  .  Stress: Not on file  Relationships  . Social Herbalist on phone: Not on file    Gets together: Not on file    Attends religious service: Not on file    Active member of club or organization: Not on file    Attends meetings of clubs or organizations: Not on file    Relationship status: Not on file  . Intimate partner violence    Fear of current or ex partner: Not on file    Emotionally abused: Not on file    Physically abused: Not on file    Forced sexual activity: Not on file  Other Topics Concern  . Not on file  Social History Narrative  . Not on file    Outpatient Medications Prior to Visit  Medication Sig Dispense Refill  . clonazePAM (KLONOPIN) 0.5 MG tablet Take 1 tablet (0.5 mg total) by mouth at bedtime. For GI  symptoms. 30 tablet 1  . ferrous sulfate 325 (65 FE) MG tablet Take 325 mg by mouth daily with breakfast. FUSION PLUS 130 mg iron -1,250 mcg cap    . medroxyPROGESTERone (DEPO-PROVERA) 150 MG/ML injection Inject 150 mg into the muscle every 3 (three) months.    . Mefenamic Acid 250 MG CAPS Take by mouth.    . methocarbamol (ROBAXIN) 500 MG tablet Take 500 mg by mouth 4 (four) times daily.     No facility-administered medications prior to visit.     Allergies  Allergen Reactions  . Gadolinium Derivatives Hives, Itching and Nausea And Vomiting    Pt vomited immediately during injection.  15 minutes later, pt developed hives and itching.   . Shellfish Allergy Hives  . Sulfa Antibiotics Other (See Comments)    Patient was told to NOT TAKE THIS  . Iohexol Hives and Rash    08-2018, rash and hives, needs pre meds S/W PATIENT STATED REACTION FROM CT CONTRAST 11/19    ROS Review of Systems  Constitutional: Negative.   HENT: Negative.   Eyes: Negative.  Negative for visual disturbance.  Respiratory: Negative.  Negative for cough and shortness of breath.   Cardiovascular: Negative.  Negative for chest pain, palpitations and leg swelling.  Gastrointestinal: Negative.  Negative for abdominal pain.  Endocrine: Negative.   Genitourinary: Negative.   Musculoskeletal: Negative.   Skin: Negative.   Allergic/Immunologic: Negative.   Neurological: Negative.  Negative for headaches.  Hematological: Negative.   Psychiatric/Behavioral: Negative.   All other systems reviewed and are negative.     Objective:    Physical Exam  Constitutional: She is oriented to person, place, and time. She appears well-developed and well-nourished. No distress.  Cardiovascular: Normal rate and regular rhythm.  Pulmonary/Chest: Effort normal and breath sounds normal. No respiratory distress.  Neurological: She is alert and oriented to person, place, and time. No cranial nerve deficit.  Skin: Skin is warm and  dry. No rash noted. She is not diaphoretic. No erythema. No pallor.  Psychiatric: She has a normal mood and affect. Her behavior is normal. Judgment and thought content normal.  Nursing note and vitals reviewed.   BP 128/88 (BP Location: Right Arm, Patient Position: Sitting, Cuff Size: Normal)   Pulse 93   Temp 98 F (36.7 C) (Temporal)   Wt 159 lb 9.6 oz (72.4 kg)   SpO2 100%   BMI 27.40 kg/m  Wt Readings from Last 3 Encounters:  09/23/19 159 lb 9.6 oz (72.4 kg)  08/19/19 164 lb (74.4 kg)  08/09/19  165 lb (74.8 kg)     Health Maintenance Due  Topic Date Due  . PAP SMEAR-Modifier  02/25/2009    There are no preventive care reminders to display for this patient.  Lab Results  Component Value Date   TSH 1.160 11/25/2018   Lab Results  Component Value Date   WBC 7.6 09/23/2019   HGB 11.3 09/23/2019   HCT 34.5 09/23/2019   MCV 84.4 09/23/2019   PLT 391 08/01/2019   Lab Results  Component Value Date   NA 128 (L) 08/03/2019   K 3.8 08/03/2019   CO2 21 (L) 08/03/2019   GLUCOSE 453 (H) 08/03/2019   BUN 10 08/03/2019   CREATININE 1.07 (H) 08/03/2019   BILITOT 0.4 07/20/2019   ALKPHOS 50 07/20/2019   AST 32 07/20/2019   ALT 40 07/20/2019   PROT 7.4 07/20/2019   ALBUMIN 3.8 07/20/2019   CALCIUM 8.1 (L) 08/03/2019   ANIONGAP 13 08/03/2019   GFR 78.11 06/09/2019   Lab Results  Component Value Date   CHOL 150 11/25/2018   Lab Results  Component Value Date   HDL 56 11/25/2018   Lab Results  Component Value Date   LDLCALC 86 11/25/2018   Lab Results  Component Value Date   TRIG 41 11/25/2018   No results found for: CHOLHDL No results found for: HGBA1C    Assessment & Plan:   Problem List Items Addressed This Visit    None    Visit Diagnoses    History of anemia    -  Primary   Relevant Orders   POCT CBC (Completed)   Iron, TIBC and Ferritin Panel (Completed)   Vitamin B12 (Completed)   Vitamin D, 25-hydroxy (Completed)   Folate (Completed)    Elevated BP without diagnosis of hypertension          No orders of the defined types were placed in this encounter.   Follow-up: No follow-ups on file.   PLAN  Will draw labs, follow up as warranted. Pulse may be compensatory if anemia is worsening.   Will consider referral to cardiology if clear etiology is not determined  No concerning findings on exam today  Reviewed ED precautions  Patient encouraged to call clinic with any questions, comments, or concerns.   Maximiano Coss, NP

## 2019-09-26 ENCOUNTER — Other Ambulatory Visit: Payer: Self-pay

## 2019-09-26 ENCOUNTER — Encounter: Payer: Self-pay | Admitting: Registered Nurse

## 2019-09-26 ENCOUNTER — Encounter: Payer: Self-pay | Admitting: Critical Care Medicine

## 2019-09-26 ENCOUNTER — Ambulatory Visit (INDEPENDENT_AMBULATORY_CARE_PROVIDER_SITE_OTHER): Payer: BC Managed Care – PPO | Admitting: Critical Care Medicine

## 2019-09-26 VITALS — BP 130/82 | HR 82 | Temp 97.0°F | Ht 64.0 in | Wt 161.8 lb

## 2019-09-26 DIAGNOSIS — M549 Dorsalgia, unspecified: Secondary | ICD-10-CM

## 2019-09-26 LAB — VITAMIN B12: Vitamin B-12: 580 pg/mL (ref 232–1245)

## 2019-09-26 LAB — IRON,TIBC AND FERRITIN PANEL
Ferritin: 427 ng/mL — ABNORMAL HIGH (ref 15–150)
Iron Saturation: 32 % (ref 15–55)
Iron: 78 ug/dL (ref 27–159)
Total Iron Binding Capacity: 247 ug/dL — ABNORMAL LOW (ref 250–450)
UIBC: 169 ug/dL (ref 131–425)

## 2019-09-26 LAB — VITAMIN D 25 HYDROXY (VIT D DEFICIENCY, FRACTURES): Vit D, 25-Hydroxy: 28.5 ng/mL — ABNORMAL LOW (ref 30.0–100.0)

## 2019-09-26 LAB — FOLATE: Folate: 8.7 ng/mL (ref >3.0)

## 2019-09-26 NOTE — Progress Notes (Signed)
Synopsis: Referred in August 2020 for evaluation of dyspnea by Dr. Garwin Brothers.  Subjective:   PATIENT ID: Gwendolyn Nguyen GENDER: female DOB: [birth year], MRN: [id number]    Gwendolyn Nguyen presents for follow up of persistent intermittent right-sided posterior upper chest pain.  This is most noticeable when she is breathing hard at physical therapy or when sneezing.  This has been ongoing since she was hospitalized in July.  Physical therapy had referred her back to her primary care physician, who has referred her back to pulmonology.  She sometimes still has shortness of breath when walking and when at physical therapy.  Physical therapy was prescribed for reconditioning.  She denies a history of rashes on her chest other than a mild rash under her breast which resolved and was never pustular, no rigidity extend around to her back.  She denies wheezing, cough.  Until her health is back to baseline she does not want to get a flu shot because she feels like she gets sick from them.    Televisit 06/29/2019 Today her chest discomfort is improved, but persists on the right side of her chest. Now it is lower on her right side, not radiating above the breast. Last week she underwent a CT scan of her chest to rule out pleural disease or PE to explain her pain. Due to concern for allergic reaction (she has had a reaction to MRI contrast), she was premedicated with prednisone and H2-blocker. Since her CT, she has been sick, started that evening. She complains of belching, reflux, and abdominal pain over her ovaries. She has blood in her stool today and 2 days ago. She denies fever, but feels "off". She also endorses pain with urination. Overall she felt similar when she had PID. She has no history of intolerance to NSAIDs and has not been taking them recently. She an appointment with her PCP this morning and now has appointment with GI tomorrow for these new symptoms. No rashes, runny nose, or sore throat. She is  concerned that she still has symptoms and does not want to return to work until this is resolved.  OV 06/29/2019: Gwendolyn Nguyen is a 33 year old woman referred for evaluation of an unusual painful sensation in her right chest for 6 months.  Her past medical history is notable for small bowel obstruction and tubo-ovarian abscess in July 2020 related to PID, endometriosis, and chronic anemia.  She underwent percutaneous drainage of the pelvic abscess on 05/10/2019 with the drain removed on 05/24/2019.  She was treated with a course of metronidazole as an outpatient.   Her chest discomfort is described it as sharp pain when sneezing, but has a pulling sensation with normal breathing.  This began during her week of vomiting prior to presenting to the hospital with her bowel obstruction.  At that time she attributed it to pulling a muscle, but is concerned that it is persisted.  Over time it has not significantly changed, and she has never had this before.  She denies a personal and family history of blood clots, lung disease, rheumatologic disease, and sickle cell disease. She does not smoke or vape.  She has been anemic since starting menstruating as a teenager, and she denies anemia or exercise intolerance as a child.  She is never had a hemoglobin electrophoresis performed.  Since she had an NG tube placed while hospitalized she has had hoarseness that has slowly been improving.  Her voice is now stronger, but goes "out" after talking for a while.  She does not report that the insertion of the NG tube was difficult or traumatic.  She has not undergone any other instrumentation of her upper airway.      Past Medical History:  Diagnosis Date   Anemia    Back pain    Constipation    Endometriosis    IBS (irritable bowel syndrome)    Low hemoglobin    Multiple food allergies    PID (acute pelvic inflammatory disease) 99991111   complicated by tuboovarian abscess     Family History  Problem  Relation Age of Onset   Hyperlipidemia Mother    Hypertension Mother    Obesity Mother    Hyperlipidemia Father    Hypertension Father    Obesity Father    Diabetes Maternal Grandmother      Past Surgical History:  Procedure Laterality Date   IR RADIOLOGIST EVAL & MGMT  05/19/2019   IR RADIOLOGIST EVAL & MGMT  05/24/2019   LAPAROSCOPIC LYSIS OF ADHESIONS N/A 07/24/2019   Procedure: Laparoscopic Lysis Of Adhesions, Drainage of Pelvic Abscess;  Surgeon: Servando Salina, MD;  Location: [place];  Service: Gynecology;  Laterality: N/A;   LAPAROSCOPIC LYSIS OF ADHESIONS N/A 07/24/2019   Procedure: Laparoscopic Lysis Of Adhesions and Assist With Drainage of Pelvic Abscess;  Surgeon: Georganna Skeans, MD;  Location: [place];  Service: General;  Laterality: N/A;   LAPAROSCOPY N/A 07/24/2019   Procedure: LAPAROSCOPY DIAGNOSTIC;  Surgeon: Servando Salina, MD;  Location: [place];  Service: Gynecology;  Laterality: N/A;    Social History   Socioeconomic History   Marital status: Single    Spouse name: Not on file   Number of children: Not on file   Years of education: Not on file   Highest education level: Not on file  Occupational History   Occupation: Social worker  Social Needs   Financial resource strain: Not on file   Food insecurity    Worry: Not on file    Inability: Not on file   Transportation needs    Medical: Not on file    Non-medical: Not on file  Tobacco Use   Smoking status: Never Smoker   Smokeless tobacco: Never Used  Substance and Sexual Activity   Alcohol use: Yes    Comment: occassional   Drug use: Never   Sexual activity: Not on file  Lifestyle   Physical activity    Days per week: Not on file    Minutes per session: Not on file   Stress: Not on file  Relationships   Social connections    Talks on phone: Not on file    Gets together: Not on file    Attends religious service: Not on file    Active member of club or organization: Not  on file    Attends meetings of clubs or organizations: Not on file    Relationship status: Not on file   Intimate partner violence    Fear of current or ex partner: Not on file    Emotionally abused: Not on file    Physically abused: Not on file    Forced sexual activity: Not on file  Other Topics Concern   Not on file  Social History Narrative   Not on file     Allergies  Allergen Reactions   Gadolinium Derivatives Hives, Itching and Nausea And Vomiting    Pt vomited immediately during injection.  15 minutes later, pt developed hives and itching.    Shellfish Allergy Hives  Sulfa Antibiotics Other (See Comments)    Patient was told to NOT TAKE THIS   Iohexol Hives and Rash    08-2018, rash and hives, needs pre meds S/W PATIENT STATED REACTION FROM CT CONTRAST 11/19      There is no immunization history on file for this patient.  Outpatient Medications Prior to Visit  Medication Sig Dispense Refill   clonazePAM (KLONOPIN) 0.5 MG tablet Take 1 tablet (0.5 mg total) by mouth at bedtime. For GI symptoms. 30 tablet 1   ferrous sulfate 325 (65 FE) MG tablet Take 325 mg by mouth daily with breakfast. FUSION PLUS 130 mg iron -1,250 mcg cap     medroxyPROGESTERone (DEPO-PROVERA) 150 MG/ML injection Inject 150 mg into the muscle every 3 (three) months.     No facility-administered medications prior to visit.     Review of Systems  Constitutional: Negative for chills and fever.  HENT: Negative for congestion and sore throat.   Respiratory: Negative for cough and shortness of breath.   Cardiovascular: Positive for chest pain. Negative for leg swelling.  Gastrointestinal: Negative for abdominal pain, blood in stool, heartburn and nausea.  Genitourinary: Negative for dysuria.  Musculoskeletal: Negative for joint pain and myalgias.  Skin: Negative for rash.  Neurological: Positive for headaches. Negative for weakness.     Objective:   Vitals:   09/26/19 1117  BP:  130/82  Pulse: 82  Temp: (!) 97 F (36.1 C)  TempSrc: Temporal  SpO2: 99%  Weight: 161 lb 12.8 oz (73.4 kg)  Height: 5\' 4"  (1.626 m)   99%  BMI Readings from Last 3 Encounters:  09/26/19 27.77 kg/m  09/23/19 27.40 kg/m  08/19/19 28.15 kg/m   Wt Readings from Last 3 Encounters:  09/26/19 161 lb 12.8 oz (73.4 kg)  09/23/19 159 lb 9.6 oz (72.4 kg)  08/19/19 164 lb (74.4 kg)    Physical Exam Vitals signs reviewed.  Constitutional:      General: She is not in acute distress.    Appearance: She is not ill-appearing.  HENT:     Head: Normocephalic and atraumatic.     Nose:     Comments: Deferred due to masking requirement.    Mouth/Throat:     Comments: Deferred due to masking requirement. Eyes:     General: No scleral icterus. Neck:     Musculoskeletal: Neck supple.  Cardiovascular:     Rate and Rhythm: Normal rate and regular rhythm.     Heart sounds: No murmur.  Pulmonary:     Comments: Breathing comfortably on room air, no conversational dyspnea.  CTAB. Abdominal:     General: There is no distension.     Tenderness: There is no abdominal tenderness.  Musculoskeletal:        General: No swelling or deformity.     Comments: Right paraspinal increased muscle tonicity in mid thorax, prominent posterior rib heads.  Lymphadenopathy:     Cervical: No cervical adenopathy.  Skin:    General: Skin is warm and dry.     Findings: No rash.  Neurological:     General: No focal deficit present.     Mental Status: She is alert.     Motor: No weakness.     Coordination: Coordination normal.  Psychiatric:        Mood and Affect: Mood normal.        Behavior: Behavior normal.    Unable to be performed on telephone call  CBC    Component Value  Date/Time   WBC 7.6 09/23/2019 1152   WBC 15.8 (H) 08/01/2019 0303   RBC 4.13 09/23/2019 1152   RBC 3.07 (L) 08/01/2019 0303   HGB 11.3 09/23/2019 1152   HGB 8.8 (L) 08/01/2019 0303   HCT 34.5 09/23/2019 1152   HCT 27.6 (L)  08/01/2019 0303   PLT 391 08/01/2019 0303   MCV 84.4 09/23/2019 1152   MCH 27.5 09/23/2019 1152   MCH 28.7 08/01/2019 0303   MCHC 32.9 09/23/2019 1152   MCHC 31.9 08/01/2019 0303   RDW 15.9 (H) 08/01/2019 0303   LYMPHSABS 1.6 07/27/2019 0851   MONOABS 0.7 07/27/2019 0851   EOSABS 0.2 07/27/2019 0851   BASOSABS 0.0 07/27/2019 0851   Hemoglobin A, A2, fetal hemoglobin WNL.Normal phenotype.  Iron 91 Ferritin 512 transferrin 210, saturation ratio 31% BMP within normal limits  Chest Imaging- films reviewed: CT chest 06/24/2019- no pulmonary embolus.  Linear scarring with surrounding mild groundglass opacities in the right middle lobe and superior segment of right lower lobe.  Normal left lung.  No pleural thickening or effusions.  No obvious rib or musculoskeletal abnormalities.  Pulmonary Functions Testing Results: No flowsheet data found.      Assessment & Plan:     ICD-10-CM   1. Musculoskeletal back pain  M54.9    Atypical chest pain not explained by chest CT- no pulmonary embolus, pleural inflammation, or pleural effusion.  I suspect this is from musculoskeletal causes.  -Recommend follow up with PT regarding MSK pain- they often are very helpful with resolving these types of issues.  -Outpatient pain management consult would be helpful for evaluation of trigger point injections. -Recommend NSAIDs or Tylenol for pain management. Heat packs may help.  Follow- up PRN.     Current Outpatient Medications:    clonazePAM (KLONOPIN) 0.5 MG tablet, Take 1 tablet (0.5 mg total) by mouth at bedtime. For GI symptoms., Disp: 30 tablet, Rfl: 1   ferrous sulfate 325 (65 FE) MG tablet, Take 325 mg by mouth daily with breakfast. FUSION PLUS 130 mg iron -1,250 mcg cap, Disp: , Rfl:    medroxyPROGESTERone (DEPO-PROVERA) 150 MG/ML injection, Inject 150 mg into the muscle every 3 (three) months., Disp: , Rfl:      Julian Hy, DO [facility] 09/26/2019 1:31 PM

## 2019-09-26 NOTE — Telephone Encounter (Signed)
This is actually good timing on her part - her labs just returned as well. There's nothing to suggest a cause for her reason for visit on 12/4, and with this additional symptom, I think she would be best served with a cardiology referral. If you wouldn't mind giving her a call to let her know I'm going to place that referral, that would be great.  Thank you!!  Kathrin Ruddy, NP

## 2019-09-26 NOTE — Patient Instructions (Addendum)
Thank you for visiting Dr. Carlis Abbott at [facility].  We recommend the following:  Follow up with PT about musculoskeletal rib pain. Ask PCP about pain management.   Return if symptoms worsen or fail to improve.    Please do your part to reduce the spread of COVID-19.

## 2019-09-27 ENCOUNTER — Ambulatory Visit: Payer: BC Managed Care – PPO | Admitting: Physical Therapy

## 2019-09-27 ENCOUNTER — Encounter: Payer: Self-pay | Admitting: Dietician

## 2019-09-27 ENCOUNTER — Ambulatory Visit: Payer: BC Managed Care – PPO | Admitting: Registered"

## 2019-09-27 ENCOUNTER — Encounter: Payer: BC Managed Care – PPO | Attending: Family Medicine | Admitting: Dietician

## 2019-09-27 ENCOUNTER — Encounter: Payer: Self-pay | Admitting: *Deleted

## 2019-09-27 ENCOUNTER — Encounter: Payer: Self-pay | Admitting: Physical Therapy

## 2019-09-27 VITALS — BP 156/105

## 2019-09-27 DIAGNOSIS — R262 Difficulty in walking, not elsewhere classified: Secondary | ICD-10-CM

## 2019-09-27 DIAGNOSIS — R1030 Lower abdominal pain, unspecified: Secondary | ICD-10-CM | POA: Diagnosis not present

## 2019-09-27 DIAGNOSIS — M6281 Muscle weakness (generalized): Secondary | ICD-10-CM

## 2019-09-27 NOTE — Therapy (Signed)
[place], [place], [address] Phone: [phone]   Fax:  [phone]  Physical Therapy Treatment  Patient Details  Name: Gwendolyn Nguyen MRN: [id number] Date of Birth: September 28, 1986 Referring Provider (PT): Forrest Moron MD   Encounter Date: 09/27/2019  PT End of Session - 09/27/19 1638    Visit Number  17    Number of Visits  34    Date for PT Re-Evaluation  11/18/19    Authorization Type  BCBS    PT Start Time  1631    PT Stop Time  1713    PT Time Calculation (min)  42 min    Activity Tolerance  Patient tolerated treatment well    Behavior During Therapy  [facility] for tasks assessed/performed       Past Medical History:  Diagnosis Date  . Anemia   . Back pain   . Constipation   . Endometriosis   . IBS (irritable bowel syndrome)   . Low hemoglobin   . Multiple food allergies   . PID (acute pelvic inflammatory disease) [id number]   complicated by tuboovarian abscess    Past Surgical History:  Procedure Laterality Date  . IR RADIOLOGIST EVAL & MGMT  05/19/2019  . IR RADIOLOGIST EVAL & MGMT  05/24/2019  . LAPAROSCOPIC LYSIS OF ADHESIONS N/A 07/24/2019   Procedure: Laparoscopic Lysis Of Adhesions, Drainage of Pelvic Abscess;  Surgeon: Servando Salina, MD;  Location: [place];  Service: Gynecology;  Laterality: N/A;  . LAPAROSCOPIC LYSIS OF ADHESIONS N/A 07/24/2019   Procedure: Laparoscopic Lysis Of Adhesions and Assist With Drainage of Pelvic Abscess;  Surgeon: Georganna Skeans, MD;  Location: [place];  Service: General;  Laterality: N/A;  . LAPAROSCOPY N/A 07/24/2019   Procedure: LAPAROSCOPY DIAGNOSTIC;  Surgeon: Servando Salina, MD;  Location: [place];  Service: Gynecology;  Laterality: N/A;    Vitals:   09/27/19 1651  BP: (!) 156/105    Subjective Assessment - 09/27/19 1636    Subjective  Going to eat smaller, more frequent meals from nutrition appointment. Pulmonologist says pain in my lung is  actually MSK & would like PT to address. PCP thinks high BP is due to anemia- trying home remedy using cayenne pepper.    Currently in Pain?  Yes    Pain Score  2     Pain Location  Abdomen    Pain Orientation  Lower         [facility] PT Assessment - 09/27/19 0001      Assessment   Medical Diagnosis  physical deconditioning    Referring Provider (PT)  Forrest Moron MD                   [facility] Adult PT Treatment/Exercise - 09/27/19 0001      Pilates   Pilates Reformer  foot work 1red 1blue; bridges [place]      Manual Therapy   Manual Therapy  Soft tissue mobilization    Manual therapy comments  edu on use of theracane    Soft tissue mobilization  Rt periscapular region             PT Education - 09/27/19 1718    Education Details  BP, POC moving forward    Person(s) Educated  Patient    Methods  Explanation    Comprehension  Verbalized understanding;Need further instruction       PT Short Term Goals - 09/12/19 1433  PT SHORT TERM GOAL #1   Title  Pt will demo at least 1200 ft for 6MWT    Baseline  760 feet    Time  5    Period  Weeks    Status  On-going      PT SHORT TERM GOAL #2   Title  able to demo SLS at least 5s each side    Baseline  20 sec bilat.    Time  5    Period  Weeks    Status  Achieved      PT SHORT TERM GOAL #3   Title  Pt will demo good form with sit<>stand    Baseline  wide stance with hinge forward to shift weight    Time  5    Period  Weeks    Status  On-going    Target Date  09/29/19        PT Long Term Goals - 09/12/19 1435      PT LONG TERM GOAL #1   Title  pt will be able to navigate her stairs step over step wihtout difficulty    Baseline  unable to perform with reciprocal gait    Time  10    Period  Weeks    Status  On-going      PT LONG TERM GOAL #2   Title  6MWT at least 2000 ft    Baseline  760 feet    Time  10    Period  Weeks    Status  On-going      PT LONG TERM GOAL #3   Title   5TSTS <10s    Baseline  23 seconds    Time  10    Period  Weeks    Status  On-going      PT LONG TERM GOAL #4   Title  Pt will return to challenging HEP that will be carried on to long term exercise program    Baseline  unable    Time  10    Period  Weeks    Status  On-going      PT LONG TERM GOAL #5   Title  pt will be able to complete all household chores independently without limitaiton by fatigue or need for family member assistance    Baseline  still limited/needs intermittent assist from family    Time  10    Period  Weeks    Status  On-going            Plan - 09/27/19 1715    Clinical Impression Statement  Attempted to change workout by doing pilates reformer but after 20 min of basic footwork with low resistance, BP read at 156/105. She denied symptoms but decision was made to hold exercise and address rib pain that pulmonologist asked her to address at PT. Message will be sent to PCP regarding BP.    PT Treatment/Interventions  ADLs/Self Care Home Management;Cryotherapy;Electrical Stimulation;Ultrasound;Moist Heat;Iontophoresis 4mg /ml Dexamethasone;Traction;Gait training;Stair training;Functional mobility training;Therapeutic activities;Balance training;Therapeutic exercise;Patient/family education;Neuromuscular re-education;Manual techniques;Passive range of motion;Dry needling;Spinal Manipulations;Taping    PT Next Visit Plan  message from PCP? walking & monitor vitals    PT Home Exercise Plan  move during TV commercials, step ups, partial squat, TrA, marching, bridge with ball, clam & reverse, anchored bands on physioball, primal push ups, heel raises, slow marching, wall squats    Consulted and Agree with Plan of Care  Patient       Patient will benefit  from skilled therapeutic intervention in order to improve the following deficits and impairments:  Difficulty walking, Decreased endurance, Decreased activity tolerance, Pain, Improper body mechanics, Decreased  balance, Decreased strength, Postural dysfunction  Visit Diagnosis: Difficulty in walking, not elsewhere classified  Muscle weakness (generalized)     Problem List Patient Active Problem List   Diagnosis Date Noted  . TOA (tubo-ovarian abscess) 07/24/2019  . Tubo-ovarian abscess 07/20/2019  . Abdominal pain 07/20/2019  . Physical deconditioning 05/14/2019  . PID (pelvic inflammatory disease) 05/14/2019  . Small bowel obstruction ([place])   . Pelvic mass 05/02/2019  . Elevated glucose 11/29/2018  . Class 1 obesity with serious comorbidity and body mass index (BMI) of 30.0 to 30.9 in adult 11/29/2018    Dmari Schubring C. Malikiah Debarr PT, DPT 09/27/19 5:18 PM   [place], [place], [address] Phone: [phone]   Fax:  [phone]  Name: Gwendolyn Nguyen MRN: [id number] Date of Birth: Oct 16, 1986

## 2019-09-27 NOTE — Progress Notes (Signed)
Medical Nutrition Therapy  Appt Start Time: 9:00am   End Time: 9:45am  Primary concerns today: abdominal pain   Referral diagnosis: R10.9, G89.29- chronic abdominal pain  Preferred learning style: no preference indicated Learning readiness: change in progress   NUTRITION ASSESSMENT   Clinical Medical Hx: small bowel obstruction, tubo-ovarian abscess, pelvic inflammatory disease, abdominal pain  Medications: clonazepam, depo-provera, iron  Notable Signs/Symptoms: abdominal pain, cramping, diarrhea, constipation, vomiting   Lifestyle & Dietary Hx Patient is personable. Works as a Research scientist (medical).   States that, earlier this year she had a small bowel obstruction which hospitalized her. Additionally, in Sept/Oct 2020, pt states she was in the hospital for a few weeks due to a pelvic infection. Later, had another infection in her fallopian/ovarian region. States that, according to her PCP, there is no known/definite cause of her infections and s/s at this time. Pt states that, when she was hospitalized, she had an NG tube. States she is currently taking clonazepam. Previous s/s include vomiting on and off. More recent/current s/s include diarrhea, constipation, abdominal pain, and cramping. Pt states she works with a physical therapist and this is her primary method of treatment at this time, and was otherwise told to monitor herself for other issues.   Typical meal pattern is 2 meals per day with maybe 1-2 snacks. States if she eats a large breakfast she won't eat much until dinner, or if eats a large lunch will not have dinner. Pt states it has been difficult to pinpoint which foods cause her trouble, but some may include [place], cabbage, and spaghetti w/ meat sauce. Foods tolerated well include rice, chicken, fish, bananas, oatmeal, and gumbo. States recently she has "not been eating as well" such as tacos and pizza. States almost all foods cause some abdominal discomfort when she eats. States she  does not drink a lot of fluids during the day, less than 64 ounces for sure.   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)   24-Hr Dietary Recall First Meal: honey ginger tea w/ cayenne pepper Snack: banana (or fruit) Second Meal: tacos (or grilled chicken salad)  Snack: - Third Meal: may skip (or chicken + rice, or pizza) Snack: - Beverages: hot tea, Lipton green tea, water, aloe vera juice, apple juice  Estimated Energy Needs Calories: 2000 Carbohydrate: 225g Protein: 125g Fat: 67g   NUTRITION DIAGNOSIS  Altered GI function ([place]-1.4) related to hx of small bowel obstruction and infection as evidenced by abdominal pain, cramping, N/V, diarrhea, constipation, and hospitalization including NG tube feeding.    NUTRITION INTERVENTION  Nutrition education (E-1) on the following topics:  . Gut health: We discussed the main focus is to reestablish a healthy gut microbiota through use of probiotic and certain foods (prebiotics). Food and supplement recommendations as well as other resources provided.  . Strategies to alleviate abdominal pain, diarrhea, and constipation including increasing fluid intake, consuming small frequent meals (at least 3/day), and having an overall balanced diet.  . Consideration of the low FODMAP diet since IBS could be a potential cause of s/s   Handouts Provided Include   MyPlate Plan Your Portions   Low FODMAP Diet for IBS (provided per pt request, we did not discuss in detail  today)   Learning Style & Readiness for Change Teaching method utilized: Visual & Auditory  Demonstrated degree of understanding via: Teach Back  Barriers to learning/adherence to lifestyle change:  None Identified   Goals Established by Pt . Take probiotic supplement  . Aim for at least 3 meals per day (small, frequent meals/snacks)  . Aim to drink at least 64 ounces of fluid per day    MONITORING & EVALUATION Dietary intake, weekly physical activity, and goals in 1 month.  RD's Notes for Next Visit  . Consider low FODMAP diet  . Anti-inflammatory diet recommendations  . Stress management  Next Steps  Patient is to return to [facility] in 1 month for nutrition follow up visit.

## 2019-09-27 NOTE — Patient Instructions (Signed)
Try to eat small, frequent meals throughout the day. Aim for at least 3 meals and add in snacks as needed.   Aim to drink at least 64 ounces of fluid per day with at least half (32+ ounces) being plain water.   The following foods are good sources of prebiotics (food for probiotics, the good gut bacteria):   Sauerkraut (fermented cabbage)   Asparagus   Pineapple   Onion  Garlic  Bone Broth   ACV (apple cider vinegar)   Kimchi   Ginger   Dandelion Greens    Good probiotic: Nature's Life Acidophilus Probiotic  Try the following for good gut resources:   The Gut Health Doctor (Lifetime[employer].com.au)  [place] (LendingAlerts.de)   [place] (low FODMAP diet resource for IBS)

## 2019-09-28 ENCOUNTER — Other Ambulatory Visit: Payer: Self-pay | Admitting: Registered Nurse

## 2019-09-28 ENCOUNTER — Telehealth: Payer: Self-pay | Admitting: Family Medicine

## 2019-09-28 ENCOUNTER — Telehealth: Payer: Self-pay | Admitting: Physical Therapy

## 2019-09-28 DIAGNOSIS — R030 Elevated blood-pressure reading, without diagnosis of hypertension: Secondary | ICD-10-CM

## 2019-09-28 NOTE — Telephone Encounter (Signed)
Left VM- message from PCP recommends holding PT until cardiology appointment. Will cancel appointments on 10th, 15th, 17th and move forward as appropriate with scheduling of cardiology appointment. Encouraged pt to contact me with any questions.  Cathlene Gardella C. Asuncion Tapscott PT, DPT 09/28/19 8:54 AM

## 2019-09-28 NOTE — Telephone Encounter (Signed)
Copied from [place] [id number]. Topic: General - Other >> Sep 28, 2019 12:12 PM Celene Kras wrote: Reason for CRM: Pt called and is requesting to have information about getting a cardiologist referral placed. Please advise.

## 2019-09-28 NOTE — Progress Notes (Signed)
car

## 2019-09-29 ENCOUNTER — Encounter: Payer: BC Managed Care – PPO | Admitting: Physical Therapy

## 2019-09-29 NOTE — Telephone Encounter (Signed)
Spoke with pt to let her know that the referral has been place for cardiology and once it has been approved, then they will call her to set up an appt. Pk/CMA

## 2019-10-02 NOTE — Progress Notes (Signed)
Cardiology Office Note:   Date:  10/03/2019  NAME:  Gwendolyn Nguyen    MRN: [id number] DOB:  03-Jul-1986   PCP:  Forrest Moron, MD  Cardiologist:  Evalina Field, MD   Referring MD: Maximiano Coss, NP   Chief Complaint  Patient presents with  . New Patient (Initial Visit)  . Shortness of Breath  . Chest Pain    History of Present Illness:   Gwendolyn Nguyen is a 33 y.o. female with a hx of hypertension who is being seen today for the evaluation of abnormal EKG at the request of Forrest Moron, MD.  She presents for evaluation of hypertension during physical therapy.  Over the past several visit she is noted to have systolic blood pressures in the 150 range as well as diastolic blood pressures in the low 100s.  She reports no major symptoms with this.  She does report that she is nervous as her physical therapist wants her to be evaluated by cardiologist before proceeding with physical therapy.  She apparently was admitted recently for extensive abdominal infection as well as pelvic abscesses that required surgery.  She is still walking intermittently with a cane and trying to get her level activity back.  She also reports intermittent right-sided chest pain.  The pain is worse with deep inspiration and sometimes when breathing heavy during exercise.  She has been evaluated by pulmonology for this pain and this was determined to be not related to asthma or any other lung condition.  Other associated symptoms include shortness of breath exertion.  This appears to be more fatigue related from her history.  She reports no symptoms of orthopnea, PND, lower extremity edema.  No history of heart failure.  She reports a strong family history of hypertension.  Her mother and father both have this.  She also reports that her diet is full of salty foods.  She also reports that when in physical therapy she is now quite nervous because of elevated blood pressures being reported.  Also appears that the  staff is monitoring this quite closely.  This also is rather bothersome for her.  She currently takes no blood pressure medications.  No prior history of heart attack or stroke.  Really only history significant for these recent pelvic infections as well as recent surgery.  Past Medical History: Past Medical History:  Diagnosis Date  . Anemia   . Back pain   . Constipation   . Endometriosis   . IBS (irritable bowel syndrome)   . Low hemoglobin   . Multiple food allergies   . PID (acute pelvic inflammatory disease) 99991111   complicated by tuboovarian abscess    Past Surgical History: Past Surgical History:  Procedure Laterality Date  . IR RADIOLOGIST EVAL & MGMT  05/19/2019  . IR RADIOLOGIST EVAL & MGMT  05/24/2019  . LAPAROSCOPIC LYSIS OF ADHESIONS N/A 07/24/2019   Procedure: Laparoscopic Lysis Of Adhesions, Drainage of Pelvic Abscess;  Surgeon: Servando Salina, MD;  Location: [place];  Service: Gynecology;  Laterality: N/A;  . LAPAROSCOPIC LYSIS OF ADHESIONS N/A 07/24/2019   Procedure: Laparoscopic Lysis Of Adhesions and Assist With Drainage of Pelvic Abscess;  Surgeon: Georganna Skeans, MD;  Location: [place];  Service: General;  Laterality: N/A;  . LAPAROSCOPY N/A 07/24/2019   Procedure: LAPAROSCOPY DIAGNOSTIC;  Surgeon: Servando Salina, MD;  Location: [place];  Service: Gynecology;  Laterality: N/A;    Current Medications: Current Meds  Medication Sig  . clonazePAM (KLONOPIN) 0.5 MG  tablet Take 1 tablet (0.5 mg total) by mouth at bedtime. For GI symptoms.  . ferrous sulfate 325 (65 FE) MG tablet Take 325 mg by mouth daily with breakfast. FUSION PLUS 130 mg iron -1,250 mcg cap  . medroxyPROGESTERone (DEPO-PROVERA) 150 MG/ML injection Inject 150 mg into the muscle every 3 (three) months.     Allergies:    Gadolinium derivatives, Shellfish allergy, Sulfa antibiotics, and Iohexol   Social History: Social History   Socioeconomic History  . Marital status: Single    Spouse name:  Not on file  . Number of children: Not on file  . Years of education: Not on file  . Highest education level: Not on file  Occupational History  . Occupation: Social worker  Tobacco Use  . Smoking status: Never Smoker  . Smokeless tobacco: Never Used  Substance and Sexual Activity  . Alcohol use: Yes    Comment: occassional  . Drug use: Never  . Sexual activity: Not on file  Other Topics Concern  . Not on file  Social History Narrative  . Not on file   Social Determinants of Health   Financial Resource Strain:   . Difficulty of Paying Living Expenses: Not on file  Food Insecurity:   . Worried About Charity fundraiser in the Last Year: Not on file  . Ran Out of Food in the Last Year: Not on file  Transportation Needs:   . Lack of Transportation (Medical): Not on file  . Lack of Transportation (Non-Medical): Not on file  Physical Activity:   . Days of Exercise per Week: Not on file  . Minutes of Exercise per Session: Not on file  Stress:   . Feeling of Stress : Not on file  Social Connections:   . Frequency of Communication with Friends and Family: Not on file  . Frequency of Social Gatherings with Friends and Family: Not on file  . Attends Religious Services: Not on file  . Active Member of Clubs or Organizations: Not on file  . Attends Archivist Meetings: Not on file  . Marital Status: Not on file     Family History: The patient's family history includes Diabetes in her maternal grandmother; Hyperlipidemia in her father and mother; Hypertension in her father and mother; Obesity in her father and mother.  ROS:   All other ROS reviewed and negative. Pertinent positives noted in the HPI.     EKGs/Labs/Other Studies Reviewed:   The following studies were personally reviewed by me today:  EKG:  EKG is ordered today.  The ekg ordered today demonstrates normal sinus rhythm, heart rate 78, left axis deviation, nonspecific ST-T changes noted, and was personally  reviewed by me.   Recent Labs: 11/25/2018: TSH 1.160 07/20/2019: ALT 40 07/23/2019: Magnesium 1.4 08/01/2019: Platelets 391 08/03/2019: BUN 10; Creatinine, Ser 1.07; Potassium 3.8; Sodium 128 09/23/2019: Hemoglobin 11.3   Recent Lipid Panel    Component Value Date/Time   CHOL 150 11/25/2018 0928   TRIG 41 11/25/2018 0928   HDL 56 11/25/2018 0928   LDLCALC 86 11/25/2018 0928    Physical Exam:   VS:  BP (!) 128/98 (BP Location: Left Arm, Patient Position: Sitting, Cuff Size: Normal)   Pulse 78   Temp (!) 97.5 F (36.4 C)   Ht 5\' 4"  (1.626 m)   Wt 164 lb (74.4 kg)   BMI 28.15 kg/m    Wt Readings from Last 3 Encounters:  10/03/19 164 lb (74.4 kg)  09/26/19 161  lb 12.8 oz (73.4 kg)  09/23/19 159 lb 9.6 oz (72.4 kg)    General: Well nourished, well developed, in no acute distress Heart: Atraumatic, normal size  Eyes: PEERLA, EOMI  Neck: Supple, no JVD Endocrine: No thryomegaly Cardiac: Normal S1, S2; RRR; no murmurs, rubs, or gallops Lungs: Clear to auscultation bilaterally, no wheezing, rhonchi or rales  Abd: Soft, nontender, no hepatomegaly  Ext: No edema, pulses 2+ Musculoskeletal: No deformities, BUE and BLE strength normal and equal Skin: Warm and dry, no rashes   Neuro: Alert and oriented to person, place, time, and situation, CNII-XII grossly intact, no focal deficits  Psych: Normal mood and affect   ASSESSMENT:   Luceil Dansky is a 33 y.o. female who presents for the following: 1. Nonspecific abnormal electrocardiogram (ECG) (EKG)   2. Essential hypertension   3. SOB (shortness of breath)     PLAN:   1. Nonspecific abnormal electrocardiogram (ECG) (EKG) 2. Essential hypertension -Blood pressure with diastolic hypertension today.  128/98.  EKG with nonspecific EKG changes.  It seems her blood pressure changes have been recent.  I discussed with her that we can try to modify her diet with salt restriction as well as increase activity level to see if this helps.   I also offered her medication.  She is opted at this time for a trial of diet and exercise.  I see no restriction from her from participating physical therapy.  We will let them know this.  We will get an echocardiogram just to make sure heart is structurally normal.  I suspect some of her EKG changes could be related to hypertension.  We will then see her back in 3 months and have she still has elevated blood pressures we will initiate medical therapy.  Again, this should not preclude her from physical therapy.  3. SOB (shortness of breath) -Appears to be more fatigue related anything.  She also has atypical right-sided chest pain.  This is noncardiac in etiology.  I suspect this is all musculoskeletal.  Hopefully her symptoms will continue to improve with physical therapy.  We will evaluate her back in 3 months to reassess symptoms  Disposition: Return in about 3 months (around 01/01/2020).  Medication Adjustments/Labs and Tests Ordered: Current medicines are reviewed at length with the patient today.  Concerns regarding medicines are outlined above.  Orders Placed This Encounter  Procedures  . EKG 12-Lead  . ECHOCARDIOGRAM COMPLETE   No orders of the defined types were placed in this encounter.   Patient Instructions  Medication Instructions:  Your physician recommends that you continue on your current medications as directed. Please refer to the Current Medication list given to you today.  *If you need a refill on your cardiac medications before your next appointment, please call your pharmacy*  Lab Work: NONE ordered at this time of appointment   If you have labs (blood work) drawn today and your tests are completely normal, you will receive your results only by: Marland Kitchen MyChart Message (if you have MyChart) OR . A paper copy in the mail If you have any lab test that is abnormal or we need to change your treatment, we will call you to review the results.  Testing/Procedures: Your  physician has requested that you have an echocardiogram. Echocardiography is a painless test that uses sound waves to create images of your heart. It provides your doctor with information about the size and shape of your heart and how well your heart's chambers and  valves are working. This procedure takes approximately one hour. There are no restrictions for this procedure.   PLEASE SCHEDULE FOR 1-2 WEEKS   Follow-Up: At [facility], you and your health needs are our priority.  As part of our continuing mission to provide you with exceptional heart care, we have created designated Provider Care Teams.  These Care Teams include your primary Cardiologist (physician) and Advanced Practice Providers (APPs -  Physician Assistants and Nurse Practitioners) who all work together to provide you with the care you need, when you need it.  Your next appointment:   3 month(s)  The format for your next appointment:   In Person  Provider:   Eleonore Chiquito, MD  Other Instructions  READ INFORMATION ON THE SALTY 6       Signed, Addison Naegeli. Audie Box, [place]  [address] [place], [place] [address] [phone]  10/03/2019 12:25 PM

## 2019-10-03 ENCOUNTER — Encounter: Payer: Self-pay | Admitting: Cardiovascular Disease

## 2019-10-03 ENCOUNTER — Ambulatory Visit (INDEPENDENT_AMBULATORY_CARE_PROVIDER_SITE_OTHER): Payer: BC Managed Care – PPO | Admitting: Cardiovascular Disease

## 2019-10-03 ENCOUNTER — Other Ambulatory Visit: Payer: Self-pay

## 2019-10-03 VITALS — BP 128/98 | HR 78 | Temp 97.5°F | Ht 64.0 in | Wt 164.0 lb

## 2019-10-03 DIAGNOSIS — I10 Essential (primary) hypertension: Secondary | ICD-10-CM

## 2019-10-03 DIAGNOSIS — R9431 Abnormal electrocardiogram [ECG] [EKG]: Secondary | ICD-10-CM | POA: Diagnosis not present

## 2019-10-03 DIAGNOSIS — R0602 Shortness of breath: Secondary | ICD-10-CM | POA: Diagnosis not present

## 2019-10-03 NOTE — Patient Instructions (Signed)
Medication Instructions:  Your physician recommends that you continue on your current medications as directed. Please refer to the Current Medication list given to you today.  *If you need a refill on your cardiac medications before your next appointment, please call your pharmacy*  Lab Work: NONE ordered at this time of appointment   If you have labs (blood work) drawn today and your tests are completely normal, you will receive your results only by: Marland Kitchen MyChart Message (if you have MyChart) OR . A paper copy in the mail If you have any lab test that is abnormal or we need to change your treatment, we will call you to review the results.  Testing/Procedures: Your physician has requested that you have an echocardiogram. Echocardiography is a painless test that uses sound waves to create images of your heart. It provides your doctor with information about the size and shape of your heart and how well your heart's chambers and valves are working. This procedure takes approximately one hour. There are no restrictions for this procedure.   PLEASE SCHEDULE FOR 1-2 WEEKS   Follow-Up: At [facility], you and your health needs are our priority.  As part of our continuing mission to provide you with exceptional heart care, we have created designated Provider Care Teams.  These Care Teams include your primary Cardiologist (physician) and Advanced Practice Providers (APPs -  Physician Assistants and Nurse Practitioners) who all work together to provide you with the care you need, when you need it.  Your next appointment:   3 month(s)  The format for your next appointment:   In Person  Provider:   Eleonore Chiquito, MD  Other Instructions  READ INFORMATION ON THE SALTY 6

## 2019-10-04 ENCOUNTER — Encounter: Payer: BC Managed Care – PPO | Admitting: Physical Therapy

## 2019-10-06 ENCOUNTER — Encounter: Payer: BC Managed Care – PPO | Admitting: Physical Therapy

## 2019-10-11 ENCOUNTER — Other Ambulatory Visit: Payer: Self-pay

## 2019-10-11 ENCOUNTER — Ambulatory Visit (HOSPITAL_COMMUNITY): Payer: BC Managed Care – PPO | Attending: Cardiovascular Disease

## 2019-10-11 ENCOUNTER — Encounter: Payer: BC Managed Care – PPO | Admitting: Physical Therapy

## 2019-10-11 DIAGNOSIS — R0602 Shortness of breath: Secondary | ICD-10-CM | POA: Diagnosis not present

## 2019-10-11 DIAGNOSIS — I10 Essential (primary) hypertension: Secondary | ICD-10-CM | POA: Insufficient documentation

## 2019-10-12 ENCOUNTER — Encounter: Payer: BC Managed Care – PPO | Admitting: Physical Therapy

## 2019-10-17 ENCOUNTER — Ambulatory Visit: Payer: BC Managed Care – PPO | Admitting: Cardiovascular Disease

## 2019-10-18 MED ORDER — TRAMADOL HCL 50 MG PO TABS: 50.0000 mg | ORAL_TABLET | Freq: Three times a day (TID) | ORAL | 0 refills | Status: DC | PRN

## 2019-10-18 NOTE — Telephone Encounter (Signed)
Can we add this patient for a blood pressure follow up in a week? Please let her know that I sent in tramadol for her pain.

## 2019-10-24 ENCOUNTER — Encounter: Payer: BC Managed Care – PPO | Attending: Family Medicine | Admitting: Dietician

## 2019-10-24 ENCOUNTER — Encounter: Payer: Self-pay | Admitting: Dietician

## 2019-10-24 ENCOUNTER — Other Ambulatory Visit: Payer: Self-pay

## 2019-10-24 DIAGNOSIS — R1030 Lower abdominal pain, unspecified: Secondary | ICD-10-CM | POA: Insufficient documentation

## 2019-10-24 NOTE — Progress Notes (Signed)
Medical Nutrition Therapy  Follow-Up Visit Appt Start Time: 4:00pm   End Time: 4:30pm  Primary concerns today: abdominal pain   Referral diagnosis: R10.9, G89.29- chronic abdominal pain  Preferred learning style: no preference indicated Learning readiness: change in progress  Patient was previously seen on 09/27/2019 for concerns regarding chronic abdominal pain.    NUTRITION ASSESSMENT   Clinical Medical Hx: small bowel obstruction, tubo-ovarian abscess, pelvic inflammatory disease, abdominal pain  Medications: clonazepam, depo-provera, iron  Notable Signs/Symptoms: abdominal pain, cramping, diarrhea, constipation, vomiting   Lifestyle & Dietary Hx Patient states that she has introduced tree elm into her tea in the mornings which seems to help her abdominal pain. States abdominal pain has decreased since her previous nutrition visit a month ago. States she still typically eats 1 main meal per day plus snacks. Foods typically eaten include gumbo, sandwiches (PB&J or grilled cheese), [employer], Chipotle, frozen vegetables, yogurt, and fruit. Patient states she just got a probiotic recently and plans to start taking it. States she is still not drinking much water, but more than she was previously.   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    NUTRITION INTERVENTION  Nutrition education (E-1) on the following topics:  . Anti-inflammation: tips for reducing inflammation causing foods from the diet while incorporating more anti-inflammatory sources. Examples include whole versus processed foods, whole grain vs white bread, fruits and vegetables, omega-3 fatty acids, etc.  . Sodium: sources of high-sodium containing foods to avoid and instead eat lower sodium alternatives. Examples include fresh sliced meat vs packaged deli meat, fresh/frozen vegetables vs prepackaged frozen meals, fast food, etc.   Learning Style & Readiness for Change Teaching method utilized: Visual & Auditory  Demonstrated degree of understanding via: Teach Back  Barriers to learning/adherence to lifestyle change: None Identified   Goals Established by Pt . Take probiotic supplement  . Aim for at least 3 meals per day (small, frequent meals/snacks)  . Aim to drink at least 64 ounces of fluid per day    MONITORING & EVALUATION Dietary intake, weekly physical activity, and goals prn.  Next Steps  Patient is to return to [facility] for nutrition follow up visit prn.

## 2019-10-25 ENCOUNTER — Encounter: Payer: Self-pay | Admitting: Physical Therapy

## 2019-10-25 ENCOUNTER — Ambulatory Visit: Payer: BC Managed Care – PPO | Attending: Family Medicine | Admitting: Physical Therapy

## 2019-10-25 DIAGNOSIS — M6281 Muscle weakness (generalized): Secondary | ICD-10-CM | POA: Diagnosis present

## 2019-10-25 DIAGNOSIS — R262 Difficulty in walking, not elsewhere classified: Secondary | ICD-10-CM | POA: Diagnosis present

## 2019-10-25 NOTE — Therapy (Signed)
[place] [address], [place], [address] Phone: [phone]   Fax:  [phone]  Physical Therapy Treatment  Patient Details  Name: Gwendolyn Nguyen MRN: [id number] Date of Birth: 1985/12/16 Referring Provider (PT): Forrest Moron MD   Encounter Date: 10/25/2019  PT End of Session - 10/25/19 1705    Visit Number  18    Number of Visits  34    Date for PT Re-Evaluation  11/18/19    Authorization Type  BCBS    PT Start Time  1630    PT Stop Time  1709    PT Time Calculation (min)  39 min    Activity Tolerance  Patient tolerated treatment well    Behavior During Therapy  [facility] for tasks assessed/performed       Past Medical History:  Diagnosis Date  . Anemia   . Back pain   . Constipation   . Endometriosis   . IBS (irritable bowel syndrome)   . Low hemoglobin   . Multiple food allergies   . PID (acute pelvic inflammatory disease) [id number]   complicated by tuboovarian abscess    Past Surgical History:  Procedure Laterality Date  . IR RADIOLOGIST EVAL & MGMT  05/19/2019  . IR RADIOLOGIST EVAL & MGMT  05/24/2019  . LAPAROSCOPIC LYSIS OF ADHESIONS N/A 07/24/2019   Procedure: Laparoscopic Lysis Of Adhesions, Drainage of Pelvic Abscess;  Surgeon: Servando Salina, MD;  Location: [place];  Service: Gynecology;  Laterality: N/A;  . LAPAROSCOPIC LYSIS OF ADHESIONS N/A 07/24/2019   Procedure: Laparoscopic Lysis Of Adhesions and Assist With Drainage of Pelvic Abscess;  Surgeon: Georganna Skeans, MD;  Location: [place];  Service: General;  Laterality: N/A;  . LAPAROSCOPY N/A 07/24/2019   Procedure: LAPAROSCOPY DIAGNOSTIC;  Surgeon: Servando Salina, MD;  Location: [place];  Service: Gynecology;  Laterality: N/A;    There were no vitals filed for this visit.  Subjective Assessment - 10/25/19 1635    Subjective  I went to work full day today, am going to try to go full day through [place]. My legs are sore but the good kind of  sore. I am working with my diet and it seems to be helping. I can climb stairs without a rest break    Currently in Pain?  No/denies         [facility] PT Assessment - 10/25/19 0001      Special Tests   Other special tests  5TSTS 25s   after full day of work and 6MWT     6 minute walk test results    Aerobic Endurance Distance Walked  890    Endurance additional comments  no rest break, no SPC                   [facility] Adult PT Treatment/Exercise - 10/25/19 0001      Lumbar Exercises: Stretches   Other Lumbar Stretch Exercise  door pec stretch      Lumbar Exercises: Aerobic   Nustep  5 min L6 UE & LE    Other Aerobic Exercise  6 min walk      Manual Therapy   Manual therapy comments  edu on use of tennis ball             PT Education - 10/25/19 1807    Education Details  POC, recap of RD and cardiologist, monitored rest breaks    Person(s) Educated  Patient    Methods  Explanation    Comprehension  Verbalized understanding;Need further instruction       PT Short Term Goals - 09/12/19 1433      PT SHORT TERM GOAL #1   Title  Pt will demo at least 1200 ft for 6MWT    Baseline  760 feet    Time  5    Period  Weeks    Status  On-going      PT SHORT TERM GOAL #2   Title  able to demo SLS at least 5s each side    Baseline  20 sec bilat.    Time  5    Period  Weeks    Status  Achieved      PT SHORT TERM GOAL #3   Title  Pt will demo good form with sit<>stand    Baseline  wide stance with hinge forward to shift weight    Time  5    Period  Weeks    Status  On-going    Target Date  09/29/19        PT Long Term Goals - 09/12/19 1435      PT LONG TERM GOAL #1   Title  pt will be able to navigate her stairs step over step wihtout difficulty    Baseline  unable to perform with reciprocal gait    Time  10    Period  Weeks    Status  On-going      PT LONG TERM GOAL #2   Title  6MWT at least 2000 ft    Baseline  760 feet    Time  10    Period   Weeks    Status  On-going      PT LONG TERM GOAL #3   Title  5TSTS <10s    Baseline  23 seconds    Time  10    Period  Weeks    Status  On-going      PT LONG TERM GOAL #4   Title  Pt will return to challenging HEP that will be carried on to long term exercise program    Baseline  unable    Time  10    Period  Weeks    Status  On-going      PT LONG TERM GOAL #5   Title  pt will be able to complete all household chores independently without limitaiton by fatigue or need for family member assistance    Baseline  still limited/needs intermittent assist from family    Time  10    Period  Weeks    Status  On-going            Plan - 10/25/19 1704    Clinical Impression Statement  Even after a full work day and ambulating without cane, pt only demo slight decreases in 5TSTS and 6MWT. She reported feeling really good after and is planning to work the next two days. Cardiologist denies restrictions for PT as pt is controlling BP through diet and we will monitor symptoms.    PT Treatment/Interventions  ADLs/Self Care Home Management;Cryotherapy;Electrical Stimulation;Ultrasound;Moist Heat;Iontophoresis 4mg /ml Dexamethasone;Traction;Gait training;Stair training;Functional mobility training;Therapeutic activities;Balance training;Therapeutic exercise;Patient/family education;Neuromuscular re-education;Manual techniques;Passive range of motion;Dry needling;Spinal Manipulations;Taping    PT Next Visit Plan  chest area pectoralis on Right with stretching and tennis ball??    PT Home Exercise Plan  move during TV commercials, step ups, partial squat, TrA, marching, bridge with ball, clam & reverse, anchored bands on physioball,  primal push ups, heel raises, slow marching, wall squats    Consulted and Agree with Plan of Care  Patient       Patient will benefit from skilled therapeutic intervention in order to improve the following deficits and impairments:  Difficulty walking, Decreased  endurance, Decreased activity tolerance, Pain, Improper body mechanics, Decreased balance, Decreased strength, Postural dysfunction  Visit Diagnosis: Difficulty in walking, not elsewhere classified  Muscle weakness (generalized)     Problem List Patient Active Problem List   Diagnosis Date Noted  . TOA (tubo-ovarian abscess) 07/24/2019  . Tubo-ovarian abscess 07/20/2019  . Abdominal pain 07/20/2019  . Physical deconditioning 05/14/2019  . PID (pelvic inflammatory disease) 05/14/2019  . Small bowel obstruction ([facility])   . Pelvic mass 05/02/2019  . Elevated glucose 11/29/2018  . Class 1 obesity with serious comorbidity and body mass index (BMI) of 30.0 to 30.9 in adult 11/29/2018    Tiffanyann Deroo C. Crystallynn Noorani PT, DPT 10/25/19 6:08 PM   [facility] [address] [place], [place], [address] Phone: [phone]   Fax:  [phone]  Name: Chenequa Chiaverini MRN: [id number] Date of Birth: [birth year]

## 2019-10-26 ENCOUNTER — Other Ambulatory Visit: Payer: Self-pay

## 2019-10-26 ENCOUNTER — Encounter: Payer: Self-pay | Admitting: Family Medicine

## 2019-10-26 ENCOUNTER — Ambulatory Visit: Payer: BC Managed Care – PPO | Admitting: Family Medicine

## 2019-10-26 VITALS — BP 125/82 | HR 89 | Temp 98.5°F | Resp 17 | Ht 64.0 in | Wt 170.4 lb

## 2019-10-26 DIAGNOSIS — I10 Essential (primary) hypertension: Secondary | ICD-10-CM

## 2019-10-26 NOTE — Progress Notes (Signed)
Established Patient Office Visit  Subjective:  Patient ID: Gwendolyn Nguyen, female    DOB: 02-25-1986  Age: 34 y.o. MRN: [id number]  CC:  Chief Complaint  Patient presents with  . Hypertension    1 week f/u    HPI Gwendolyn Nguyen presents for   Hypertension: Patient here for follow-up of elevated blood pressure. She is exercising with PT and is adherent to low salt diet.  Blood pressure is well controlled at home. Cardiac symptoms none. Patient denies chest pain, exertional chest pressure/discomfort, irregular heart beat, near-syncope and orthopnea.  Cardiovascular risk factors: hypertension. Use of agents associated with hypertension: none. History of target organ damage: none.  BP Readings from Last 3 Encounters:  10/26/19 125/82  10/03/19 (!) 128/98  09/27/19 (!) 156/105   She has been going to PT and has returned to work.  She is eating better and using a new brand of low sodium seasoning. She cuts out additional salt.    Past Medical History:  Diagnosis Date  . Anemia   . Back pain   . Constipation   . Endometriosis   . IBS (irritable bowel syndrome)   . Low hemoglobin   . Multiple food allergies   . PID (acute pelvic inflammatory disease) 99991111   complicated by tuboovarian abscess    Past Surgical History:  Procedure Laterality Date  . IR RADIOLOGIST EVAL & MGMT  05/19/2019  . IR RADIOLOGIST EVAL & MGMT  05/24/2019  . LAPAROSCOPIC LYSIS OF ADHESIONS N/A 07/24/2019   Procedure: Laparoscopic Lysis Of Adhesions, Drainage of Pelvic Abscess;  Surgeon: Servando Salina, MD;  Location: [facility];  Service: Gynecology;  Laterality: N/A;  . LAPAROSCOPIC LYSIS OF ADHESIONS N/A 07/24/2019   Procedure: Laparoscopic Lysis Of Adhesions and Assist With Drainage of Pelvic Abscess;  Surgeon: Georganna Skeans, MD;  Location: [facility];  Service: General;  Laterality: N/A;  . LAPAROSCOPY N/A 07/24/2019   Procedure: LAPAROSCOPY DIAGNOSTIC;  Surgeon: Servando Salina, MD;  Location: [facility];  Service: Gynecology;  Laterality: N/A;    Family History  Problem Relation Age of Onset  . Hyperlipidemia Mother   . Hypertension Mother   . Obesity Mother   . Hyperlipidemia Father   . Hypertension Father   . Obesity Father   . Diabetes Maternal Grandmother     Social History   Socioeconomic History  . Marital status: Single    Spouse name: Not on file  . Number of children: Not on file  . Years of education: Not on file  . Highest education level: Not on file  Occupational History  . Occupation: Social worker  Tobacco Use  . Smoking status: Never Smoker  . Smokeless tobacco: Never Used  Substance and Sexual Activity  . Alcohol use: Yes    Comment: occassional  . Drug use: Never  . Sexual activity: Not on file  Other Topics Concern  . Not on file  Social History Narrative  . Not on file   Social Determinants of Health   Financial Resource Strain:   . Difficulty of Paying Living Expenses: Not on file  Food Insecurity:   . Worried About Charity fundraiser in the Last Year: Not on file  . Ran Out of Food in the Last Year: Not on file  Transportation Needs:   . Lack of Transportation (Medical): Not on file  . Lack of Transportation (Non-Medical): Not on file  Physical Activity:   . Days of Exercise per Week: Not on file  .  Minutes of Exercise per Session: Not on file  Stress:   . Feeling of Stress : Not on file  Social Connections:   . Frequency of Communication with Friends and Family: Not on file  . Frequency of Social Gatherings with Friends and Family: Not on file  . Attends Religious Services: Not on file  . Active Member of Clubs or Organizations: Not on file  . Attends Archivist Meetings: Not on file  . Marital Status: Not on file  Intimate Partner Violence:   . Fear of Current or Ex-Partner: Not on file  . Emotionally Abused: Not on file  . Physically Abused: Not on file  . Sexually Abused: Not on file    Outpatient Medications Prior  to Visit  Medication Sig Dispense Refill  . clonazePAM (KLONOPIN) 0.5 MG tablet Take 1 tablet (0.5 mg total) by mouth at bedtime. For GI symptoms. 30 tablet 1  . cyclobenzaprine (FLEXERIL) 5 MG tablet Take 5 mg by mouth 3 (three) times daily as needed for muscle spasms.    . ferrous sulfate 325 (65 FE) MG tablet Take 325 mg by mouth daily with breakfast. FUSION PLUS 130 mg iron -1,250 mcg cap    . medroxyPROGESTERone (DEPO-PROVERA) 150 MG/ML injection Inject 150 mg into the muscle every 3 (three) months.    . norethindrone (AYGESTIN) 5 MG tablet Take 5 mg by mouth daily.     No facility-administered medications prior to visit.    Allergies  Allergen Reactions  . Gadolinium Derivatives Hives, Itching and Nausea And Vomiting    Pt vomited immediately during injection.  15 minutes later, pt developed hives and itching.   . Shellfish Allergy Hives  . Sulfa Antibiotics Other (See Comments)    Patient was told to NOT TAKE THIS  . Iohexol Hives and Rash    08-2018, rash and hives, needs pre meds S/W PATIENT STATED REACTION FROM CT CONTRAST 11/19    ROS Review of Systems Review of Systems  Constitutional: Negative for activity change, appetite change, chills and fever.  HENT: Negative for congestion, nosebleeds, trouble swallowing and voice change.   Respiratory: Negative for cough, shortness of breath and wheezing.   Gastrointestinal: Negative for diarrhea, nausea and vomiting.  Genitourinary: Negative for difficulty urinating, dysuria, flank pain and hematuria.  Musculoskeletal: Negative for back pain, joint swelling and neck pain.  Neurological: Negative for dizziness, speech difficulty, light-headedness and numbness.  See HPI. All other review of systems negative.     Objective:    Physical Exam  BP 125/82 (BP Location: Right Arm, Patient Position: Sitting, Cuff Size: Normal)   Pulse 89   Temp 98.5 F (36.9 C) (Oral)   Resp 17   Ht 5\' 4"  (1.626 m)   Wt 170 lb 6.4 oz (77.3 kg)    SpO2 100%   BMI 29.25 kg/m  Wt Readings from Last 3 Encounters:  10/26/19 170 lb 6.4 oz (77.3 kg)  10/03/19 164 lb (74.4 kg)  09/26/19 161 lb 12.8 oz (73.4 kg)    Physical Exam  Constitutional: Oriented to person, place, and time. Appears well-developed and well-nourished.  HENT:  Head: Normocephalic and atraumatic.  Eyes: Conjunctivae and EOM are normal.  Cardiovascular: Normal rate, regular rhythm, normal heart sounds and intact distal pulses.  No murmur heard. Pulmonary/Chest: Effort normal and breath sounds normal. No stridor. No respiratory distress. Has no wheezes.  Neurological: Is alert and oriented to person, place, and time.  Skin: Skin is warm. Capillary refill takes  less than 2 seconds.  Psychiatric: Has a normal mood and affect. Behavior is normal. Judgment and thought content normal.   Health Maintenance Due  Topic Date Due  . PAP SMEAR-Modifier  02/25/2009    There are no preventive care reminders to display for this patient.  Lab Results  Component Value Date   TSH 1.160 11/25/2018   Lab Results  Component Value Date   WBC 7.6 09/23/2019   HGB 11.3 09/23/2019   HCT 34.5 09/23/2019   MCV 84.4 09/23/2019   PLT 391 08/01/2019   Lab Results  Component Value Date   NA 128 (L) 08/03/2019   K 3.8 08/03/2019   CO2 21 (L) 08/03/2019   GLUCOSE 453 (H) 08/03/2019   BUN 10 08/03/2019   CREATININE 1.07 (H) 08/03/2019   BILITOT 0.4 07/20/2019   ALKPHOS 50 07/20/2019   AST 32 07/20/2019   ALT 40 07/20/2019   PROT 7.4 07/20/2019   ALBUMIN 3.8 07/20/2019   CALCIUM 8.1 (L) 08/03/2019   ANIONGAP 13 08/03/2019   GFR 78.11 06/09/2019   Lab Results  Component Value Date   CHOL 150 11/25/2018   Lab Results  Component Value Date   HDL 56 11/25/2018   Lab Results  Component Value Date   LDLCALC 86 11/25/2018   Lab Results  Component Value Date   TRIG 41 11/25/2018   No results found for: CHOLHDL No results found for: HGBA1C    Assessment & Plan:    Problem List Items Addressed This Visit   Visit Diagnoses    Diastolic hypertension    -  Primary     Improved with dietary restriction of salt and improved stress mgmt   No orders of the defined types were placed in this encounter.   Follow-up: No follow-ups on file.    Forrest Moron, MD

## 2019-10-26 NOTE — Patient Instructions (Addendum)
  BP Readings from Last 3 Encounters:  10/26/19 125/82  10/03/19 (!) 128/98  09/27/19 (!) 156/105      If you have lab work done today you will be contacted with your lab results within the next 2 weeks.  If you have not heard from [place] then please contact us. The fastest way to get your results is to register for My Chart.   IF you received an x-ray today, you will receive an invoice from [facility]. Please contact [facility] at [phone] with questions or concerns regarding your invoice.   IF you received labwork today, you will receive an invoice from [facility]. Please contact [facility] at [phone] with questions or concerns regarding your invoice.   Our billing staff will not be able to assist you with questions regarding bills from these companies.  You will be contacted with the lab results as soon as they are available. The fastest way to get your results is to activate your My Chart account. Instructions are located on the last page of this paperwork. If you have not heard from [place] regarding the results in 2 weeks, please contact this office.

## 2019-10-27 ENCOUNTER — Ambulatory Visit: Payer: BC Managed Care – PPO | Admitting: Physical Therapy

## 2019-11-01 ENCOUNTER — Telehealth: Payer: Self-pay | Admitting: Family Medicine

## 2019-11-01 ENCOUNTER — Encounter: Payer: Self-pay | Admitting: Physical Therapy

## 2019-11-01 ENCOUNTER — Encounter: Payer: Self-pay | Admitting: Family Medicine

## 2019-11-01 ENCOUNTER — Other Ambulatory Visit: Payer: Self-pay

## 2019-11-01 ENCOUNTER — Ambulatory Visit: Payer: BC Managed Care – PPO | Admitting: Physical Therapy

## 2019-11-01 DIAGNOSIS — R262 Difficulty in walking, not elsewhere classified: Secondary | ICD-10-CM

## 2019-11-01 DIAGNOSIS — M6281 Muscle weakness (generalized): Secondary | ICD-10-CM

## 2019-11-01 NOTE — Telephone Encounter (Signed)
Pt needs work note that states she may work remote / in office.   Please Advise  Pt would like to have that through mychart and a hard copy for her to pick up

## 2019-11-01 NOTE — Therapy (Signed)
[place], [place], [address] Phone: [phone]   Fax:  [phone]  Physical Therapy Treatment  Patient Details  Name: Gwendolyn Nguyen MRN: [id number] Date of Birth: Mar 15, 1986 Referring Provider (PT): Forrest Moron MD   Encounter Date: 11/01/2019  PT End of Session - 11/01/19 1625    Visit Number  19    Number of Visits  34    Date for PT Re-Evaluation  11/18/19    Authorization Type  BCBS    PT Start Time  1620    PT Stop Time  1701    PT Time Calculation (min)  41 min    Activity Tolerance  Patient tolerated treatment well    Behavior During Therapy  [facility] for tasks assessed/performed       Past Medical History:  Diagnosis Date  . Anemia   . Back pain   . Constipation   . Endometriosis   . IBS (irritable bowel syndrome)   . Low hemoglobin   . Multiple food allergies   . PID (acute pelvic inflammatory disease) 66/4403   complicated by tuboovarian abscess    Past Surgical History:  Procedure Laterality Date  . IR RADIOLOGIST EVAL & MGMT  05/19/2019  . IR RADIOLOGIST EVAL & MGMT  05/24/2019  . LAPAROSCOPIC LYSIS OF ADHESIONS N/A 07/24/2019   Procedure: Laparoscopic Lysis Of Adhesions, Drainage of Pelvic Abscess;  Surgeon: Servando Salina, MD;  Location: [place];  Service: Gynecology;  Laterality: N/A;  . LAPAROSCOPIC LYSIS OF ADHESIONS N/A 07/24/2019   Procedure: Laparoscopic Lysis Of Adhesions and Assist With Drainage of Pelvic Abscess;  Surgeon: Georganna Skeans, MD;  Location: [place];  Service: General;  Laterality: N/A;  . LAPAROSCOPY N/A 07/24/2019   Procedure: LAPAROSCOPY DIAGNOSTIC;  Surgeon: Servando Salina, MD;  Location: [place];  Service: Gynecology;  Laterality: N/A;    There were no vitals filed for this visit.  Subjective Assessment - 11/01/19 1621    Subjective  Went 3 days last week and went home to go to sleep after each day. Frustrated with work otherwise.    Currently in  Pain?  No/denies                       [facility] Adult PT Treatment/Exercise - 11/01/19 0001      Lumbar Exercises: Aerobic   Stationary Bike  5 min L4 end of session    Other Aerobic Exercise  6 min walk      Lumbar Exercises: Standing   Wall Slides Limitations  5x10s    Other Standing Lumbar Exercises  tandem balance static & with head turns; rocker board A/P & lat static & dynamic    Other Standing Lumbar Exercises  single leg hinge & kick               PT Short Term Goals - 11/01/19 1624      PT SHORT TERM GOAL #1   Title  Pt will demo at least 1200 ft for 6MWT    Baseline  1064 ft today, improved from 615 ft at eval    Status  Not Met      PT SHORT TERM GOAL #2   Title  able to demo SLS at least 5s each side    Baseline  20 sec bilat.    Status  Achieved      PT SHORT TERM GOAL #3   Title  Pt will demo good form  with sit<>stand    Status  Achieved        PT Long Term Goals - 09/12/19 1435      PT LONG TERM GOAL #1   Title  pt will be able to navigate her stairs step over step wihtout difficulty    Baseline  unable to perform with reciprocal gait    Time  10    Period  Weeks    Status  On-going      PT LONG TERM GOAL #2   Title  6MWT at least 2000 ft    Baseline  760 feet    Time  10    Period  Weeks    Status  On-going      PT LONG TERM GOAL #3   Title  5TSTS <10s    Baseline  23 seconds    Time  10    Period  Weeks    Status  On-going      PT LONG TERM GOAL #4   Title  Pt will return to challenging HEP that will be carried on to long term exercise program    Baseline  unable    Time  10    Period  Weeks    Status  On-going      PT LONG TERM GOAL #5   Title  pt will be able to complete all household chores independently without limitaiton by fatigue or need for family member assistance    Baseline  still limited/needs intermittent assist from family    Time  10    Period  Weeks    Status  On-going            Plan  - 11/01/19 1652    Clinical Impression Statement  Improved toward STG distance in 6MWT but is still short. Slight pause in progress as expected with fatigue of return to work. Added balance for more coordinated challenges of musculature which was difficult.    PT Treatment/Interventions  ADLs/Self Care Home Management;Cryotherapy;Electrical Stimulation;Ultrasound;Moist Heat;Iontophoresis 34m/ml Dexamethasone;Traction;Gait training;Stair training;Functional mobility training;Therapeutic activities;Balance training;Therapeutic exercise;Patient/family education;Neuromuscular re-education;Manual techniques;Passive range of motion;Dry needling;Spinal Manipulations;Taping    PT Next Visit Plan  end with eliptical, stepping obstacles    PT Home Exercise Plan  move during TV commercials, step ups, partial squat, TrA, marching, bridge with ball, clam & reverse, anchored bands on physioball, primal push ups, heel raises, slow marching, wall squats    Consulted and Agree with Plan of Care  Patient       Patient will benefit from skilled therapeutic intervention in order to improve the following deficits and impairments:  Difficulty walking, Decreased endurance, Decreased activity tolerance, Pain, Improper body mechanics, Decreased balance, Decreased strength, Postural dysfunction  Visit Diagnosis: Difficulty in walking, not elsewhere classified  Muscle weakness (generalized)     Problem List Patient Active Problem List   Diagnosis Date Noted  . TOA (tubo-ovarian abscess) 07/24/2019  . Tubo-ovarian abscess 07/20/2019  . Abdominal pain 07/20/2019  . Physical deconditioning 05/14/2019  . PID (pelvic inflammatory disease) 05/14/2019  . Small bowel obstruction ([place]   . Pelvic mass 05/02/2019  . Elevated glucose 11/29/2018  . Class 1 obesity with serious comorbidity and body mass index (BMI) of 30.0 to 30.9 in adult 11/29/2018    Jim Philemon C. Patrisia Faeth PT, DPT 11/01/19 5:03 PM   [place] [address]: [phone]  Fax:  [phone] Name: Gwendolyn JhaveriMRN: [id number]  Date of Birth: 08-20-1986

## 2019-11-02 NOTE — Telephone Encounter (Signed)
Please Advise

## 2019-11-03 ENCOUNTER — Ambulatory Visit: Payer: BC Managed Care – PPO | Admitting: Physical Therapy

## 2019-11-03 ENCOUNTER — Other Ambulatory Visit: Payer: Self-pay | Admitting: Family Medicine

## 2019-11-03 ENCOUNTER — Other Ambulatory Visit: Payer: Self-pay

## 2019-11-03 DIAGNOSIS — R262 Difficulty in walking, not elsewhere classified: Secondary | ICD-10-CM | POA: Diagnosis not present

## 2019-11-03 DIAGNOSIS — M6281 Muscle weakness (generalized): Secondary | ICD-10-CM

## 2019-11-03 NOTE — Telephone Encounter (Signed)
Spoke with pt to inform her of your response. Pt stated that even though she has not built up the strength to be back at work on a full-time bases, and not regain the necessary strength, you could not write the not based on those conditions. She stated that she has someone in the now to help her with certain things that she can not do. As you may know, she is in therapy but still needs more time. She also stated that her job is requesting a work not for intermitted leave since she is not able to attend work so that she can basically to be excused from work.  Please Advise

## 2019-11-03 NOTE — Telephone Encounter (Signed)
Please let the patient know that I cannot say that it is a medical necessity to work from home because her [id number] Risk of Complications is a 2 due to her history of hypertension and her BMI.    She can discuss it with her supervisor but I could not do the work note for her based on her medical history as we have to go by the guidelines.

## 2019-11-04 ENCOUNTER — Encounter: Payer: Self-pay | Admitting: Physical Therapy

## 2019-11-04 NOTE — Therapy (Signed)
[place], [place], [address] Phone: [phone]   Fax:  [phone]  Physical Therapy Treatment  Patient Details  Name: Gwendolyn Nguyen MRN: [id number] Date of Birth: 03-07-1986 Referring Provider (PT): Forrest Moron MD   Encounter Date: 11/03/2019  PT End of Session - 11/03/19 1716    Visit Number  20    Number of Visits  34    Date for PT Re-Evaluation  11/18/19    Authorization Type  BCBS    PT Start Time  1632    PT Stop Time  1711    PT Time Calculation (min)  39 min    Activity Tolerance  Patient tolerated treatment well    Behavior During Therapy  [facility] for tasks assessed/performed       Past Medical History:  Diagnosis Date  . Anemia   . Back pain   . Constipation   . Endometriosis   . IBS (irritable bowel syndrome)   . Low hemoglobin   . Multiple food allergies   . PID (acute pelvic inflammatory disease) 97/9480   complicated by tuboovarian abscess    Past Surgical History:  Procedure Laterality Date  . IR RADIOLOGIST EVAL & MGMT  05/19/2019  . IR RADIOLOGIST EVAL & MGMT  05/24/2019  . LAPAROSCOPIC LYSIS OF ADHESIONS N/A 07/24/2019   Procedure: Laparoscopic Lysis Of Adhesions, Drainage of Pelvic Abscess;  Surgeon: Servando Salina, MD;  Location: [place];  Service: Gynecology;  Laterality: N/A;  . LAPAROSCOPIC LYSIS OF ADHESIONS N/A 07/24/2019   Procedure: Laparoscopic Lysis Of Adhesions and Assist With Drainage of Pelvic Abscess;  Surgeon: Georganna Skeans, MD;  Location: [place];  Service: General;  Laterality: N/A;  . LAPAROSCOPY N/A 07/24/2019   Procedure: LAPAROSCOPY DIAGNOSTIC;  Surgeon: Servando Salina, MD;  Location: [place];  Service: Gynecology;  Laterality: N/A;    There were no vitals filed for this visit.  Subjective Assessment - 11/04/19 0711    Subjective  Fearful that she may lose her job. 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 Currently doing Tues, Wed & thurs and sleeps for almost 2 days due to exhaustion following and requires use of narcotics after day 2 due to body aches and nausea. Is exhausted by prep for work- is unable to stand in shower long enough to perform all necessary self-hygeine.    Patient Stated Goals  sleep, workout, daily activities    Currently in Pain?  No/denies                       [facility] Adult PT Treatment/Exercise - 11/03/19 0001      Lumbar Exercises: Aerobic   Elliptical  5 min L1 ramp 1      Lumbar Exercises: Standing   Lifting Limitations  chopsx10    Other Standing Lumbar Exercises  5lb squat & OH lift x10    Other Standing Lumbar Exercises  airex marching 30son/30soff x5             PT Education - 11/04/19 0713    Education Details  see plan    Person(s) Educated  Patient    Methods  Explanation    Comprehension  Verbalized understanding       PT Short Term Goals - 11/01/19 1624  PT SHORT TERM GOAL #1   Title  Pt will demo at least 1200 ft for 6MWT    Baseline  1064 ft today, improved from 615 ft at eval    Status  Not Met      PT SHORT TERM GOAL #2   Title  able to demo SLS at least 5s each side    Baseline  20 sec bilat.    Status  Achieved      PT SHORT TERM GOAL #3   Title  Pt will demo good form with sit<>stand    Status  Achieved        PT Long Term Goals - 09/12/19 1435      PT LONG TERM GOAL #1   Title  pt will be able to navigate her stairs step over step wihtout difficulty    Baseline  unable to perform with reciprocal gait    Time  10    Period  Weeks    Status  On-going      PT LONG TERM GOAL #2   Title  6MWT at least 2000 ft    Baseline  760 feet    Time  10    Period  Weeks    Status  On-going      PT LONG TERM GOAL #3   Title  5TSTS <10s    Baseline  23 seconds    Time   10    Period  Weeks    Status  On-going      PT LONG TERM GOAL #4   Title  Pt will return to challenging HEP that will be carried on to long term exercise program    Baseline  unable    Time  10    Period  Weeks    Status  On-going      PT LONG TERM GOAL #5   Title  pt will be able to complete all household chores independently without limitaiton by fatigue or need for family member assistance    Baseline  still limited/needs intermittent assist from family    Time  10    Period  Weeks    Status  On-going            Plan - 11/03/19 1706    Clinical Impression Statement  We had a long discussion today regarding her deconditioning and return to work. She has made significant improvement since beginning PT when she required a walker for support and able to cover about 600 feet in 6MWT and required multiple rest breaks. I suggested that she push her endurance to go to work on Mon-Wed-Friday rather than 3 days in a a row to avoid reaching severe fatigue and pain and we will continue to progress and challenge her as appropriate. Note provided for work and pt verbalized agreement.    PT Treatment/Interventions  ADLs/Self Care Home Management;Cryotherapy;Electrical Stimulation;Ultrasound;Moist Heat;Iontophoresis 68m/ml Dexamethasone;Traction;Gait training;Stair training;Functional mobility training;Therapeutic activities;Balance training;Therapeutic exercise;Patient/family education;Neuromuscular re-education;Manual techniques;Passive range of motion;Dry needling;Spinal Manipulations;Taping    PT Next Visit Plan  obstacles, stairs, unstable surfaces    PT Home Exercise Plan  move during TV commercials, step ups, partial squat, TrA, marching, bridge with ball, clam & reverse, anchored bands on physioball, primal push ups, heel raises, slow marching, wall squats; 5x 30s on/off exercises    Consulted and Agree with Plan of Care  Patient       Patient will benefit from skilled therapeutic  intervention in order to improve the  following deficits and impairments:  Difficulty walking, Decreased endurance, Decreased activity tolerance, Pain, Improper body mechanics, Decreased balance, Decreased strength, Postural dysfunction  Visit Diagnosis: Difficulty in walking, not elsewhere classified  Muscle weakness (generalized)     Problem List Patient Active Problem List   Diagnosis Date Noted  . TOA (tubo-ovarian abscess) 07/24/2019  . Tubo-ovarian abscess 07/20/2019  . Abdominal pain 07/20/2019  . Physical deconditioning 05/14/2019  . PID (pelvic inflammatory disease) 05/14/2019  . Small bowel obstruction ([place])   . Pelvic mass 05/02/2019  . Elevated glucose 11/29/2018  . Class 1 obesity with serious comorbidity and body mass index (BMI) of 30.0 to 30.9 in adult 11/29/2018   Azaylia Fong C. Hailie Searight PT, DPT 11/04/19 7:18 AM   [place] [facility] [address] [place], [place], [address] Phone: [phone]   Fax:  [phone]  Name: Araminta Zorn MRN: [id number] Date of Birth: [birth year]

## 2019-11-08 ENCOUNTER — Other Ambulatory Visit: Payer: Self-pay

## 2019-11-08 ENCOUNTER — Ambulatory Visit: Payer: BC Managed Care – PPO | Admitting: Physical Therapy

## 2019-11-08 ENCOUNTER — Encounter: Payer: Self-pay | Admitting: Physical Therapy

## 2019-11-08 DIAGNOSIS — M6281 Muscle weakness (generalized): Secondary | ICD-10-CM

## 2019-11-08 DIAGNOSIS — R262 Difficulty in walking, not elsewhere classified: Secondary | ICD-10-CM | POA: Diagnosis not present

## 2019-11-08 NOTE — Therapy (Signed)
[place], [place], [address] Phone: [phone]   Fax:  [phone]  Physical Therapy Treatment  Patient Details  Name: Gwendolyn Nguyen MRN: [id number] Date of Birth: Jul 15, 1986 Referring Provider (PT): Forrest Moron MD   Encounter Date: 11/08/2019  PT End of Session - 11/08/19 1634    Visit Number  21    Number of Visits  34    Date for PT Re-Evaluation  11/18/19    Authorization Type  BCBS    PT Start Time  1630    PT Stop Time  1708    PT Time Calculation (min)  38 min    Activity Tolerance  Patient tolerated treatment well    Behavior During Therapy  [facility] for tasks assessed/performed       Past Medical History:  Diagnosis Date  . Anemia   . Back pain   . Constipation   . Endometriosis   . IBS (irritable bowel syndrome)   . Low hemoglobin   . Multiple food allergies   . PID (acute pelvic inflammatory disease) 42/5956   complicated by tuboovarian abscess    Past Surgical History:  Procedure Laterality Date  . IR RADIOLOGIST EVAL & MGMT  05/19/2019  . IR RADIOLOGIST EVAL & MGMT  05/24/2019  . LAPAROSCOPIC LYSIS OF ADHESIONS N/A 07/24/2019   Procedure: Laparoscopic Lysis Of Adhesions, Drainage of Pelvic Abscess;  Surgeon: Servando Salina, MD;  Location: [place];  Service: Gynecology;  Laterality: N/A;  . LAPAROSCOPIC LYSIS OF ADHESIONS N/A 07/24/2019   Procedure: Laparoscopic Lysis Of Adhesions and Assist With Drainage of Pelvic Abscess;  Surgeon: Georganna Skeans, MD;  Location: [place];  Service: General;  Laterality: N/A;  . LAPAROSCOPY N/A 07/24/2019   Procedure: LAPAROSCOPY DIAGNOSTIC;  Surgeon: Servando Salina, MD;  Location: [place];  Service: Gynecology;  Laterality: N/A;    There were no vitals filed for this visit.  Subjective Assessment - 11/08/19 1632    Subjective  Working Mon-Wed-Fri this week and home on Tues and Thurs. I am tired about a 6/10 (I could really go for a nap, but  I could still do work).    How long can you walk comfortably?  always difficulty, uses [facility]    Patient Stated Goals  sleep, workout, daily activities    Currently in Pain?  No/denies                       [facility] Adult PT Treatment/Exercise - 11/08/19 0001      Lumbar Exercises: Stretches   Passive Hamstring Stretch Limitations  seated in chair      Lumbar Exercises: Aerobic   Nustep  5 min L6 Ue & LE      Lumbar Exercises: Standing   Push / Pull Sled  plyometric wall triceps push ups    Other Standing Lumbar Exercises  SLS wiht rebounder    Other Standing Lumbar Exercises  bosu balance & double leg squats      Lumbar Exercises: Seated   Other Seated Lumbar Exercises  stool scoots      Lumbar Exercises: Prone   Other Prone Lumbar Exercises  superman               PT Short Term Goals - 11/01/19 1624      PT SHORT TERM GOAL #1   Title  Pt will demo at least 1200 ft for 6MWT    Baseline  1064 ft today,  improved from 615 ft at eval    Status  Not Met      PT SHORT TERM GOAL #2   Title  able to demo SLS at least 5s each side    Baseline  20 sec bilat.    Status  Achieved      PT SHORT TERM GOAL #3   Title  Pt will demo good form with sit<>stand    Status  Achieved        PT Long Term Goals - 09/12/19 1435      PT LONG TERM GOAL #1   Title  pt will be able to navigate her stairs step over step wihtout difficulty    Baseline  unable to perform with reciprocal gait    Time  10    Period  Weeks    Status  On-going      PT LONG TERM GOAL #2   Title  6MWT at least 2000 ft    Baseline  760 feet    Time  10    Period  Weeks    Status  On-going      PT LONG TERM GOAL #3   Title  5TSTS <10s    Baseline  23 seconds    Time  10    Period  Weeks    Status  On-going      PT LONG TERM GOAL #4   Title  Pt will return to challenging HEP that will be carried on to long term exercise program    Baseline  unable    Time  10    Period  Weeks     Status  On-going      PT LONG TERM GOAL #5   Title  pt will be able to complete all household chores independently without limitaiton by fatigue or need for family member assistance    Baseline  still limited/needs intermittent assist from family    Time  10    Period  Weeks    Status  On-going            Plan - 11/08/19 1710    Clinical Impression Statement  Continued with gross strengthening and full body exercises to progress endurance. was unable to push away from the wall in a triceps wall push up position indicating need for further postural strengthening.    PT Treatment/Interventions  ADLs/Self Care Home Management;Cryotherapy;Electrical Stimulation;Ultrasound;Moist Heat;Iontophoresis 80m/ml Dexamethasone;Traction;Gait training;Stair training;Functional mobility training;Therapeutic activities;Balance training;Therapeutic exercise;Patient/family education;Neuromuscular re-education;Manual techniques;Passive range of motion;Dry needling;Spinal Manipulations;Taping    PT Next Visit Plan  obstacles, stairs, repeat stool scoots, prone exercises    PT Home Exercise Plan  move during TV commercials, step ups, partial squat, TrA, marching, bridge with ball, clam & reverse, anchored bands on physioball, primal push ups, heel raises, slow marching, wall squats; 5x 30s on/off exercises, single leg ball toss, triceps wall push ups, superman    Consulted and Agree with Plan of Care  Patient       Patient will benefit from skilled therapeutic intervention in order to improve the following deficits and impairments:  Difficulty walking, Decreased endurance, Decreased activity tolerance, Pain, Improper body mechanics, Decreased balance, Decreased strength, Postural dysfunction  Visit Diagnosis: Difficulty in walking, not elsewhere classified  Muscle weakness (generalized)     Problem List Patient Active Problem List   Diagnosis Date Noted  . TOA (tubo-ovarian abscess) 07/24/2019  .  Tubo-ovarian abscess 07/20/2019  . Abdominal pain 07/20/2019  . Physical deconditioning 05/14/2019  .  PID (pelvic inflammatory disease) 05/14/2019  . Small bowel obstruction ([place])   . Pelvic mass 05/02/2019  . Elevated glucose 11/29/2018  . Class 1 obesity with serious comorbidity and body mass index (BMI) of 30.0 to 30.9 in adult 11/29/2018   Gwendolyn Nguyen C. Strummer Canipe PT, DPT 11/08/19 5:12 PM   [place] [facility] [address] [place], [place], [address] Phone: [phone]   Fax:  [phone]  Name: Adara Kittle MRN: [id number] Date of Birth: Jan 18, 1986

## 2019-11-10 ENCOUNTER — Other Ambulatory Visit: Payer: Self-pay

## 2019-11-10 ENCOUNTER — Encounter: Payer: Self-pay | Admitting: Physical Therapy

## 2019-11-10 ENCOUNTER — Ambulatory Visit: Payer: BC Managed Care – PPO | Admitting: Physical Therapy

## 2019-11-10 DIAGNOSIS — M6281 Muscle weakness (generalized): Secondary | ICD-10-CM

## 2019-11-10 DIAGNOSIS — R262 Difficulty in walking, not elsewhere classified: Secondary | ICD-10-CM | POA: Diagnosis not present

## 2019-11-10 NOTE — Therapy (Signed)
[place], [place], [address] Phone: [phone]   Fax:  [phone]  Physical Therapy Treatment  Patient Details  Name: Gwendolyn Nguyen: [id number] Date of Birth: February 07, 1986 Referring Provider (PT): Forrest Moron MD   Encounter Date: 11/10/2019  PT End of Session - 11/10/19 1628    Visit Number  22    Number of Visits  34    Date for PT Re-Evaluation  11/18/19    Authorization Type  BCBS    PT Start Time  1628    PT Stop Time  1707    PT Time Calculation (min)  39 min    Activity Tolerance  Patient tolerated treatment well    Behavior During Therapy  [facility] for tasks assessed/performed       Past Medical History:  Diagnosis Date  . Anemia   . Back pain   . Constipation   . Endometriosis   . IBS (irritable bowel syndrome)   . Low hemoglobin   . Multiple food allergies   . PID (acute pelvic inflammatory disease) 04/1218   complicated by tuboovarian abscess    Past Surgical History:  Procedure Laterality Date  . IR RADIOLOGIST EVAL & MGMT  05/19/2019  . IR RADIOLOGIST EVAL & MGMT  05/24/2019  . LAPAROSCOPIC LYSIS OF ADHESIONS N/A 07/24/2019   Procedure: Laparoscopic Lysis Of Adhesions, Drainage of Pelvic Abscess;  Surgeon: Servando Salina, MD;  Location: [place];  Service: Gynecology;  Laterality: N/A;  . LAPAROSCOPIC LYSIS OF ADHESIONS N/A 07/24/2019   Procedure: Laparoscopic Lysis Of Adhesions and Assist With Drainage of Pelvic Abscess;  Surgeon: Georganna Skeans, MD;  Location: [place];  Service: General;  Laterality: N/A;  . LAPAROSCOPY N/A 07/24/2019   Procedure: LAPAROSCOPY DIAGNOSTIC;  Surgeon: Servando Salina, MD;  Location: [place];  Service: Gynecology;  Laterality: N/A;    There were no vitals filed for this visit.  Subjective Assessment - 11/10/19 1631    Subjective  Doing okay overall. Work is stressful.    Currently in Pain?  No/denies                        [facility] Adult PT Treatment/Exercise - 11/10/19 0001      Lumbar Exercises: Aerobic   Elliptical  5 min L1 ramp 1      Lumbar Exercises: Standing   Lifting Limitations  T with 5lb kettle bell    Other Standing Lumbar Exercises  obstacle stepping fwd & side stepping    Other Standing Lumbar Exercises  SLS rebounder, 2 min of jumping on trampoline               PT Short Term Goals - 11/01/19 1624      PT SHORT TERM GOAL #1   Title  Pt will demo at least 1200 ft for 6MWT    Baseline  1064 ft today, improved from 615 ft at eval    Status  Not Met      PT SHORT TERM GOAL #2   Title  able to demo SLS at least 5s each side    Baseline  20 sec bilat.    Status  Achieved      PT SHORT TERM GOAL #3   Title  Pt will demo good form with sit<>stand    Status  Achieved        PT Long Term Goals - 09/12/19 1435      PT  LONG TERM GOAL #1   Title  pt will be able to navigate her stairs step over step wihtout difficulty    Baseline  unable to perform with reciprocal gait    Time  10    Period  Weeks    Status  On-going      PT LONG TERM GOAL #2   Title  6MWT at least 2000 ft    Baseline  760 feet    Time  10    Period  Weeks    Status  On-going      PT LONG TERM GOAL #3   Title  5TSTS <10s    Baseline  23 seconds    Time  10    Period  Weeks    Status  On-going      PT LONG TERM GOAL #4   Title  Pt will return to challenging HEP that will be carried on to long term exercise program    Baseline  unable    Time  10    Period  Weeks    Status  On-going      PT LONG TERM GOAL #5   Title  pt will be able to complete all household chores independently without limitaiton by fatigue or need for family member assistance    Baseline  still limited/needs intermittent assist from family    Time  10    Period  Weeks    Status  On-going            Plan - 11/10/19 1701    Clinical Impression Statement  Got HR up with eliptical and  followed twith balance and stepping challenges for control. Pt was able to jump on trampoline for just short of 2 min before fatigue and HR climbed to 170.    PT Treatment/Interventions  ADLs/Self Care Home Management;Cryotherapy;Electrical Stimulation;Ultrasound;Moist Heat;Iontophoresis 12m/ml Dexamethasone;Traction;Gait training;Stair training;Functional mobility training;Therapeutic activities;Balance training;Therapeutic exercise;Patient/family education;Neuromuscular re-education;Manual techniques;Passive range of motion;Dry needling;Spinal Manipulations;Taping    PT Next Visit Plan  stairs, stool scoots, prone exercises    PT Home Exercise Plan  move during TV commercials, step ups, partial squat, TrA, marching, bridge with ball, clam & reverse, anchored bands on physioball, primal push ups, heel raises, slow marching, wall squats; 5x 30s on/off exercises, single leg ball toss, triceps wall push ups, superman    Consulted and Agree with Plan of Care  Patient       Patient will benefit from skilled therapeutic intervention in order to improve the following deficits and impairments:  Difficulty walking, Decreased endurance, Decreased activity tolerance, Pain, Improper body mechanics, Decreased balance, Decreased strength, Postural dysfunction  Visit Diagnosis: Difficulty in walking, not elsewhere classified  Muscle weakness (generalized)     Problem List Patient Active Problem List   Diagnosis Date Noted  . TOA (tubo-ovarian abscess) 07/24/2019  . Tubo-ovarian abscess 07/20/2019  . Abdominal pain 07/20/2019  . Physical deconditioning 05/14/2019  . PID (pelvic inflammatory disease) 05/14/2019  . Small bowel obstruction ([facility]   . Pelvic mass 05/02/2019  . Elevated glucose 11/29/2018  . Class 1 obesity with serious comorbidity and body mass index (BMI) of 30.0 to 30.9 in adult 11/29/2018    Sheryll Dymek C. Srinivas Lippman PT, DPT 11/10/19 5:08 PM   [facility] [address]Phone: [phone]  Fax:  [phone] Name: Gwendolyn JimersonMRN: [id number] of Birth: [birth year]

## 2019-11-14 ENCOUNTER — Encounter: Payer: Self-pay | Admitting: Family Medicine

## 2019-11-14 DIAGNOSIS — G8929 Other chronic pain: Secondary | ICD-10-CM

## 2019-11-15 ENCOUNTER — Other Ambulatory Visit: Payer: Self-pay

## 2019-11-15 ENCOUNTER — Encounter: Payer: Self-pay | Admitting: Physical Therapy

## 2019-11-15 ENCOUNTER — Ambulatory Visit: Payer: BC Managed Care – PPO | Admitting: Physical Therapy

## 2019-11-15 DIAGNOSIS — R262 Difficulty in walking, not elsewhere classified: Secondary | ICD-10-CM

## 2019-11-15 DIAGNOSIS — M6281 Muscle weakness (generalized): Secondary | ICD-10-CM

## 2019-11-16 ENCOUNTER — Encounter: Payer: Self-pay | Admitting: Physical Therapy

## 2019-11-16 NOTE — Therapy (Signed)
[place], [place], [address] Phone: [phone]   Fax:  [phone]  Physical Therapy Treatment  Patient Details  Name: Gwendolyn Nguyen MRN: [id number] Date of Birth: [birth year] Referring Provider (PT): Forrest Moron MD   Encounter Date: 11/15/2019  PT End of Session - 11/15/19 1633    Visit Number  23    Number of Visits  34    Date for PT Re-Evaluation  11/18/19    Authorization Type  BCBS    PT Start Time  1633    PT Stop Time  1715    PT Time Calculation (min)  42 min    Activity Tolerance  Patient tolerated treatment well    Behavior During Therapy  [facility] for tasks assessed/performed       Past Medical History:  Diagnosis Date  . Anemia   . Back pain   . Constipation   . Endometriosis   . IBS (irritable bowel syndrome)   . Low hemoglobin   . Multiple food allergies   . PID (acute pelvic inflammatory disease) 86/7672   complicated by tuboovarian abscess    Past Surgical History:  Procedure Laterality Date  . IR RADIOLOGIST EVAL & MGMT  05/19/2019  . IR RADIOLOGIST EVAL & MGMT  05/24/2019  . LAPAROSCOPIC LYSIS OF ADHESIONS N/A 07/24/2019   Procedure: Laparoscopic Lysis Of Adhesions, Drainage of Pelvic Abscess;  Surgeon: Servando Salina, MD;  Location: [place];  Service: Gynecology;  Laterality: N/A;  . LAPAROSCOPIC LYSIS OF ADHESIONS N/A 07/24/2019   Procedure: Laparoscopic Lysis Of Adhesions and Assist With Drainage of Pelvic Abscess;  Surgeon: Georganna Skeans, MD;  Location: [place];  Service: General;  Laterality: N/A;  . LAPAROSCOPY N/A 07/24/2019   Procedure: LAPAROSCOPY DIAGNOSTIC;  Surgeon: Servando Salina, MD;  Location: [place];  Service: Gynecology;  Laterality: N/A;    There were no vitals filed for this visit.  Subjective Assessment - 11/15/19 1634    Subjective  Hurting a little. Trying to find foods that are irritating.                                PT Education - 11/15/19 1713    Education Details  TPDN & expected outcomes. pelvic floor rehab & anatomy of condition    Person(s) Educated  Patient    Methods  Explanation    Comprehension  Verbalized understanding;Need further instruction       PT Short Term Goals - 11/01/19 1624      PT SHORT TERM GOAL #1   Title  Pt will demo at least 1200 ft for 6MWT    Baseline  1064 ft today, improved from 615 ft at eval    Status  Not Met      PT SHORT TERM GOAL #2   Title  able to demo SLS at least 5s each side    Baseline  20 sec bilat.    Status  Achieved      PT SHORT TERM GOAL #3   Title  Pt will demo good form with sit<>stand    Status  Achieved        PT Long Term Goals - 09/12/19 1435      PT LONG TERM GOAL #1   Title  pt will be able to navigate her stairs step over step wihtout difficulty    Baseline  unable to perform with reciprocal gait  Time  10    Period  Weeks    Status  On-going      PT LONG TERM GOAL #2   Title  6MWT at least 2000 ft    Baseline  760 feet    Time  10    Period  Weeks    Status  On-going      PT LONG TERM GOAL #3   Title  5TSTS <10s    Baseline  23 seconds    Time  10    Period  Weeks    Status  On-going      PT LONG TERM GOAL #4   Title  Pt will return to challenging HEP that will be carried on to long term exercise program    Baseline  unable    Time  10    Period  Weeks    Status  On-going      PT LONG TERM GOAL #5   Title  pt will be able to complete all household chores independently without limitaiton by fatigue or need for family member assistance    Baseline  still limited/needs intermittent assist from family    Time  10    Period  Weeks    Status  On-going            Plan - 11/16/19 0810    Clinical Impression Statement  Increased pain with low level abdominal strengthening today. Able to palpate trigger points in rectus abdominis that recreated  concordant pain and was reduced with TPDN. Continued pain near pubic symphysis that became more noticable after releasing rectus and has felt in the past especially with gas. Due to history of abdominal interventions I do believe pt would benefit from pelvic floor therapy to decrease spasm and help improve stability. I have contacted our pelvic floor specialists.    PT Treatment/Interventions  ADLs/Self Care Home Management;Cryotherapy;Electrical Stimulation;Ultrasound;Moist Heat;Iontophoresis 15m/ml Dexamethasone;Traction;Gait training;Stair training;Functional mobility training;Therapeutic activities;Balance training;Therapeutic exercise;Patient/family education;Neuromuscular re-education;Manual techniques;Passive range of motion;Dry needling;Spinal Manipulations;Taping    PT Next Visit Plan  heard back from cheryl? outcome of TPDN? core stability on physioball    PT Home Exercise Plan  move during TV commercials, step ups, partial squat, TrA, marching, bridge with ball, clam & reverse, anchored bands on physioball, primal push ups, heel raises, slow marching, wall squats; 5x 30s on/off exercises, single leg ball toss, triceps wall push ups, superman    Consulted and Agree with Plan of Care  Patient       Patient will benefit from skilled therapeutic intervention in order to improve the following deficits and impairments:  Difficulty walking, Decreased endurance, Decreased activity tolerance, Pain, Improper body mechanics, Decreased balance, Decreased strength, Postural dysfunction  Visit Diagnosis: Difficulty in walking, not elsewhere classified  Muscle weakness (generalized)     Problem List Patient Active Problem List   Diagnosis Date Noted  . TOA (tubo-ovarian abscess) 07/24/2019  . Tubo-ovarian abscess 07/20/2019  . Abdominal pain 07/20/2019  . Physical deconditioning 05/14/2019  . PID (pelvic inflammatory disease) 05/14/2019  . Small bowel obstruction ([facility]   . Pelvic mass  05/02/2019  . Elevated glucose 11/29/2018  . Class 1 obesity with serious comorbidity and body mass index (BMI) of 30.0 to 30.9 in adult 11/29/2018    Danis Pembleton C. Jaykob Minichiello PT, DPT 11/16/19 8:15 AM   [facility]GRosedale [place] [address]Phone: [phone]  Fax:  [phone] Name: Gwendolyn SarrattMRN: [id number]  of Birth: [birth year]

## 2019-11-17 ENCOUNTER — Encounter: Payer: Self-pay | Admitting: Physical Therapy

## 2019-11-17 ENCOUNTER — Other Ambulatory Visit: Payer: Self-pay

## 2019-11-17 ENCOUNTER — Ambulatory Visit: Payer: BC Managed Care – PPO | Admitting: Physical Therapy

## 2019-11-17 DIAGNOSIS — M6281 Muscle weakness (generalized): Secondary | ICD-10-CM

## 2019-11-17 DIAGNOSIS — R262 Difficulty in walking, not elsewhere classified: Secondary | ICD-10-CM

## 2019-11-17 NOTE — Therapy (Signed)
[place], [place], [address] Phone: [phone]   Fax:  [phone]  Physical Therapy Treatment  Patient Details  Name: Gwendolyn Nguyen MRN: [id number] Date of Birth: Jun 19, 1986 Referring Provider (PT): Forrest Moron MD   Encounter Date: 11/17/2019  PT End of Session - 11/17/19 1625    Visit Number  24    Number of Visits  34    Date for PT Re-Evaluation  11/18/19    Authorization Type  BCBS    PT Start Time  1624    PT Stop Time  1713    PT Time Calculation (min)  49 min    Activity Tolerance  Patient tolerated treatment well    Behavior During Therapy  [facility] for tasks assessed/performed       Past Medical History:  Diagnosis Date  . Anemia   . Back pain   . Constipation   . Endometriosis   . IBS (irritable bowel syndrome)   . Low hemoglobin   . Multiple food allergies   . PID (acute pelvic inflammatory disease) 19/6222   complicated by tuboovarian abscess    Past Surgical History:  Procedure Laterality Date  . IR RADIOLOGIST EVAL & MGMT  05/19/2019  . IR RADIOLOGIST EVAL & MGMT  05/24/2019  . LAPAROSCOPIC LYSIS OF ADHESIONS N/A 07/24/2019   Procedure: Laparoscopic Lysis Of Adhesions, Drainage of Pelvic Abscess;  Surgeon: Servando Salina, MD;  Location: [place];  Service: Gynecology;  Laterality: N/A;  . LAPAROSCOPIC LYSIS OF ADHESIONS N/A 07/24/2019   Procedure: Laparoscopic Lysis Of Adhesions and Assist With Drainage of Pelvic Abscess;  Surgeon: Georganna Skeans, MD;  Location: [place];  Service: General;  Laterality: N/A;  . LAPAROSCOPY N/A 07/24/2019   Procedure: LAPAROSCOPY DIAGNOSTIC;  Surgeon: Servando Salina, MD;  Location: [place];  Service: Gynecology;  Laterality: N/A;    There were no vitals filed for this visit.  Subjective Assessment - 11/17/19 1626    Subjective  Still feel a little soreness in Rt rectus but nothing on Lt. Is scheduled to start with pelvic floor therapy.    Patient Stated Goals  sleep, workout, daily activities    Currently in Pain?  No/denies         [facility] PT Assessment - 11/17/19 0001      6 minute walk test results    Aerobic Endurance Distance Walked  9798    Endurance additional comments  no rest, end of session                   [place] Adult PT Treatment/Exercise - 11/17/19 0001      Lumbar Exercises: Aerobic   Stationary Bike  3 min L3 cues for speed    Other Aerobic Exercise  stepper 4 min L3      Lumbar Exercises: Standing   Functional Squats Limitations  side step into squat 5lb kettle bell    Other Standing Lumbar Exercises  T-hinge reach for cone      Lumbar Exercises: Seated   Other Seated Lumbar Exercises  stool scoots               PT Short Term Goals - 11/01/19 1624      PT SHORT TERM GOAL #1   Title  Pt will demo at least 1200 ft for 6MWT    Baseline  1064 ft today, improved from 615 ft at eval    Status  Not Met  PT SHORT TERM GOAL #2   Title  able to demo SLS at least 5s each side    Baseline  20 sec bilat.    Status  Achieved      PT SHORT TERM GOAL #3   Title  Pt will demo good form with sit<>stand    Status  Achieved        PT Long Term Goals - 09/12/19 1435      PT LONG TERM GOAL #1   Title  pt will be able to navigate her stairs step over step wihtout difficulty    Baseline  unable to perform with reciprocal gait    Time  10    Period  Weeks    Status  On-going      PT LONG TERM GOAL #2   Title  6MWT at least 2000 ft    Baseline  760 feet    Time  10    Period  Weeks    Status  On-going      PT LONG TERM GOAL #3   Title  5TSTS <10s    Baseline  23 seconds    Time  10    Period  Weeks    Status  On-going      PT LONG TERM GOAL #4   Title  Pt will return to challenging HEP that will be carried on to long term exercise program    Baseline  unable    Time  10    Period  Weeks    Status  On-going      PT LONG TERM GOAL #5   Title  pt will be able to  complete all household chores independently without limitaiton by fatigue or need for family member assistance    Baseline  still limited/needs intermittent assist from family    Time  10    Period  Weeks    Status  On-going            Plan - 11/17/19 1656    Clinical Impression Statement  HR to 150 on bike. Even with fatigue, pt was able to set a new personal record in her 6MWT. Will be transitioning to pelvic floor rehab next week and will do ERO.    PT Treatment/Interventions  ADLs/Self Care Home Management;Cryotherapy;Electrical Stimulation;Ultrasound;Moist Heat;Iontophoresis 57m/ml Dexamethasone;Traction;Gait training;Stair training;Functional mobility training;Therapeutic activities;Balance training;Therapeutic exercise;Patient/family education;Neuromuscular re-education;Manual techniques;Passive range of motion;Dry needling;Spinal Manipulations;Taping    PT Home Exercise Plan  move during TV commercials, step ups, partial squat, TrA, marching, bridge with ball, clam & reverse, anchored bands on physioball, primal push ups, heel raises, slow marching, wall squats; 5x 30s on/off exercises, single leg ball toss, triceps wall push ups, superman    Consulted and Agree with Plan of Care  Patient       Patient will benefit from skilled therapeutic intervention in order to improve the following deficits and impairments:  Difficulty walking, Decreased endurance, Decreased activity tolerance, Pain, Improper body mechanics, Decreased balance, Decreased strength, Postural dysfunction  Visit Diagnosis: Difficulty in walking, not elsewhere classified  Muscle weakness (generalized)     Problem List Patient Active Problem List   Diagnosis Date Noted  . TOA (tubo-ovarian abscess) 07/24/2019  . Tubo-ovarian abscess 07/20/2019  . Abdominal pain 07/20/2019  . Physical deconditioning 05/14/2019  . PID (pelvic inflammatory disease) 05/14/2019  . Small bowel obstruction ([place]   . Pelvic mass  05/02/2019  . Elevated glucose 11/29/2018  . Class 1 obesity with serious comorbidity  and body mass index (BMI) of 30.0 to 30.9 in adult 11/29/2018   Kirill Chatterjee C. Deepika Decatur PT, DPT 11/17/19 5:21 PM   [facility] [address] [place], [place], [address] Phone: [phone]   Fax:  [phone]  Name: Danila Eddie MRN: [id number] Date of Birth: 09/23/1986

## 2019-11-22 ENCOUNTER — Encounter: Payer: Self-pay | Admitting: Physical Therapy

## 2019-11-22 ENCOUNTER — Ambulatory Visit: Payer: BC Managed Care – PPO | Attending: Family Medicine | Admitting: Physical Therapy

## 2019-11-22 ENCOUNTER — Other Ambulatory Visit: Payer: Self-pay

## 2019-11-22 DIAGNOSIS — R262 Difficulty in walking, not elsewhere classified: Secondary | ICD-10-CM | POA: Diagnosis present

## 2019-11-22 DIAGNOSIS — M6281 Muscle weakness (generalized): Secondary | ICD-10-CM | POA: Diagnosis present

## 2019-11-22 NOTE — Therapy (Signed)
[facility] [address] [place], [place], [address] Phone: [phone]   Fax:  [phone]  Physical Therapy Evaluation  Patient Details  Name: Gwendolyn Nguyen MRN: [id number] Date of Birth: 11/28/1985 Referring Provider (PT): Forrest Moron MD   Encounter Date: 11/22/2019  PT End of Session - 11/22/19 1557    Visit Number  25    Number of Visits  34    Date for PT Re-Evaluation  01/17/20    Authorization Type  BCBS    PT Start Time  1536    PT Stop Time  9371    PT Time Calculation (min)  38 min    Activity Tolerance  Patient tolerated treatment well    Behavior During Therapy  [facility] for tasks assessed/performed       Past Medical History:  Diagnosis Date  . Anemia   . Back pain   . Constipation   . Endometriosis   . IBS (irritable bowel syndrome)   . Low hemoglobin   . Multiple food allergies   . PID (acute pelvic inflammatory disease) 69/6789   complicated by tuboovarian abscess    Past Surgical History:  Procedure Laterality Date  . IR RADIOLOGIST EVAL & MGMT  05/19/2019  . IR RADIOLOGIST EVAL & MGMT  05/24/2019  . LAPAROSCOPIC LYSIS OF ADHESIONS N/A 07/24/2019   Procedure: Laparoscopic Lysis Of Adhesions, Drainage of Pelvic Abscess;  Surgeon: Servando Salina, MD;  Location: [place];  Service: Gynecology;  Laterality: N/A;  . LAPAROSCOPIC LYSIS OF ADHESIONS N/A 07/24/2019   Procedure: Laparoscopic Lysis Of Adhesions and Assist With Drainage of Pelvic Abscess;  Surgeon: Georganna Skeans, MD;  Location: [place];  Service: General;  Laterality: N/A;  . LAPAROSCOPY N/A 07/24/2019   Procedure: LAPAROSCOPY DIAGNOSTIC;  Surgeon: Servando Salina, MD;  Location: [place];  Service: Gynecology;  Laterality: N/A;    There were no vitals filed for this visit.   Subjective Assessment - 11/22/19 1539    Subjective  Pt states she is having pain in the lower abdomen.  The pain is so severe that she cannot function.   This occurs 1-2x/week when there is a 8/10    Patient Stated Goals  pain relief    Currently in Pain?  Yes    Pain Score  5     Pain Location  Abdomen    Pain Orientation  Lower    Pain Descriptors / Indicators  Radiating;Aching    Pain Type  Chronic pain    Pain Radiating Towards  sometimes down the inner thighs    Pain Onset  More than a month ago    Pain Frequency  Intermittent    Aggravating Factors   certain foods trigger bloating; when I should be on my cycle    Pain Relieving Factors  icing the abdomen; medication    Effect of Pain on Daily Activities  have to lay down when it is a bad day; has to call out of work    Multiple Pain Sites  No         [facility] PT Assessment - 11/23/19 0001      Assessment   Medical Diagnosis  physical deconditioning    Referring Provider (PT)  Delia Chimes A MD    Onset Date/Surgical Date  05/02/19      Home Environment   Living Environment  Private residence    Living Arrangements  Alone      Posture/Postural Control  Posture Comments  rounded shoulders      Strength   Overall Strength Comments  hip abduction 4-/5; adduction 4-/5; all others 4/5                Objective measurements completed on examination: See above findings.    Pelvic Floor Special Questions - 11/23/19 0001    Prior Pelvic/Prostate Exam  Yes    Prior Pregnancies  No    Urinary Leakage  Yes    Activities that cause leaking  With strong urge    Urinary urgency  Yes    Fecal incontinence  No   constipation   Pelvic Floor Internal Exam  pt identity confirmed and informed consent given to perform internal soft tissue assessment    Exam Type  Vaginal    Palpation  tight and TTP bulbo and ischiocav    Strength  weak squeeze, no lift    Strength # of seconds  1   fading after one second   Tone  high       [facility] Adult PT Treatment/Exercise - 11/23/19 0001      Manual Therapy   Manual Therapy  Myofascial release    Myofascial Release  bladder and  vaginal canal release from abdomen; tension Rt side>Lt               PT Short Term Goals - 11/01/19 1624      PT SHORT TERM GOAL #1   Title  Pt will demo at least 1200 ft for 6MWT    Baseline  1064 ft today, improved from 615 ft at eval    Status  Not Met      PT SHORT TERM GOAL #2   Title  able to demo SLS at least 5s each side    Baseline  20 sec bilat.    Status  Achieved      PT SHORT TERM GOAL #3   Title  Pt will demo good form with sit<>stand    Status  Achieved        PT Long Term Goals - 11/23/19 1426      PT LONG TERM GOAL #1   Title  pt will be able to navigate her stairs step over step wihtout difficulty    Baseline  unable to perform with reciprocal gait    Time  8    Period  Weeks    Status  On-going    Target Date  01/17/20      PT LONG TERM GOAL #2   Title  6MWT at least 1300 ft    Baseline  760 feet    Time  8    Period  Weeks    Status  On-going    Target Date  01/17/20      PT LONG TERM GOAL #3   Title  5TSTS <10s    Baseline  23 seconds    Time  8    Period  Weeks    Status  On-going      PT LONG TERM GOAL #4   Title  Pt will return to challenging HEP that will be carried on to long term exercise program    Baseline  unable    Time  8    Period  Weeks    Status  On-going    Target Date  01/17/20      PT LONG TERM GOAL #5   Title  pt will be able to complete all household  chores independently without limitaiton by pain    Baseline  pain limits function about 2x/week    Time  8    Period  Weeks    Status  Revised    Target Date  01/17/20             Plan - 11/23/19 1434    Clinical Impression Statement  Pt has multiple systems and comorbidities that have effected the speed of progress.  She has made good progress with increased strength and endurance, but still demonstrates deficits as mentioned above. She has now been re-assessed today and including pelvic floor in today's assessment due to pelvic issues that are  causing increased pain and preventing her from having maximum return to functional activities.  pt will benefit from continued skilled PT at this time for maximum functional outcomes.    Personal Factors and Comorbidities  Comorbidity 3+    Comorbidities  multiple internal processes affected by infection, IC; endometriosis    Examination-Activity Limitations  Bathing;Locomotion Level;Bed Mobility;Bend;Sit;Carry;Sleep;Squat;Dressing;Stairs;Stand;Lift    Examination-Participation Restrictions  Meal Prep;Cleaning;Community Activity;Laundry;Shop    Rehab Potential  Excellent    PT Frequency  2x / week    PT Duration  8 weeks    PT Treatment/Interventions  ADLs/Self Care Home Management;Cryotherapy;Electrical Stimulation;Ultrasound;Moist Heat;Iontophoresis 81m/ml Dexamethasone;Traction;Gait training;Stair training;Functional mobility training;Therapeutic activities;Balance training;Therapeutic exercise;Patient/family education;Neuromuscular re-education;Manual techniques;Passive range of motion;Dry needling;Spinal Manipulations;Taping;Aquatic Therapy;Biofeedback    PT Next Visit Plan  myofascial release and biofeedback; relaxation and bulging pelvic floor    Consulted and Agree with Plan of Care  Patient       Patient will benefit from skilled therapeutic intervention in order to improve the following deficits and impairments:  Difficulty walking, Decreased endurance, Decreased activity tolerance, Pain, Improper body mechanics, Decreased balance, Decreased strength, Postural dysfunction  Visit Diagnosis: Difficulty in walking, not elsewhere classified  Muscle weakness (generalized)     Problem List Patient Active Problem List   Diagnosis Date Noted  . TOA (tubo-ovarian abscess) 07/24/2019  . Tubo-ovarian abscess 07/20/2019  . Abdominal pain 07/20/2019  . Physical deconditioning 05/14/2019  . PID (pelvic inflammatory disease) 05/14/2019  . [place]   . Pelvic mass  05/02/2019  . Elevated glucose 11/29/2018  . Class 1 obesity with serious comorbidity and body mass index (BMI) of 30.0 to 30.9 in adult 11/29/2018    JCamillo FlamingDesenglau, PT 11/23/2019, 2:40 PM  [facility] [address] [place] [address]Phone: [phone]  Fax:  [phone] Name: LAndriea HasegawaMRN: [id number] of Birth: 5Oct 05, 1987

## 2019-11-23 ENCOUNTER — Ambulatory Visit: Payer: BC Managed Care – PPO | Admitting: Physical Therapy

## 2019-11-24 ENCOUNTER — Ambulatory Visit: Payer: BC Managed Care – PPO | Admitting: Physical Therapy

## 2019-11-24 ENCOUNTER — Other Ambulatory Visit: Payer: Self-pay

## 2019-11-24 ENCOUNTER — Encounter: Payer: Self-pay | Admitting: Physical Therapy

## 2019-11-24 DIAGNOSIS — M6281 Muscle weakness (generalized): Secondary | ICD-10-CM

## 2019-11-24 DIAGNOSIS — R262 Difficulty in walking, not elsewhere classified: Secondary | ICD-10-CM

## 2019-11-24 MED ORDER — CLONAZEPAM 0.5 MG PO TABS: 0.5000 mg | ORAL_TABLET | Freq: Every day | ORAL | 1 refills | Status: DC

## 2019-11-24 MED ORDER — TRAMADOL HCL 50 MG PO TABS: 50.0000 mg | ORAL_TABLET | Freq: Three times a day (TID) | ORAL | 0 refills | Status: DC | PRN

## 2019-11-24 NOTE — Therapy (Signed)
[facility] [address], [place], [place], [address] Phone: [phone]   Fax:  [phone]  Physical Therapy Treatment  Patient Details  Name: Gwendolyn Nguyen MRN: [id number] Date of Birth: [birth year] Referring Provider (PT): Forrest Moron MD   Encounter Date: 11/24/2019  PT End of Session - 11/24/19 1400    Visit Number  26    Date for PT Re-Evaluation  01/17/20    Authorization Type  BCBS    PT Start Time  1400    PT Stop Time  1440    PT Time Calculation (min)  40 min    Activity Tolerance  Patient tolerated treatment well    Behavior During Therapy  [facility] for tasks assessed/performed       Past Medical History:  Diagnosis Date  . Anemia   . Back pain   . Constipation   . Endometriosis   . IBS (irritable bowel syndrome)   . Low hemoglobin   . Multiple food allergies   . PID (acute pelvic inflammatory disease) 83/2549   complicated by tuboovarian abscess    Past Surgical History:  Procedure Laterality Date  . IR RADIOLOGIST EVAL & MGMT  05/19/2019  . IR RADIOLOGIST EVAL & MGMT  05/24/2019  . LAPAROSCOPIC LYSIS OF ADHESIONS N/A 07/24/2019   Procedure: Laparoscopic Lysis Of Adhesions, Drainage of Pelvic Abscess;  Surgeon: Servando Salina, MD;  Location: [place];  Service: Gynecology;  Laterality: N/A;  . LAPAROSCOPIC LYSIS OF ADHESIONS N/A 07/24/2019   Procedure: Laparoscopic Lysis Of Adhesions and Assist With Drainage of Pelvic Abscess;  Surgeon: Georganna Skeans, MD;  Location: [place];  Service: General;  Laterality: N/A;  . LAPAROSCOPY N/A 07/24/2019   Procedure: LAPAROSCOPY DIAGNOSTIC;  Surgeon: Servando Salina, MD;  Location: [place];  Service: Gynecology;  Laterality: N/A;    There were no vitals filed for this visit.  Subjective Assessment - 11/24/19 1451    Subjective  I had to call out yesterday.  Still in some pain today.    Currently in Pain?  Yes    Pain Score  7     Pain Location  Abdomen     Multiple Pain Sites  No                       [facility] Adult PT Treatment/Exercise - 11/24/19 0001      Neuro Re-ed    Neuro Re-ed Details   diaphragmatic breathing      Manual Therapy   Myofascial Release  bladder and vaginal canal release from abdomen; tension Lt>Rt; fascial release along ascneding and descending colon             PT Education - 11/24/19 1446    Education Details  Access Code: [email or web address]    Person(s) Educated  Patient    Methods  Explanation;Demonstration;Tactile cues;Verbal cues;Handout    Comprehension  Verbalized understanding;Returned demonstration       PT Short Term Goals - 11/01/19 1624      PT SHORT TERM GOAL #1   Title  Pt will demo at least 1200 ft for 6MWT    Baseline  1064 ft today, improved from 615 ft at eval    Status  Not Met      PT SHORT TERM GOAL #2   Title  able to demo SLS at least 5s each side    Baseline  20 sec bilat.    Status  Achieved  PT SHORT TERM GOAL #3   Title  Pt will demo good form with sit<>stand    Status  Achieved        PT Long Term Goals - 11/23/19 1426      PT LONG TERM GOAL #1   Title  pt will be able to navigate her stairs step over step wihtout difficulty    Baseline  unable to perform with reciprocal gait    Time  8    Period  Weeks    Status  On-going    Target Date  01/17/20      PT LONG TERM GOAL #2   Title  6MWT at least 1300 ft    Baseline  760 feet    Time  8    Period  Weeks    Status  On-going    Target Date  01/17/20      PT LONG TERM GOAL #3   Title  5TSTS <10s    Baseline  23 seconds    Time  8    Period  Weeks    Status  On-going      PT LONG TERM GOAL #4   Title  Pt will return to challenging HEP that will be carried on to long term exercise program    Baseline  unable    Time  8    Period  Weeks    Status  On-going    Target Date  01/17/20      PT LONG TERM GOAL #5   Title  pt will be able to complete all household chores independently without  limitaiton by pain    Baseline  pain limits function about 2x/week    Time  8    Period  Weeks    Status  Revised    Target Date  01/17/20            Plan - 11/24/19 1446    Clinical Impression Statement  Pt responded well to manual treatment and felt more relaxed.  Pt was able to understand how to perform diaphragmatic breathing.  Pt will benefit from skilled PT to continue to work on muscle contract and relax for improved function of pelvic floor.    PT Treatment/Interventions  ADLs/Self Care Home Management;Cryotherapy;Electrical Stimulation;Ultrasound;Moist Heat;Iontophoresis 58m/ml Dexamethasone;Traction;Gait training;Stair training;Functional mobility training;Therapeutic activities;Balance training;Therapeutic exercise;Patient/family education;Neuromuscular re-education;Manual techniques;Passive range of motion;Dry needling;Spinal Manipulations;Taping;Aquatic Therapy;Biofeedback    PT Next Visit Plan  biofeedback    PT Home Exercise Plan  Access Code: [email or web address]   Consulted and Agree with Plan of Care  Patient       Patient will benefit from skilled therapeutic intervention in order to improve the following deficits and impairments:  Difficulty walking, Decreased endurance, Decreased activity tolerance, Pain, Improper body mechanics, Decreased balance, Decreased strength, Postural dysfunction  Visit Diagnosis: Difficulty in walking, not elsewhere classified  Muscle weakness (generalized)     Problem List Patient Active Problem List   Diagnosis Date Noted  . TOA (tubo-ovarian abscess) 07/24/2019  . Tubo-ovarian abscess 07/20/2019  . Abdominal pain 07/20/2019  . Physical deconditioning 05/14/2019  . PID (pelvic inflammatory disease) 05/14/2019  . Small bowel obstruction ([place]   . Pelvic mass 05/02/2019  . Elevated glucose 11/29/2018  . Class 1 obesity with serious comorbidity and body mass index (BMI) of 30.0 to 30.9 in adult 11/29/2018    JCamillo FlamingDesenglau,  PT 11/24/2019, 2:52 PM  [place] [facility] [address]  [place], [place], [address] Phone: [phone]   Fax:  [phone]  Name: Gwendolyn Nguyen MRN: [id number] Date of Birth: 11-Jun-1986

## 2019-11-24 NOTE — Patient Instructions (Signed)
Access Code: [email or web address]  URL: [email or web address]  Date: 11/24/2019  Prepared by: Jari Favre   Exercises Supine Diaphragmatic Breathing with Pelvic Floor Lengthening - 10 reps - 1 sets - 3x daily - 7x weekly

## 2019-11-29 ENCOUNTER — Other Ambulatory Visit: Payer: Self-pay

## 2019-11-29 ENCOUNTER — Ambulatory Visit: Payer: BC Managed Care – PPO | Admitting: Family Medicine

## 2019-11-29 ENCOUNTER — Encounter: Payer: Self-pay | Admitting: Family Medicine

## 2019-11-29 VITALS — BP 133/80 | HR 85 | Temp 98.2°F | Ht 64.0 in | Wt 170.6 lb

## 2019-11-29 DIAGNOSIS — R109 Unspecified abdominal pain: Secondary | ICD-10-CM

## 2019-11-29 DIAGNOSIS — R5383 Other fatigue: Secondary | ICD-10-CM | POA: Diagnosis not present

## 2019-11-29 DIAGNOSIS — G8929 Other chronic pain: Secondary | ICD-10-CM

## 2019-11-29 DIAGNOSIS — R5381 Other malaise: Secondary | ICD-10-CM

## 2019-11-29 MED ORDER — CLONAZEPAM 0.5 MG PO TABS: 0.5000 mg | ORAL_TABLET | Freq: Every day | ORAL | 0 refills | Status: DC

## 2019-11-29 MED ORDER — TRAMADOL HCL 50 MG PO TABS: 50.0000 mg | ORAL_TABLET | Freq: Three times a day (TID) | ORAL | 0 refills | Status: AC | PRN

## 2019-11-29 NOTE — Progress Notes (Signed)
Established Patient Office Visit  Subjective:  Patient ID: Gwendolyn Nguyen, female    DOB: 1986-10-15  Age: 34 y.o. MRN: [id number]  CC:  Chief Complaint  Patient presents with  . Abdominal Pain    f/u. Hx of chronic abdominal pain. Still having abd pain mostly on the left flank area but general pain is all over abd area. Need a intermiting leave of abscent letter    HPI Gwendolyn Nguyen presents for   Follow up regarding her current ongoing PT for deconditioning as well as pelvic floor pain.  She reports that she has some fatigue.  She states that she is also working on improving her pelvic pain but that PT is still painful and continues   Past Medical History:  Diagnosis Date  . Anemia   . Back pain   . Constipation   . Endometriosis   . IBS (irritable bowel syndrome)   . Low hemoglobin   . Multiple food allergies   . PID (acute pelvic inflammatory disease) 99991111   complicated by tuboovarian abscess    Past Surgical History:  Procedure Laterality Date  . IR RADIOLOGIST EVAL & MGMT  05/19/2019  . IR RADIOLOGIST EVAL & MGMT  05/24/2019  . LAPAROSCOPIC LYSIS OF ADHESIONS N/A 07/24/2019   Procedure: Laparoscopic Lysis Of Adhesions, Drainage of Pelvic Abscess;  Surgeon: Servando Salina, MD;  Location: [place];  Service: Gynecology;  Laterality: N/A;  . LAPAROSCOPIC LYSIS OF ADHESIONS N/A 07/24/2019   Procedure: Laparoscopic Lysis Of Adhesions and Assist With Drainage of Pelvic Abscess;  Surgeon: Georganna Skeans, MD;  Location: [place];  Service: General;  Laterality: N/A;  . LAPAROSCOPY N/A 07/24/2019   Procedure: LAPAROSCOPY DIAGNOSTIC;  Surgeon: Servando Salina, MD;  Location: [place];  Service: Gynecology;  Laterality: N/A;    Family History  Problem Relation Age of Onset  . Hyperlipidemia Mother   . Hypertension Mother   . Obesity Mother   . Hyperlipidemia Father   . Hypertension Father   . Obesity Father   . Diabetes Maternal Grandmother     Social History    Socioeconomic History  . Marital status: Single    Spouse name: Not on file  . Number of children: Not on file  . Years of education: Not on file  . Highest education level: Not on file  Occupational History  . Occupation: Social worker  Tobacco Use  . Smoking status: Never Smoker  . Smokeless tobacco: Never Used  Substance and Sexual Activity  . Alcohol use: Yes    Comment: occassional  . Drug use: Never  . Sexual activity: Not on file  Other Topics Concern  . Not on file  Social History Narrative  . Not on file   Social Determinants of Health   Financial Resource Strain:   . Difficulty of Paying Living Expenses: Not on file  Food Insecurity:   . Worried About Charity fundraiser in the Last Year: Not on file  . Ran Out of Food in the Last Year: Not on file  Transportation Needs:   . Lack of Transportation (Medical): Not on file  . Lack of Transportation (Non-Medical): Not on file  Physical Activity:   . Days of Exercise per Week: Not on file  . Minutes of Exercise per Session: Not on file  Stress:   . Feeling of Stress : Not on file  Social Connections:   . Frequency of Communication with Friends and Family: Not on file  . Frequency of Social  Gatherings with Friends and Family: Not on file  . Attends Religious Services: Not on file  . Active Member of Clubs or Organizations: Not on file  . Attends Archivist Meetings: Not on file  . Marital Status: Not on file  Intimate Partner Violence:   . Fear of Current or Ex-Partner: Not on file  . Emotionally Abused: Not on file  . Physically Abused: Not on file  . Sexually Abused: Not on file    Outpatient Medications Prior to Visit  Medication Sig Dispense Refill  . cyclobenzaprine (FLEXERIL) 5 MG tablet Take 5 mg by mouth 3 (three) times daily as needed for muscle spasms.    . Iron-FA-B Cmp-C-Biot-Probiotic (FUSION PLUS) CAPS Take 1 capsule by mouth daily.    . norethindrone (AYGESTIN) 5 MG tablet Take 5 mg  by mouth daily.    . clonazePAM (KLONOPIN) 0.5 MG tablet Take 1 tablet (0.5 mg total) by mouth at bedtime. For GI symptoms. 30 tablet 1  . ferrous sulfate 325 (65 FE) MG tablet Take 325 mg by mouth daily with breakfast. FUSION PLUS 130 mg iron -1,250 mcg cap    . traMADol (ULTRAM) 50 MG tablet Take 1 tablet (50 mg total) by mouth every 8 (eight) hours as needed for up to 10 days. 30 tablet 0  . famotidine (PEPCID) 20 MG tablet TAKE 1 TABLET BY MOUTH TWICE A DAY 180 tablet 1  . letrozole (FEMARA) 2.5 MG tablet Take 2.5 mg by mouth daily.    . medroxyPROGESTERone (DEPO-PROVERA) 150 MG/ML injection Inject 150 mg into the muscle every 3 (three) months.    Gwendolyn Nguyen 150-35 MCG/24HR transdermal patch 1 patch once a week.     No facility-administered medications prior to visit.    Allergies  Allergen Reactions  . Gadolinium Derivatives Hives, Itching and Nausea And Vomiting    Pt vomited immediately during injection.  15 minutes later, pt developed hives and itching.   . Shellfish Allergy Hives  . Sulfa Antibiotics Other (See Comments)    Patient was told to NOT TAKE THIS  . Iohexol Hives and Rash    08-2018, rash and hives, needs pre meds S/W PATIENT STATED REACTION FROM CT CONTRAST 11/19    ROS Review of Systems Review of Systems  Constitutional: Negative for activity change, appetite change, chills and fever.  HENT: Negative for congestion, nosebleeds, trouble swallowing and voice change.   Respiratory: Negative for cough, shortness of breath and wheezing.   Gastrointestinal: Negative for diarrhea, nausea and vomiting.  Genitourinary: Negative for difficulty urinating, dysuria, flank pain and hematuria.  Neurological: Negative for dizziness, speech difficulty, light-headedness and numbness.  See HPI. All other review of systems negative.     Objective:    Physical Exam  BP 133/80 (BP Location: Right Arm, Patient Position: Sitting, Cuff Size: Normal)   Pulse 85   Temp 98.2 F (36.8  C) (Temporal)   Ht 5\' 4"  (1.626 m)   Wt 170 lb 9.6 oz (77.4 kg)   SpO2 100%   BMI 29.28 kg/m  Wt Readings from Last 3 Encounters:  11/29/19 170 lb 9.6 oz (77.4 kg)  10/26/19 170 lb 6.4 oz (77.3 kg)  10/03/19 164 lb (74.4 kg)   Physical Exam  Constitutional: Oriented to person, place, and time. Appears well-developed and well-nourished.  HENT:  Head: Normocephalic and atraumatic.  Eyes: Conjunctivae and EOM are normal.  Cardiovascular: Normal rate, regular rhythm, normal heart sounds and intact distal pulses.  No murmur heard.  Pulmonary/Chest: Effort normal and breath sounds normal. No stridor. No respiratory distress. Has no wheezes.  Neurological: Is alert and oriented to person, place, and time.  Skin: Skin is warm. Capillary refill takes less than 2 seconds.  Psychiatric: Has a normal mood and affect. Behavior is normal. Judgment and thought content normal.    There are no preventive care reminders to display for this patient.  There are no preventive care reminders to display for this patient.  Lab Results  Component Value Date   TSH 1.160 11/25/2018   Lab Results  Component Value Date   WBC 7.6 09/23/2019   HGB 11.3 09/23/2019   HCT 34.5 09/23/2019   MCV 84.4 09/23/2019   PLT 391 08/01/2019   Lab Results  Component Value Date   NA 128 (L) 08/03/2019   K 3.8 08/03/2019   CO2 21 (L) 08/03/2019   GLUCOSE 453 (H) 08/03/2019   BUN 10 08/03/2019   CREATININE 1.07 (H) 08/03/2019   BILITOT 0.4 07/20/2019   ALKPHOS 50 07/20/2019   AST 32 07/20/2019   ALT 40 07/20/2019   PROT 7.4 07/20/2019   ALBUMIN 3.8 07/20/2019   CALCIUM 8.1 (L) 08/03/2019   ANIONGAP 13 08/03/2019   GFR 78.11 06/09/2019   Lab Results  Component Value Date   CHOL 150 11/25/2018   Lab Results  Component Value Date   HDL 56 11/25/2018   Lab Results  Component Value Date   LDLCALC 86 11/25/2018   Lab Results  Component Value Date   TRIG 41 11/25/2018   No results found for:  CHOLHDL No results found for: HGBA1C    Assessment & Plan:   Problem List Items Addressed This Visit      Other   Physical deconditioning - Primary    Other Visit Diagnoses    Chronic abdominal pain       Relevant Medications   traMADol (ULTRAM) 50 MG tablet   clonazePAM (KLONOPIN) 0.5 MG tablet   Other fatigue         Gave FMLA Advised pt to follow up with PT Continue to take breaks and rest as needed   Meds ordered this encounter  Medications  . traMADol (ULTRAM) 50 MG tablet    Sig: Take 1 tablet (50 mg total) by mouth every 8 (eight) hours as needed for up to 10 days.    Dispense:  30 tablet    Refill:  0  . clonazePAM (KLONOPIN) 0.5 MG tablet    Sig: Take 1 tablet (0.5 mg total) by mouth at bedtime. For GI symptoms.    Dispense:  30 tablet    Refill:  0    Follow-up: No follow-ups on file.    Forrest Moron, MD

## 2019-11-29 NOTE — Patient Instructions (Signed)
° ° ° °  If you have lab work done today you will be contacted with your lab results within the next 2 weeks.  If you have not heard from us then please contact us. The fastest way to get your results is to register for My Chart. ° ° °IF you received an x-ray today, you will receive an invoice from [facility]. Please contact [facility] at [phone] with questions or concerns regarding your invoice.  ° °IF you received labwork today, you will receive an invoice from [facility]. Please contact [facility] at [phone] with questions or concerns regarding your invoice.  ° °Our billing staff will not be able to assist you with questions regarding bills from these companies. ° °You will be contacted with the lab results as soon as they are available. The fastest way to get your results is to activate your My Chart account. Instructions are located on the last page of this paperwork. If you have not heard from us regarding the results in 2 weeks, please contact this office. °  ° ° ° °

## 2019-11-30 ENCOUNTER — Encounter: Payer: BC Managed Care – PPO | Admitting: Physical Therapy

## 2019-12-01 ENCOUNTER — Ambulatory Visit: Payer: BC Managed Care – PPO | Admitting: Physical Therapy

## 2019-12-01 ENCOUNTER — Encounter: Payer: BC Managed Care – PPO | Admitting: Physical Therapy

## 2019-12-01 ENCOUNTER — Encounter: Payer: Self-pay | Admitting: Physical Therapy

## 2019-12-01 ENCOUNTER — Other Ambulatory Visit: Payer: Self-pay

## 2019-12-01 DIAGNOSIS — R262 Difficulty in walking, not elsewhere classified: Secondary | ICD-10-CM | POA: Diagnosis not present

## 2019-12-01 DIAGNOSIS — M6281 Muscle weakness (generalized): Secondary | ICD-10-CM

## 2019-12-01 NOTE — Therapy (Signed)
[facility] [name] [place], [place], [address] Phone: [phone]   Fax:  [phone]  Physical Therapy Treatment  Patient Details  Name: Gwendolyn Nguyen MRN: [id number] Date of Birth: 1985-11-21 Referring Provider (PT): Forrest Moron MD   Encounter Date: 12/01/2019  PT End of Session - 12/01/19 1532    Visit Number  27    Number of Visits  34    Date for PT Re-Evaluation  01/17/20    Authorization Type  BCBS    PT Start Time  1530    PT Stop Time  1612    PT Time Calculation (min)  42 min    Activity Tolerance  Patient tolerated treatment well    Behavior During Therapy  [facility] for tasks assessed/performed       Past Medical History:  Diagnosis Date  . Anemia   . Back pain   . Constipation   . Endometriosis   . IBS (irritable bowel syndrome)   . Low hemoglobin   . Multiple food allergies   . PID (acute pelvic inflammatory disease) 31/5176   complicated by tuboovarian abscess    Past Surgical History:  Procedure Laterality Date  . IR RADIOLOGIST EVAL & MGMT  05/19/2019  . IR RADIOLOGIST EVAL & MGMT  05/24/2019  . LAPAROSCOPIC LYSIS OF ADHESIONS N/A 07/24/2019   Procedure: Laparoscopic Lysis Of Adhesions, Drainage of Pelvic Abscess;  Surgeon: Servando Salina, MD;  Location: [place];  Service: Gynecology;  Laterality: N/A;  . LAPAROSCOPIC LYSIS OF ADHESIONS N/A 07/24/2019   Procedure: Laparoscopic Lysis Of Adhesions and Assist With Drainage of Pelvic Abscess;  Surgeon: Georganna Skeans, MD;  Location: [place];  Service: General;  Laterality: N/A;  . LAPAROSCOPY N/A 07/24/2019   Procedure: LAPAROSCOPY DIAGNOSTIC;  Surgeon: Servando Salina, MD;  Location: [place];  Service: Gynecology;  Laterality: N/A;    There were no vitals filed for this visit.  Subjective Assessment - 12/01/19 1615    Subjective  I am having a pretty good day.  Pain is mild.    Patient Stated Goals  pain relief                        [facility] Adult PT Treatment/Exercise - 12/01/19 0001      Neuro Re-ed    Neuro Re-ed Details   biofeedback: rest 1.6WV; quick flicks 37.1GG; 10 sec hold 12m; 20 sec hold 4.433m rest post test 1.95m85m    Lumbar Exercises: Supine   Other Supine Lumbar Exercises  ball squeeze with "s" sound - 4 sec hold and 4 sec rest using biofeedback for visual cue;                PT Short Term Goals - 11/01/19 1624      PT SHORT TERM GOAL #1   Title  Pt will demo at least 1200 ft for 6MWT    Baseline  1064 ft today, improved from 615 ft at eval    Status  Not Met      PT SHORT TERM GOAL #2   Title  able to demo SLS at least 5s each side    Baseline  20 sec bilat.    Status  Achieved      PT SHORT TERM GOAL #3   Title  Pt will demo good form with sit<>stand    Status  Achieved        PT Long Term  Goals - 11/23/19 1426      PT LONG TERM GOAL #1   Title  pt will be able to navigate her stairs step over step wihtout difficulty    Baseline  unable to perform with reciprocal gait    Time  8    Period  Weeks    Status  On-going    Target Date  01/17/20      PT LONG TERM GOAL #2   Title  6MWT at least 1300 ft    Baseline  760 feet    Time  8    Period  Weeks    Status  On-going    Target Date  01/17/20      PT LONG TERM GOAL #3   Title  5TSTS <10s    Baseline  23 seconds    Time  8    Period  Weeks    Status  On-going      PT LONG TERM GOAL #4   Title  Pt will return to challenging HEP that will be carried on to long term exercise program    Baseline  unable    Time  8    Period  Weeks    Status  On-going    Target Date  01/17/20      PT LONG TERM GOAL #5   Title  pt will be able to complete all household chores independently without limitaiton by pain    Baseline  pain limits function about 2x/week    Time  8    Period  Weeks    Status  Revised    Target Date  01/17/20            Plan - 12/01/19 1721    Clinical  Impression Statement  Pt demonstrates decreased endurance with strong contraction for up to 2 sec.  She improved with gradual build up.  Pt had some increased pain with internal hip rotation in supine but it went away after releaxing.  No increased pain during session it just moved to more centrally.  Pt had improved coordination of core and pelvic floor with doing "s" sound. Pt will benefit from skilled PT to continue working on core and pelvic floor coordination.    Examination-Activity Limitations  Bathing;Locomotion Level;Bed Mobility;Bend;Sit;Carry;Sleep;Squat;Dressing;Stairs;Stand;Lift    PT Treatment/Interventions  ADLs/Self Care Home Management;Cryotherapy;Electrical Stimulation;Ultrasound;Moist Heat;Iontophoresis 39m/ml Dexamethasone;Traction;Gait training;Stair training;Functional mobility training;Therapeutic activities;Balance training;Therapeutic exercise;Patient/family education;Neuromuscular re-education;Manual techniques;Passive range of motion;Dry needling;Spinal Manipulations;Taping;Aquatic Therapy;Biofeedback    PT Next Visit Plan  biofeedback core and pelvic floor    PT Home Exercise Plan  Access Code: [email or web address]   Consulted and Agree with Plan of Care  Patient       Patient will benefit from skilled therapeutic intervention in order to improve the following deficits and impairments:  Difficulty walking, Decreased endurance, Decreased activity tolerance, Pain, Improper body mechanics, Decreased balance, Decreased strength, Postural dysfunction  Visit Diagnosis: Difficulty in walking, not elsewhere classified  Muscle weakness (generalized)     Problem List Patient Active Problem List   Diagnosis Date Noted  . TOA (tubo-ovarian abscess) 07/24/2019  . Tubo-ovarian abscess 07/20/2019  . Abdominal pain 07/20/2019  . Physical deconditioning 05/14/2019  . PID (pelvic inflammatory disease) 05/14/2019  . Small bowel obstruction ([place]   . Pelvic mass 05/02/2019  . Elevated  glucose 11/29/2018  . Class 1 obesity with serious comorbidity and body mass index (BMI) of 30.0 to 30.9 in adult 11/29/2018    JJule Ser PT  12/01/2019, 5:27 PM  [facility] [address], [place], [place], [address] Phone: [phone]   Fax:  [phone]  Name: Gwendolyn Nguyen MRN: [id number] Date of Birth: Oct 16, 1986

## 2019-12-06 ENCOUNTER — Ambulatory Visit: Payer: BC Managed Care – PPO | Admitting: Physical Therapy

## 2019-12-06 ENCOUNTER — Encounter: Payer: BC Managed Care – PPO | Admitting: Physical Therapy

## 2019-12-06 ENCOUNTER — Encounter: Payer: Self-pay | Admitting: Physical Therapy

## 2019-12-06 ENCOUNTER — Other Ambulatory Visit: Payer: Self-pay

## 2019-12-06 DIAGNOSIS — R262 Difficulty in walking, not elsewhere classified: Secondary | ICD-10-CM

## 2019-12-06 DIAGNOSIS — M6281 Muscle weakness (generalized): Secondary | ICD-10-CM

## 2019-12-06 NOTE — Therapy (Signed)
[facility] [address] [place], [place], [address] Phone: [phone]   Fax:  [phone]  Physical Therapy Treatment  Patient Details  Name: Gwendolyn Nguyen MRN: [id number] Date of Birth: July 06, 1986 Referring Provider (PT): Forrest Moron MD   Encounter Date: 12/06/2019  PT End of Session - 12/06/19 1612    Visit Number  28    Number of Visits  34    Date for PT Re-Evaluation  01/17/20    Authorization Type  BCBS    PT Start Time  1612    PT Stop Time  3299    PT Time Calculation (min)  52 min    Activity Tolerance  Patient tolerated treatment well    Behavior During Therapy  [facility] for tasks assessed/performed       Past Medical History:  Diagnosis Date  . Anemia   . Back pain   . Constipation   . Endometriosis   . IBS (irritable bowel syndrome)   . Low hemoglobin   . Multiple food allergies   . PID (acute pelvic inflammatory disease) 24/2683   complicated by tuboovarian abscess    Past Surgical History:  Procedure Laterality Date  . IR RADIOLOGIST EVAL & MGMT  05/19/2019  . IR RADIOLOGIST EVAL & MGMT  05/24/2019  . LAPAROSCOPIC LYSIS OF ADHESIONS N/A 07/24/2019   Procedure: Laparoscopic Lysis Of Adhesions, Drainage of Pelvic Abscess;  Surgeon: Servando Salina, MD;  Location: [place];  Service: Gynecology;  Laterality: N/A;  . LAPAROSCOPIC LYSIS OF ADHESIONS N/A 07/24/2019   Procedure: Laparoscopic Lysis Of Adhesions and Assist With Drainage of Pelvic Abscess;  Surgeon: Georganna Skeans, MD;  Location: [place];  Service: General;  Laterality: N/A;  . LAPAROSCOPY N/A 07/24/2019   Procedure: LAPAROSCOPY DIAGNOSTIC;  Surgeon: Servando Salina, MD;  Location: [place];  Service: Gynecology;  Laterality: N/A;    There were no vitals filed for this visit.  Subjective Assessment - 12/06/19 1624    Subjective  I have been having a lot of pain.  My stomach feels like it bubbles when I push on it (Lt lower  quadrant).    Currently in Pain?  Yes    Pain Score  5     Pain Location  Abdomen    Pain Orientation  Lower    Pain Descriptors / Indicators  Sore;Pressure    Pain Type  Chronic pain    Pain Radiating Towards  pressure in mid stomach    Pain Onset  More than a month ago    Pain Frequency  Intermittent    Aggravating Factors   feel bloating    Pain Relieving Factors  ice    Multiple Pain Sites  No                       [facility] Adult PT Treatment/Exercise - 12/06/19 0001      Neuro Re-ed    Neuro Re-ed Details   bifoeedback used during session to help with visual cueing      Lumbar Exercises: Supine   Other Supine Lumbar Exercises  kegel in supine position - 4 sec hold/ 8 sec rest      Lumbar Exercises: Prone   Other Prone Lumbar Exercises  kegel with heel press - educated and perfomed      Lumbar Exercises: Quadruped   Other Quadruped Lumbar Exercises  pelvic and TrA brace - stationary and fwd/back rocking - educated and performed cues  to keep back straight - 4 sec hold; 8 sec rest      Manual Therapy   Manual therapy comments  supine on wedge pillow    Myofascial Release  fascial release along ascending and descending colon - midline of abdomen release to fascial band               PT Short Term Goals - 11/01/19 1624      PT SHORT TERM GOAL #1   Title  Pt will demo at least 1200 ft for 6MWT    Baseline  1064 ft today, improved from 615 ft at eval    Status  Not Met      PT SHORT TERM GOAL #2   Title  able to demo SLS at least 5s each side    Baseline  20 sec bilat.    Status  Achieved      PT SHORT TERM GOAL #3   Title  Pt will demo good form with sit<>stand    Status  Achieved        PT Long Term Goals - 11/23/19 1426      PT LONG TERM GOAL #1   Title  pt will be able to navigate her stairs step over step wihtout difficulty    Baseline  unable to perform with reciprocal gait    Time  8    Period  Weeks    Status  On-going    Target  Date  01/17/20      PT LONG TERM GOAL #2   Title  6MWT at least 1300 ft    Baseline  760 feet    Time  8    Period  Weeks    Status  On-going    Target Date  01/17/20      PT LONG TERM GOAL #3   Title  5TSTS <10s    Baseline  23 seconds    Time  8    Period  Weeks    Status  On-going      PT LONG TERM GOAL #4   Title  Pt will return to challenging HEP that will be carried on to long term exercise program    Baseline  unable    Time  8    Period  Weeks    Status  On-going    Target Date  01/17/20      PT LONG TERM GOAL #5   Title  pt will be able to complete all household chores independently without limitaiton by pain    Baseline  pain limits function about 2x/week    Time  8    Period  Weeks    Status  Revised    Target Date  01/17/20            Plan - 12/06/19 1711    Clinical Impression Statement  Pt demonstrates improved ability to engage pelvic floor and can feel it more in supine position.  Pt attempting other positions such as quadruped with rocking and had more difficulty feeling that.  Pt had tenderness around ascending and descending colon and responded well to MFR with more soft tissue pliability. Pt will benefit from skilled PT to continue working on core strength and muscle coordination.    Examination-Activity Limitations  Bathing;Locomotion Level;Bed Mobility;Bend;Sit;Carry;Sleep;Squat;Dressing;Stairs;Stand;Lift    PT Treatment/Interventions  ADLs/Self Care Home Management;Cryotherapy;Electrical Stimulation;Ultrasound;Moist Heat;Iontophoresis 29m/ml Dexamethasone;Traction;Gait training;Stair training;Functional mobility training;Therapeutic activities;Balance training;Therapeutic exercise;Patient/family education;Neuromuscular re-education;Manual techniques;Passive range of motion;Dry needling;Spinal Manipulations;Taping;Aquatic Therapy;Biofeedback  PT Next Visit Plan  f/u on MFR to abdomen from previous session; biofeedback core and pelvic floor    PT  Home Exercise Plan  Access Code: [email or web address]    Consulted and Agree with Plan of Care  Patient       Patient will benefit from skilled therapeutic intervention in order to improve the following deficits and impairments:  Difficulty walking, Decreased endurance, Decreased activity tolerance, Pain, Improper body mechanics, Decreased balance, Decreased strength, Postural dysfunction  Visit Diagnosis: Difficulty in walking, not elsewhere classified  Muscle weakness (generalized)     Problem List Patient Active Problem List   Diagnosis Date Noted  . TOA (tubo-ovarian abscess) 07/24/2019  . Tubo-ovarian abscess 07/20/2019  . Abdominal pain 07/20/2019  . Physical deconditioning 05/14/2019  . PID (pelvic inflammatory disease) 05/14/2019  . Small bowel obstruction ([place])   . Pelvic mass 05/02/2019  . Elevated glucose 11/29/2018  . Class 1 obesity with serious comorbidity and body mass index (BMI) of 30.0 to 30.9 in adult 11/29/2018    Camillo Flaming Rockney Grenz, PT 12/06/2019, 5:27 PM  [facility] [address] [place], [place], [address] Phone: [phone]   Fax:  [phone]  Name: Gwendolyn Nguyen MRN: [id number] Date of Birth: 31-Jan-1986

## 2019-12-08 ENCOUNTER — Encounter: Payer: BC Managed Care – PPO | Admitting: Physical Therapy

## 2019-12-13 ENCOUNTER — Encounter: Payer: BC Managed Care – PPO | Admitting: Physical Therapy

## 2019-12-13 ENCOUNTER — Other Ambulatory Visit: Payer: Self-pay

## 2019-12-13 ENCOUNTER — Ambulatory Visit: Payer: BC Managed Care – PPO | Admitting: Physical Therapy

## 2019-12-13 ENCOUNTER — Encounter: Payer: Self-pay | Admitting: Physical Therapy

## 2019-12-13 DIAGNOSIS — R262 Difficulty in walking, not elsewhere classified: Secondary | ICD-10-CM

## 2019-12-13 DIAGNOSIS — M6281 Muscle weakness (generalized): Secondary | ICD-10-CM

## 2019-12-13 NOTE — Therapy (Signed)
[facility] [address] [place], [place], [address] Phone: [phone]   Fax:  [phone]  Physical Therapy Treatment  Patient Details  Name: Gwendolyn Nguyen MRN: [id number] Date of Birth: 09-29-1986 Referring Provider (PT): Forrest Moron MD   Encounter Date: 12/13/2019  PT End of Session - 12/13/19 1620    Visit Number  29    Number of Visits  34    Date for PT Re-Evaluation  01/17/20    Authorization Type  BCBS    PT Start Time  4462    PT Stop Time  1707    PT Time Calculation (min)  50 min    Activity Tolerance  Patient tolerated treatment well    Behavior During Therapy  [facility] for tasks assessed/performed       Past Medical History:  Diagnosis Date  . Anemia   . Back pain   . Constipation   . Endometriosis   . IBS (irritable bowel syndrome)   . Low hemoglobin   . Multiple food allergies   . PID (acute pelvic inflammatory disease) 86/3817   complicated by tuboovarian abscess    Past Surgical History:  Procedure Laterality Date  . IR RADIOLOGIST EVAL & MGMT  05/19/2019  . IR RADIOLOGIST EVAL & MGMT  05/24/2019  . LAPAROSCOPIC LYSIS OF ADHESIONS N/A 07/24/2019   Procedure: Laparoscopic Lysis Of Adhesions, Drainage of Pelvic Abscess;  Surgeon: Servando Salina, MD;  Location: [place];  Service: Gynecology;  Laterality: N/A;  . LAPAROSCOPIC LYSIS OF ADHESIONS N/A 07/24/2019   Procedure: Laparoscopic Lysis Of Adhesions and Assist With Drainage of Pelvic Abscess;  Surgeon: Georganna Skeans, MD;  Location: [place];  Service: General;  Laterality: N/A;  . LAPAROSCOPY N/A 07/24/2019   Procedure: LAPAROSCOPY DIAGNOSTIC;  Surgeon: Servando Salina, MD;  Location: [place];  Service: Gynecology;  Laterality: N/A;    There were no vitals filed for this visit.  Subjective Assessment - 12/13/19 1712    Subjective  Pt presents to skilled PT with cane and limping due to fatigue from working two days straight and  walking a lot at work.    How long can you walk comfortably?  always difficulty, uses SPC    Currently in Pain?  Yes    Pain Score  5     Pain Location  Abdomen    Pain Orientation  Left;Lower    Pain Descriptors / Indicators  Sore;Cramping    Pain Type  Chronic pain    Pain Onset  More than a month ago    Multiple Pain Sites  No                       [facility] Adult PT Treatment/Exercise - 12/13/19 0001      Self-Care   Self-Care  Other Self-Care Comments    Other Self-Care Comments   abdominal massage; kegel in supine reviewed and educated on doing when having pain to see if it helps reduce and manage pain      Manual Therapy   Manual therapy comments  supine on wedge pillow    Myofascial Release  fascial release along ascending and descending colon - midline of abdomen release to vaginal calal and rectum with hand placement finger tips on abdomen pressing down into abdominal fascia             PT Education - 12/13/19 1703    Education Details  educated on abdominal massage techniques  Person(s) Educated  Patient    Methods  Explanation;Demonstration    Comprehension  Verbalized understanding;Returned demonstration       PT Short Term Goals - 11/01/19 1624      PT SHORT TERM GOAL #1   Title  Pt will demo at least 1200 ft for 6MWT    Baseline  1064 ft today, improved from 615 ft at eval    Status  Not Met      PT SHORT TERM GOAL #2   Title  able to demo SLS at least 5s each side    Baseline  20 sec bilat.    Status  Achieved      PT SHORT TERM GOAL #3   Title  Pt will demo good form with sit<>stand    Status  Achieved        PT Long Term Goals - 11/23/19 1426      PT LONG TERM GOAL #1   Title  pt will be able to navigate her stairs step over step wihtout difficulty    Baseline  unable to perform with reciprocal gait    Time  8    Period  Weeks    Status  On-going    Target Date  01/17/20      PT LONG TERM GOAL #2   Title  6MWT at  least 1300 ft    Baseline  760 feet    Time  8    Period  Weeks    Status  On-going    Target Date  01/17/20      PT LONG TERM GOAL #3   Title  5TSTS <10s    Baseline  23 seconds    Time  8    Period  Weeks    Status  On-going      PT LONG TERM GOAL #4   Title  Pt will return to challenging HEP that will be carried on to long term exercise program    Baseline  unable    Time  8    Period  Weeks    Status  On-going    Target Date  01/17/20      PT LONG TERM GOAL #5   Title  pt will be able to complete all household chores independently without limitaiton by pain    Baseline  pain limits function about 2x/week    Time  8    Period  Weeks    Status  Revised    Target Date  01/17/20            Plan - 12/13/19 1714    Clinical Impression Statement  Pt had some relief from [facility] after previous visit and is still having difficulty with BM.  Today's session focused more on STM and MFR to abdomen with instruction on how to perfrom this at home.  She was also educated and reviewed how to perform pelvic floor exercises.  She reports she feels more weakness with increased abdominal pain.  Pt educated on doing the exercises for increased muscle activity and blood flow to abdomen.  She is able to engage core and pelvic floor at the same time with cues. Continue to porgress according to POC for strength and pain management.    Comorbidities  multiple internal processes affected by infection, IC; endometriosis    PT Treatment/Interventions  ADLs/Self Care Home Management;Cryotherapy;Electrical Stimulation;Ultrasound;Moist Heat;Iontophoresis 20m/ml Dexamethasone;Traction;Gait training;Stair training;Functional mobility training;Therapeutic activities;Balance training;Therapeutic exercise;Patient/family education;Neuromuscular re-education;Manual techniques;Passive range of motion;Dry needling;Spinal Manipulations;Taping;Aquatic  Therapy;Biofeedback    PT Next Visit Plan  f/u on MFR to abdomen  and doing self massage and kegel from previous session; biofeedback core and pelvic floor    PT Home Exercise Plan  Access Code: [email or web address]    Consulted and Agree with Plan of Care  Patient       Patient will benefit from skilled therapeutic intervention in order to improve the following deficits and impairments:  Difficulty walking, Decreased endurance, Decreased activity tolerance, Pain, Improper body mechanics, Decreased balance, Decreased strength, Postural dysfunction  Visit Diagnosis: Difficulty in walking, not elsewhere classified  Muscle weakness (generalized)     Problem List Patient Active Problem List   Diagnosis Date Noted  . TOA (tubo-ovarian abscess) 07/24/2019  . Tubo-ovarian abscess 07/20/2019  . Abdominal pain 07/20/2019  . Physical deconditioning 05/14/2019  . PID (pelvic inflammatory disease) 05/14/2019  . Small bowel obstruction ([place])   . Pelvic mass 05/02/2019  . Elevated glucose 11/29/2018  . Class 1 obesity with serious comorbidity and body mass index (BMI) of 30.0 to 30.9 in adult 11/29/2018    Camillo Flaming , PT 12/13/2019, 5:20 PM  [facility] [address], [place], [place], [address] Phone: [phone]   Fax:  [phone]  Name: Svea Pusch MRN: [id number] Date of Birth: 1985/12/23

## 2019-12-14 ENCOUNTER — Ambulatory Visit: Payer: BC Managed Care – PPO | Admitting: Registered Nurse

## 2019-12-14 ENCOUNTER — Other Ambulatory Visit: Payer: Self-pay

## 2019-12-14 ENCOUNTER — Encounter: Payer: Self-pay | Admitting: Registered Nurse

## 2019-12-14 VITALS — BP 147/103 | HR 98 | Temp 98.3°F | Ht 64.0 in | Wt 167.4 lb

## 2019-12-14 DIAGNOSIS — R1084 Generalized abdominal pain: Secondary | ICD-10-CM | POA: Diagnosis not present

## 2019-12-14 DIAGNOSIS — K921 Melena: Secondary | ICD-10-CM | POA: Diagnosis not present

## 2019-12-14 NOTE — Progress Notes (Signed)
Established Patient Office Visit  Subjective:  Patient ID: Gwendolyn Nguyen, female    DOB: 1986/07/25  Age: 34 y.o. MRN: [id number]  CC:  Chief Complaint  Patient presents with  . Abdominal Pain    patient states she is still having abdominal pain since june and has noticed some blood in her stools for the past two days. PHQ9=17    HPI Gwendolyn Nguyen presents for worsening abdominal pain and dark red blood in stool  Onset last week. She has been treated by her PCP and GI for abdominal pain with unclear etiology. Had previously had some blood when wiping, but now dark red / maroon blood in stool and when wiping. Denies brbpr and melena. No bleeding at other times besides passing stool. Abdominal pain has worsened, still generalized with some localization to Left side of abdomen and L flank. Denies lightheadedness, dizziness, shob, headache, chest pain.   Past Medical History:  Diagnosis Date  . Anemia   . Back pain   . Constipation   . Endometriosis   . IBS (irritable bowel syndrome)   . Low hemoglobin   . Multiple food allergies   . PID (acute pelvic inflammatory disease) 99991111   complicated by tuboovarian abscess    Past Surgical History:  Procedure Laterality Date  . IR RADIOLOGIST EVAL & MGMT  05/19/2019  . IR RADIOLOGIST EVAL & MGMT  05/24/2019  . LAPAROSCOPIC LYSIS OF ADHESIONS N/A 07/24/2019   Procedure: Laparoscopic Lysis Of Adhesions, Drainage of Pelvic Abscess;  Surgeon: Servando Salina, MD;  Location: [place];  Service: Gynecology;  Laterality: N/A;  . LAPAROSCOPIC LYSIS OF ADHESIONS N/A 07/24/2019   Procedure: Laparoscopic Lysis Of Adhesions and Assist With Drainage of Pelvic Abscess;  Surgeon: Georganna Skeans, MD;  Location: [place];  Service: General;  Laterality: N/A;  . LAPAROSCOPY N/A 07/24/2019   Procedure: LAPAROSCOPY DIAGNOSTIC;  Surgeon: Servando Salina, MD;  Location: [place];  Service: Gynecology;  Laterality: N/A;    Family History  Problem Relation  Age of Onset  . Hyperlipidemia Mother   . Hypertension Mother   . Obesity Mother   . Hyperlipidemia Father   . Hypertension Father   . Obesity Father   . Diabetes Maternal Grandmother     Social History   Socioeconomic History  . Marital status: Single    Spouse name: Not on file  . Number of children: Not on file  . Years of education: Not on file  . Highest education level: Not on file  Occupational History  . Occupation: Social worker  Tobacco Use  . Smoking status: Never Smoker  . Smokeless tobacco: Never Used  Substance and Sexual Activity  . Alcohol use: Yes    Comment: occassional  . Drug use: Never  . Sexual activity: Not on file  Other Topics Concern  . Not on file  Social History Narrative  . Not on file   Social Determinants of Health   Financial Resource Strain:   . Difficulty of Paying Living Expenses: Not on file  Food Insecurity:   . Worried About Charity fundraiser in the Last Year: Not on file  . Ran Out of Food in the Last Year: Not on file  Transportation Needs:   . Lack of Transportation (Medical): Not on file  . Lack of Transportation (Non-Medical): Not on file  Physical Activity:   . Days of Exercise per Week: Not on file  . Minutes of Exercise per Session: Not on file  Stress:   .  Feeling of Stress : Not on file  Social Connections:   . Frequency of Communication with Friends and Family: Not on file  . Frequency of Social Gatherings with Friends and Family: Not on file  . Attends Religious Services: Not on file  . Active Member of Clubs or Organizations: Not on file  . Attends Archivist Meetings: Not on file  . Marital Status: Not on file  Intimate Partner Violence:   . Fear of Current or Ex-Partner: Not on file  . Emotionally Abused: Not on file  . Physically Abused: Not on file  . Sexually Abused: Not on file    Outpatient Medications Prior to Visit  Medication Sig Dispense Refill  . clonazePAM (KLONOPIN) 0.5 MG tablet  Take 1 tablet (0.5 mg total) by mouth at bedtime. For GI symptoms. 30 tablet 0  . cyclobenzaprine (FLEXERIL) 5 MG tablet Take 5 mg by mouth 3 (three) times daily as needed for muscle spasms.    . Iron-FA-B Cmp-C-Biot-Probiotic (FUSION PLUS) CAPS Take 1 capsule by mouth daily.    . norethindrone (AYGESTIN) 5 MG tablet Take 5 mg by mouth daily.     No facility-administered medications prior to visit.    Allergies  Allergen Reactions  . Gadolinium Derivatives Hives, Itching and Nausea And Vomiting    Pt vomited immediately during injection.  15 minutes later, pt developed hives and itching.   . Shellfish Allergy Hives  . Sulfa Antibiotics Other (See Comments)    Patient was told to NOT TAKE THIS  . Iohexol Hives and Rash    08-2018, rash and hives, needs pre meds S/W PATIENT STATED REACTION FROM CT CONTRAST 11/19    ROS Review of Systems Per hpi     Objective:    Physical Exam  Constitutional: She appears well-developed and well-nourished. No distress.  Cardiovascular: Normal rate and regular rhythm.  Pulmonary/Chest: Effort normal. No respiratory distress.  Abdominal: Soft. Bowel sounds are normal. She exhibits no distension. There is abdominal tenderness.  Neurological: She is alert.  Skin: Skin is warm and dry. No rash noted. She is not diaphoretic. No erythema. No pallor.  Psychiatric: She has a normal mood and affect. Her behavior is normal. Judgment and thought content normal.  Nursing note and vitals reviewed.   BP (!) 147/103   Pulse 98   Temp 98.3 F (36.8 C) (Temporal)   Ht 5\' 4"  (1.626 m)   Wt 167 lb 6.4 oz (75.9 kg)   SpO2 99%   BMI 28.73 kg/m  Wt Readings from Last 3 Encounters:  12/14/19 167 lb 6.4 oz (75.9 kg)  11/29/19 170 lb 9.6 oz (77.4 kg)  10/26/19 170 lb 6.4 oz (77.3 kg)     There are no preventive care reminders to display for this patient.  There are no preventive care reminders to display for this patient.  Lab Results  Component Value  Date   TSH 1.160 11/25/2018   Lab Results  Component Value Date   WBC 7.6 09/23/2019   HGB 11.3 09/23/2019   HCT 34.5 09/23/2019   MCV 84.4 09/23/2019   PLT 391 08/01/2019   Lab Results  Component Value Date   NA 128 (L) 08/03/2019   K 3.8 08/03/2019   CO2 21 (L) 08/03/2019   GLUCOSE 453 (H) 08/03/2019   BUN 10 08/03/2019   CREATININE 1.07 (H) 08/03/2019   BILITOT 0.4 07/20/2019   ALKPHOS 50 07/20/2019   AST 32 07/20/2019   ALT 40 07/20/2019  PROT 7.4 07/20/2019   ALBUMIN 3.8 07/20/2019   CALCIUM 8.1 (L) 08/03/2019   ANIONGAP 13 08/03/2019   GFR 78.11 06/09/2019   Lab Results  Component Value Date   CHOL 150 11/25/2018   Lab Results  Component Value Date   HDL 56 11/25/2018   Lab Results  Component Value Date   LDLCALC 86 11/25/2018   Lab Results  Component Value Date   TRIG 41 11/25/2018   No results found for: CHOLHDL No results found for: HGBA1C    Assessment & Plan:   Problem List Items Addressed This Visit    None    Visit Diagnoses    Blood in stool    -  Primary   Relevant Orders   Ambulatory referral to Gastroenterology   Abdominal pain, generalized       Relevant Orders   Ambulatory referral to Gastroenterology   CBC   Amylase   Lipase   Comprehensive metabolic panel      No orders of the defined types were placed in this encounter.   Follow-up: No follow-ups on file.   PLAN  Labs drawn, will follow up as warranted  Requesting new GI referral to a different provider - will send referral  Otherwise, still unclear etiology, bleeding is concerning but seems to be infrequent enough to not warrant emergency intervention at this time  Red flags reviewed, pt demonstrates understanding.  Patient encouraged to call clinic with any questions, comments, or concerns.  Maximiano Coss, NP

## 2019-12-14 NOTE — Patient Instructions (Signed)
° ° ° °  If you have lab work done today you will be contacted with your lab results within the next 2 weeks.  If you have not heard from us then please contact us. The fastest way to get your results is to register for My Chart. ° ° °IF you received an x-ray today, you will receive an invoice from [facility]. Please contact [facility] at [phone] with questions or concerns regarding your invoice.  ° °IF you received labwork today, you will receive an invoice from [facility]. Please contact [facility] at [phone] with questions or concerns regarding your invoice.  ° °Our billing staff will not be able to assist you with questions regarding bills from these companies. ° °You will be contacted with the lab results as soon as they are available. The fastest way to get your results is to activate your My Chart account. Instructions are located on the last page of this paperwork. If you have not heard from us regarding the results in 2 weeks, please contact this office. °  ° ° ° °

## 2019-12-15 ENCOUNTER — Encounter: Payer: Self-pay | Admitting: Physical Therapy

## 2019-12-15 ENCOUNTER — Ambulatory Visit: Payer: BC Managed Care – PPO | Admitting: Physical Therapy

## 2019-12-15 ENCOUNTER — Encounter: Payer: BC Managed Care – PPO | Admitting: Physical Therapy

## 2019-12-15 DIAGNOSIS — M6281 Muscle weakness (generalized): Secondary | ICD-10-CM

## 2019-12-15 DIAGNOSIS — R262 Difficulty in walking, not elsewhere classified: Secondary | ICD-10-CM | POA: Diagnosis not present

## 2019-12-15 LAB — COMPREHENSIVE METABOLIC PANEL
ALT: 218 IU/L — ABNORMAL HIGH (ref 0–32)
AST: 73 IU/L — ABNORMAL HIGH (ref 0–40)
Albumin/Globulin Ratio: 1.1 — ABNORMAL LOW (ref 1.2–2.2)
Albumin: 4.3 g/dL (ref 3.8–4.8)
Alkaline Phosphatase: 77 IU/L (ref 39–117)
BUN/Creatinine Ratio: 9 (ref 9–23)
BUN: 9 mg/dL (ref 6–20)
Bilirubin Total: 0.4 mg/dL (ref 0.0–1.2)
CO2: 20 mmol/L (ref 20–29)
Calcium: 10 mg/dL (ref 8.7–10.2)
Chloride: 102 mmol/L (ref 96–106)
Creatinine, Ser: 1.02 mg/dL — ABNORMAL HIGH (ref 0.57–1.00)
GFR calc Af Amer: 85 mL/min/{1.73_m2} (ref >59)
GFR calc non Af Amer: 73 mL/min/{1.73_m2} (ref >59)
Globulin, Total: 3.9 g/dL (ref 1.5–4.5)
Glucose: 75 mg/dL (ref 65–99)
Potassium: 4.6 mmol/L (ref 3.5–5.2)
Sodium: 138 mmol/L (ref 134–144)
Total Protein: 8.2 g/dL (ref 6.0–8.5)

## 2019-12-15 LAB — CBC
Hematocrit: 42.7 % (ref 34.0–46.6)
Hemoglobin: 13.5 g/dL (ref 11.1–15.9)
MCH: 27.1 pg (ref 26.6–33.0)
MCHC: 31.6 g/dL (ref 31.5–35.7)
MCV: 86 fL (ref 79–97)
Platelets: 386 10*3/uL (ref 150–450)
RBC: 4.99 x10E6/uL (ref 3.77–5.28)
RDW: 13.1 % (ref 11.7–15.4)
WBC: 10.3 10*3/uL (ref 3.4–10.8)

## 2019-12-15 LAB — LIPASE: Lipase: 18 U/L (ref 14–72)

## 2019-12-15 LAB — AMYLASE: Amylase: 66 U/L (ref 31–110)

## 2019-12-15 NOTE — Therapy (Signed)
[facility] [address] [place], [place], [address] Phone: [phone]   Fax:  [phone]  Physical Therapy Treatment  Patient Details  Name: Gwendolyn Nguyen MRN: [id number] Date of Birth: 05-17-1986 Referring Provider (PT): Forrest Moron MD   Encounter Date: 12/15/2019  PT End of Session - 12/15/19 1711    Visit Number  30    Number of Visits  34    Date for PT Re-Evaluation  01/17/20    Authorization Type  BCBS    PT Start Time  1612    PT Stop Time  1702    PT Time Calculation (min)  50 min    Activity Tolerance  Patient tolerated treatment well    Behavior During Therapy  [facility] for tasks assessed/performed       Past Medical History:  Diagnosis Date  . Anemia   . Back pain   . Constipation   . Endometriosis   . IBS (irritable bowel syndrome)   . Low hemoglobin   . Multiple food allergies   . PID (acute pelvic inflammatory disease) 45/8099   complicated by tuboovarian abscess    Past Surgical History:  Procedure Laterality Date  . IR RADIOLOGIST EVAL & MGMT  05/19/2019  . IR RADIOLOGIST EVAL & MGMT  05/24/2019  . LAPAROSCOPIC LYSIS OF ADHESIONS N/A 07/24/2019   Procedure: Laparoscopic Lysis Of Adhesions, Drainage of Pelvic Abscess;  Surgeon: Servando Salina, MD;  Location: [place];  Service: Gynecology;  Laterality: N/A;  . LAPAROSCOPIC LYSIS OF ADHESIONS N/A 07/24/2019   Procedure: Laparoscopic Lysis Of Adhesions and Assist With Drainage of Pelvic Abscess;  Surgeon: Georganna Skeans, MD;  Location: [place];  Service: General;  Laterality: N/A;  . LAPAROSCOPY N/A 07/24/2019   Procedure: LAPAROSCOPY DIAGNOSTIC;  Surgeon: Servando Salina, MD;  Location: [place];  Service: Gynecology;  Laterality: N/A;    There were no vitals filed for this visit.  Subjective Assessment - 12/15/19 1616    Subjective  Pt was not feeling well yesterday.  Pt states she had blood in her stool.  She states the massage helped  her have a BM but continues to feel hard and tender area around descending colon    How long can you walk comfortably?  always difficulty, uses SPC    Currently in Pain?  Yes    Pain Score  6    when not touching it   Pain Location  Abdomen    Pain Orientation  Left;Lower    Pain Onset  More than a month ago                       [facility] Adult PT Treatment/Exercise - 12/15/19 0001      Manual Therapy   Manual therapy comments  supine on wedge pillow    Myofascial Release  fascial release along ascending and descending colon - midline of abdomen release to vaginal calal and rectum with hand placement finger tips on abdomen pressing down into abdominal fascia               PT Short Term Goals - 11/01/19 1624      PT SHORT TERM GOAL #1   Title  Pt will demo at least 1200 ft for 6MWT    Baseline  1064 ft today, improved from 615 ft at eval    Status  Not Met      PT SHORT TERM GOAL #2   Title  able to demo SLS at least 5s each side    Baseline  20 sec bilat.    Status  Achieved      PT SHORT TERM GOAL #3   Title  Pt will demo good form with sit<>stand    Status  Achieved        PT Long Term Goals - 11/23/19 1426      PT LONG TERM GOAL #1   Title  pt will be able to navigate her stairs step over step wihtout difficulty    Baseline  unable to perform with reciprocal gait    Time  8    Period  Weeks    Status  On-going    Target Date  01/17/20      PT LONG TERM GOAL #2   Title  6MWT at least 1300 ft    Baseline  760 feet    Time  8    Period  Weeks    Status  On-going    Target Date  01/17/20      PT LONG TERM GOAL #3   Title  5TSTS <10s    Baseline  23 seconds    Time  8    Period  Weeks    Status  On-going      PT LONG TERM GOAL #4   Title  Pt will return to challenging HEP that will be carried on to long term exercise program    Baseline  unable    Time  8    Period  Weeks    Status  On-going    Target Date  01/17/20      PT LONG  TERM GOAL #5   Title  pt will be able to complete all household chores independently without limitaiton by pain    Baseline  pain limits function about 2x/week    Time  8    Period  Weeks    Status  Revised    Target Date  01/17/20            Plan - 12/15/19 1711    Clinical Impression Statement  Pt continues to have tension and restriction in ascending and descending colon.  She responded well from more MFR techniques iwth increased bowel sounds that were very noticeable after today's treatment.  Pt is making some progress and will continue to benefit from skilled PT to work on ability to contract and relax pelvic and core muscles correctly.    PT Treatment/Interventions  ADLs/Self Care Home Management;Cryotherapy;Electrical Stimulation;Ultrasound;Moist Heat;Iontophoresis 22m/ml Dexamethasone;Traction;Gait training;Stair training;Functional mobility training;Therapeutic activities;Balance training;Therapeutic exercise;Patient/family education;Neuromuscular re-education;Manual techniques;Passive range of motion;Dry needling;Spinal Manipulations;Taping;Aquatic Therapy;Biofeedback    PT Next Visit Plan  internal STM if tolerated, biofeedback core and pelvic floor; 5x sit to stand, lymph massage    PT Home Exercise Plan  Access Code: [email or web address]   Consulted and Agree with Plan of Care  Patient       Patient will benefit from skilled therapeutic intervention in order to improve the following deficits and impairments:  Difficulty walking, Decreased endurance, Decreased activity tolerance, Pain, Improper body mechanics, Decreased balance, Decreased strength, Postural dysfunction  Visit Diagnosis: Difficulty in walking, not elsewhere classified  Muscle weakness (generalized)     Problem List Patient Active Problem List   Diagnosis Date Noted  . TOA (tubo-ovarian abscess) 07/24/2019  . Tubo-ovarian abscess 07/20/2019  . Abdominal pain 07/20/2019  . Physical deconditioning 05/14/2019  .  PID (pelvic inflammatory disease) 05/14/2019  . Small  bowel obstruction ([place])   . Pelvic mass 05/02/2019  . Elevated glucose 11/29/2018  . Class 1 obesity with serious comorbidity and body mass index (BMI) of 30.0 to 30.9 in adult 11/29/2018    Camillo Flaming Florette Thai, PT 12/15/2019, 5:15 PM  [facility] [address], [place], [place], [address] Phone: [phone]   Fax:  [phone]  Name: Gwendolyn Nguyen MRN: [id number] Date of Birth: 05/24/1986

## 2019-12-16 ENCOUNTER — Encounter: Payer: Self-pay | Admitting: Registered Nurse

## 2019-12-20 ENCOUNTER — Other Ambulatory Visit: Payer: Self-pay

## 2019-12-20 ENCOUNTER — Encounter: Payer: Self-pay | Admitting: Physical Therapy

## 2019-12-20 ENCOUNTER — Ambulatory Visit: Payer: BC Managed Care – PPO | Attending: Family Medicine | Admitting: Physical Therapy

## 2019-12-20 DIAGNOSIS — M6281 Muscle weakness (generalized): Secondary | ICD-10-CM | POA: Insufficient documentation

## 2019-12-20 DIAGNOSIS — R262 Difficulty in walking, not elsewhere classified: Secondary | ICD-10-CM | POA: Insufficient documentation

## 2019-12-20 NOTE — Therapy (Signed)
[facility] [address], [place], [place], [address] Phone: [phone]   Fax:  [phone]  Physical Therapy Treatment  Patient Details  Name: Gwendolyn Nguyen MRN: [id number] Date of Birth: 1986/05/18 Referring Provider (PT): Forrest Moron MD   Encounter Date: 12/20/2019  PT End of Session - 12/20/19 1618    Visit Number  31    Number of Visits  34    Date for PT Re-Evaluation  01/17/20    Authorization Type  BCBS    PT Start Time  1619    PT Stop Time  1659    PT Time Calculation (min)  40 min    Activity Tolerance  Patient tolerated treatment well    Behavior During Therapy  [facility] for tasks assessed/performed       Past Medical History:  Diagnosis Date  . Anemia   . Back pain   . Constipation   . Endometriosis   . IBS (irritable bowel syndrome)   . Low hemoglobin   . Multiple food allergies   . PID (acute pelvic inflammatory disease) 34/2876   complicated by tuboovarian abscess    Past Surgical History:  Procedure Laterality Date  . IR RADIOLOGIST EVAL & MGMT  05/19/2019  . IR RADIOLOGIST EVAL & MGMT  05/24/2019  . LAPAROSCOPIC LYSIS OF ADHESIONS N/A 07/24/2019   Procedure: Laparoscopic Lysis Of Adhesions, Drainage of Pelvic Abscess;  Surgeon: Servando Salina, MD;  Location: [place];  Service: Gynecology;  Laterality: N/A;  . LAPAROSCOPIC LYSIS OF ADHESIONS N/A 07/24/2019   Procedure: Laparoscopic Lysis Of Adhesions and Assist With Drainage of Pelvic Abscess;  Surgeon: Georganna Skeans, MD;  Location: [place];  Service: General;  Laterality: N/A;  . LAPAROSCOPY N/A 07/24/2019   Procedure: LAPAROSCOPY DIAGNOSTIC;  Surgeon: Servando Salina, MD;  Location: [place];  Service: Gynecology;  Laterality: N/A;    There were no vitals filed for this visit.  Subjective Assessment - 12/20/19 1619    Subjective  Pt states her side feels a lot better and close to normal.  I increased muscle relaxers and went down on  the cycle suppressant but every couple days I have spotting or cramps.    Patient Stated Goals  pain relief    Pain Score  2     Pain Location  Abdomen    Pain Orientation  Left;Lower    Pain Descriptors / Indicators  Sore;Cramping    Pain Type  Chronic pain    Pain Onset  More than a month ago    Multiple Pain Sites  No                       [facility] Adult PT Treatment/Exercise - 12/20/19 0001      Neuro Re-ed    Neuro Re-ed Details   biofeedback used during entire treatment for cues on correct contract and relax - resting tone set for <25m and contracting around 14-172m     Lumbar Exercises: Stretches   Other Lumbar Stretch Exercise  happy baby with breathing to relax      Lumbar Exercises: Seated   Other Seated Lumbar Exercises  unable to relax - 30 mv in sitting on ball and chair tried using towel roll for soft tissue release; patient unable to tell what her muscles are doing in this position      Lumbar Exercises: Supine   AB Set Limitations  kegel in supine - 3 sec hold  x 20    Bridge  15 reps;3 seconds    Bridge with [employer] Squeeze  15 reps;3 seconds             PT Education - 12/20/19 1707    Education Details  Access Code: [email or web address]    Person(s) Educated  Patient    Methods  Explanation;Demonstration;Handout;Verbal cues;Tactile cues    Comprehension  Verbalized understanding;Returned demonstration       PT Short Term Goals - 11/01/19 1624      PT SHORT TERM GOAL #1   Title  Pt will demo at least 1200 ft for 6MWT    Baseline  1064 ft today, improved from 615 ft at eval    Status  Not Met      PT SHORT TERM GOAL #2   Title  able to demo SLS at least 5s each side    Baseline  20 sec bilat.    Status  Achieved      PT SHORT TERM GOAL #3   Title  Pt will demo good form with sit<>stand    Status  Achieved        PT Long Term Goals - 11/23/19 1426      PT LONG TERM GOAL #1   Title  pt will be able to navigate her stairs step over step  wihtout difficulty    Baseline  unable to perform with reciprocal gait    Time  8    Period  Weeks    Status  On-going    Target Date  01/17/20      PT LONG TERM GOAL #2   Title  6MWT at least 1300 ft    Baseline  760 feet    Time  8    Period  Weeks    Status  On-going    Target Date  01/17/20      PT LONG TERM GOAL #3   Title  5TSTS <10s    Baseline  23 seconds    Time  8    Period  Weeks    Status  On-going      PT LONG TERM GOAL #4   Title  Pt will return to challenging HEP that will be carried on to long term exercise program    Baseline  unable    Time  8    Period  Weeks    Status  On-going    Target Date  01/17/20      PT LONG TERM GOAL #5   Title  pt will be able to complete all household chores independently without limitaiton by pain    Baseline  pain limits function about 2x/week    Time  8    Period  Weeks    Status  Revised    Target Date  01/17/20            Plan - 12/20/19 1708    Clinical Impression Statement  Pt had much less firm and dense tissue around descending colon and her pain was at a "normal" level for her, only 2/10 today.  Pt was able to work 2 full days in a row without significantly increased pain and is not using her cane today.  Pt has difficulty being able to tell when she is activating the pelvic floor muscles.  She did perform kegels correctly with biofeedback and TC performed outside of her clothing. Pt responded well to happy baby stretch and added to HEP.  She will benefit  from skilled PT to work on core and pelvic floor strength and coordination.    Comorbidities  multiple internal processes affected by infection, IC; endometriosis    Examination-Activity Limitations  Bathing;Locomotion Level;Bed Mobility;Bend;Sit;Carry;Sleep;Squat;Dressing;Stairs;Stand;Lift    PT Treatment/Interventions  ADLs/Self Care Home Management;Cryotherapy;Electrical Stimulation;Ultrasound;Moist Heat;Iontophoresis 43m/ml Dexamethasone;Traction;Gait  training;Stair training;Functional mobility training;Therapeutic activities;Balance training;Therapeutic exercise;Patient/family education;Neuromuscular re-education;Manual techniques;Passive range of motion;Dry needling;Spinal Manipulations;Taping;Aquatic Therapy;Biofeedback    PT Next Visit Plan  internal STM if tolerated, biofeedback core and pelvic floor; 5x sit to stand, lymph massage    PT Home Exercise Plan  Access Code: [email or web address]   Consulted and Agree with Plan of Care  Patient       Patient will benefit from skilled therapeutic intervention in order to improve the following deficits and impairments:  Difficulty walking, Decreased endurance, Decreased activity tolerance, Pain, Improper body mechanics, Decreased balance, Decreased strength, Postural dysfunction  Visit Diagnosis: Difficulty in walking, not elsewhere classified  Muscle weakness (generalized)     Problem List Patient Active Problem List   Diagnosis Date Noted  . TOA (tubo-ovarian abscess) 07/24/2019  . Tubo-ovarian abscess 07/20/2019  . Abdominal pain 07/20/2019  . Physical deconditioning 05/14/2019  . PID (pelvic inflammatory disease) 05/14/2019  . [place]   . Pelvic mass 05/02/2019  . Elevated glucose 11/29/2018  . Class 1 obesity with serious comorbidity and body mass index (BMI) of 30.0 to 30.9 in adult 11/29/2018    JCamillo FlamingDesenglau, PT 12/20/2019, 5:17 PM  [facility] [address] [place] [address]Phone: [phone]  Fax:  [phone] Name: Gwendolyn LoseMRN: [id number] of Birth: 507/03/1986

## 2019-12-20 NOTE — Patient Instructions (Signed)
Access Code: [email or web address]  URL: [email or web address]  Date: 12/20/2019  Prepared by: Jari Favre   Exercises Supine Diaphragmatic Breathing with Pelvic Floor Lengthening - 10 reps - 1 sets - 3x daily - 7x weekly Supine Transversus Abdominis Bracing - Hands on Stomach - 10 reps - 3 sets - 5 sec hold - 1x daily - 7x weekly Quadruped Exhale with Pelvic Floor Contraction - 10 reps - 3 sets                            - 1x daily - 7x weekly Quadruped Pelvic Floor Contraction with Weight Shift Forward Backward - 10 reps - 3 sets - 1x daily - 7x weekly Supine Pelvic Floor Stretch - Hands on Knees - 3 reps - 1 sets - 30 sec hold - 1x daily - 7x weekly

## 2019-12-22 ENCOUNTER — Other Ambulatory Visit: Payer: Self-pay

## 2019-12-22 ENCOUNTER — Ambulatory Visit: Payer: BC Managed Care – PPO | Admitting: Physical Therapy

## 2019-12-22 ENCOUNTER — Encounter: Payer: Self-pay | Admitting: Physical Therapy

## 2019-12-22 DIAGNOSIS — R262 Difficulty in walking, not elsewhere classified: Secondary | ICD-10-CM

## 2019-12-22 DIAGNOSIS — M6281 Muscle weakness (generalized): Secondary | ICD-10-CM

## 2019-12-22 NOTE — Therapy (Signed)
[facility] [address] [place], [place], [address] Phone: [phone]   Fax:  [phone]  Physical Therapy Treatment  Patient Details  Name: Gwendolyn Nguyen MRN: [id number] Date of Birth: Dec 16, 1985 Referring Provider (PT): Forrest Moron MD   Encounter Date: 12/22/2019  PT End of Session - 12/22/19 1701    Visit Number  32    Number of Visits  34    Date for PT Re-Evaluation  01/17/20    Authorization Type  BCBS    PT Start Time  1620    PT Stop Time  1705    PT Time Calculation (min)  45 min    Activity Tolerance  Patient tolerated treatment well    Behavior During Therapy  [facility] for tasks assessed/performed       Past Medical History:  Diagnosis Date  . Anemia   . Back pain   . Constipation   . Endometriosis   . IBS (irritable bowel syndrome)   . Low hemoglobin   . Multiple food allergies   . PID (acute pelvic inflammatory disease) 54/9826   complicated by tuboovarian abscess    Past Surgical History:  Procedure Laterality Date  . IR RADIOLOGIST EVAL & MGMT  05/19/2019  . IR RADIOLOGIST EVAL & MGMT  05/24/2019  . LAPAROSCOPIC LYSIS OF ADHESIONS N/A 07/24/2019   Procedure: Laparoscopic Lysis Of Adhesions, Drainage of Pelvic Abscess;  Surgeon: Servando Salina, MD;  Location: [place];  Service: Gynecology;  Laterality: N/A;  . LAPAROSCOPIC LYSIS OF ADHESIONS N/A 07/24/2019   Procedure: Laparoscopic Lysis Of Adhesions and Assist With Drainage of Pelvic Abscess;  Surgeon: Georganna Skeans, MD;  Location: [place];  Service: General;  Laterality: N/A;  . LAPAROSCOPY N/A 07/24/2019   Procedure: LAPAROSCOPY DIAGNOSTIC;  Surgeon: Servando Salina, MD;  Location: [place];  Service: Gynecology;  Laterality: N/A;    There were no vitals filed for this visit.  Subjective Assessment - 12/22/19 1657    Subjective  Pt presents to clinic after working 4 days in a row.  She reports she is feeling a lot better than last  week and she is not using the cane today.  She does feel tired and has been spotting so she is having cramps.    Currently in Pain?  Yes    Pain Score  4     Pain Location  Abdomen    Pain Descriptors / Indicators  Cramping    Aggravating Factors   feels like menstrual cramps    Multiple Pain Sites  No                       [facility] Adult PT Treatment/Exercise - 12/22/19 0001      Lumbar Exercises: Standing   Functional Squats Limitations  mini squat with kegel      Lumbar Exercises: Seated   Long Arc Quad on Chair  Strengthening;Both;5 reps      Lumbar Exercises: Supine   AB Set Limitations  kegel in supine - 3 sec hold x 5      Lumbar Exercises: Quadruped   Single Arm Raise  Right;Left;10 reps    Other Quadruped Lumbar Exercises  rocking back with kegel      Modalities   Modalities  Electrical Stimulation;Moist Heat      Moist Heat Therapy   Number Minutes Moist Heat  15 Minutes    Moist Heat Location  --   abdomen  Personnel officer  [facility]    Electrical Stimulation Parameters  to tolerance    Electrical Stimulation Goals  Pain   reduce cramping              PT Short Term Goals - 11/01/19 1624      PT SHORT TERM GOAL #1   Title  Pt will demo at least 1200 ft for 6MWT    Baseline  1064 ft today, improved from 615 ft at eval    Status  Not Met      PT SHORT TERM GOAL #2   Title  able to demo SLS at least 5s each side    Baseline  20 sec bilat.    Status  Achieved      PT SHORT TERM GOAL #3   Title  Pt will demo good form with sit<>stand    Status  Achieved        PT Long Term Goals - 12/22/19 1625      PT LONG TERM GOAL #1   Title  pt will be able to navigate her stairs step over step wihtout difficulty    Baseline  I walk up and down at home with reciprocal gait this week    Status  Achieved      PT LONG TERM GOAL #3   Title  5TSTS <10s     Baseline  16 sec    Status  Partially Met      PT LONG TERM GOAL #5   Title  pt will be able to complete all household chores independently without limitaiton by pain    Baseline  1-2x on a good week; every day on a bad week    Status  On-going            Plan - 12/22/19 1702    Clinical Impression Statement  Pt demonstrates improved function based on her porgress towards goasl.  She is able to control her pelvic floor with greater effeciency demonstrating contract and hold 4 seconds during exercises with biofeedback for cueing.  Pt returns to normal resting tone.  She was able to do a variety of positions today. Pt had good response from e=stim.  She will continue to beneif trom skilled PT to work on overal strength and muscle coordination for improved function and pain management.    Comorbidities  multiple internal processes affected by infection, IC; endometriosis    PT Treatment/Interventions  ADLs/Self Care Home Management;Cryotherapy;Electrical Stimulation;Ultrasound;Moist Heat;Iontophoresis 72m/ml Dexamethasone;Traction;Gait training;Stair training;Functional mobility training;Therapeutic activities;Balance training;Therapeutic exercise;Patient/family education;Neuromuscular re-education;Manual techniques;Passive range of motion;Dry needling;Spinal Manipulations;Taping;Aquatic Therapy;Biofeedback    PT Next Visit Plan  biofeedback and progress to more standing exercises lunges, PNF    PT Home Exercise Plan  Access Code: [email or web address]   Consulted and Agree with Plan of Care  Patient       Patient will benefit from skilled therapeutic intervention in order to improve the following deficits and impairments:  Difficulty walking, Decreased endurance, Decreased activity tolerance, Pain, Improper body mechanics, Decreased balance, Decreased strength, Postural dysfunction  Visit Diagnosis: Difficulty in walking, not elsewhere classified  Muscle weakness (generalized)     Problem  List Patient Active Problem List   Diagnosis Date Noted  . TOA (tubo-ovarian abscess) 07/24/2019  . Tubo-ovarian abscess 07/20/2019  . Abdominal pain 07/20/2019  . Physical deconditioning 05/14/2019  . PID (pelvic inflammatory disease) 05/14/2019  . Small bowel obstruction ([place]   .  Pelvic mass 05/02/2019  . Elevated glucose 11/29/2018  . Class 1 obesity with serious comorbidity and body mass index (BMI) of 30.0 to 30.9 in adult 11/29/2018    Camillo Flaming Tayvian Holycross, PT 12/22/2019, 5:08 PM  [facility] [address], [place], [place], [address] Phone: [phone]   Fax:  [phone]  Name: Gwendolyn Nguyen MRN: [id number] Date of Birth: [birth year]

## 2019-12-23 ENCOUNTER — Encounter: Payer: Self-pay | Admitting: Registered Nurse

## 2019-12-23 ENCOUNTER — Encounter: Payer: Self-pay | Admitting: Family Medicine

## 2019-12-26 ENCOUNTER — Telehealth: Payer: Self-pay | Admitting: Family Medicine

## 2019-12-26 NOTE — Telephone Encounter (Signed)
Lattie Haw from [facility] [phone] called about pt. They said they have seen here before and they cant do anything there for her. She said she needs to see a teaching. Please advise.

## 2019-12-27 ENCOUNTER — Telehealth: Payer: Self-pay

## 2019-12-27 ENCOUNTER — Other Ambulatory Visit: Payer: Self-pay

## 2019-12-27 ENCOUNTER — Ambulatory Visit: Payer: BC Managed Care – PPO | Admitting: Physical Therapy

## 2019-12-27 DIAGNOSIS — R262 Difficulty in walking, not elsewhere classified: Secondary | ICD-10-CM

## 2019-12-27 DIAGNOSIS — M6281 Muscle weakness (generalized): Secondary | ICD-10-CM

## 2019-12-27 NOTE — Progress Notes (Signed)
Findings: Polypoid nodular mucosa was noted at 20 cm most consistent with granulation tissue was noted at 20 cm; the prep was very poor and therefore no biopsies were done.

## 2019-12-27 NOTE — Patient Instructions (Signed)
Access Code: [email or web address] URL: [email or web address] Date: 12/27/2019 Prepared by: Jari Favre  Exercises Supine Diaphragmatic Breathing with Pelvic Floor Lengthening - 10 reps - 1 sets - 3x daily - 7x weekly Supine Transversus Abdominis Bracing - Hands on Stomach - 10 reps - 3 sets - 5 sec hold - 1x daily - 7x weekly Quadruped Exhale with Pelvic Floor Contraction - 10 reps - 3 sets - 1x daily - 7x weekly Quadruped Pelvic Floor Contraction with Weight Shift Forward Backward - 10 reps - 3 sets - 1x daily - 7x weekly Supine Pelvic Floor Stretch - Hands on Knees - 3 reps - 1 sets - 30 sec hold - 1x daily - 7x weekly Wall Push Up - 10 reps - 1 sets - 1x daily - 7x weekly

## 2019-12-27 NOTE — Therapy (Signed)
[facility] [address] [place], [place], [address] Phone: [phone]   Fax:  [phone]  Physical Therapy Treatment  Patient Details  Name: Gwendolyn Nguyen MRN: [id number] Date of Birth: 21-Nov-1985 Referring Provider (PT): Forrest Moron MD   Encounter Date: 12/27/2019  PT End of Session - 12/27/19 1618    Visit Number  33    Number of Visits  34    Date for PT Re-Evaluation  01/17/20    Authorization Type  BCBS    PT Start Time  1613    PT Stop Time  1653    PT Time Calculation (min)  40 min    Activity Tolerance  Patient tolerated treatment well    Behavior During Therapy  [facility] for tasks assessed/performed       Past Medical History:  Diagnosis Date  . Anemia   . Back pain   . Constipation   . Endometriosis   . IBS (irritable bowel syndrome)   . Low hemoglobin   . Multiple food allergies   . PID (acute pelvic inflammatory disease) 82/4235   complicated by tuboovarian abscess    Past Surgical History:  Procedure Laterality Date  . IR RADIOLOGIST EVAL & MGMT  05/19/2019  . IR RADIOLOGIST EVAL & MGMT  05/24/2019  . LAPAROSCOPIC LYSIS OF ADHESIONS N/A 07/24/2019   Procedure: Laparoscopic Lysis Of Adhesions, Drainage of Pelvic Abscess;  Surgeon: Servando Salina, MD;  Location: [place];  Service: Gynecology;  Laterality: N/A;  . LAPAROSCOPIC LYSIS OF ADHESIONS N/A 07/24/2019   Procedure: Laparoscopic Lysis Of Adhesions and Assist With Drainage of Pelvic Abscess;  Surgeon: Georganna Skeans, MD;  Location: [place];  Service: General;  Laterality: N/A;  . LAPAROSCOPY N/A 07/24/2019   Procedure: LAPAROSCOPY DIAGNOSTIC;  Surgeon: Servando Salina, MD;  Location: [place];  Service: Gynecology;  Laterality: N/A;    There were no vitals filed for this visit.  Subjective Assessment - 12/27/19 1616    Subjective  Pt states she is still doing pretty well but is having spotting.    Patient Stated Goals  pain relief     Currently in Pain?  Yes    Pain Score  2     Pain Location  Abdomen    Pain Orientation  Lower    Pain Descriptors / Indicators  Cramping    Pain Type  Chronic pain    Aggravating Factors   menstrual cramps    Pain Relieving Factors  ice    Multiple Pain Sites  No                       [facility] Adult PT Treatment/Exercise - 12/27/19 0001      Lumbar Exercises: Stretches   Figure 4 Stretch Limitations  ball rollout - 3 x 5 sec      Lumbar Exercises: Standing   Wall Slides  10 reps;2 seconds    Other Standing Lumbar Exercises  pallof press in staggered stance; isometric hip adduction/abduction    Other Standing Lumbar Exercises  standing wall push up - 10      Lumbar Exercises: Supine   Large Ball Abdominal Isometric Limitations  ball roll out bilat - 10x    Other Supine Lumbar Exercises  knee hug with toe tap - 10x each             PT Education - 12/27/19 1656    Education Details  Access Code: [email or web address]  Person(s) Educated  Patient    Methods  Explanation;Demonstration;Handout;Verbal cues;Tactile cues    Comprehension  Verbalized understanding;Returned demonstration       PT Short Term Goals - 11/01/19 1624      PT SHORT TERM GOAL #1   Title  Pt will demo at least 1200 ft for 6MWT    Baseline  1064 ft today, improved from 615 ft at eval    Status  Not Met      PT SHORT TERM GOAL #2   Title  able to demo SLS at least 5s each side    Baseline  20 sec bilat.    Status  Achieved      PT SHORT TERM GOAL #3   Title  Pt will demo good form with sit<>stand    Status  Achieved        PT Long Term Goals - 12/22/19 1625      PT LONG TERM GOAL #1   Title  pt will be able to navigate her stairs step over step wihtout difficulty    Baseline  I walk up and down at home with reciprocal gait this week    Status  Achieved      PT LONG TERM GOAL #3   Title  5TSTS <10s    Baseline  16 sec    Status  Partially Met      PT LONG TERM GOAL #5   Title   pt will be able to complete all household chores independently without limitaiton by pain    Baseline  1-2x on a good week; every day on a bad week    Status  On-going            Plan - 12/27/19 1656    Clinical Impression Statement  Pt is doing much better overall.  She continues to fatigue quickly and has pain which seems to be from menstrual cycle not in control at this time.  Pt was able to do more standing exercises today.    Comorbidities  multiple internal processes affected by infection, IC; endometriosis    Examination-Activity Limitations  Bathing;Locomotion Level;Bed Mobility;Bend;Sit;Carry;Sleep;Squat;Dressing;Stairs;Stand;Lift    PT Treatment/Interventions  ADLs/Self Care Home Management;Cryotherapy;Electrical Stimulation;Ultrasound;Moist Heat;Iontophoresis 9m/ml Dexamethasone;Traction;Gait training;Stair training;Functional mobility training;Therapeutic activities;Balance training;Therapeutic exercise;Patient/family education;Neuromuscular re-education;Manual techniques;Passive range of motion;Dry needling;Spinal Manipulations;Taping;Aquatic Therapy;Biofeedback    PT Next Visit Plan  biofeedback and progress to more standing exercises lunges    PT Home Exercise Plan  Access Code: [email or web address]   Consulted and Agree with Plan of Care  Patient       Patient will benefit from skilled therapeutic intervention in order to improve the following deficits and impairments:  Difficulty walking, Decreased endurance, Decreased activity tolerance, Pain, Improper body mechanics, Decreased balance, Decreased strength, Postural dysfunction  Visit Diagnosis: Difficulty in walking, not elsewhere classified  Muscle weakness (generalized)     Problem List Patient Active Problem List   Diagnosis Date Noted  . TOA (tubo-ovarian abscess) 07/24/2019  . Tubo-ovarian abscess 07/20/2019  . Abdominal pain 07/20/2019  . Physical deconditioning 05/14/2019  . PID (pelvic inflammatory disease)  05/14/2019  . Small bowel obstruction ([place]   . Pelvic mass 05/02/2019  . Elevated glucose 11/29/2018  . Class 1 obesity with serious comorbidity and body mass index (BMI) of 30.0 to 30.9 in adult 11/29/2018    JCamillo FlamingDesenglau, PT 12/27/2019, 5:03 PM  [facility] [address] [place] [address]: [phone]  Fax:  [phone]  Name: Gwendolyn Nguyen MRN: [id number] Date of Birth: [birth year]

## 2019-12-27 NOTE — Telephone Encounter (Signed)
ROI fax to [facility] for patient records.

## 2019-12-28 ENCOUNTER — Telehealth: Payer: Self-pay

## 2019-12-28 NOTE — Telephone Encounter (Signed)
[place] HIM Dept faxed request for medical records to [facility] 12/28/19  KLM

## 2019-12-29 ENCOUNTER — Other Ambulatory Visit: Payer: Self-pay

## 2019-12-29 ENCOUNTER — Ambulatory Visit: Payer: BC Managed Care – PPO | Admitting: Physical Therapy

## 2019-12-29 DIAGNOSIS — R262 Difficulty in walking, not elsewhere classified: Secondary | ICD-10-CM

## 2019-12-29 DIAGNOSIS — M6281 Muscle weakness (generalized): Secondary | ICD-10-CM

## 2019-12-29 NOTE — Patient Instructions (Signed)
Access Code: [email or web address] URL: [email or web address] Date: 12/29/2019 Prepared by: Jari Favre  Exercises Supine Diaphragmatic Breathing with Pelvic Floor Lengthening - 10 reps - 1 sets - 3x daily - 7x weekly Supine Transversus Abdominis Bracing - Hands on Stomach - 10 reps - 3 sets - 5 sec hold - 1x daily - 7x weekly Quadruped Exhale with Pelvic Floor Contraction - 10 reps - 3 sets - 1x daily - 7x weekly Quadruped Pelvic Floor Contraction with Weight Shift Forward Backward - 10 reps - 3 sets - 1x daily - 7x weekly Supine Pelvic Floor Stretch - Hands on Knees - 3 reps - 1 sets - 30 sec hold - 1x daily - 7x weekly Wall Push Up - 10 reps - 1 sets - 1x daily - 7x weekly Shoulder extension with resistance - Neutral - 10 reps - 3 sets - 1x daily - 7x weekly Standing Shoulder Row with Anchored Resistance - 10 reps - 3 sets - 1x daily - 7x weekly Standing Hamstring Stretch with Step - 3 reps - 1 sets - 30 sec hold - 1x daily - 7x weekly Wall Slide with Posterior Pelvic Tilt - 10 reps - 3 sets - 1x daily - 7x weekly

## 2019-12-29 NOTE — Therapy (Signed)
[facility] [address], [place], [place], [address] Phone: [phone]   Fax:  [phone]  Physical Therapy Treatment  Patient Details  Name: Gwendolyn Nguyen MRN: [id number] Date of Birth: 1986-03-23 Referring Provider (PT): Forrest Moron MD   Encounter Date: 12/29/2019  PT End of Session - 12/29/19 1619    Visit Number  34    Date for PT Re-Evaluation  01/17/20    Authorization Type  BCBS    PT Start Time  1619    PT Stop Time  1700    PT Time Calculation (min)  41 min    Activity Tolerance  Patient tolerated treatment well    Behavior During Therapy  [facility] for tasks assessed/performed       Past Medical History:  Diagnosis Date  . Anemia   . Back pain   . Constipation   . Endometriosis   . IBS (irritable bowel syndrome)   . Low hemoglobin   . Multiple food allergies   . PID (acute pelvic inflammatory disease) 80/9983   complicated by tuboovarian abscess    Past Surgical History:  Procedure Laterality Date  . IR RADIOLOGIST EVAL & MGMT  05/19/2019  . IR RADIOLOGIST EVAL & MGMT  05/24/2019  . LAPAROSCOPIC LYSIS OF ADHESIONS N/A 07/24/2019   Procedure: Laparoscopic Lysis Of Adhesions, Drainage of Pelvic Abscess;  Surgeon: Servando Salina, MD;  Location: [place];  Service: Gynecology;  Laterality: N/A;  . LAPAROSCOPIC LYSIS OF ADHESIONS N/A 07/24/2019   Procedure: Laparoscopic Lysis Of Adhesions and Assist With Drainage of Pelvic Abscess;  Surgeon: Georganna Skeans, MD;  Location: [place];  Service: General;  Laterality: N/A;  . LAPAROSCOPY N/A 07/24/2019   Procedure: LAPAROSCOPY DIAGNOSTIC;  Surgeon: Servando Salina, MD;  Location: [place];  Service: Gynecology;  Laterality: N/A;    There were no vitals filed for this visit.  Subjective Assessment - 12/29/19 1622    Subjective  I was walking a lot at work today and am tired but I feel like it is getting better and I made it to Thursday!  I feel like I am  having an endless cycle    How long can you walk comfortably?  can walk from classroom to classroom    Patient Stated Goals  pain relief    Currently in Pain?  Yes    Pain Score  3     Pain Location  Abdomen    Pain Orientation  Lower    Pain Descriptors / Indicators  Cramping    Pain Type  Chronic pain    Multiple Pain Sites  No                       [facility] Adult PT Treatment/Exercise - 12/29/19 0001      Lumbar Exercises: Stretches   Active Hamstring Stretch  Right;Left;5 reps;20 seconds    Quad Stretch  Right;Left;5 reps;20 seconds      Lumbar Exercises: Aerobic   UBE (Upper Arm Bike)  L1 3/3 fwd/back      Lumbar Exercises: Standing   Wall Slides  2 seconds;20 reps    Row  Strengthening;Both;20 reps;Theraband    Theraband Level (Row)  Level 3 (Green)    Shoulder Extension  Strengthening;Both;20 reps;Theraband    Theraband Level (Shoulder Extension)  Level 3 (Green)    Other Standing Lumbar Exercises  standing on rocker boar - fwd/back and balance; SLS on blue pad hip abduction  Other Standing Lumbar Exercises  standing wall push up - 20x             PT Education - 12/29/19 1715    Education Details  Access Code: [email or web address]    Person(s) Educated  Patient    Methods  Explanation;Demonstration;Handout;Verbal cues    Comprehension  Verbalized understanding;Returned demonstration       PT Short Term Goals - 11/01/19 1624      PT SHORT TERM GOAL #1   Title  Pt will demo at least 1200 ft for 6MWT    Baseline  1064 ft today, improved from 615 ft at eval    Status  Not Met      PT SHORT TERM GOAL #2   Title  able to demo SLS at least 5s each side    Baseline  20 sec bilat.    Status  Achieved      PT SHORT TERM GOAL #3   Title  Pt will demo good form with sit<>stand    Status  Achieved        PT Long Term Goals - 12/22/19 1625      PT LONG TERM GOAL #1   Title  pt will be able to navigate her stairs step over step wihtout difficulty     Baseline  I walk up and down at home with reciprocal gait this week    Status  Achieved      PT LONG TERM GOAL #3   Title  5TSTS <10s    Baseline  16 sec    Status  Partially Met      PT LONG TERM GOAL #5   Title  pt will be able to complete all household chores independently without limitaiton by pain    Baseline  1-2x on a good week; every day on a bad week    Status  On-going            Plan - 12/29/19 1708    Clinical Impression Statement  Pt did really well today and walking without AD or gait abnormalities after a full day of work.  Pt did all exercises in standing today.  No increased pain.  Will benefit from skilled PT to continue to improve strength.    PT Treatment/Interventions  ADLs/Self Care Home Management;Cryotherapy;Electrical Stimulation;Ultrasound;Moist Heat;Iontophoresis 71m/ml Dexamethasone;Traction;Gait training;Stair training;Functional mobility training;Therapeutic activities;Balance training;Therapeutic exercise;Patient/family education;Neuromuscular re-education;Manual techniques;Passive range of motion;Dry needling;Spinal Manipulations;Taping;Aquatic Therapy;Biofeedback    PT Next Visit Plan  add lunges and more stability exercises; progress core; assess goals next    PT Home Exercise Plan  Access Code: [email or web address]   Consulted and Agree with Plan of Care  Patient       Patient will benefit from skilled therapeutic intervention in order to improve the following deficits and impairments:  Difficulty walking, Decreased endurance, Decreased activity tolerance, Pain, Improper body mechanics, Decreased balance, Decreased strength, Postural dysfunction  Visit Diagnosis: Difficulty in walking, not elsewhere classified  Muscle weakness (generalized)     Problem List Patient Active Problem List   Diagnosis Date Noted  . TOA (tubo-ovarian abscess) 07/24/2019  . Tubo-ovarian abscess 07/20/2019  . Abdominal pain 07/20/2019  . Physical deconditioning 05/14/2019   . PID (pelvic inflammatory disease) 05/14/2019  . Small bowel obstruction ([place]   . Pelvic mass 05/02/2019  . Elevated glucose 11/29/2018  . Class 1 obesity with serious comorbidity and body mass index (BMI) of 30.0 to 30.9 in adult 11/29/2018    [facility] Tavon Corriher,  PT 12/29/2019, 5:15 PM  [facility] [address] [place], [place], [address] Phone: [phone]   Fax:  [phone]  Name: Gwendolyn Nguyen MRN: [id number] Date of Birth: 1986/04/07

## 2019-12-30 NOTE — Telephone Encounter (Signed)
Dr. Rush Landmark, this pt was referred for blood in stool and abdominal pain.  She had her GI records transferred from Dr. Lorie Apley office.  Pt stated that "Dr. Collene Mares cannot figure out what is wrong with me and will not see me anymore."  Records will be sent to you for review.  Will you accept this pt?

## 2019-12-31 NOTE — Telephone Encounter (Signed)
I am away on scheduled leave for next 10 days.  I will not review until I return.  If more urgent evaluation is required, this can go to any of the other MDs for evaluation. GM

## 2020-01-01 NOTE — Progress Notes (Signed)
Cardiology Office Note:   Date:  01/02/2020  NAME:  Gwendolyn Nguyen    MRN: [id number] DOB:  [birth year]   PCP:  Forrest Moron, MD  Cardiologist:  Evalina Field, MD   Referring MD: Forrest Moron, MD   Chief Complaint  Patient presents with  . Follow-up   History of Present Illness:   Gwendolyn Nguyen is a 34 y.o. female with a hx of HTN who presents for follow-up of HTN. Evaluated in December for elevated BP. She opted for lifestyle modification and no treatment. Follow-up today. Echo normal.  Blood pressure today 122/70.  She reports she is in physical therapy after her pelvic floor abscesses.  She reports she is now in pelvic floor rehabilitation.  She states she has no issues such as chest pain, shortness of breath or palpitations.  She has worked on her salt reduction strategies and her blood pressure today is very normal.  I did review her echocardiogram with her which is very normal.  She appears to be doing quite well overall.  Past Medical History: Past Medical History:  Diagnosis Date  . Anemia   . Back pain   . Constipation   . Endometriosis   . IBS (irritable bowel syndrome)   . Low hemoglobin   . Multiple food allergies   . PID (acute pelvic inflammatory disease) 99991111   complicated by tuboovarian abscess    Past Surgical History: Past Surgical History:  Procedure Laterality Date  . IR RADIOLOGIST EVAL & MGMT  05/19/2019  . IR RADIOLOGIST EVAL & MGMT  05/24/2019  . LAPAROSCOPIC LYSIS OF ADHESIONS N/A 07/24/2019   Procedure: Laparoscopic Lysis Of Adhesions, Drainage of Pelvic Abscess;  Surgeon: Servando Salina, MD;  Location: [place];  Service: Gynecology;  Laterality: N/A;  . LAPAROSCOPIC LYSIS OF ADHESIONS N/A 07/24/2019   Procedure: Laparoscopic Lysis Of Adhesions and Assist With Drainage of Pelvic Abscess;  Surgeon: Georganna Skeans, MD;  Location: [place];  Service: General;  Laterality: N/A;  . LAPAROSCOPY N/A 07/24/2019   Procedure: LAPAROSCOPY DIAGNOSTIC;   Surgeon: Servando Salina, MD;  Location: [place];  Service: Gynecology;  Laterality: N/A;    Current Medications: Current Meds  Medication Sig  . clonazePAM (KLONOPIN) 0.5 MG tablet Take 1 tablet (0.5 mg total) by mouth at bedtime. For GI symptoms.  . cyclobenzaprine (FLEXERIL) 5 MG tablet Take 5 mg by mouth 3 (three) times daily as needed for muscle spasms.  . Iron-FA-B Cmp-C-Biot-Probiotic (FUSION PLUS) CAPS Take 1 capsule by mouth daily.  . norethindrone (AYGESTIN) 5 MG tablet Take 5 mg by mouth daily.     Allergies:    Gadolinium derivatives, Shellfish allergy, Sulfa antibiotics, and Iohexol   Social History: Social History   Socioeconomic History  . Marital status: Single    Spouse name: Not on file  . Number of children: Not on file  . Years of education: Not on file  . Highest education level: Not on file  Occupational History  . Occupation: Social worker  Tobacco Use  . Smoking status: Never Smoker  . Smokeless tobacco: Never Used  Substance and Sexual Activity  . Alcohol use: Yes    Comment: occassional  . Drug use: Never  . Sexual activity: Not on file  Other Topics Concern  . Not on file  Social History Narrative  . Not on file   Social Determinants of Health   Financial Resource Strain:   . Difficulty of Paying Living Expenses:   Food Insecurity:   .  Worried About Charity fundraiser in the Last Year:   . Arboriculturist in the Last Year:   Transportation Needs:   . Film/video editor (Medical):   Marland Kitchen Lack of Transportation (Non-Medical):   Physical Activity:   . Days of Exercise per Week:   . Minutes of Exercise per Session:   Stress:   . Feeling of Stress :   Social Connections:   . Frequency of Communication with Friends and Family:   . Frequency of Social Gatherings with Friends and Family:   . Attends Religious Services:   . Active Member of Clubs or Organizations:   . Attends Archivist Meetings:   Marland Kitchen Marital Status:       Family History: The patient's family history includes Diabetes in her maternal grandmother; Hyperlipidemia in her father and mother; Hypertension in her father and mother; Obesity in her father and mother.  ROS:   All other ROS reviewed and negative. Pertinent positives noted in the HPI.     EKGs/Labs/Other Studies Reviewed:   The following studies were personally reviewed by me today:  TTE 10/11/2019 1. Left ventricular ejection fraction, by visual estimation, is 60 to  65%. The left ventricle has normal function. There is no left ventricular  hypertrophy.  2. The left ventricle has no regional wall motion abnormalities.  3. Global right ventricle has normal systolic function.The right  ventricular size is normal. No increase in right ventricular wall  thickness.  4. Left atrial size was normal.  5. Right atrial size was normal.  6. The mitral valve is normal in structure. No evidence of mitral valve  regurgitation. No evidence of mitral stenosis.  7. The tricuspid valve is normal in structure. Tricuspid valve  regurgitation is trivial.  8. The aortic valve is normal in structure. Aortic valve regurgitation is  not visualized. No evidence of aortic valve sclerosis or stenosis.  9. The pulmonic valve was normal in structure. Pulmonic valve  regurgitation is trivial.  10. Normal pulmonary artery systolic pressure.  11. The inferior vena cava is normal in size with greater than 50%  respiratory variability, suggesting right atrial pressure of 3 mmHg.   Recent Labs: 07/23/2019: Magnesium 1.4 12/14/2019: ALT 218; BUN 9; Creatinine, Ser 1.02; Hemoglobin 13.5; Platelets 386; Potassium 4.6; Sodium 138   Recent Lipid Panel    Component Value Date/Time   CHOL 150 11/25/2018 0928   TRIG 41 11/25/2018 0928   HDL 56 11/25/2018 0928   LDLCALC 86 11/25/2018 0928    Physical Exam:   VS:  BP (!) 122/7 (BP Location: Left Arm, Patient Position: Sitting, Cuff Size: Normal)    Pulse 88   Temp (!) 97.3 F (36.3 C)   Ht 5\' 4"  (1.626 m)   Wt 178 lb (80.7 kg)   BMI 30.55 kg/m    Wt Readings from Last 3 Encounters:  01/02/20 178 lb (80.7 kg)  12/14/19 167 lb 6.4 oz (75.9 kg)  11/29/19 170 lb 9.6 oz (77.4 kg)    General: Well nourished, well developed, in no acute distress Heart: Atraumatic, normal size  Eyes: PEERLA, EOMI  Neck: Supple, no JVD Endocrine: No thryomegaly Cardiac: Normal S1, S2; RRR; no murmurs, rubs, or gallops Lungs: Clear to auscultation bilaterally, no wheezing, rhonchi or rales  Abd: Soft, nontender, no hepatomegaly  Ext: No edema, pulses 2+ Musculoskeletal: No deformities, BUE and BLE strength normal and equal Skin: Warm and dry, no rashes   Neuro: Alert and oriented  to person, place, time, and situation, CNII-XII grossly intact, no focal deficits  Psych: Normal mood and affect   ASSESSMENT:   Gwendolyn Nguyen is a 34 y.o. female who presents for the following: 1. Transient elevated blood pressure    PLAN:   1. Transient elevated blood pressure -Had transiently elevated blood pressure after pelvic floor abscesses.  Was evaluated by me after physical therapy requested our evaluation.  She has done well with salt reduction and conservative measures.  Her blood pressure is 122/70.  She does not have high blood pressure.  She will continue with diet and exercise moving forward in life.  She will see [place] as needed.  Disposition: Return if symptoms worsen or fail to improve.  Medication Adjustments/Labs and Tests Ordered: Current medicines are reviewed at length with the patient today.  Concerns regarding medicines are outlined above.  No orders of the defined types were placed in this encounter.  No orders of the defined types were placed in this encounter.   Patient Instructions  Medication Instructions:  The current medical regimen is effective;  continue present plan and medications.  *If you need a refill on your cardiac  medications before your next appointment, please call your pharmacy*   Follow-Up: At [facility], you and your health needs are our priority.  As part of our continuing mission to provide you with exceptional heart care, we have created designated Provider Care Teams.  These Care Teams include your primary Cardiologist (physician) and Advanced Practice Providers (APPs -  Physician Assistants and Nurse Practitioners) who all work together to provide you with the care you need, when you need it.  We recommend signing up for the patient portal called "MyChart".  Sign up information is provided on this After Visit Summary.  MyChart is used to connect with patients for Virtual Visits (Telemedicine).  Patients are able to view lab/test results, encounter notes, upcoming appointments, etc.  Non-urgent messages can be sent to your provider as well.   To learn more about what you can do with MyChart, go to [email or web address].    Your next appointment:   As needed  The format for your next appointment:   In Person  Provider:   Eleonore Chiquito, MD      Time Spent with Patient: I have spent a total of 25 minutes with patient reviewing hospital notes, telemetry, EKGs, labs and examining the patient as well as establishing an assessment and plan that was discussed with the patient.  > 50% of time was spent in direct patient care.  Signed, Addison Naegeli. Audie Box, [place]  [address], [place], [place] [address] [phone]  01/02/2020 4:13 PM

## 2020-01-02 ENCOUNTER — Ambulatory Visit: Payer: BC Managed Care – PPO | Admitting: Cardiovascular Disease

## 2020-01-02 ENCOUNTER — Other Ambulatory Visit: Payer: Self-pay

## 2020-01-02 ENCOUNTER — Encounter: Payer: Self-pay | Admitting: Cardiovascular Disease

## 2020-01-02 VITALS — BP 122/7 | HR 88 | Temp 97.3°F | Ht 64.0 in | Wt 178.0 lb

## 2020-01-02 DIAGNOSIS — R030 Elevated blood-pressure reading, without diagnosis of hypertension: Secondary | ICD-10-CM

## 2020-01-02 NOTE — Patient Instructions (Signed)
Medication Instructions:  The current medical regimen is effective;  continue present plan and medications.  *If you need a refill on your cardiac medications before your next appointment, please call your pharmacy*    Follow-Up: At [facility], you and your health needs are our priority.  As part of our continuing mission to provide you with exceptional heart care, we have created designated Provider Care Teams.  These Care Teams include your primary Cardiologist (physician) and Advanced Practice Providers (APPs -  Physician Assistants and Nurse Practitioners) who all work together to provide you with the care you need, when you need it.  We recommend signing up for the patient portal called "MyChart".  Sign up information is provided on this After Visit Summary.  MyChart is used to connect with patients for Virtual Visits (Telemedicine).  Patients are able to view lab/test results, encounter notes, upcoming appointments, etc.  Non-urgent messages can be sent to your provider as well.   To learn more about what you can do with MyChart, go to [email or web address].    Your next appointment:   As needed  The format for your next appointment:   In Person  Provider:   Ava O'Neal, MD      

## 2020-01-03 ENCOUNTER — Encounter: Payer: Self-pay | Admitting: Physical Therapy

## 2020-01-03 ENCOUNTER — Ambulatory Visit: Payer: BC Managed Care – PPO | Admitting: Physical Therapy

## 2020-01-03 DIAGNOSIS — M6281 Muscle weakness (generalized): Secondary | ICD-10-CM

## 2020-01-03 DIAGNOSIS — R262 Difficulty in walking, not elsewhere classified: Secondary | ICD-10-CM

## 2020-01-03 NOTE — Therapy (Signed)
[facility] [address] [place], [place], [address] Phone: [phone]   Fax:  [phone]  Physical Therapy Treatment  Patient Details  Name: Gwendolyn Nguyen MRN: [id number] Date of Birth: Jul 08, 1986 Referring Provider (PT): Forrest Moron MD   Encounter Date: 01/03/2020  PT End of Session - 01/03/20 1630    Visit Number  35    Date for PT Re-Evaluation  01/17/20    Authorization Type  BCBS    PT Start Time  8527    PT Stop Time  1655    PT Time Calculation (min)  42 min       Past Medical History:  Diagnosis Date  . Anemia   . Back pain   . Constipation   . Endometriosis   . IBS (irritable bowel syndrome)   . Low hemoglobin   . Multiple food allergies   . PID (acute pelvic inflammatory disease) 78/2423   complicated by tuboovarian abscess    Past Surgical History:  Procedure Laterality Date  . IR RADIOLOGIST EVAL & MGMT  05/19/2019  . IR RADIOLOGIST EVAL & MGMT  05/24/2019  . LAPAROSCOPIC LYSIS OF ADHESIONS N/A 07/24/2019   Procedure: Laparoscopic Lysis Of Adhesions, Drainage of Pelvic Abscess;  Surgeon: Servando Salina, MD;  Location: [place];  Service: Gynecology;  Laterality: N/A;  . LAPAROSCOPIC LYSIS OF ADHESIONS N/A 07/24/2019   Procedure: Laparoscopic Lysis Of Adhesions and Assist With Drainage of Pelvic Abscess;  Surgeon: Georganna Skeans, MD;  Location: [place];  Service: General;  Laterality: N/A;  . LAPAROSCOPY N/A 07/24/2019   Procedure: LAPAROSCOPY DIAGNOSTIC;  Surgeon: Servando Salina, MD;  Location: [place];  Service: Gynecology;  Laterality: N/A;    There were no vitals filed for this visit.  Subjective Assessment - 01/03/20 1619    Subjective  Pt states she is feeling okay now but had a small flare up at lunch with pain in abdomen and down the legs.    How long can you walk comfortably?  can walk from classroom to classroom    Patient Stated Goals  pain relief    Currently in Pain?   No/denies         [facility] PT Assessment - 01/03/20 0001      6 minute walk test results    Aerobic Endurance Distance Walked  5361    Endurance additional comments  no rest; no SPC; end of full work day                   [facility] Adult PT Treatment/Exercise - 01/03/20 0001      Lumbar Exercises: Stretches   Active Hamstring Stretch  Right;Left;5 reps;20 seconds      Lumbar Exercises: Aerobic   Nustep  L2 x 5 min PT present for status update      Lumbar Exercises: Standing   Forward Lunge Limitations  lunge on slider - 10x each side    Row  Strengthening;Power tower;Both;20 reps    Row Limitations  20#    Shoulder Extension  Strengthening;Power Tower;Both;20 reps    Shoulder Extension Limitations  15#      Lumbar Exercises: Seated   Long Arc Quad on [place]  Strengthening;Both;10 reps    Hip Flexion on [place]  Strengthening;Both;20 reps    Other Seated Lumbar Exercises  blue weighted ball chops fwd and diagonals sitting on ball               PT Short Term  Goals - 11/01/19 1624      PT SHORT TERM GOAL #1   Title  Pt will demo at least 1200 ft for 6MWT    Baseline  1064 ft today, improved from 615 ft at eval    Status  Not Met      PT SHORT TERM GOAL #2   Title  able to demo SLS at least 5s each side    Baseline  20 sec bilat.    Status  Achieved      PT SHORT TERM GOAL #3   Title  Pt will demo good form with sit<>stand    Status  Achieved        PT Long Term Goals - 01/03/20 1621      PT LONG TERM GOAL #2   Title  6MWT at least 1300 ft    Baseline  1064    Status  On-going      PT LONG TERM GOAL #4   Title  Pt will return to challenging HEP that will be carried on to long term exercise program    Status  On-going      PT LONG TERM GOAL #5   Title  pt will be able to complete all household chores independently without limitaiton by pain    Baseline  2x/ week    Status  On-going            Plan - 01/03/20 1633    Clinical Impression  Statement  Pt made a lot of improvement on her goals and able to walk >1050f.  Pt tolerated exercises well today.  Pt was able to progress core strength and added lunges.  Pt will benefit from skilled PT to continue to address core strength and endurance goals to ensure she is able to perform all home and job related tasks    Comorbidities  multiple internal processes affected by infection, IC; endometriosis    Examination-Activity Limitations  Bathing;Locomotion Level;Bed Mobility;Bend;Sit;Carry;Sleep;Squat;Dressing;Stairs;Stand;Lift    PT Treatment/Interventions  ADLs/Self Care Home Management;Cryotherapy;Electrical Stimulation;Ultrasound;Moist Heat;Iontophoresis 464mml Dexamethasone;Traction;Gait training;Stair training;Functional mobility training;Therapeutic activities;Balance training;Therapeutic exercise;Patient/family education;Neuromuscular re-education;Manual techniques;Passive range of motion;Dry needling;Spinal Manipulations;Taping;Aquatic Therapy;Biofeedback    PT Next Visit Plan  add lunges and progress stability exercises; progress core    PT Home Exercise Plan  Access Code: [email or web address]  Consulted and Agree with Plan of Care  Patient       Patient will benefit from skilled therapeutic intervention in order to improve the following deficits and impairments:  Difficulty walking, Decreased endurance, Decreased activity tolerance, Pain, Improper body mechanics, Decreased balance, Decreased strength, Postural dysfunction  Visit Diagnosis: Difficulty in walking, not elsewhere classified  Muscle weakness (generalized)     Problem List Patient Active Problem List   Diagnosis Date Noted  . TOA (tubo-ovarian abscess) 07/24/2019  . Tubo-ovarian abscess 07/20/2019  . Abdominal pain 07/20/2019  . Physical deconditioning 05/14/2019  . PID (pelvic inflammatory disease) 05/14/2019  . Small bowel obstruction ([facility]  . Pelvic mass 05/02/2019  . Elevated glucose 11/29/2018  . Class 1  obesity with serious comorbidity and body mass index (BMI) of 30.0 to 30.9 in adult 11/29/2018    JaCamillo Flamingesenglau, PT 01/03/2020, 5:05 PM  [facility] [address]: [phone] Fax:  [phone]: LaSavana SpinaRN: [id number] of Birth: 5/November 30, 1985

## 2020-01-10 ENCOUNTER — Encounter: Payer: Self-pay | Admitting: Physical Therapy

## 2020-01-10 ENCOUNTER — Ambulatory Visit: Payer: BC Managed Care – PPO | Admitting: Physical Therapy

## 2020-01-10 ENCOUNTER — Other Ambulatory Visit: Payer: Self-pay

## 2020-01-10 ENCOUNTER — Encounter: Payer: Self-pay | Admitting: Family Medicine

## 2020-01-10 DIAGNOSIS — R262 Difficulty in walking, not elsewhere classified: Secondary | ICD-10-CM

## 2020-01-10 DIAGNOSIS — M6281 Muscle weakness (generalized): Secondary | ICD-10-CM

## 2020-01-10 NOTE — Therapy (Signed)
[facility] [address] [place], [place], [address] Phone: [phone]   Fax:  [phone]  Physical Therapy Treatment  Patient Details  Name: Gwendolyn Nguyen MRN: [id number] Date of Birth: 03-Oct-1986 Referring Provider (PT): Forrest Moron MD   Encounter Date: 01/10/2020  PT End of Session - 01/10/20 1630    Visit Number  36    Date for PT Re-Evaluation  01/17/20    Authorization Type  BCBS    PT Start Time  1600    PT Stop Time  1640    PT Time Calculation (min)  40 min    Activity Tolerance  Patient tolerated treatment well    Behavior During Therapy  [facility] for tasks assessed/performed       Past Medical History:  Diagnosis Date  . Anemia   . Back pain   . Constipation   . Endometriosis   . IBS (irritable bowel syndrome)   . Low hemoglobin   . Multiple food allergies   . PID (acute pelvic inflammatory disease) 49/7026   complicated by tuboovarian abscess    Past Surgical History:  Procedure Laterality Date  . IR RADIOLOGIST EVAL & MGMT  05/19/2019  . IR RADIOLOGIST EVAL & MGMT  05/24/2019  . LAPAROSCOPIC LYSIS OF ADHESIONS N/A 07/24/2019   Procedure: Laparoscopic Lysis Of Adhesions, Drainage of Pelvic Abscess;  Surgeon: Servando Salina, MD;  Location: [place];  Service: Gynecology;  Laterality: N/A;  . LAPAROSCOPIC LYSIS OF ADHESIONS N/A 07/24/2019   Procedure: Laparoscopic Lysis Of Adhesions and Assist With Drainage of Pelvic Abscess;  Surgeon: Georganna Skeans, MD;  Location: [place];  Service: General;  Laterality: N/A;  . LAPAROSCOPY N/A 07/24/2019   Procedure: LAPAROSCOPY DIAGNOSTIC;  Surgeon: Servando Salina, MD;  Location: [place];  Service: Gynecology;  Laterality: N/A;    There were no vitals filed for this visit.  Subjective Assessment - 01/10/20 1603    Subjective  Pt states her energy is feeling good.  States she is having some pain but not bad.    How long can you walk comfortably?  can  walk from classroom to classroom    Patient Stated Goals  pain relief    Currently in Pain?  Yes    Pain Score  3     Pain Location  Abdomen    Pain Orientation  Lower    Pain Descriptors / Indicators  Sore;Aching    Pain Type  Chronic pain    Pain Radiating Towards  feels like ovaries and stomach    Pain Onset  More than a month ago    Pain Frequency  Intermittent    Multiple Pain Sites  No                       [facility] Adult PT Treatment/Exercise - 01/10/20 0001      Neuro Re-ed    Neuro Re-ed Details   circles and pelvic tilts 20 x each way      Lumbar Exercises: Aerobic   Nustep  L4 x 6 min PT present for status update      Lumbar Exercises: Standing   Lifting  From waist;to overhead;20 reps;Weights    Lifting Limitations  yellow weighted ball    Row  Strengthening;Theraband;20 reps;Both    Theraband Level (Row)  Level 4 (Blue)    Row Limitations  seated on red ball    Other Standing Lumbar Exercises  pallof press with  side step - red band - 10x each side      Lumbar Exercises: Seated   Long Arc Quad on [place]  Strengthening;Both;10 reps      supine - ball squeeze - 5 sec holdx 10 Ball press and overhead with TrA activated - 10x Diagonals UE D1/D2 15# standing on foam mat - 20x each         PT Short Term Goals - 11/01/19 1624      PT SHORT TERM GOAL #1   Title  Pt will demo at least 1200 ft for 6MWT    Baseline  1064 ft today, improved from 615 ft at eval    Status  Not Met      PT SHORT TERM GOAL #2   Title  able to demo SLS at least 5s each side    Baseline  20 sec bilat.    Status  Achieved      PT SHORT TERM GOAL #3   Title  Pt will demo good form with sit<>stand    Status  Achieved        PT Long Term Goals - 01/03/20 1621      PT LONG TERM GOAL #2   Title  6MWT at least 1300 ft    Baseline  1064    Status  On-going      PT LONG TERM GOAL #4   Title  Pt will return to challenging HEP that will be carried on to long term  exercise program    Status  On-going      PT LONG TERM GOAL #5   Title  pt will be able to complete all household chores independently without limitaiton by pain    Baseline  2x/ week    Status  On-going            Plan - 01/10/20 1634    Clinical Impression Statement  Pt tolerated exercises well with progression of strengthening.  She performed exercises standing on foam mat for increased challenge to her core.  Pt has been able to manage her pain and had more energy.  Pt will benefit from skilled PT to continue to progress functional goals.    Comorbidities  multiple internal processes affected by infection, IC; endometriosis    Examination-Activity Limitations  Bathing;Locomotion Level;Bed Mobility;Bend;Sit;Carry;Sleep;Squat;Dressing;Stairs;Stand;Lift    PT Treatment/Interventions  ADLs/Self Care Home Management;Cryotherapy;Electrical Stimulation;Ultrasound;Moist Heat;Iontophoresis 59m/ml Dexamethasone;Traction;Gait training;Stair training;Functional mobility training;Therapeutic activities;Balance training;Therapeutic exercise;Patient/family education;Neuromuscular re-education;Manual techniques;Passive range of motion;Dry needling;Spinal Manipulations;Taping;Aquatic Therapy;Biofeedback    PT Next Visit Plan  progress lifting techniques; core on BOSU, quadruped on pball; endurance with eliptical or nustep    PT Home Exercise Plan  Access Code: [email or web address]   Consulted and Agree with Plan of Care  Patient       Patient will benefit from skilled therapeutic intervention in order to improve the following deficits and impairments:  Difficulty walking, Decreased endurance, Decreased activity tolerance, Pain, Improper body mechanics, Decreased balance, Decreased strength, Postural dysfunction  Visit Diagnosis: Difficulty in walking, not elsewhere classified  Muscle weakness (generalized)     Problem List Patient Active Problem List   Diagnosis Date Noted  . TOA (tubo-ovarian  abscess) 07/24/2019  . Tubo-ovarian abscess 07/20/2019  . Abdominal pain 07/20/2019  . Physical deconditioning 05/14/2019  . PID (pelvic inflammatory disease) 05/14/2019  . Small bowel obstruction ([facility] [place]   . Pelvic mass 05/02/2019  . Elevated glucose 11/29/2018  . Class 1 obesity with serious comorbidity and body mass index (  BMI) of 30.0 to 30.9 in adult 11/29/2018    Camillo Flaming Zinnia Tindall, PT 01/10/2020, 4:42 PM  [facility] [address], [place], [place], [address] Phone: [phone]   Fax:  [phone]  Name: Gwendolyn Nguyen MRN: [id number] Date of Birth: [birth year]

## 2020-01-12 ENCOUNTER — Other Ambulatory Visit: Payer: Self-pay

## 2020-01-12 ENCOUNTER — Ambulatory Visit: Payer: BC Managed Care – PPO | Admitting: Physical Therapy

## 2020-01-12 ENCOUNTER — Encounter: Payer: Self-pay | Admitting: Physical Therapy

## 2020-01-12 DIAGNOSIS — R262 Difficulty in walking, not elsewhere classified: Secondary | ICD-10-CM

## 2020-01-12 DIAGNOSIS — M6281 Muscle weakness (generalized): Secondary | ICD-10-CM

## 2020-01-12 NOTE — Therapy (Signed)
[facility] Nguyen [facility] [address] [place], [place], [address] Phone: [phone]   Fax:  [phone]  Physical Therapy Treatment  Patient Details  Name: Gwendolyn Nguyen MRN: [id number] Date of Birth: [birth year] Referring Provider (PT): Gwendolyn Moron MD   Encounter Date: 01/12/2020  PT End of Session - 01/12/20 1631    Visit Number  37    Date for PT Re-Evaluation  01/17/20    Authorization Type  BCBS    PT Start Time  1601    PT Stop Time  1649    PT Time Calculation (min)  40 min    Activity Tolerance  Patient tolerated treatment well    Behavior During Therapy  [facility] Nguyen for tasks assessed/performed       Past Medical History:  Diagnosis Date  . Anemia   . Back pain   . Constipation   . Endometriosis   . IBS (irritable bowel syndrome)   . Low hemoglobin   . Multiple food allergies   . PID (acute pelvic inflammatory disease) 06/3234   complicated by tuboovarian abscess    Past Surgical History:  Procedure Laterality Date  . IR RADIOLOGIST EVAL & MGMT  05/19/2019  . IR RADIOLOGIST EVAL & MGMT  05/24/2019  . LAPAROSCOPIC LYSIS OF ADHESIONS N/A 07/24/2019   Procedure: Laparoscopic Lysis Of Adhesions, Drainage of Pelvic Abscess;  Surgeon: Gwendolyn Salina, MD;  Location: [place];  Service: Gynecology;  Laterality: N/A;  . LAPAROSCOPIC LYSIS OF ADHESIONS N/A 07/24/2019   Procedure: Laparoscopic Lysis Of Adhesions and Assist With Drainage of Pelvic Abscess;  Surgeon: Gwendolyn Skeans, MD;  Location: [place];  Service: General;  Laterality: N/A;  . LAPAROSCOPY N/A 07/24/2019   Procedure: LAPAROSCOPY DIAGNOSTIC;  Surgeon: Gwendolyn Salina, MD;  Location: [place];  Service: Gynecology;  Laterality: N/A;    There were no vitals filed for this visit.  Subjective Assessment - 01/12/20 1615    Subjective  I am feeling like the pain is manageable.  I have pain at school that limits me at school every day on a bad day. Denies pain  currently.    Patient Stated Goals  pain relief    Currently in Pain?  No/denies                       [facility] Adult PT Treatment/Exercise - 01/13/20 0001      Lumbar Exercises: Stretches   Other Lumbar Stretch Exercise  adductor butterfly in supine and sitting with ball rolling; modified down dog on mat table - educated and 1 min each with breathing      Lumbar Exercises: Standing   Other Standing Lumbar Exercises  D2 15# - 20x       Lumbar Exercises: Seated   Long Arc Quad on [place]  Strengthening;Both;10 reps      Lumbar Exercises: Quadruped   Other Quadruped Lumbar Exercises  core braced and LE raises on ball - 10x each               PT Short Term Goals - 11/01/19 1624      PT SHORT TERM GOAL #1   Title  Pt will demo at least 1200 ft for 6MWT    Baseline  1064 ft today, improved from 615 ft at eval    Status  Not Met      PT SHORT TERM GOAL #2   Title  able to demo SLS at least 5s each side  Baseline  20 sec bilat.    Status  Achieved      PT SHORT TERM GOAL #3   Title  Pt will demo good form with sit<>stand    Status  Achieved        PT Long Term Goals - 01/03/20 1621      PT LONG TERM GOAL #2   Title  6MWT at least 1300 ft    Baseline  1064    Status  On-going      PT LONG TERM GOAL #4   Title  Pt will return to challenging HEP that will be carried on to long term exercise program    Status  On-going      PT LONG TERM GOAL #5   Title  pt will be able to complete all household chores independently without limitaiton by pain    Baseline  2x/ week    Status  On-going            Plan - 01/13/20 0754    Clinical Impression Statement  Today's session focused on progression of core strength as well as trouble shooting pain management at work. Pt manages pain well with butterfly stretch in supine but cannot do this at work.  Demo of other similar stretches performed.    PT Treatment/Interventions  ADLs/Self Care Home  Management;Cryotherapy;Electrical Stimulation;Ultrasound;Moist Heat;Iontophoresis 77m/ml Dexamethasone;Traction;Gait training;Stair training;Functional mobility training;Therapeutic activities;Balance training;Therapeutic exercise;Patient/family education;Neuromuscular re-education;Manual techniques;Passive range of motion;Dry needling;Spinal Manipulations;Taping;Aquatic Therapy;Biofeedback    PT Next Visit Plan  progress lifting techniques; core on BOSU, quadruped on pball; endurance with eliptical or nustep    PT Home Exercise Plan  Access Code: [email or web address]   Consulted and Agree with Plan of Care  Patient       Patient will benefit from skilled therapeutic intervention in order to improve the following deficits and impairments:  Difficulty walking, Decreased endurance, Decreased activity tolerance, Pain, Improper body mechanics, Decreased balance, Decreased strength, Postural dysfunction  Visit Diagnosis: Difficulty in walking, not elsewhere classified  Muscle weakness (generalized)     Problem List Patient Active Problem List   Diagnosis Date Noted  . TOA (tubo-ovarian abscess) 07/24/2019  . Tubo-ovarian abscess 07/20/2019  . Abdominal pain 07/20/2019  . Physical deconditioning 05/14/2019  . PID (pelvic inflammatory disease) 05/14/2019  . Small bowel obstruction ([place]   . Pelvic mass 05/02/2019  . Elevated glucose 11/29/2018  . Class 1 obesity with serious comorbidity and body mass index (BMI) of 30.0 to 30.9 in adult 11/29/2018    JCamillo FlamingDesenglau, PT 01/13/2020, 7:55 AM  [facility] [address] [place] [address]Phone: [phone]  Fax:  [phone] Name: LDareth AndrewMRN: [id number] of Birth: 507-Aug-1987

## 2020-01-17 ENCOUNTER — Ambulatory Visit: Payer: BC Managed Care – PPO | Admitting: Physical Therapy

## 2020-01-17 ENCOUNTER — Other Ambulatory Visit: Payer: Self-pay

## 2020-01-17 DIAGNOSIS — R262 Difficulty in walking, not elsewhere classified: Secondary | ICD-10-CM

## 2020-01-17 DIAGNOSIS — M6281 Muscle weakness (generalized): Secondary | ICD-10-CM

## 2020-01-18 ENCOUNTER — Encounter: Payer: Self-pay | Admitting: Physical Therapy

## 2020-01-18 NOTE — Therapy (Signed)
[facility] [address] [place], [place], [address] Phone: [phone]   Fax:  [phone]  Physical Therapy Treatment  Patient Details  Name: Gwendolyn Nguyen MRN: [id number] Date of Birth: Aug 05, 1986 Referring Provider (PT): Forrest Moron MD   Encounter Date: 01/17/2020  PT End of Session - 01/18/20 0958    Visit Number  38    Date for PT Re-Evaluation  03/13/20    Authorization Type  BCBS    PT Start Time  1616    PT Stop Time  1700    PT Time Calculation (min)  44 min    Activity Tolerance  Patient tolerated treatment well    Behavior During Therapy  [facility] for tasks assessed/performed       Past Medical History:  Diagnosis Date  . Anemia   . Back pain   . Constipation   . Endometriosis   . IBS (irritable bowel syndrome)   . Low hemoglobin   . Multiple food allergies   . PID (acute pelvic inflammatory disease) 53/6644   complicated by tuboovarian abscess    Past Surgical History:  Procedure Laterality Date  . IR RADIOLOGIST EVAL & MGMT  05/19/2019  . IR RADIOLOGIST EVAL & MGMT  05/24/2019  . LAPAROSCOPIC LYSIS OF ADHESIONS N/A 07/24/2019   Procedure: Laparoscopic Lysis Of Adhesions, Drainage of Pelvic Abscess;  Surgeon: Servando Salina, MD;  Location: [place];  Service: Gynecology;  Laterality: N/A;  . LAPAROSCOPIC LYSIS OF ADHESIONS N/A 07/24/2019   Procedure: Laparoscopic Lysis Of Adhesions and Assist With Drainage of Pelvic Abscess;  Surgeon: Georganna Skeans, MD;  Location: [place];  Service: General;  Laterality: N/A;  . LAPAROSCOPY N/A 07/24/2019   Procedure: LAPAROSCOPY DIAGNOSTIC;  Surgeon: Servando Salina, MD;  Location: [place];  Service: Gynecology;  Laterality: N/A;    There were no vitals filed for this visit.  Subjective Assessment - 01/17/20 1621    Subjective  Pt states there is no pain.  Pt is not sure she can continue PT due to her schedule. She feels like her pain is undercontrol so  she can go to therapy at any location for core strength and endurance    How long can you walk comfortably?  can walk about    Patient Stated Goals  be able to lift again    Currently in Pain?  No/denies         [facility] PT Assessment - 01/18/20 0001      Assessment   Medical Diagnosis  physical deconditioning    Referring Provider (PT)  Delia Chimes A MD    Onset Date/Surgical Date  05/02/19                   [facility] Adult PT Treatment/Exercise - 01/18/20 0001      Lumbar Exercises: Aerobic   Nustep  L4 x 6 min PT present for status update      Lumbar Exercises: Standing   Functional Squats  20 reps    Functional Squats Limitations  holing 8lb    Row  Strengthening;20 reps;Both;Power tower   sitting on ball; 25# overhead   Shoulder Extension  Strengthening;Power Tower;Both;20 reps    Shoulder Extension Limitations  20#    Other Standing Lumbar Exercises  sitting on ball 3lb flexion and scaption with core braced - 10x each    Other Standing Lumbar Exercises  standing mini squat with low row at power tower- 20lb  PT Short Term Goals - 11/01/19 1624      PT SHORT TERM GOAL #1   Title  Pt will demo at least 1200 ft for 6MWT    Baseline  1064 ft today, improved from 615 ft at eval    Status  Not Met      PT SHORT TERM GOAL #2   Title  able to demo SLS at least 5s each side    Baseline  20 sec bilat.    Status  Achieved      PT SHORT TERM GOAL #3   Title  Pt will demo good form with sit<>stand    Status  Achieved        PT Long Term Goals - 01/17/20 1629      PT LONG TERM GOAL #1   Title  pt will be able to navigate her stairs step over step wihtout difficulty    Status  Achieved      PT LONG TERM GOAL #2   Title  6MWT at least 1300 ft    Baseline  1064    Time  8    Period  Weeks    Status  On-going    Target Date  03/13/20      PT LONG TERM GOAL #3   Title  5TSTS <10s    Baseline  13 sec    Time  8    Period  Weeks     Status  Partially Met    Target Date  03/13/20      PT LONG TERM GOAL #4   Title  Pt will return to challenging HEP that will be carried on to long term exercise program    Time  8    Period  Weeks    Status  On-going    Target Date  03/13/20      PT LONG TERM GOAL #5   Title  pt will be able to complete all household chores independently without limitaiton by pain    Baseline  I can work around the pain so things take longer to get done but I can complete it maybe in several days    Time  8    Period  Weeks    Status  Partially Met    Target Date  03/13/20      Additional Long Term Goals   Additional Long Term Goals  Yes      PT LONG TERM GOAL #6   Title  Pt will be able to lift at least 20lb from the floor to lift her cases of water    Baseline  lifts 8lb from waist height    Time  8    Period  Weeks    Status  New    Target Date  03/13/20            Plan - 01/18/20 0959    Clinical Impression Statement  Re-assessed goals.  Pt is still progressing.  She complains mainly of lack of energy and does lack trunk stability with lifting.  Pt able to demonstrate good technique with cues and lifting an 8lb weight from about 12 inches from the floor.  Pt would benefit from skilled PT to progress to reach maximum function and be able to perform her goals as stated.    Comorbidities  multiple internal processes affected by infection, IC; endometriosis    Examination-Activity Limitations  Bathing;Locomotion Level;Bed Mobility;Bend;Sit;Carry;Sleep;Squat;Dressing;Stairs;Stand;Lift    Examination-Participation Restrictions  Meal Prep;Cleaning;Community Activity;Laundry;Shop  Stability/Clinical Decision Making  Stable/Uncomplicated    Clinical Decision Making  Low    Rehab Potential  Excellent    PT Frequency  2x / week    PT Duration  8 weeks    PT Treatment/Interventions  ADLs/Self Care Home Management;Cryotherapy;Electrical Stimulation;Ultrasound;Moist Heat;Iontophoresis 39m/ml  Dexamethasone;Traction;Gait training;Stair training;Functional mobility training;Therapeutic activities;Balance training;Therapeutic exercise;Patient/family education;Neuromuscular re-education;Manual techniques;Passive range of motion;Dry needling;Spinal Manipulations;Taping;Aquatic Therapy;Biofeedback    PT Next Visit Plan  progress lifting techniques; squat/SLS on BOSU, quadruped on pball; plank progression; endurance with eliptical or nustep    PT Home Exercise Plan  Access Code: [email or web address]   Consulted and Agree with Plan of Care  Patient       Patient will benefit from skilled therapeutic intervention in order to improve the following deficits and impairments:  Difficulty walking, Decreased endurance, Decreased activity tolerance, Pain, Improper body mechanics, Decreased balance, Decreased strength, Postural dysfunction  Visit Diagnosis: Difficulty in walking, not elsewhere classified  Muscle weakness (generalized)     Problem List Patient Active Problem List   Diagnosis Date Noted  . TOA (tubo-ovarian abscess) 07/24/2019  . Tubo-ovarian abscess 07/20/2019  . Abdominal pain 07/20/2019  . Physical deconditioning 05/14/2019  . PID (pelvic inflammatory disease) 05/14/2019  . Small bowel obstruction ([place]   . Pelvic mass 05/02/2019  . Elevated glucose 11/29/2018  . Class 1 obesity with serious comorbidity and body mass index (BMI) of 30.0 to 30.9 in adult 11/29/2018    JCamillo FlamingDesenglau, PT 01/18/2020, 11:11 AM  [place] [address] [place] [address]Phone: [phone]  Fax:  [phone] Name: LLatiana TomeiMRN: [id number] of Birth: 505-01-1986

## 2020-01-18 NOTE — Addendum Note (Signed)
Addended by: Su Hoff on: 01/18/2020 11:14 AM   Modules accepted: Orders

## 2020-01-19 ENCOUNTER — Ambulatory Visit: Payer: BC Managed Care – PPO | Attending: Family Medicine | Admitting: Physical Therapy

## 2020-01-19 ENCOUNTER — Other Ambulatory Visit: Payer: Self-pay

## 2020-01-19 DIAGNOSIS — R262 Difficulty in walking, not elsewhere classified: Secondary | ICD-10-CM | POA: Diagnosis not present

## 2020-01-19 DIAGNOSIS — M6281 Muscle weakness (generalized): Secondary | ICD-10-CM | POA: Diagnosis present

## 2020-01-19 NOTE — Therapy (Addendum)
[facility] [address] [place], [place], [address] Phone: [phone]   Fax:  [phone]  Physical Therapy Treatment  Patient Details  Name: Gwendolyn Nguyen MRN: [id number] Date of Birth: 1986-02-17 Referring Provider (PT): Forrest Moron MD   Encounter Date: 01/19/2020  PT End of Session - 01/19/20 1655    Visit Number  39    Date for PT Re-Evaluation  03/13/20    Authorization Type  BCBS    PT Start Time  1610    PT Stop Time  1645    PT Time Calculation (min)  35 min    Activity Tolerance  Patient tolerated treatment well;Patient limited by pain    Behavior During Therapy  [facility] for tasks assessed/performed       Past Medical History:  Diagnosis Date  . Anemia   . Back pain   . Constipation   . Endometriosis   . IBS (irritable bowel syndrome)   . Low hemoglobin   . Multiple food allergies   . PID (acute pelvic inflammatory disease) 53/6644   complicated by tuboovarian abscess    Past Surgical History:  Procedure Laterality Date  . IR RADIOLOGIST EVAL & MGMT  05/19/2019  . IR RADIOLOGIST EVAL & MGMT  05/24/2019  . LAPAROSCOPIC LYSIS OF ADHESIONS N/A 07/24/2019   Procedure: Laparoscopic Lysis Of Adhesions, Drainage of Pelvic Abscess;  Surgeon: Servando Salina, MD;  Location: [place];  Service: Gynecology;  Laterality: N/A;  . LAPAROSCOPIC LYSIS OF ADHESIONS N/A 07/24/2019   Procedure: Laparoscopic Lysis Of Adhesions and Assist With Drainage of Pelvic Abscess;  Surgeon: Georganna Skeans, MD;  Location: [place];  Service: General;  Laterality: N/A;  . LAPAROSCOPY N/A 07/24/2019   Procedure: LAPAROSCOPY DIAGNOSTIC;  Surgeon: Servando Salina, MD;  Location: [place];  Service: Gynecology;  Laterality: N/A;    There were no vitals filed for this visit.  Subjective Assessment - 01/19/20 1703    Subjective  Pt reports abdominal pain due to spotting.  She has tried reducing her birth control meds due to breaking  out in a rash.    Currently in Pain?  Yes    Pain Score  5     Pain Location  Abdomen    Pain Orientation  Lower    Pain Descriptors / Indicators  Aching    Pain Type  Chronic pain    Pain Onset  More than a month ago    Pain Frequency  Intermittent    Multiple Pain Sites  No                       [facility] Adult PT Treatment/Exercise - 01/19/20 0001      Self-Care   Other Self-Care Comments   educated on home TENS      Modalities   Modalities  Cryotherapy      Moist Heat Therapy   Number Minutes Moist Heat  20 Minutes    Moist Heat Location  Lumbar Spine      Cryotherapy   Number Minutes Cryotherapy  20 Minutes    Cryotherapy Location  Other (comment)   abdomen   Type of Cryotherapy  Ice pack      Electrical Stimulation   Electrical Stimulation Location  abdomen    Electrical Stimulation Action  IFC    Electrical Stimulation Parameters  to tolerance    Electrical Stimulation Goals  Pain  PT Short Term Goals - 11/01/19 1624      PT SHORT TERM GOAL #1   Title  Pt will demo at least 1200 ft for 6MWT    Baseline  1064 ft today, improved from 615 ft at eval    Status  Not Met      PT SHORT TERM GOAL #2   Title  able to demo SLS at least 5s each side    Baseline  20 sec bilat.    Status  Achieved      PT SHORT TERM GOAL #3   Title  Pt will demo good form with sit<>stand    Status  Achieved        PT Long Term Goals - 01/17/20 1629      PT LONG TERM GOAL #1   Title  pt will be able to navigate her stairs step over step wihtout difficulty    Status  Achieved      PT LONG TERM GOAL #2   Title  6MWT at least 1300 ft    Baseline  1064    Time  8    Period  Weeks    Status  On-going    Target Date  03/13/20      PT LONG TERM GOAL #3   Title  5TSTS <10s    Baseline  13 sec    Time  8    Period  Weeks    Status  Partially Met    Target Date  03/13/20      PT LONG TERM GOAL #4   Title  Pt will return to challenging HEP  that will be carried on to long term exercise program    Time  8    Period  Weeks    Status  On-going    Target Date  03/13/20      PT LONG TERM GOAL #5   Title  pt will be able to complete all household chores independently without limitaiton by pain    Baseline  I can work around the pain so things take longer to get done but I can complete it maybe in several days    Time  8    Period  Weeks    Status  Partially Met    Target Date  03/13/20      Additional Long Term Goals   Additional Long Term Goals  Yes      PT LONG TERM GOAL #6   Title  Pt will be able to lift at least 20lb from the floor to lift her cases of water    Baseline  lifts 8lb from waist height    Time  8    Period  Weeks    Status  New    Target Date  03/13/20            Plan - 01/19/20 1657    Clinical Impression Statement  Pt had increased pain today so today's session focused on pain management as well as how to manage pain at home. Pt educated on home tens unit.  Pt is not able to get into clinic for another month due to her work schedule and specific slots needed.  Pt will benefit from skilled PT to continue to address deficits of strength and endurance and lifting techniques for return to max function.    Examination-Activity Limitations  Bathing;Locomotion Level;Bed Mobility;Bend;Sit;Carry;Sleep;Squat;Dressing;Stairs;Stand;Lift    PT Treatment/Interventions  ADLs/Self Care Home Management;Cryotherapy;Electrical Stimulation;Ultrasound;Moist Heat;Iontophoresis 68m/ml Dexamethasone;Traction;Gait training;Stair  training;Functional mobility training;Therapeutic activities;Balance training;Therapeutic exercise;Patient/family education;Neuromuscular re-education;Manual techniques;Passive range of motion;Dry needling;Spinal Manipulations;Taping;Aquatic Therapy;Biofeedback    PT Next Visit Plan  progress lifting techniques; squat/SLS on BOSU, quadruped on pball; plank progression; endurance with eliptical or nustep     PT Home Exercise Plan  Access Code: [email or web address]    Consulted and Agree with Plan of Care  Patient       Patient will benefit from skilled therapeutic intervention in order to improve the following deficits and impairments:  Difficulty walking, Decreased endurance, Decreased activity tolerance, Pain, Improper body mechanics, Decreased balance, Decreased strength, Postural dysfunction  Visit Diagnosis: Difficulty in walking, not elsewhere classified  Muscle weakness (generalized)     Problem List Patient Active Problem List   Diagnosis Date Noted  . TOA (tubo-ovarian abscess) 07/24/2019  . Tubo-ovarian abscess 07/20/2019  . Abdominal pain 07/20/2019  . Physical deconditioning 05/14/2019  . PID (pelvic inflammatory disease) 05/14/2019  . Small bowel obstruction ([place])   . Pelvic mass 05/02/2019  . Elevated glucose 11/29/2018  . Class 1 obesity with serious comorbidity and body mass index (BMI) of 30.0 to 30.9 in adult 11/29/2018    Camillo Flaming Dalbert Stillings, PT 01/19/2020, 5:05 PM  [facility] [address] [place], [place], [address] Phone: [phone]   Fax:  [phone]  Name: Gwendolyn Nguyen MRN: [id number] Date of Birth: 1986/01/23  PHYSICAL THERAPY DISCHARGE SUMMARY  Visits from Start of Care:39 Current functional level related to goals / functional outcomes: See above details   Remaining deficits: See above details   Education / Equipment: HEP Plan: Patient agrees to discharge.  Patient goals were not met. Patient is being discharged due to not returning since the last visit.  ?????    [employer], PT 04/26/20 10:54 AM

## 2020-01-25 NOTE — Telephone Encounter (Addendum)
This was reviewed on 01/11/2020 and sent back downstairs. I found records in file downstairs. Ok to schedule for new pt appointment- next available per Dr.Mansouraty.

## 2020-01-25 NOTE — Telephone Encounter (Signed)
I do not have any records available for me to review at this time Can we ensure that I got the records and reviewed them because if I did not then that would be reason why did not finish this request? Otherwise I am okay with potentially considering a transfer of care but I need to have an opportunity to review her work-up by Dr. Collene Mares.  Justice Britain, MD [facility] # [phone]

## 2020-01-25 NOTE — Telephone Encounter (Signed)
Please update status of records.  Pt requested Dr. Rush Landmark.

## 2020-01-25 NOTE — Telephone Encounter (Addendum)
Dr.Mansouraty please see Hoy Register previous note?

## 2020-01-26 ENCOUNTER — Encounter: Payer: Self-pay | Admitting: Gastroenterology

## 2020-01-31 ENCOUNTER — Encounter: Payer: Self-pay | Admitting: Family Medicine

## 2020-02-01 NOTE — Telephone Encounter (Signed)
Pt wants suggestions to help her get by until her GI appt in may. She is still having the LT sided abdominal pain

## 2020-02-02 ENCOUNTER — Ambulatory Visit: Payer: BC Managed Care – PPO | Admitting: Physical Therapy

## 2020-02-09 ENCOUNTER — Encounter: Payer: BC Managed Care – PPO | Admitting: Physical Therapy

## 2020-02-16 ENCOUNTER — Encounter: Payer: BC Managed Care – PPO | Admitting: Physical Therapy

## 2020-02-21 ENCOUNTER — Ambulatory Visit: Payer: BC Managed Care – PPO | Admitting: Gastroenterology

## 2020-02-21 ENCOUNTER — Encounter: Payer: Self-pay | Admitting: Gastroenterology

## 2020-02-21 ENCOUNTER — Other Ambulatory Visit (INDEPENDENT_AMBULATORY_CARE_PROVIDER_SITE_OTHER): Payer: BC Managed Care – PPO

## 2020-02-21 VITALS — BP 128/88 | HR 117 | Temp 98.5°F | Ht 65.0 in | Wt 182.0 lb

## 2020-02-21 DIAGNOSIS — R1084 Generalized abdominal pain: Secondary | ICD-10-CM | POA: Diagnosis not present

## 2020-02-21 DIAGNOSIS — R102 Pelvic and perineal pain: Secondary | ICD-10-CM

## 2020-02-21 DIAGNOSIS — K625 Hemorrhage of anus and rectum: Secondary | ICD-10-CM

## 2020-02-21 DIAGNOSIS — R933 Abnormal findings on diagnostic imaging of other parts of digestive tract: Secondary | ICD-10-CM

## 2020-02-21 DIAGNOSIS — N809 Endometriosis, unspecified: Secondary | ICD-10-CM

## 2020-02-21 DIAGNOSIS — K2970 Gastritis, unspecified, without bleeding: Secondary | ICD-10-CM | POA: Diagnosis not present

## 2020-02-21 DIAGNOSIS — Z8719 Personal history of other diseases of the digestive system: Secondary | ICD-10-CM

## 2020-02-21 DIAGNOSIS — G8929 Other chronic pain: Secondary | ICD-10-CM

## 2020-02-21 DIAGNOSIS — K2960 Other gastritis without bleeding: Secondary | ICD-10-CM

## 2020-02-21 LAB — COMPREHENSIVE METABOLIC PANEL
ALT: 326 U/L — ABNORMAL HIGH (ref 0–35)
AST: 116 U/L — ABNORMAL HIGH (ref 0–37)
Albumin: 3.9 g/dL (ref 3.5–5.2)
Alkaline Phosphatase: 78 U/L (ref 39–117)
BUN: 10 mg/dL (ref 6–23)
CO2: 29 mEq/L (ref 19–32)
Calcium: 9.2 mg/dL (ref 8.4–10.5)
Chloride: 100 mEq/L (ref 96–112)
Creatinine, Ser: 1.03 mg/dL (ref 0.40–1.20)
GFR: 75.15 mL/min (ref >60.00)
Glucose, Bld: 85 mg/dL (ref 70–99)
Potassium: 4.1 mEq/L (ref 3.5–5.1)
Sodium: 133 mEq/L — ABNORMAL LOW (ref 135–145)
Total Bilirubin: 0.4 mg/dL (ref 0.2–1.2)
Total Protein: 8.1 g/dL (ref 6.0–8.3)

## 2020-02-21 LAB — CORTISOL: Cortisol, Plasma: 7.1 ug/dL

## 2020-02-21 LAB — CBC
HCT: 40.5 % (ref 36.0–46.0)
Hemoglobin: 13.2 g/dL (ref 12.0–15.0)
MCHC: 32.5 g/dL (ref 30.0–36.0)
MCV: 83.1 fl (ref 78.0–100.0)
Platelets: 404 10*3/uL — ABNORMAL HIGH (ref 150.0–400.0)
RBC: 4.88 Mil/uL (ref 3.87–5.11)
RDW: 15.2 % (ref 11.5–15.5)
WBC: 11.2 10*3/uL — ABNORMAL HIGH (ref 4.0–10.5)

## 2020-02-21 LAB — HIGH SENSITIVITY CRP: CRP, High Sensitivity: 85.81 mg/L — ABNORMAL HIGH (ref 0.000–5.000)

## 2020-02-21 LAB — SEDIMENTATION RATE: Sed Rate: 130 mm/hr — ABNORMAL HIGH (ref 0–20)

## 2020-02-21 LAB — TSH: TSH: 2.13 u[IU]/mL (ref 0.35–4.50)

## 2020-02-21 LAB — IGA: IgA: 36 mg/dL — ABNORMAL LOW (ref 68–378)

## 2020-02-21 MED ORDER — PREDNISONE 50 MG PO TABS
ORAL_TABLET | ORAL | 0 refills | Status: DC
Start: 2020-02-21 — End: 2022-03-03

## 2020-02-21 MED ORDER — ESOMEPRAZOLE MAGNESIUM 40 MG PO CPDR
40.0000 mg | DELAYED_RELEASE_CAPSULE | Freq: Every day | ORAL | 3 refills | Status: DC
Start: 2020-02-21 — End: 2020-05-16

## 2020-02-21 NOTE — Patient Instructions (Addendum)
1.You have been scheduled for a CT scan of the abdomen and pelvis at [facility] ([address]---this is in the same building as [employer]).   You are scheduled on 02/28/20 at 2:00pm. You should arrive 15 minutes prior to your appointment time for registration. Please follow the written instructions below on the day of your exam:  WARNING: IF YOU ARE ALLERGIC TO IODINE/X-RAY DYE, PLEASE NOTIFY RADIOLOGY IMMEDIATELY AT [phone]! YOU WILL BE GIVEN A 13 HOUR PREMEDICATION PREP.  1) Do not eat or drink anything after 10:00am (4 hours prior to your test)  You may take any medications as prescribed with a small amount of water, if necessary. If you take any of the following medications: METFORMIN, GLUCOPHAGE, GLUCOVANCE, AVANDAMET, RIOMET, FORTAMET, [place] MET, JANUMET, GLUMETZA or METAGLIP, you MAY be asked to HOLD this medication 48 hours AFTER the exam.  The purpose of you drinking the oral contrast is to aid in the visualization of your intestinal tract. The contrast solution may cause some diarrhea. Depending on your individual set of symptoms, you may also receive an intravenous injection of x-ray contrast/dye. Plan on being at [facility] for 30 minutes or longer, depending on the type of exam you are having performed.  This test typically takes 30-45 minutes to complete.  If you have any questions regarding your exam or if you need to reschedule, you may call the CT department at [phone] between the hours of 8:00 am and 5:00 pm, Monday-Friday.  Since you are allergic to one or more components in IV contrast, we have sent a prescription for (3) 50 mg tablets of Prednisone to your pharmacy as a Pre-Medication Prep for your upcoming procedure requiring contrast.    Take (1) 50 mg tablet of prednisone 13 hours prior to your procedure at 1:00am Take (1) 50 mg tablet 7 hours prior to your procedure at 7:00am.    Take (1) 50 mg tablet 1 hour prior to your  procedure at 1:00pm.    You also need to take 50 mg of Benadryl 1 hour prior to your procedure at 1:00pm.  If you have 25 mg tablets of Benadryl, which can be purchased over the counter, you may take 2 tablets.  Otherwise we can send a prescription for (1) 50 mg tablet of Benadryl to your pharmacy.     2. Your provider has requested that you go to the basement level for lab work before leaving today. Press "B" on the elevator. The lab is located at the first door on the left as you exit the elevator.  3.We have sent the following medications to your pharmacy for you to pick up at your convenience: Nexium- don't start nexium until you have submitted stool study for H.Pylori.   Due to recent changes in healthcare laws, you may see the results of your imaging and laboratory studies on MyChart before your provider has had a chance to review them.  We understand that in some cases there may be results that are confusing or concerning to you. Not all laboratory results come back in the same time frame and the provider may be waiting for multiple results in order to interpret others.  Please give [place] 48 hours in order for your provider to thoroughly review all the results before contacting the office for clarification of your results.   4.Hold on to SIBO kit until we have results of you labs-  You have been given a testing kit to check  for small intestine bacterial overgrowth (SIBO) which is completed by a company named [employer]. Make sure to return your test in the mail using the return mailing label given to you along with the kit. Your demographic and insurance information have already been sent to the company and they should be in contact with you over the next week regarding this test. [facility] will collect an upfront charge of $99.74 for commercial insurance plans and $209.74 is you are paying cash. Make sure to discuss with [facility] PRIOR to having the test if they have gotten  informatoin from your insurance company as to how much your testing will cost out of pocket, if any. Please keep in mind that you will be getting a call from phone number [phone] or a similar number. If you do not hear from them within this time frame, please call our office at [phone].   If you are age 22 or older, your body mass index should be between 23-30. Your Body mass index is 30.29 kg/m. If this is out of the aforementioned range listed, please consider follow up with your Primary Care Provider.  If you are age 25 or younger, your body mass index should be between 19-25. Your Body mass index is 30.29 kg/m. If this is out of the aformentioned range listed, please consider follow up with your Primary Care Provider.   Thank you for choosing me and [facility].  Dr. Rush Landmark

## 2020-02-21 NOTE — Progress Notes (Signed)
[facility] VISIT   Primary Care Provider Forrest Moron, MD [address] [place] [address] [phone]  Referring Provider Maximiano Coss, NP [place],  [place] [address] [phone]  Patient Profile: Gwendolyn Nguyen is a 34 y.o. female with a pmh significant for PID complicated by tubo-ovarian abscess and small bowel obstruction, chronic abdominal pain, constipation, endometriosis, recurrent rectal bleeding.  The patient presents to the [facility] for an evaluation and management of problem(s) noted below:  Problem List 1. Chronic generalized abdominal pain   2. Chronic pelvic pain in female   3. Rectal bleeding   4. Endometriosis   5. Other gastritis without hemorrhage, unspecified chronicity   6. History of small bowel obstruction   7. Abnormal endoscopy of upper gastrointestinal tract   8. Abnormal findings on flexible sigmoidoscopy     History of Present Illness This is the patient's first visit to the outpatient [facility] clinic.  She has been followed by Dr. Collene Mares going back to 2018.  She has had recurrent episodes of rectal bleeding going back to 2018 which was initially felt to likely be a result of chronic idiopathic constipation for which Linzess was used.  GERD symptoms were also considered at some point.  She has had follow-up with Dr. Collene Mares in 2018, 2019, 2020.  Her last visit in the GI clinic was in September 2020.  She was set up for an upper and lower endoscopy.  Her upper endoscopy showed evidence of a patent esophagus and GE junction with mild diffuse gastritis and normal duodenum but no biopsies were obtained.  Colonoscopy was performed with findings of polypoid nodular mucosa at 20 cm but a very poor prep so no biopsies were obtained and no discussion as to whether the patient may have had hemorrhoids.  After the procedure, the patient was admitted in October 2020 with findings concerning for tubo-ovarian  abscess with failed outpatient management.  She was eventually taken to the OR for a bilateral salpingectomy with findings of extensive bowel adhesions and the bowel being socked into the pelvis.  Had IV antibiotics for a few weeks.  She has not been able to follow-up with Dr. Collene Mares and had requested a potential transfer of care.  She is followed by [facility] gynecology.  She was recently referred to [facility] advanced endoscopy who performed a lower endoscopic ultrasound with concern for potential abscess although no biopsies were obtained as well as potential endometrioma.  The gynecologist had actually wanted the patient to have a full colonoscopy but that was not performed for an unclear reason.  Had that procedure done just within the last week.  She is not scheduled to follow-up with gynecology as of yet.  Patient is seeking further help in understanding of her symptoms.  Her symptoms include a description of more significant abdominal discomfort in the lower abdomen over the course of the last year.  She has gas and bloating.  She has abdominal distention as well.  She experiences the symptoms on a near daily basis.  They can come and go.  She has to use a heating pad to help with the lower abdominal discomfort.  She does not describe significant constipation currently.  There is plan for potential hysterectomy if that would help with some of her pelvic symptoms but is not clear if that will be the answer to everything.  She is currently on medications that help with some of her discomfort.  She does take Aleve infrequently.  She never got tested for H. pylori or for celiac disease.  She never had a full colonoscopy as outlined above.  She has had relatively stable weight over the course the last few months but did lose weight after her hospitalization.  She is concerned that something is being missed.  She has not had any imaging since October 2020 since her surgery.  GI Review of Systems Positive as above Negative  for dysphagia, odynophagia  Review of Systems General: Denies fevers/chills HEENT: Denies oral lesions Cardiovascular: Denies chest pain Pulmonary: Denies shortness of breath Gastroenterological: See HPI Genitourinary: Denies darkened urine Hematological: Positive for easy bruising/bleeding Endocrine: Denies temperature intolerance Dermatological: Denies jaundice Psychological: Mood is anxious and scared about not having an answer to her chronic pain issues   Medications Current Outpatient Medications  Medication Sig Dispense Refill  . clonazePAM (KLONOPIN) 0.5 MG tablet Take 1 tablet (0.5 mg total) by mouth at bedtime. For GI symptoms. 30 tablet 0  . cyclobenzaprine (FLEXERIL) 5 MG tablet Take 5 mg by mouth 3 (three) times daily as needed for muscle spasms.    . DULoxetine (CYMBALTA) 20 MG capsule Take 20 mg by mouth daily.    Marland Kitchen letrozole (FEMARA) 2.5 MG tablet Take 2.5 mg by mouth daily.    . naproxen sodium (ALEVE) 220 MG tablet Take 220 mg by mouth as needed.    . norethindrone (AYGESTIN) 5 MG tablet Take 5 mg by mouth daily.    . traMADol (ULTRAM) 50 MG tablet Take by mouth every 6 (six) hours as needed.    Marland Kitchen esomeprazole (NEXIUM) 40 MG capsule Take 1 capsule (40 mg total) by mouth daily at 12 noon. 30 capsule 3  . predniSONE (DELTASONE) 50 MG tablet Take as directed prior to CT scan. 3 tablet 0   No current facility-administered medications for this visit.    Allergies Allergies  Allergen Reactions  . Gadolinium Derivatives Hives, Itching and Nausea And Vomiting    Pt vomited immediately during injection.  15 minutes later, pt developed hives and itching.   . Shellfish Allergy Hives  . Sulfa Antibiotics Other (See Comments)    Patient was told to NOT TAKE THIS  . Iohexol Hives and Rash    08-2018, rash and hives, needs pre meds S/W PATIENT STATED REACTION FROM CT CONTRAST 11/19    Histories Past Medical History:  Diagnosis Date  . Anemia   . Back pain   .  Constipation   . Endometriosis   . IBS (irritable bowel syndrome)   . Low hemoglobin   . Multiple food allergies   . PID (acute pelvic inflammatory disease) 39/0300   complicated by tuboovarian abscess   Past Surgical History:  Procedure Laterality Date  . IR RADIOLOGIST EVAL & MGMT  05/19/2019  . IR RADIOLOGIST EVAL & MGMT  05/24/2019  . LAPAROSCOPIC LYSIS OF ADHESIONS N/A 07/24/2019   Procedure: Laparoscopic Lysis Of Adhesions, Drainage of Pelvic Abscess;  Surgeon: Servando Salina, MD;  Location: [facility];  Service: Gynecology;  Laterality: N/A;  . LAPAROSCOPIC LYSIS OF ADHESIONS N/A 07/24/2019   Procedure: Laparoscopic Lysis Of Adhesions and Assist With Drainage of Pelvic Abscess;  Surgeon: Georganna Skeans, MD;  Location: [facility];  Service: General;  Laterality: N/A;  . LAPAROSCOPY N/A 07/24/2019   Procedure: LAPAROSCOPY DIAGNOSTIC;  Surgeon: Servando Salina, MD;  Location: [facility];  Service: Gynecology;  Laterality: N/A;   Social History   Socioeconomic History  . Marital status: Single    Spouse name:  Not on file  . Number of children: Not on file  . Years of education: Not on file  . Highest education level: Not on file  Occupational History  . Occupation: Social worker  Tobacco Use  . Smoking status: Never Smoker  . Smokeless tobacco: Never Used  Substance and Sexual Activity  . Alcohol use: Yes    Comment: occassional  . Drug use: Never  . Sexual activity: Not on file  Other Topics Concern  . Not on file  Social History Narrative  . Not on file   Social Determinants of Health   Financial Resource Strain:   . Difficulty of Paying Living Expenses:   Food Insecurity:   . Worried About Charity fundraiser in the Last Year:   . Arboriculturist in the Last Year:   Transportation Needs:   . Film/video editor (Medical):   Marland Kitchen Lack of Transportation (Non-Medical):   Physical Activity:   . Days of Exercise per Week:   . Minutes of Exercise per Session:   Stress:   .  Feeling of Stress :   Social Connections:   . Frequency of Communication with Friends and Family:   . Frequency of Social Gatherings with Friends and Family:   . Attends Religious Services:   . Active Member of Clubs or Organizations:   . Attends Archivist Meetings:   Marland Kitchen Marital Status:   Intimate Partner Violence:   . Fear of Current or Ex-Partner:   . Emotionally Abused:   Marland Kitchen Physically Abused:   . Sexually Abused:    Family History  Problem Relation Age of Onset  . Hyperlipidemia Mother   . Hypertension Mother   . Obesity Mother   . Hyperlipidemia Father   . Hypertension Father   . Obesity Father   . Diabetes Maternal Grandmother   . Colon cancer Neg Hx   . Esophageal cancer Neg Hx   . Inflammatory bowel disease Neg Hx   . Liver disease Neg Hx   . Pancreatic cancer Neg Hx   . Rectal cancer Neg Hx   . Stomach cancer Neg Hx    I have reviewed her medical, social, and family history in detail and updated the electronic medical record as necessary.    PHYSICAL EXAMINATION  BP 128/88   Pulse (!) 117   Temp 98.5 F (36.9 C)   Ht '5\' 5"'  (1.651 m)   Wt 182 lb (82.6 kg)   SpO2 99%   BMI 30.29 kg/m  Wt Readings from Last 3 Encounters:  02/21/20 182 lb (82.6 kg)  01/02/20 178 lb (80.7 kg)  12/14/19 167 lb 6.4 oz (75.9 kg)  GEN: NAD, appears stated age, doesn't appear chronically ill PSYCH: Cooperative, without pressured speech EYE: Conjunctivae pink, sclerae anicteric ENT: MMM, without oral ulcers NECK: Supple CV: RR without R/Gs  RESP: CTAB posteriorly, without wheezing GI: NABS, soft, tenderness to palpation in mid epigastrium, periumbilical region, lower quadrants bilaterally, no rebound, mild volitional guarding is present  GU: DRE deferred MSK/EXT: No lower extremity edema SKIN: No jaundice NEURO:  Alert & Oriented x 3, no focal deficits   REVIEW OF DATA  I reviewed the following data at the time of this encounter:  GI Procedures and Studies    April 2021 lower EUS Findings: The digital rectal exam findings include firm area palpable thru rectal wall anteriorly. The rectosigmoid colon was relatively fixed and there was question of extrinsic compression of proximal rectum. ENDOSCOPIC FINDING: :  Normal mucosa was found in the entire colon. A few small-mouthed diverticula were found in the sigmoid colon and descending colon. ENDOSONOGRAPHIC FINDING: : A hypoechoic and heterogenous lesion suggestive of an abscess versus endometrioma was identified in the perirectal space. The lesion was visualized endosonographically at 15.0 cm (from the anal verge). The lesion measured 20 mm. Wall thickening was found in the rectum. This was encountered from 15 to 17 cm (from the anal verge). The site of thickening was non-circumferential and located predominantly at the anterior rectal wall and on some views appeared contiguous with uterus. This finding appeared to be primarily due to increased thickness of the muscularis propria (Layer 4). The rectal wall measured up to 7 mm in thickness.                                          Impression:                              - Firm area palpable thru rectal wall anteriorly found              on digital rectal exam.             - Normal mucosa in the entire examined colon.             - Diverticulosis in the sigmoid colon and in the              descending colon.             - A lesion suggestive of an abscess versus              endometrioma was visualized endosonographically in the              perirectal space.             - Wall thickening was visualized endosonographically              in the rectum. This was due to increased thickness of              the muscularis propria (Layer 4) and is concerning for              endometriosis  involving outer rectal wall of proximal              rectum.  September 2020 colonoscopy for indication of blood in stool, iron deficiency anemia, colorectal cancer screening Polypoid nodular mucosa noted at 20 cm most consistent with granulation tissue.  The prep was very poor and no biopsies were obtained. The rest mucosa was normal to 30 cm  September 2020 EGD indication blood in stool, iron deficiency anemia, periumbilical pain The esophagus and GE junction appeared widely patent and normal. Diffuse mild inflammation characterized by erythema, friability, granularity were found in the entire examined stomach. The cardia and gastric fundus were normal retroflexion. The examined duodenum was normal.  Laboratory Studies  Reviewed those in epic and care everywhere  Imaging Studies  October 2020 CT pelvis with contrast (required premedication) IMPRESSION: 1. Essentially no change in the fluid collections in the LEFT and RIGHT adnexa. These fluid collections have enhancing rims and internal gas components consistent with abscesses. Third collection in the deep LEFT pelvis probably communicates with the RIGHT adnexal collection. 2. Percutaneous drain in the ventral LEFT  peritoneal space does not appear to communicate with the above-described collections.  September 2020 CT abdomen pelvis without contrast IMPRESSION: 1. Patient with history complex bilateral adnexal masses, previously characterized as PID, indeterminate density lesions in the adnexa persist, however now contain air and air-fluid levels, and are poorly defined. There is generalized fat stranding in the pelvis with inflammation and free fluid. Findings are concerning for progression of tubo-ovarian abscess. Recommend GYN consultation. 2. Lack of IV and enteric contrast as well as inflammatory change limits detailed assessment of pelvic structures, including pelvic bowel loops. There are questionable foci of  extraluminal air in the pelvis, which may be related to the adnexal process. Given recent colonoscopy, bowel perforation is difficult to exclude on imaging findings alone, however there is no colonic wall thickening to suggest this. 3. Mild bladder wall thickening about the dome, likely reactive. 4. Prominent retroperitoneal lymph nodes are similar to prior exam under likely reactive. 5. No fluid collection in the right pericolic gutter at site of prior percutaneous strain.  September 2020 KUB IMPRESSION: Normal bowel gas pattern, no obstruction. Small to moderate volume of colonic stool.   ASSESSMENT  Ms. Nolley is a 34 y.o. female with a pmh significant for PID complicated by tubo-ovarian abscess and small bowel obstruction, chronic abdominal pain, constipation, endometriosis, recurrent rectal bleeding.  The patient is seen today for evaluation and management of:  1. Chronic generalized abdominal pain   2. Chronic pelvic pain in female   3. Rectal bleeding   4. Endometriosis   5. Other gastritis without hemorrhage, unspecified chronicity   6. History of small bowel obstruction   7. Abnormal endoscopy of upper gastrointestinal tract   8. Abnormal findings on flexible sigmoidoscopy    The patient is hemodynamically stable.  However from a clinical perspective her myriad of symptoms and longevity of symptoms as well as history still has any concern.  She has not had any cross-sectional imaging since her previous surgery.  I am concerned about the potential of some persistent or ongoing issue in her perirectum and pelvic region.  Her most recent lower EUS performed at [facility] by Dr. Flora Lipps had suggested even the possibility of a fluid collection or abscess although nothing was sampled.  She is plan to have follow-up with her gynecologist at [facility] for consideration of hysterectomy in the coming weeks.  I will proceed with a CT abdomen/pelvis with IV and oral contrast.  We will premedicate  the patient as she has a contrast allergy but was able to tolerate 1 during her inpatient evaluation in October 2020 as documented in the system.  Unfortunately, she did not undergo a complete colonoscopy.  I am happy to pursue a complete colonoscopy at some point in the course of the coming weeks to months to further evaluate and exclude other etiologies.  One would query whether endometriosis is causing her recurrent rectal bleeding although she does not describe this to be ongoing near the time of her.  But it is something that could be an etiology.  This is the case, then hysterectomy is potentially planned as well as potential treatment from that perspective may help with decreasing her rectal bleeding.  We deferred a DRE today because of the overall clinical symptoms and time as the patient was also 10 minutes late for her appointment.  At this point we will draw some laboratories as well as draw inflammatory markers to further characterize what may be going on.  After she gives an H. pylori  stool antigen I will initiate her on PPI to see if that may be of some assistance to her.  After the work-up and imaging is completed we will consider neck steps in her evaluation.  I would do my best to try and find and elucidate etiology of her symptoms but she may require referral back to [facility] for tertiary/quaternary care evaluation if I am unable to help her.  SIBO evaluation EPI evaluation are reasonable and we will consider that as well.  All patient questions were answered, to the best of my ability, and the patient agrees to the aforementioned plan of action with follow-up as indicated.  PLAN  Laboratory evaluation as outlined below Inflammatory marker evaluation as outlined below H. pylori stool antigen to be obtained PPI therapy with Nexium once daily once H. pylori stool antigen has been given We will consider repeat endoscopic evaluation from below and above depending on symptoms that she did not have a  complete colonoscopy CT abdomen/pelvis with IV and oral contrast with premedication and effort of further defining her symptoms post her surgery in October 2020 Fecal elastase testing Consider in future SIBO evaluation   Orders Placed This Encounter  Procedures  . Helicobacter pylori special antigen  . CT Abdomen Pelvis W Contrast  . CBC  . Comp Met (CMET)  . TSH  . Cortisol  . Sedimentation rate  . CRP High sensitivity  . IgA  . Tissue transglutaminase, IgA  . Pancreatic Elastase, Fecal    New Prescriptions   ESOMEPRAZOLE (NEXIUM) 40 MG CAPSULE    Take 1 capsule (40 mg total) by mouth daily at 12 noon.   PREDNISONE (DELTASONE) 50 MG TABLET    Take as directed prior to CT scan.   Modified Medications   No medications on file    Planned Follow Up No follow-ups on file.   Total Time in Face-to-Face and in Coordination of Care for patient including independent/personal interpretation/review of prior testing, medical history, examination, medication adjustment, communicating results with the patient directly, and documentation with the EHR is 50 minutes.   Justice Britain, MD [facility] Advanced Endoscopy Office # [phone]

## 2020-02-22 ENCOUNTER — Encounter: Payer: Self-pay | Admitting: Gastroenterology

## 2020-02-22 ENCOUNTER — Other Ambulatory Visit: Payer: Self-pay

## 2020-02-22 ENCOUNTER — Other Ambulatory Visit: Payer: BC Managed Care – PPO

## 2020-02-22 DIAGNOSIS — N809 Endometriosis, unspecified: Secondary | ICD-10-CM | POA: Insufficient documentation

## 2020-02-22 DIAGNOSIS — K625 Hemorrhage of anus and rectum: Secondary | ICD-10-CM | POA: Insufficient documentation

## 2020-02-22 DIAGNOSIS — K2970 Gastritis, unspecified, without bleeding: Secondary | ICD-10-CM | POA: Insufficient documentation

## 2020-02-22 DIAGNOSIS — G8929 Other chronic pain: Secondary | ICD-10-CM | POA: Insufficient documentation

## 2020-02-22 DIAGNOSIS — D804 Selective deficiency of immunoglobulin M [IgM]: Secondary | ICD-10-CM

## 2020-02-22 DIAGNOSIS — R933 Abnormal findings on diagnostic imaging of other parts of digestive tract: Secondary | ICD-10-CM | POA: Insufficient documentation

## 2020-02-22 DIAGNOSIS — D802 Selective deficiency of immunoglobulin A [IgA]: Secondary | ICD-10-CM

## 2020-02-22 DIAGNOSIS — Z8719 Personal history of other diseases of the digestive system: Secondary | ICD-10-CM

## 2020-02-22 DIAGNOSIS — K2960 Other gastritis without bleeding: Secondary | ICD-10-CM

## 2020-02-22 DIAGNOSIS — R102 Pelvic and perineal pain: Secondary | ICD-10-CM | POA: Insufficient documentation

## 2020-02-22 LAB — TISSUE TRANSGLUTAMINASE, IGA: (tTG) Ab, IgA: 1 U/mL

## 2020-02-28 ENCOUNTER — Other Ambulatory Visit: Payer: Self-pay

## 2020-02-28 ENCOUNTER — Ambulatory Visit (INDEPENDENT_AMBULATORY_CARE_PROVIDER_SITE_OTHER)
Admission: RE | Admit: 2020-02-28 | Discharge: 2020-02-28 | Disposition: A | Discharge status: Home / Self Care | Payer: BC Managed Care – PPO | Source: Ambulatory Visit | Attending: Gastroenterology | Admitting: Gastroenterology

## 2020-02-28 DIAGNOSIS — K625 Hemorrhage of anus and rectum: Secondary | ICD-10-CM | POA: Diagnosis not present

## 2020-02-28 DIAGNOSIS — N809 Endometriosis, unspecified: Secondary | ICD-10-CM | POA: Diagnosis not present

## 2020-02-28 DIAGNOSIS — K2960 Other gastritis without bleeding: Secondary | ICD-10-CM | POA: Diagnosis not present

## 2020-02-28 DIAGNOSIS — Z8719 Personal history of other diseases of the digestive system: Secondary | ICD-10-CM | POA: Diagnosis not present

## 2020-02-28 MED ORDER — IOHEXOL 300 MG/ML  SOLN
100.0000 mL | Freq: Once | INTRAMUSCULAR | Status: AC | PRN
  Administered 2020-02-28: 14:00:00 100 mL via INTRAVENOUS

## 2020-02-28 MED ORDER — DIPHENHYDRAMINE HCL 50 MG/ML IJ SOLN: 50.0000 mg | Freq: Once | INTRAMUSCULAR | Status: DC

## 2020-02-28 MED ORDER — PREDNISONE 1 MG PO TABS
50.0000 mg | ORAL_TABLET | Freq: Four times a day (QID) | ORAL | Status: DC
Start: 2020-02-28 — End: 2020-02-29

## 2020-02-28 MED ORDER — DIPHENHYDRAMINE HCL 25 MG PO CAPS
50.0000 mg | ORAL_CAPSULE | Freq: Once | ORAL | Status: DC
Start: 2020-02-28 — End: 2020-02-29

## 2020-02-29 ENCOUNTER — Other Ambulatory Visit: Payer: Self-pay

## 2020-02-29 LAB — HELICOBACTER PYLORI  SPECIAL ANTIGEN
MICRO NUMBER:: 10442132
SPECIMEN QUALITY: ADEQUATE

## 2020-02-29 LAB — PANCREATIC ELASTASE, FECAL: Pancreatic Elastase-1, Stool: >500 mcg/g

## 2020-02-29 MED ORDER — DOXYCYCLINE HYCLATE 100 MG PO TBEC: 100.0000 mg | DELAYED_RELEASE_TABLET | Freq: Two times a day (BID) | ORAL | 0 refills | Status: AC

## 2020-03-08 DIAGNOSIS — N739 Female pelvic inflammatory disease, unspecified: Secondary | ICD-10-CM | POA: Insufficient documentation

## 2020-03-14 ENCOUNTER — Encounter: Payer: Self-pay | Admitting: Registered Nurse

## 2020-04-03 ENCOUNTER — Telehealth: Payer: Self-pay | Admitting: Family Medicine

## 2020-04-03 NOTE — Telephone Encounter (Signed)
Verbal Order Request  Name of [facility] home health   Name of [facility]  PCP [place]   What Orders are being requested:home care orders for medical education

## 2020-04-03 NOTE — Telephone Encounter (Signed)
Spoke with Langley Gauss from [facility] and gave her the verbal ok for pt.

## 2020-04-11 ENCOUNTER — Telehealth: Payer: Self-pay | Admitting: Family Medicine

## 2020-04-11 NOTE — Telephone Encounter (Signed)
Gave Gwendolyn Nguyen verbal orders to continue care for pt.

## 2020-04-11 NOTE — Telephone Encounter (Signed)
Angel from [place] wants to resume pt care 1 time a week for 6 weeks, that is for colostomy care, med teaching, diet and fluid teaching. This can be done with a verbal auth. Please advise Angel at [phone].

## 2020-04-12 IMAGING — CT CT ABDOMEN AND PELVIS WITHOUT CONTRAST
2 of 4 series · 11 of 46 positions shown, 12 images · non-contrast
Comparison: 05/10/2019, MR 05/08/2019, CT May 02, 2011

CLINICAL DATA: 33-year-old female with a history of tubal abscess,
status post CT-guided drainage 05/10/2019

EXAM:
CT ABDOMEN AND PELVIS WITHOUT CONTRAST
TECHNIQUE: Multidetector CT imaging of the abdomen and pelvis was performed
following the standard protocol without IV contrast.

[Series 2: routine abdomen pelvis without 5.00 br40 s3 axial · axial · non-contrast · 0.54mm/px · z∈[+1340,+1710]mm · 8 of 90 slices shown, 9 images]
[im 8/90  soft-tissue]
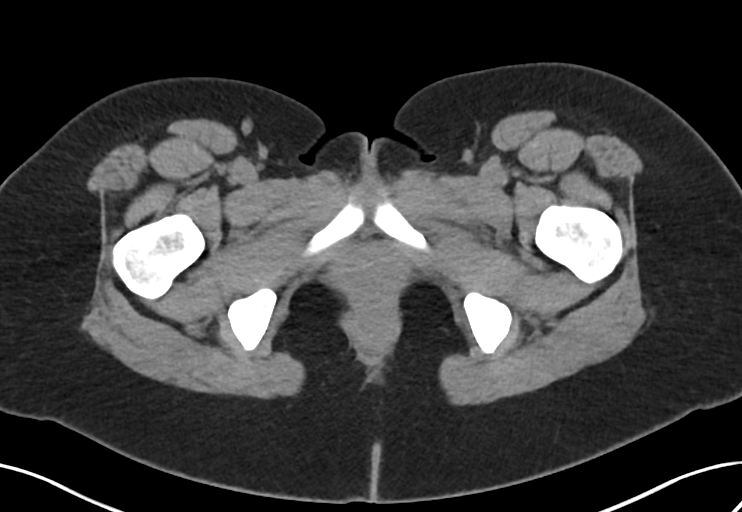
[im 8/90  bone]
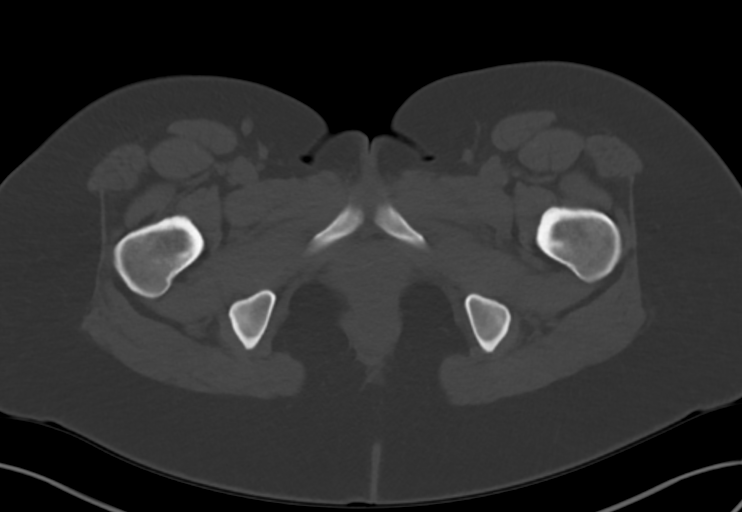
[im 19/90  soft-tissue]
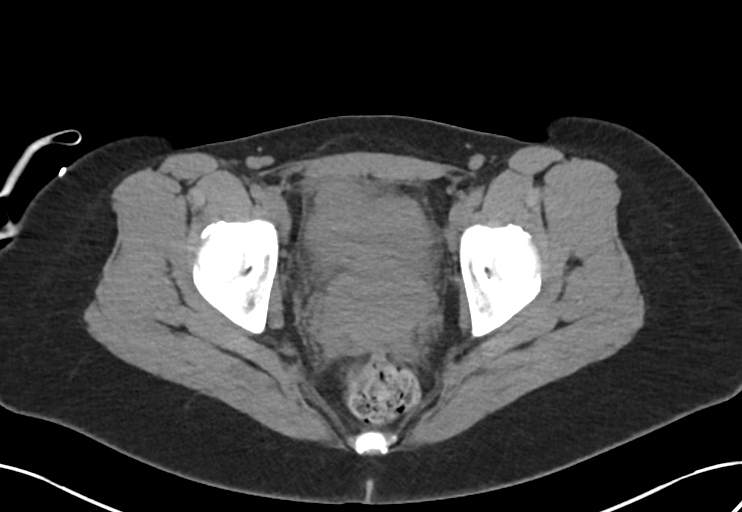
[im 30/90  soft-tissue]
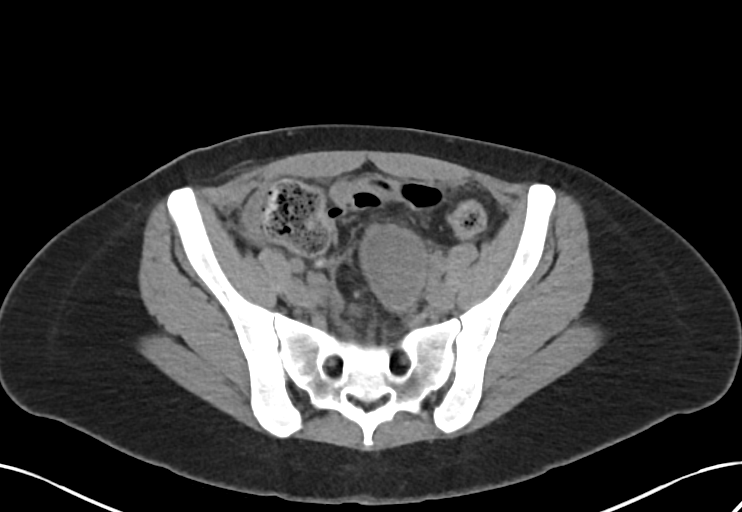
[im 41/90  soft-tissue]
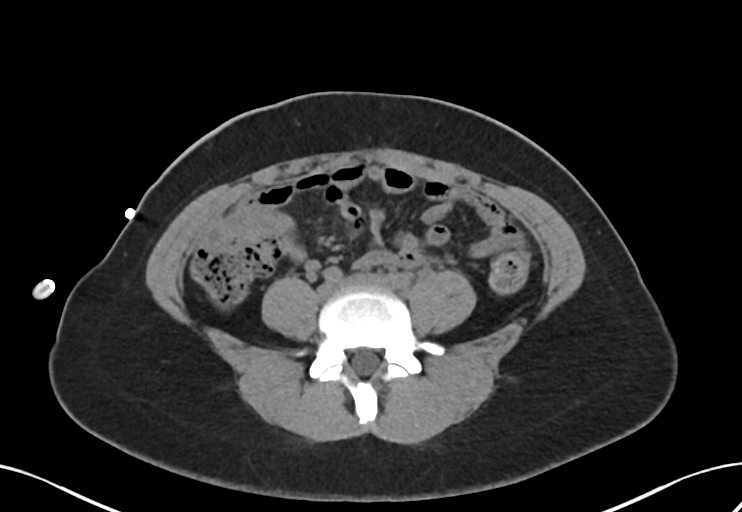
[im 49/90  soft-tissue]
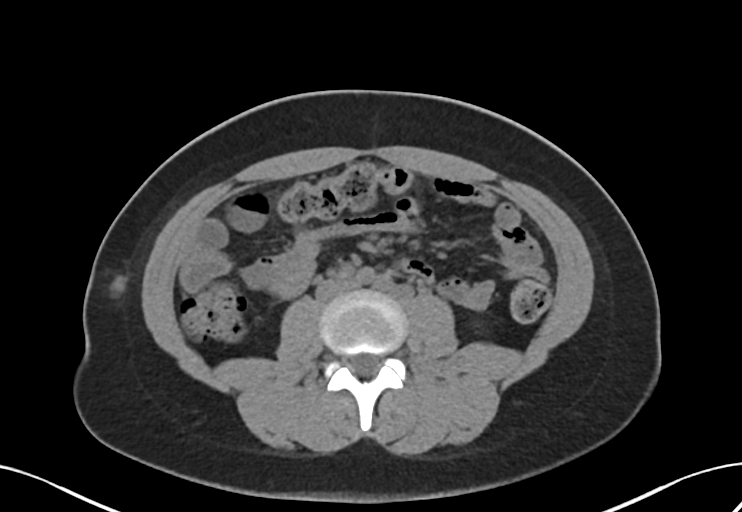
[im 60/90  soft-tissue]
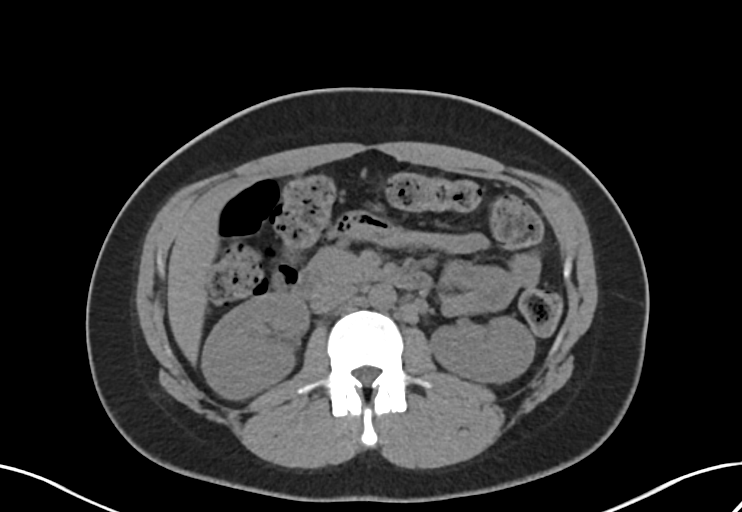
[im 71/90  soft-tissue]
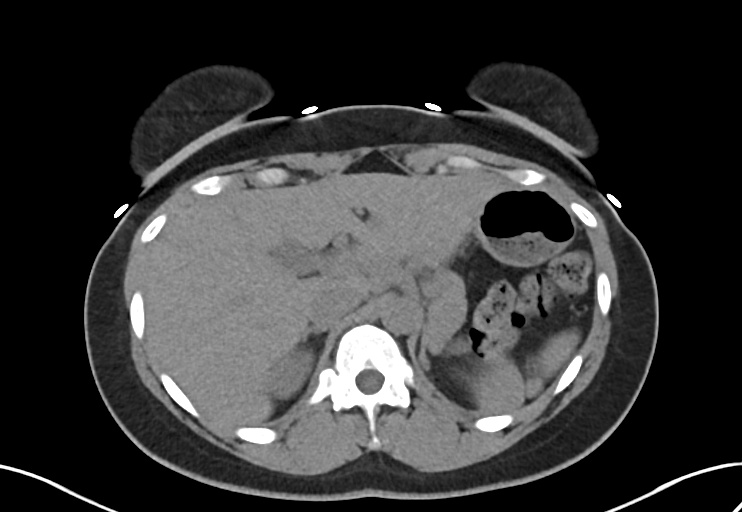
[im 82/90  soft-tissue]
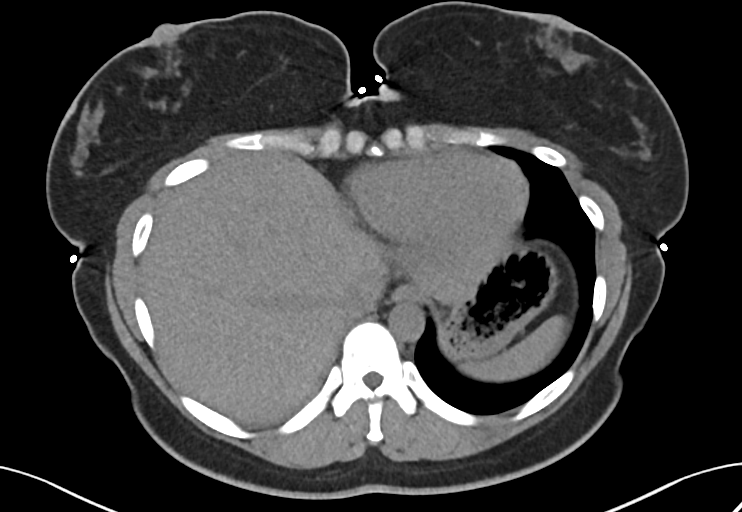

[Series 4: routine abdomen pelvis without 2.00 br40 s3 cor · coronal · non-contrast · 0.79mm/px · 3 of 138 slices shown]
[im 46/138  soft-tissue]
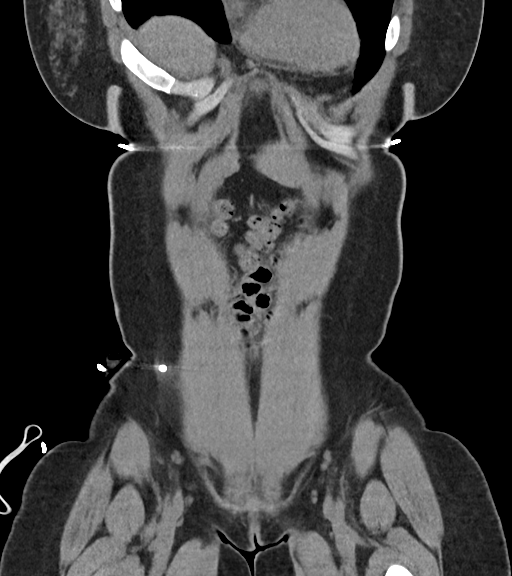
[im 61/138  soft-tissue]
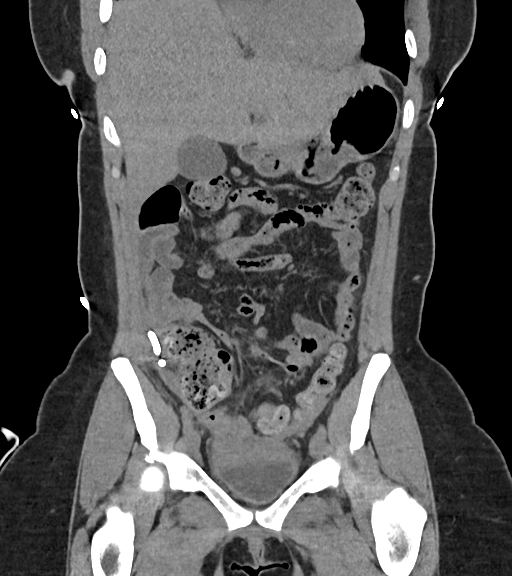
[im 77/138  soft-tissue]
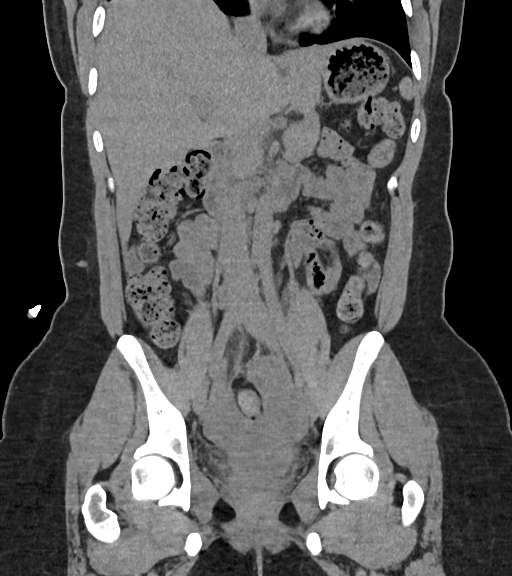

[11 of 46 positions shown; findings below may reference images not displayed]

FINDINGS: Lower chest: No acute abnormality.

Hepatobiliary: Unremarkable liver.  Unremarkable gallbladder

Pancreas: Unremarkable

Spleen: Unremarkable

Adrenals/Urinary Tract: Unremarkable appearance of the adrenal
glands. No evidence of hydronephrosis of the right or left kidney.
No nephrolithiasis. Unremarkable course of the bilateral ureters.
Unremarkable appearance of the urinary bladder.

Stomach/Bowel: Unremarkable stomach. Unremarkable small bowel which
is decompressed. No transition point. Normal appendix. Moderate
stool burden with no colonic obstruction. No focal inflammatory
changes.

Vascular/Lymphatic: No significant vascular calcifications. Likely
reactive lymph nodes in the retroperitoneal region, predominantly
along the right iliac and periaortic nodal station. Small inguinal
lymph nodes.

Reproductive: Unchanged appearance of uterus. The intermediate
density cystic lesion associated with the left adnexa is unchanged
in greatest diameter from prior, approximately 4.7 cm.

Intermediate density fluid collection within the right pelvis
adjacent to the terminal ileum is decompressed.

Fluid collection along the right pericolic gutter is essentially
resolved with sliver of fluid adjacent to the drain catheter.

Edema/mild inflammatory changes within the surfaces of the
peritoneum of the low abdomen/pelvis. No dependent fluid within the
recto uterine space.

Right adnexa not well evaluated given the absence of IV contrast
however, the size of the right adnexa not enlarged since the prior.

Other: None

Musculoskeletal: No acute displaced fracture. No significant
degenerative changes.
IMPRESSION: Right abdominal/pelvic abscess is essentially resolved, with pigtail
drain catheter terminating in the right pericolic gutter.

The intermediate density cyst/lesion of the left adnexa is unchanged
measuring 4.7 cm. Differential includes endometrioma, complex
ovarian lesion, or persisting infection, hematosalpinx.

Mild reactive changes/edema within the peritoneum of the lower
abdomen/pelvis, with associated reactive lymph nodes.

## 2020-04-13 HISTORY — PX: TOTAL ABDOMINAL HYSTERECTOMY W/ BILATERAL SALPINGOOPHORECTOMY: SHX83

## 2020-05-16 ENCOUNTER — Other Ambulatory Visit: Payer: Self-pay | Admitting: Gastroenterology

## 2020-06-08 ENCOUNTER — Telehealth: Payer: Self-pay

## 2020-06-08 NOTE — Telephone Encounter (Signed)
Sent fax to let [facility] know that Gwendolyn Nguyen is no longer at this facility. Receive forms that needed to be faxed regarding pt care. Pt only saw [facility] while at this practice. Faxed to [phone]

## 2020-07-11 ENCOUNTER — Encounter: Payer: Self-pay | Admitting: Family Medicine

## 2020-07-25 ENCOUNTER — Ambulatory Visit: Payer: BC Managed Care – PPO | Attending: Obstetrics and Gynecology | Admitting: Physical Therapy

## 2020-07-25 ENCOUNTER — Encounter: Payer: Self-pay | Admitting: Physical Therapy

## 2020-07-25 ENCOUNTER — Other Ambulatory Visit: Payer: Self-pay

## 2020-07-25 DIAGNOSIS — R262 Difficulty in walking, not elsewhere classified: Secondary | ICD-10-CM | POA: Insufficient documentation

## 2020-07-25 DIAGNOSIS — M6281 Muscle weakness (generalized): Secondary | ICD-10-CM | POA: Diagnosis present

## 2020-07-25 NOTE — Therapy (Signed)
[place], [place], [address] Phone: [phone]   Fax:  [phone]  Physical Therapy Evaluation  Patient Details  Name: Gwendolyn Nguyen MRN: [id number] Date of Birth: 1986-05-02 Referring Provider (PT): Amalia Hailey, MD   Encounter Date: 07/25/2020   PT End of Session - 07/25/20 1552    Visit Number 1    Number of Visits 30    Date for PT Re-Evaluation 10/25/20    Authorization Type BCBS    PT Start Time 1540    PT Stop Time 1627    PT Time Calculation (min) 47 min    Activity Tolerance Patient tolerated treatment well    Behavior During Therapy [facility] for tasks assessed/performed           Past Medical History:  Diagnosis Date  . Anemia   . Back pain   . Constipation   . Endometriosis   . IBS (irritable bowel syndrome)   . Low hemoglobin   . Multiple food allergies   . PID (acute pelvic inflammatory disease) 33/2951   complicated by tuboovarian abscess    Past Surgical History:  Procedure Laterality Date  . IR RADIOLOGIST EVAL & MGMT  05/19/2019  . IR RADIOLOGIST EVAL & MGMT  05/24/2019  . LAPAROSCOPIC LYSIS OF ADHESIONS N/A 07/24/2019   Procedure: Laparoscopic Lysis Of Adhesions, Drainage of Pelvic Abscess;  Surgeon: Servando Salina, MD;  Location: [place];  Service: Gynecology;  Laterality: N/A;  . LAPAROSCOPIC LYSIS OF ADHESIONS N/A 07/24/2019   Procedure: Laparoscopic Lysis Of Adhesions and Assist With Drainage of Pelvic Abscess;  Surgeon: Georganna Skeans, MD;  Location: [place];  Service: General;  Laterality: N/A;  . LAPAROSCOPY N/A 07/24/2019   Procedure: LAPAROSCOPY DIAGNOSTIC;  Surgeon: Servando Salina, MD;  Location: [place];  Service: Gynecology;  Laterality: N/A;  . TOTAL ABDOMINAL HYSTERECTOMY W/ BILATERAL SALPINGOOPHORECTOMY  04/13/2020   full ectocervix not removed  - NEEDS PAP (done at [facility] for endometriosis)    There were no vitals filed for this visit.    Subjective  Assessment - 07/25/20 1545    Subjective I have been in the hospital a lot with multiple surgeries, one being full hysterectomy. Illiostomy bag has been gone for a while. 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 I wake up exhausted and then gets worse over the day. If I stay in the bed all day Sat, I might feel ok Sunday. all of my muscles are sore and tender. I am weening from pain meds and do not really see any difference. other meds make me really sleeping but cannot sleep wihtout them due to pain. Swedish massage every other week which are helpful. Tried a little bit of exercise with arms from PT before, avoided working core & LEs. Up until last week I was stuck in bathroom bc door was so heavy and my muscles were so weak. Able to walk around at work and up/down stairs at home. Feels SOB with activity but usually only with stairsrather than just walking.    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 Yes    Pain Score 7     Pain Location --   general muscle pain   Pain Descriptors / Indicators Aching    Aggravating Factors  activity to fatigue    Pain Relieving Factors rest,  massage              [facility] PT Assessment - 07/25/20 0001      Assessment   Medical Diagnosis physical deconditioning    Referring Provider (PT) Amalia Hailey, MD    Onset Date/Surgical Date --   chronic c multiple hospitalizations & surgeries in last 80mo   Hand Dominance Right    Prior Therapy yes      Precautions   Precautions None      Restrictions   Weight Bearing Restrictions No      Balance Screen   Has the patient fallen in the past 6 months Yes    How many times? <5    Has the patient had a decrease in activity level because of a fear of falling?  Yes    Is the patient reluctant to leave their home because of a fear of falling?  No      Home Manufacturing systems engineer residence    Living Arrangements Alone    Additional Comments two sets of stairs      Prior Function   Level of Independence Independent    Vocation Full time employment      Cognition   Overall Cognitive Status Within Functional Limits for tasks assessed      Observation/Other Assessments   Focus on Therapeutic Outcomes (FOTO)  39% limited      Sensation   Additional Comments was having some tingling in extremities, mostly Rt arm, helped by massages and acupuncture      Special Tests   Other special tests 5TSTS 26s, short physical performance battery 8/12      Ambulation/Gait   Gait Comments no AD      6 Minute Walk- Baseline   6 Minute Walk- Baseline yes    BP (mmHg) 111/92    HR (bpm) 90    02 Sat (%RA) --   pulse ox not reading   Modified Borg Scale for Dyspnea 0- Nothing at all    Perceived Rate of Exertion (Borg) 6-      6 Minute walk- Post Test   6 Minute Walk Post Test yes    BP (mmHg) 125/95    HR (bpm) 94    02 Sat (%RA) --   pulse ox not reading   Modified Borg Scale for Dyspnea 5- Strong or hard breathing    Perceived Rate of Exertion (Borg) 13- Somewhat hard      6 minute walk test results    Aerobic Endurance Distance Walked 1035    Endurance additional comments no rest  breaks                      Objective measurements completed on examination: See above findings.               PT Education - 07/25/20 1734    Education Details anatomy of condition, POC, HEP, FOTO    Person(s) Educated Patient    Methods Explanation    Comprehension Verbalized understanding;Need further instruction            PT Short Term Goals - 07/25/20 1741      PT SHORT TERM GOAL #1   Title pt will be independent in HEP as it has been established in the short term    Baseline will progress as appropriate    Time 6    Period Weeks    Status New  Target Date 09/05/20      PT SHORT TERM GOAL #2   Title pt will demo  proper core engagement in supine, seated and standing    Baseline unable properly at eval    Time 6    Period Weeks    Status New    Target Date 09/05/20      PT SHORT TERM GOAL #3   Title pt will be able to demo good form to squat to grab small objects from floor    Baseline compensates at eval due to difficulty/pain    Time 6    Period Weeks    Status New    Target Date 09/05/20             PT Long Term Goals - 07/25/20 1749      PT LONG TERM GOAL #1   Title pt will be able to lift case of water from [place] unable at eval    Time 3    Period Months    Status New    Target Date 10/25/20      PT LONG TERM GOAL #2   Title 6MWT at least 1300 ft    Baseline 1035 at eval    Time 3    Period Months    Status New    Target Date 10/25/20      PT LONG TERM GOAL #3   Title 5TSTS 12s or less    Baseline 26s at eval    Time 3    Period Months    Status New    Target Date 10/25/20      PT LONG TERM GOAL #4   Title pt will use stairs at work    Baseline unable and uses elevator due to fatigue    Time 3    Period Months    Status New    Target Date 10/25/20      PT LONG TERM GOAL #5   Title pt will be independent in safe, challenging core strength program    Baseline none established at this time    Time 3    Period Months    Status New    Target Date 10/25/20                  Plan - 07/25/20 1735    Clinical Impression Statement Pt presents to PT with complaints of limited functional endurance due to physical deconditioning following multiple surgeries and hospitalizations due to infection. I have treated this patient in the past for deconditioning but ultimately required further surgical intervention that further decreased her functional endurance. Ultimately I plan for her POC to be about 6 months but will do a full re-evaluation at 3 months to determine progress. 6MWT distance is very low as would be expected for her age and reported  significant fatigue on RPE scale. Diastolic BP is running high in the 90s and will continue to be monitored. Gross strength is decreased and requires attention for functional lifting. will benefit from skilled progression of core strengthening but will require slow progression due to history of abdominal surgical interventions. Pt will benefit from skilled PT to return to functional and age-appropriate levels to meet functional goals.    Personal Factors and Comorbidities Time since onset of injury/illness/exacerbation;Comorbidity 3+    Comorbidities anemia, low hemoglobin, h/o multiple surgeries & abdominal infection    Examination-Activity Limitations Reach Overhead;Bend;Sit;Sleep;Carry;Squat;Stairs;Stand;Lift;Locomotion Level    Examination-Participation Restrictions Occupation;Cleaning;Shop;Laundry;Meal Prep  Stability/Clinical Decision Making Unstable/Unpredictable    Clinical Decision Making High    Rehab Potential Good    PT Frequency 2x / week    PT Duration --   3 months   PT Treatment/Interventions ADLs/Self Care Home Management;Cryotherapy;Electrical Stimulation;Gait training;Moist Heat;Stair training;Functional mobility training;Therapeutic activities;Therapeutic exercise;Aquatic Therapy;Neuromuscular re-education;Manual techniques;Patient/family education;Passive range of motion;Dry needling;Taping    PT Next Visit Plan create UE HEP, begin core strengthening    PT Home Exercise Plan continue with light upper body weights, walking    Consulted and Agree with Plan of Care Patient           Patient will benefit from skilled therapeutic intervention in order to improve the following deficits and impairments:  Difficulty walking, Increased muscle spasms, Impaired UE functional use, Decreased endurance, Cardiopulmonary status limiting activity, Decreased activity tolerance, Pain, Decreased strength  Visit Diagnosis: Difficulty in walking, not elsewhere classified - Plan: PT plan of  care cert/re-cert  Muscle weakness (generalized) - Plan: PT plan of care cert/re-cert     Problem List Patient Active Problem List   Diagnosis Date Noted  . Abnormal findings on flexible sigmoidoscopy 02/22/2020  . Abnormal endoscopy of upper gastrointestinal tract 02/22/2020  . History of small bowel obstruction 02/22/2020  . Gastritis without bleeding 02/22/2020  . Endometriosis 02/22/2020  . Rectal bleeding 02/22/2020  . Chronic pelvic pain in female 02/22/2020  . Chronic generalized abdominal pain 02/22/2020  . TOA (tubo-ovarian abscess) 07/24/2019  . Tubo-ovarian abscess 07/20/2019  . Abdominal pain 07/20/2019  . Physical deconditioning 05/14/2019  . PID (pelvic inflammatory disease) 05/14/2019  . Small bowel obstruction ([place])   . Pelvic mass 05/02/2019  . Elevated glucose 11/29/2018  . Class 1 obesity with serious comorbidity and body mass index (BMI) of 30.0 to 30.9 in adult 11/29/2018   Polk Minor C. Merdith Adan PT, DPT 07/25/20 6:03 PM   [facility] [address] [place], [place], [address] Phone: [phone]   Fax:  [phone]  Name: Rhiann Boucher MRN: [id number] Date of Birth: 06/21/1986

## 2020-08-08 ENCOUNTER — Encounter: Payer: Self-pay | Admitting: Physical Therapy

## 2020-08-08 ENCOUNTER — Other Ambulatory Visit: Payer: Self-pay

## 2020-08-08 ENCOUNTER — Ambulatory Visit: Payer: BC Managed Care – PPO | Admitting: Physical Therapy

## 2020-08-08 DIAGNOSIS — R262 Difficulty in walking, not elsewhere classified: Secondary | ICD-10-CM | POA: Diagnosis not present

## 2020-08-08 DIAGNOSIS — M6281 Muscle weakness (generalized): Secondary | ICD-10-CM

## 2020-08-08 NOTE — Therapy (Signed)
[place], [place], [address] Phone: [phone]   Fax:  [phone]  Physical Therapy Treatment  Patient Details  Name: Gwendolyn Nguyen MRN: [id number] Date of Birth: 06/05/1986 Referring Provider (PT): Amalia Hailey, MD   Encounter Date: 08/08/2020   PT End of Session - 08/08/20 1727    Visit Number 2    Number of Visits 30    Date for PT Re-Evaluation 10/25/20    Authorization Type BCBS    PT Start Time 1721   pt arrived late   PT Stop Time 1802    PT Time Calculation (min) 41 min    Activity Tolerance Patient tolerated treatment well    Behavior During Therapy [facility] for tasks assessed/performed           Past Medical History:  Diagnosis Date  . Anemia   . Back pain   . Constipation   . Endometriosis   . IBS (irritable bowel syndrome)   . Low hemoglobin   . Multiple food allergies   . PID (acute pelvic inflammatory disease) 69/6295   complicated by tuboovarian abscess    Past Surgical History:  Procedure Laterality Date  . IR RADIOLOGIST EVAL & MGMT  05/19/2019  . IR RADIOLOGIST EVAL & MGMT  05/24/2019  . LAPAROSCOPIC LYSIS OF ADHESIONS N/A 07/24/2019   Procedure: Laparoscopic Lysis Of Adhesions, Drainage of Pelvic Abscess;  Surgeon: Servando Salina, MD;  Location: [place];  Service: Gynecology;  Laterality: N/A;  . LAPAROSCOPIC LYSIS OF ADHESIONS N/A 07/24/2019   Procedure: Laparoscopic Lysis Of Adhesions and Assist With Drainage of Pelvic Abscess;  Surgeon: Georganna Skeans, MD;  Location: [place];  Service: General;  Laterality: N/A;  . LAPAROSCOPY N/A 07/24/2019   Procedure: LAPAROSCOPY DIAGNOSTIC;  Surgeon: Servando Salina, MD;  Location: [place];  Service: Gynecology;  Laterality: N/A;  . TOTAL ABDOMINAL HYSTERECTOMY W/ BILATERAL SALPINGOOPHORECTOMY  04/13/2020   full ectocervix not removed  - NEEDS PAP (done at [facility] for endometriosis)    There were no vitals filed for this  visit.                      [facility] Adult PT Treatment/Exercise - 08/08/20 0001      Exercises   Exercises Knee/Hip;Shoulder      Knee/Hip Exercises: Stretches   Gastroc Stretch Both;2 reps;30 seconds      Knee/Hip Exercises: Aerobic   Nustep 5 min L5      Knee/Hip Exercises: Standing   Functional Squat 3 sets;10 reps    Functional Squat Limitations pull off of FM bar    SLS 3x30s each side      Knee/Hip Exercises: Supine   Other Supine Knee/Hip Exercises isometric physioball press- center & unilateral, with mini roll up    Other Supine Knee/Hip Exercises dead bug ext      Shoulder Exercises: Seated   External Rotation Strengthening;Both;15 reps                    PT Short Term Goals - 07/25/20 1741      PT SHORT TERM GOAL #1   Title pt will be independent in HEP as it has been established in the short term    Baseline will progress as appropriate    Time 6    Period Weeks    Status New    Target Date 09/05/20      PT SHORT TERM GOAL #2   Title  pt will demo proper core engagement in supine, seated and standing    Baseline unable properly at eval    Time 6    Period Weeks    Status New    Target Date 09/05/20      PT SHORT TERM GOAL #3   Title pt will be able to demo good form to squat to grab small objects from floor    Baseline compensates at eval due to difficulty/pain    Time 6    Period Weeks    Status New    Target Date 09/05/20             PT Long Term Goals - 07/25/20 1749      PT LONG TERM GOAL #1   Title pt will be able to lift case of water from [place] unable at eval    Time 3    Period Months    Status New    Target Date 10/25/20      PT LONG TERM GOAL #2   Title 6MWT at least 1300 ft    Baseline 1035 at eval    Time 3    Period Months    Status New    Target Date 10/25/20      PT LONG TERM GOAL #3   Title 5TSTS 12s or less    Baseline 26s at eval    Time 3    Period Months    Status New     Target Date 10/25/20      PT LONG TERM GOAL #4   Title pt will use stairs at work    Baseline unable and uses elevator due to fatigue    Time 3    Period Months    Status New    Target Date 10/25/20      PT LONG TERM GOAL #5   Title pt will be independent in safe, challenging core strength program    Baseline none established at this time    Time 3    Period Months    Status New    Target Date 10/25/20                 Plan - 08/08/20 1755    Clinical Impression Statement Good tolerance to exercise today with notable difference in difficulty using Left obliques vs Rt. Fatigued overall without pain and advised to watch for DOMS.    PT Treatment/Interventions ADLs/Self Care Home Management;Cryotherapy;Electrical Stimulation;Gait training;Moist Heat;Stair training;Functional mobility training;Therapeutic activities;Therapeutic exercise;Aquatic Therapy;Neuromuscular re-education;Manual techniques;Patient/family education;Passive range of motion;Dry needling;Taping    PT Home Exercise Plan continue with light upper body weights, walking, [id number]    Consulted and Agree with Plan of Care Patient           Patient will benefit from skilled therapeutic intervention in order to improve the following deficits and impairments:  Difficulty walking, Increased muscle spasms, Impaired UE functional use, Decreased endurance, Cardiopulmonary status limiting activity, Decreased activity tolerance, Pain, Decreased strength  Visit Diagnosis: Difficulty in walking, not elsewhere classified  Muscle weakness (generalized)     Problem List Patient Active Problem List   Diagnosis Date Noted  . Abnormal findings on flexible sigmoidoscopy 02/22/2020  . Abnormal endoscopy of upper gastrointestinal tract 02/22/2020  . History of small bowel obstruction 02/22/2020  . Gastritis without bleeding 02/22/2020  . Endometriosis 02/22/2020  . Rectal bleeding 02/22/2020  . Chronic pelvic pain in  female 02/22/2020  . Chronic generalized abdominal pain  02/22/2020  . TOA (tubo-ovarian abscess) 07/24/2019  . Tubo-ovarian abscess 07/20/2019  . Abdominal pain 07/20/2019  . Physical deconditioning 05/14/2019  . PID (pelvic inflammatory disease) 05/14/2019  . Small bowel obstruction ([place])   . Pelvic mass 05/02/2019  . Elevated glucose 11/29/2018  . Class 1 obesity with serious comorbidity and body mass index (BMI) of 30.0 to 30.9 in adult 11/29/2018   Shayanne Gomm C. Blondie Riggsbee PT, DPT 08/08/20 6:08 PM   [place] [facility] [address] [place], [place], [address] Phone: [phone]   Fax:  [phone]  Name: Jacquetta Polhamus MRN: [id number] Date of Birth: 15-Jul-1986

## 2020-08-13 ENCOUNTER — Encounter: Payer: Self-pay | Admitting: Registered Nurse

## 2020-08-13 ENCOUNTER — Ambulatory Visit: Payer: BC Managed Care – PPO | Admitting: Registered Nurse

## 2020-08-13 ENCOUNTER — Other Ambulatory Visit: Payer: Self-pay

## 2020-08-13 VITALS — BP 135/93 | HR 88 | Temp 98.0°F | Resp 18 | Ht 66.0 in | Wt 189.0 lb

## 2020-08-13 DIAGNOSIS — R5383 Other fatigue: Secondary | ICD-10-CM

## 2020-08-13 DIAGNOSIS — Z90710 Acquired absence of both cervix and uterus: Secondary | ICD-10-CM

## 2020-08-13 DIAGNOSIS — N390 Urinary tract infection, site not specified: Secondary | ICD-10-CM | POA: Diagnosis not present

## 2020-08-13 LAB — POCT URINALYSIS DIP (MANUAL ENTRY)
Bilirubin, UA: NEGATIVE
Glucose, UA: NEGATIVE mg/dL
Ketones, POC UA: NEGATIVE mg/dL
Nitrite, UA: NEGATIVE
Protein Ur, POC: NEGATIVE mg/dL
Spec Grav, UA: 1.025 (ref 1.010–1.025)
Urobilinogen, UA: 0.2 E.U./dL
pH, UA: 6 (ref 5.0–8.0)

## 2020-08-13 MED ORDER — NITROFURANTOIN MONOHYD MACRO 100 MG PO CAPS: 100.0000 mg | ORAL_CAPSULE | Freq: Two times a day (BID) | ORAL | 0 refills | Status: AC

## 2020-08-13 NOTE — Patient Instructions (Signed)
° ° ° °  If you have lab work done today you will be contacted with your lab results within the next 2 weeks.  If you have not heard from us then please contact us. The fastest way to get your results is to register for My Chart. ° ° °IF you received an x-ray today, you will receive an invoice from [facility]. Please contact [facility] at [phone] with questions or concerns regarding your invoice.  ° °IF you received labwork today, you will receive an invoice from [facility]. Please contact [facility] at [phone] with questions or concerns regarding your invoice.  ° °Our billing staff will not be able to assist you with questions regarding bills from these companies. ° °You will be contacted with the lab results as soon as they are available. The fastest way to get your results is to activate your My Chart account. Instructions are located on the last page of this paperwork. If you have not heard from us regarding the results in 2 weeks, please contact this office. °  ° ° ° °

## 2020-08-14 LAB — THYROID PANEL WITH TSH
Free Thyroxine Index: 1.8 (ref 1.2–4.9)
T3 Uptake Ratio: 27 % (ref 24–39)
T4, Total: 6.8 ug/dL (ref 4.5–12.0)
TSH: 2.23 u[IU]/mL (ref 0.450–4.500)

## 2020-08-15 ENCOUNTER — Ambulatory Visit: Payer: BC Managed Care – PPO | Admitting: Physical Therapy

## 2020-08-15 LAB — ESTROGENS, TOTAL: Estrogen: 577 pg/mL

## 2020-08-17 ENCOUNTER — Ambulatory Visit: Payer: BC Managed Care – PPO | Admitting: Physical Therapy

## 2020-08-17 ENCOUNTER — Encounter: Payer: Self-pay | Admitting: Physical Therapy

## 2020-08-17 ENCOUNTER — Other Ambulatory Visit: Payer: Self-pay

## 2020-08-17 DIAGNOSIS — R262 Difficulty in walking, not elsewhere classified: Secondary | ICD-10-CM | POA: Diagnosis not present

## 2020-08-17 DIAGNOSIS — M6281 Muscle weakness (generalized): Secondary | ICD-10-CM

## 2020-08-17 LAB — URINE CULTURE

## 2020-08-17 NOTE — Therapy (Signed)
[place], [place], [address] Phone: [phone]   Fax:  [phone]  Physical Therapy Treatment  Patient Details  Name: Gwendolyn Nguyen MRN: [id number] Date of Birth: October 30, 1985 Referring Provider (PT): Amalia Hailey, MD   Encounter Date: 08/17/2020   PT End of Session - 08/17/20 0802    Visit Number 3    Number of Visits 30    Date for PT Re-Evaluation 10/25/20    Authorization Type BCBS    PT Start Time 0800    PT Stop Time 0841    PT Time Calculation (min) 41 min    Activity Tolerance Patient tolerated treatment well    Behavior During Therapy [facility] for tasks assessed/performed           Past Medical History:  Diagnosis Date  . Anemia   . Back pain   . Constipation   . Endometriosis   . IBS (irritable bowel syndrome)   . Low hemoglobin   . Multiple food allergies   . PID (acute pelvic inflammatory disease) 74/0814   complicated by tuboovarian abscess    Past Surgical History:  Procedure Laterality Date  . IR RADIOLOGIST EVAL & MGMT  05/19/2019  . IR RADIOLOGIST EVAL & MGMT  05/24/2019  . LAPAROSCOPIC LYSIS OF ADHESIONS N/A 07/24/2019   Procedure: Laparoscopic Lysis Of Adhesions, Drainage of Pelvic Abscess;  Surgeon: Servando Salina, MD;  Location: [place];  Service: Gynecology;  Laterality: N/A;  . LAPAROSCOPIC LYSIS OF ADHESIONS N/A 07/24/2019   Procedure: Laparoscopic Lysis Of Adhesions and Assist With Drainage of Pelvic Abscess;  Surgeon: Georganna Skeans, MD;  Location: [place];  Service: General;  Laterality: N/A;  . LAPAROSCOPY N/A 07/24/2019   Procedure: LAPAROSCOPY DIAGNOSTIC;  Surgeon: Servando Salina, MD;  Location: [place];  Service: Gynecology;  Laterality: N/A;  . TOTAL ABDOMINAL HYSTERECTOMY W/ BILATERAL SALPINGOOPHORECTOMY  04/13/2020   full ectocervix not removed  - NEEDS PAP (done at [facility] for endometriosis)    There were no vitals filed for this visit.   Subjective  Assessment - 08/17/20 0802    Subjective Feeling good so far this morning.    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[employer])              [facility] PT Assessment - 08/17/20 0001      6 Minute walk- Post Test   HR (bpm) 104    02 Sat (%RA) 88 %      6 minute walk test results    Aerobic Endurance Distance Walked 1347    Endurance additional comments no rest breaks, O2 dropped to 79% after a couple of minutes rest break                         [facility] Adult PT Treatment/Exercise - 08/17/20 0001      Knee/Hip Exercises: Stretches   Gastroc Stretch Both;2 reps;30 seconds    Gastroc Stretch Limitations slant board      Knee/Hip Exercises: Aerobic   Nustep 5 min L5 UE & Le    Other Aerobic 6MWT      Knee/Hip Exercises: Standing   Forward Lunges Limitations pull off of FM bar, alternating; x2 rounds to fatigue      Knee/Hip Exercises: Supine   Bridges with Ball Squeeze Both;15 reps   feet in DF     Knee/Hip Exercises: Sidelying  Hip ABduction Both;10 reps    Hip ABduction Limitations arcs    Clams x30 each      Shoulder Exercises: Seated   Flexion Weight (lbs) 1    Flexion Limitations to 90, tiny circles    ABduction Weight (lbs) 1    ABduction Limitations to 90, tiny circles    Other Seated Exercises goal post press OH, 1lb each hand, 2x15                    PT Short Term Goals - 07/25/20 1741      PT SHORT TERM GOAL #1   Title pt will be independent in HEP as it has been established in the short term    Baseline will progress as appropriate    Time 6    Period Weeks    Status New    Target Date 09/05/20      PT SHORT TERM GOAL #2   Title pt will demo proper core engagement in supine, seated and standing    Baseline unable properly at eval    Time 6    Period Weeks    Status New    Target Date 09/05/20      PT SHORT TERM GOAL #3   Title pt will be able to demo good  form to squat to grab small objects from floor    Baseline compensates at eval due to difficulty/pain    Time 6    Period Weeks    Status New    Target Date 09/05/20             PT Long Term Goals - 07/25/20 1749      PT LONG TERM GOAL #1   Title pt will be able to lift case of water from [place] unable at eval    Time 3    Period Months    Status New    Target Date 10/25/20      PT LONG TERM GOAL #2   Title 6MWT at least 1300 ft    Baseline 1035 at eval    Time 3    Period Months    Status New    Target Date 10/25/20      PT LONG TERM GOAL #3   Title 5TSTS 12s or less    Baseline 26s at eval    Time 3    Period Months    Status New    Target Date 10/25/20      PT LONG TERM GOAL #4   Title pt will use stairs at work    Baseline unable and uses elevator due to fatigue    Time 3    Period Months    Status New    Target Date 10/25/20      PT LONG TERM GOAL #5   Title pt will be independent in safe, challenging core strength program    Baseline none established at this time    Time 3    Period Months    Status New    Target Date 10/25/20                 Plan - 08/17/20 1120    Clinical Impression Statement Significant improvement in 6MWT distance today. Added table exercises to HEP for options to perform when she is tired at the end of a day. O2 dropped to 79% a few min after completing test but denied symptoms assosicated. Will cont  to monitor vitals in exercises.    PT Treatment/Interventions ADLs/Self Care Home Management;Cryotherapy;Electrical Stimulation;Gait training;Moist Heat;Stair training;Functional mobility training;Therapeutic activities;Therapeutic exercise;Aquatic Therapy;Neuromuscular re-education;Manual techniques;Patient/family education;Passive range of motion;Dry needling;Taping    PT Next Visit Plan cont to add UE- quadruped exercises    PT Home Exercise Plan continue with light upper body weights, walking, [id number]     Consulted and Agree with Plan of Care Patient           Patient will benefit from skilled therapeutic intervention in order to improve the following deficits and impairments:  Difficulty walking, Increased muscle spasms, Impaired UE functional use, Decreased endurance, Cardiopulmonary status limiting activity, Decreased activity tolerance, Pain, Decreased strength  Visit Diagnosis: Difficulty in walking, not elsewhere classified  Muscle weakness (generalized)     Problem List Patient Active Problem List   Diagnosis Date Noted  . Abnormal findings on flexible sigmoidoscopy 02/22/2020  . Abnormal endoscopy of upper gastrointestinal tract 02/22/2020  . History of small bowel obstruction 02/22/2020  . Gastritis without bleeding 02/22/2020  . Endometriosis 02/22/2020  . Rectal bleeding 02/22/2020  . Chronic pelvic pain in female 02/22/2020  . Chronic generalized abdominal pain 02/22/2020  . TOA (tubo-ovarian abscess) 07/24/2019  . Tubo-ovarian abscess 07/20/2019  . Abdominal pain 07/20/2019  . Physical deconditioning 05/14/2019  . PID (pelvic inflammatory disease) 05/14/2019  . Small bowel obstruction ([place])   . Pelvic mass 05/02/2019  . Elevated glucose 11/29/2018  . Class 1 obesity with serious comorbidity and body mass index (BMI) of 30.0 to 30.9 in adult 11/29/2018    Mallary Kreger C. Merida Alcantar PT, DPT 08/17/20 11:23 AM   [place] [facility] [address] [place], [place], [address] Phone: [phone]   Fax:  [phone]  Name: Mairead Schwarzkopf MRN: [id number] Date of Birth: 1986-07-16

## 2020-08-19 ENCOUNTER — Encounter: Payer: Self-pay | Admitting: Registered Nurse

## 2020-08-21 ENCOUNTER — Other Ambulatory Visit: Payer: Self-pay

## 2020-08-21 ENCOUNTER — Ambulatory Visit: Payer: BC Managed Care – PPO | Attending: Obstetrics and Gynecology | Admitting: Physical Therapy

## 2020-08-21 ENCOUNTER — Encounter: Payer: Self-pay | Admitting: Physical Therapy

## 2020-08-21 DIAGNOSIS — M6281 Muscle weakness (generalized): Secondary | ICD-10-CM | POA: Insufficient documentation

## 2020-08-21 DIAGNOSIS — R262 Difficulty in walking, not elsewhere classified: Secondary | ICD-10-CM | POA: Insufficient documentation

## 2020-08-21 NOTE — Therapy (Signed)
[place], [place], [address] Phone: [phone]   Fax:  [phone]  Physical Therapy Treatment  Patient Details  Name: Gwendolyn Nguyen MRN: [id number] Date of Birth: May 05, 1986 Referring Provider (PT): Amalia Hailey, MD   Encounter Date: 08/21/2020   PT End of Session - 08/21/20 1638    Visit Number 4    Number of Visits 30    Date for PT Re-Evaluation 10/25/20    Authorization Type BCBS    PT Start Time 1634    PT Stop Time 9702    PT Time Calculation (min) 41 min    Activity Tolerance Patient tolerated treatment well    Behavior During Therapy [facility] for tasks assessed/performed           Past Medical History:  Diagnosis Date  . Anemia   . Back pain   . Constipation   . Endometriosis   . IBS (irritable bowel syndrome)   . Low hemoglobin   . Multiple food allergies   . PID (acute pelvic inflammatory disease) 63/7858   complicated by tuboovarian abscess    Past Surgical History:  Procedure Laterality Date  . IR RADIOLOGIST EVAL & MGMT  05/19/2019  . IR RADIOLOGIST EVAL & MGMT  05/24/2019  . LAPAROSCOPIC LYSIS OF ADHESIONS N/A 07/24/2019   Procedure: Laparoscopic Lysis Of Adhesions, Drainage of Pelvic Abscess;  Surgeon: Servando Salina, MD;  Location: [place];  Service: Gynecology;  Laterality: N/A;  . LAPAROSCOPIC LYSIS OF ADHESIONS N/A 07/24/2019   Procedure: Laparoscopic Lysis Of Adhesions and Assist With Drainage of Pelvic Abscess;  Surgeon: Georganna Skeans, MD;  Location: [place];  Service: General;  Laterality: N/A;  . LAPAROSCOPY N/A 07/24/2019   Procedure: LAPAROSCOPY DIAGNOSTIC;  Surgeon: Servando Salina, MD;  Location: [place];  Service: Gynecology;  Laterality: N/A;  . TOTAL ABDOMINAL HYSTERECTOMY W/ BILATERAL SALPINGOOPHORECTOMY  04/13/2020   full ectocervix not removed  - NEEDS PAP (done at [facility] for endometriosis)    There were no vitals filed for this visit.   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 I did find out I have an infection and have a follow up scheduled tomorrow.    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 No/denies                             [facility] Adult PT Treatment/Exercise - 08/21/20 0001      Knee/Hip Exercises: Aerobic   Stationary Bike 5 min L3      Shoulder Exercises: Supine   Horizontal ABduction 15 reps    Theraband Level (Shoulder Horizontal ABduction) Level 2 (Red)    Diagonals Both;15 reps    Theraband Level (Shoulder Diagonals) Level 2 (Red)      Shoulder Exercises: Seated   Other Seated Exercises seated biceps curls red tband    Other Seated Exercises goal post press OH red tband      Shoulder Exercises: Standing   External Rotation Both;20 reps    Internal Rotation Both;20 reps    Theraband Level (Shoulder Internal Rotation) Level 2 (Red)    Extension Both;Strengthening    Theraband Level (Shoulder Extension) Level 3 (Green)    Row  Both;20 reps    Theraband Level (Shoulder Row) Level 3 (Green)      Shoulder Exercises: Stretch   Other Shoulder Stretches open book                    PT Short Term Goals - 07/25/20 1741      PT SHORT TERM GOAL #1   Title pt will be independent in HEP as it has been established in the short term    Baseline will progress as appropriate    Time 6    Period Weeks    Status New    Target Date 09/05/20      PT SHORT TERM GOAL #2   Title pt will demo proper core engagement in supine, seated and standing    Baseline unable properly at eval    Time 6    Period Weeks    Status New    Target Date 09/05/20      PT SHORT TERM GOAL #3   Title pt will be able to demo good form to squat to grab small objects from floor    Baseline compensates at eval due to  difficulty/pain    Time 6    Period Weeks    Status New    Target Date 09/05/20             PT Long Term Goals - 07/25/20 1749      PT LONG TERM GOAL #1   Title pt will be able to lift case of water from [place] unable at eval    Time 3    Period Months    Status New    Target Date 10/25/20      PT LONG TERM GOAL #2   Title 6MWT at least 1300 ft    Baseline 1035 at eval    Time 3    Period Months    Status New    Target Date 10/25/20      PT LONG TERM GOAL #3   Title 5TSTS 12s or less    Baseline 26s at eval    Time 3    Period Months    Status New    Target Date 10/25/20      PT LONG TERM GOAL #4   Title pt will use stairs at work    Baseline unable and uses elevator due to fatigue    Time 3    Period Months    Status New    Target Date 10/25/20      PT LONG TERM GOAL #5   Title pt will be independent in safe, challenging core strength program    Baseline none established at this time    Time 3    Period Months    Status New    Target Date 10/25/20                 Plan - 08/21/20 1724    Clinical Impression Statement focused on UE strengthening/periscap control today & avoided much abdominal activation with known infection. Was exhausted after last appointment that was in the AM indicating functional endurance is still lacking. Beginning aquatic PT tomorrow.    PT Treatment/Interventions ADLs/Self Care Home Management;Cryotherapy;Electrical Stimulation;Gait training;Moist Heat;Stair training;Functional mobility training;Therapeutic activities;Therapeutic exercise;Aquatic Therapy;Neuromuscular re-education;Manual techniques;Patient/family education;Passive range of motion;Dry needling;Taping    PT Next Visit Plan how did aquatics go? infection clear? consider reformer    PT Home Exercise Plan continue with light  upper body weights, walking, [id number]    Consulted and Agree with Plan of Care Patient           Patient will benefit from  skilled therapeutic intervention in order to improve the following deficits and impairments:  Difficulty walking, Increased muscle spasms, Impaired UE functional use, Decreased endurance, Cardiopulmonary status limiting activity, Decreased activity tolerance, Pain, Decreased strength  Visit Diagnosis: Difficulty in walking, not elsewhere classified  Muscle weakness (generalized)     Problem List Patient Active Problem List   Diagnosis Date Noted  . Abnormal findings on flexible sigmoidoscopy 02/22/2020  . Abnormal endoscopy of upper gastrointestinal tract 02/22/2020  . History of small bowel obstruction 02/22/2020  . Gastritis without bleeding 02/22/2020  . Endometriosis 02/22/2020  . Rectal bleeding 02/22/2020  . Chronic pelvic pain in female 02/22/2020  . Chronic generalized abdominal pain 02/22/2020  . TOA (tubo-ovarian abscess) 07/24/2019  . Tubo-ovarian abscess 07/20/2019  . Abdominal pain 07/20/2019  . Physical deconditioning 05/14/2019  . PID (pelvic inflammatory disease) 05/14/2019  . Small bowel obstruction ([place])   . Pelvic mass 05/02/2019  . Elevated glucose 11/29/2018  . Class 1 obesity with serious comorbidity and body mass index (BMI) of 30.0 to 30.9 in adult 11/29/2018   Xzavion Doswell C. Rye Decoste PT, DPT 08/21/20 5:27 PM   [facility] [address] [place], [place], [address] Phone: [phone]   Fax:  [phone]  Name: Gwendolyn Nguyen MRN: [id number] Date of Birth: [birth year]

## 2020-08-22 ENCOUNTER — Encounter: Payer: Self-pay | Admitting: Physical Therapy

## 2020-08-22 ENCOUNTER — Ambulatory Visit: Payer: BC Managed Care – PPO | Admitting: Physical Therapy

## 2020-08-22 DIAGNOSIS — R262 Difficulty in walking, not elsewhere classified: Secondary | ICD-10-CM | POA: Diagnosis not present

## 2020-08-22 DIAGNOSIS — M6281 Muscle weakness (generalized): Secondary | ICD-10-CM

## 2020-08-22 NOTE — Therapy (Signed)
[place], [place], [address] Phone: [phone]   Fax:  [phone]  Physical Therapy Treatment  Patient Details  Name: Gwendolyn Nguyen MRN: [id number] Date of Birth: 02-13-1986 Referring Provider (PT): Amalia Hailey, MD   Encounter Date: 08/22/2020   PT End of Session - 08/22/20 1711    Visit Number 5    Number of Visits 30    Date for PT Re-Evaluation 10/25/20    Authorization Type BCBS    PT Start Time 1420    PT Stop Time 1500    PT Time Calculation (min) 40 min    Activity Tolerance Patient tolerated treatment well    Behavior During Therapy [facility] for tasks assessed/performed           Past Medical History:  Diagnosis Date  . Anemia   . Back pain   . Constipation   . Endometriosis   . IBS (irritable bowel syndrome)   . Low hemoglobin   . Multiple food allergies   . PID (acute pelvic inflammatory disease) 69/6295   complicated by tuboovarian abscess    Past Surgical History:  Procedure Laterality Date  . IR RADIOLOGIST EVAL & MGMT  05/19/2019  . IR RADIOLOGIST EVAL & MGMT  05/24/2019  . LAPAROSCOPIC LYSIS OF ADHESIONS N/A 07/24/2019   Procedure: Laparoscopic Lysis Of Adhesions, Drainage of Pelvic Abscess;  Surgeon: Servando Salina, MD;  Location: [place];  Service: Gynecology;  Laterality: N/A;  . LAPAROSCOPIC LYSIS OF ADHESIONS N/A 07/24/2019   Procedure: Laparoscopic Lysis Of Adhesions and Assist With Drainage of Pelvic Abscess;  Surgeon: Georganna Skeans, MD;  Location: [place];  Service: General;  Laterality: N/A;  . LAPAROSCOPY N/A 07/24/2019   Procedure: LAPAROSCOPY DIAGNOSTIC;  Surgeon: Servando Salina, MD;  Location: [place];  Service: Gynecology;  Laterality: N/A;  . TOTAL ABDOMINAL HYSTERECTOMY W/ BILATERAL SALPINGOOPHORECTOMY  04/13/2020   full ectocervix not removed  - NEEDS PAP (done at [facility] for endometriosis)    There were no vitals filed for this visit.   Subjective Assessment  - 08/22/20 1707    Subjective I have done swimming before.  I am very deconditioned and I hurt after I engage my abdominals.. so far I don't feel any pain in the water.    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 No/denies    Pain Location --   general muscle pain/fatigue             Aquatic therapy at [facility] - pool temp 86.0 degrees  Pt enters [facility] with no AD. Patient seen for aquatic therapy today.  Treatment took place in water 3.5-4 feet deep depending upon activity.  Pt entered and exited the pool via ramp negotiation with use of one UE on rail . PT present for supervision in pool but pt independent with movements   Ai Chi introduced for deep breathing and pain control using slow controlled Tai chi like movements with increasing ER/IR of hip and varying angles over knees and ankle to prepare for unlevel ambulation on ground.  Ai  posture of soothing, enclosing and gathering x 15 reps each.  Ai Chi postures - contemplating, floating, uplifting, enclosing folding and soothing postures including balancing 10 reps each without support - cues given to keep core tight for improved trunk stabilization;   Pt performed runner's stretch for stretching bil  hip hamstrings  30 sec hold x 1 rep each; gastroc stretch bil LE 30 sec hold x 1 rep  Pt performed AROM/strengthening exercises bil hip - standing hip flexion with knee extended 15 reps, with knee flexed 10 reps, hip extension, hip abduction  With knee extended 15 reps with use of aquatic ankle cuff for resistance with the eccentric exercise -  abduction/adduction with ankle cuff on bil LE  - knee flexed at 90 degrees 15 reps; Bil hip flexion/extension with knee flexed with use of ankle cuff for increased resistance for strengthening - 15 reps  Pt performed water walking forwards 72mx 2 reps utilizing pool noodle submerged for abdominal engagement  , and  continued 25 m side stepping to the right and to the Left each.  Pt was able to acclimate to forward water walking to complete 25 m x 2 using flotation barbells  Submerged for resistance and increasing abdominal engagement.  Modified leg press - pt supported by PTA - feet on wall - pushed into hip and knee flexion for closed chain strengthening 10 reps Pt performed supine exercises - pool noodle used for flotation  - bicycling LE's with pt  Engaging abdominals /no pain; hip abduction/adduction 2 sets 10 reps    Bag Radaz, Pt with lumbar belt around hips and nek doodle for neck support. .  PT initially  at torso and assisting with trunk engagment left to right and vice versa to engage trunk muscles.  Emphasis on breathing techniques to draw in abdominals for support.  PT adding rotational component to side to side to engage ext and in obliques. Pt then engages posterior chain with hip ext , knee flexion and hip ext/knee flex against water resisitance.     Pt then gently comes to standing by holding onto pool ledge and takes foot forward step and being aware of multifidus engagement . Bag Radaz uses trunk movements in water to elongate and relax and then engage trunk muscles for improving proprioception and neuromuscular functioning  Pt requires the buoyancy of water for active  exercises with buoyancy supported for strengthening & ROM exercises: pt requires the viscosity of the water for resistance with strengthening exercises for general deconditioning due to hospitalization Water will provide assistance with movement using the current and laminar flow while the buoyancy reduces weight bearing Water current provides perturbations which challenge standing balance unsupported and decreases perception of pain due to hydrostatic pressure allowing for pain free LE movement with abdominal engagement                        PT Education - 08/22/20 1710    Education Details introduction to  Aquatics and properties and benefits of water    Person(s) Educated Patient    Methods Explanation;Demonstration;Verbal cues;Tactile cues    Comprehension Verbalized understanding;Returned demonstration            PT Short Term Goals - 07/25/20 1741      PT SHORT TERM GOAL #1   Title pt will be independent in HEP as it has been established in the short term    Baseline will progress as appropriate    Time 6    Period Weeks    Status New    Target Date 09/05/20      PT SHORT TERM GOAL #2   Title pt will demo proper core engagement in supine, seated and standing    Baseline unable properly at eval    Time 6  Period Weeks    Status New    Target Date 09/05/20      PT SHORT TERM GOAL #3   Title pt will be able to demo good form to squat to grab small objects from floor    Baseline compensates at eval due to difficulty/pain    Time 6    Period Weeks    Status New    Target Date 09/05/20             PT Long Term Goals - 07/25/20 1749      PT LONG TERM GOAL #1   Title pt will be able to lift case of water from [place] unable at eval    Time 3    Period Months    Status New    Target Date 10/25/20      PT LONG TERM GOAL #2   Title 6MWT at least 1300 ft    Baseline 1035 at eval    Time 3    Period Months    Status New    Target Date 10/25/20      PT LONG TERM GOAL #3   Title 5TSTS 12s or less    Baseline 26s at eval    Time 3    Period Months    Status New    Target Date 10/25/20      PT LONG TERM GOAL #4   Title pt will use stairs at work    Baseline unable and uses elevator due to fatigue    Time 3    Period Months    Status New    Target Date 10/25/20      PT LONG TERM GOAL #5   Title pt will be independent in safe, challenging core strength program    Baseline none established at this time    Time 3    Period Months    Status New    Target Date 10/25/20                 Plan - 08/22/20 1711    Clinical Impression  Statement Ms Cieslewicz introduced to aquatic concepts and therapeutic benefits of water.  Ms Lim is generally deconditioned and water buoyance supports weakened muscles especially of abdomin after several abdominal suregeries.  Pt was able to engage abdominals even more by submerging aquatic cuffs/ noodles  Pt was able to participate fully/continueally for 40 minutes of exercise in water without exacerbation of pain    Personal Factors and Comorbidities Time since onset of injury/illness/exacerbation;Comorbidity 3+    Comorbidities anemia, low hemoglobin, h/o multiple surgeries & abdominal infection    Examination-Activity Limitations Reach Overhead;Bend;Sit;Sleep;Carry;Squat;Stairs;Stand;Lift;Locomotion Level    Examination-Participation Restrictions Occupation;Cleaning;Shop;Laundry;Meal Prep    PT Frequency 2x / week    PT Duration --   3 months   PT Treatment/Interventions ADLs/Self Care Home Management;Cryotherapy;Electrical Stimulation;Gait training;Moist Heat;Stair training;Functional mobility training;Therapeutic activities;Therapeutic exercise;Aquatic Therapy;Neuromuscular re-education;Manual techniques;Patient/family education;Passive range of motion;Dry needling;Taping    PT Next Visit Plan how did aquatics go? infection clear? consider reformer    PT Home Exercise Plan continue with light upper body weights, walking, [id number]    Consulted and Agree with Plan of Care Patient           Patient will benefit from skilled therapeutic intervention in order to improve the following deficits and impairments:  Difficulty walking, Increased muscle spasms, Impaired UE functional use, Decreased endurance, Cardiopulmonary status limiting activity, Decreased activity tolerance, Pain, Decreased strength  Visit Diagnosis: Difficulty in walking, not elsewhere classified  Muscle weakness (generalized)     Problem List Patient Active Problem List   Diagnosis Date Noted  . Abnormal findings on  flexible sigmoidoscopy 02/22/2020  . Abnormal endoscopy of upper gastrointestinal tract 02/22/2020  . History of small bowel obstruction 02/22/2020  . Gastritis without bleeding 02/22/2020  . Endometriosis 02/22/2020  . Rectal bleeding 02/22/2020  . Chronic pelvic pain in female 02/22/2020  . Chronic generalized abdominal pain 02/22/2020  . TOA (tubo-ovarian abscess) 07/24/2019  . Tubo-ovarian abscess 07/20/2019  . Abdominal pain 07/20/2019  . Physical deconditioning 05/14/2019  . PID (pelvic inflammatory disease) 05/14/2019  . Small bowel obstruction ([place])   . Pelvic mass 05/02/2019  . Elevated glucose 11/29/2018  . Class 1 obesity with serious comorbidity and body mass index (BMI) of 30.0 to 30.9 in adult 11/29/2018    Voncille Lo, PT, [facility] Certified Exercise Expert for the Aging Adult  08/22/20 7:14 PM Phone: [phone] Fax: [place] [facility] [address] [place], [place], [address] Phone: [phone]   Fax:  [phone]  Name: Purvi Ruehl MRN: [id number] Date of Birth: 07-21-1986

## 2020-08-28 ENCOUNTER — Encounter: Payer: Self-pay | Admitting: Physical Therapy

## 2020-08-28 ENCOUNTER — Ambulatory Visit: Payer: BC Managed Care – PPO | Admitting: Physical Therapy

## 2020-08-28 ENCOUNTER — Other Ambulatory Visit: Payer: Self-pay

## 2020-08-28 DIAGNOSIS — R262 Difficulty in walking, not elsewhere classified: Secondary | ICD-10-CM

## 2020-08-28 DIAGNOSIS — M6281 Muscle weakness (generalized): Secondary | ICD-10-CM

## 2020-08-28 NOTE — Therapy (Signed)
[place], [place], [address] Phone: [phone]   Fax:  [phone]  Physical Therapy Treatment  Patient Details  Name: Gwendolyn Nguyen MRN: [id number] Date of Birth: 08/09/1986 Referring Provider (PT): Amalia Hailey, MD   Encounter Date: 08/28/2020   PT End of Session - 08/28/20 1838    Visit Number 6    Number of Visits 30    Date for PT Re-Evaluation 10/25/20    Authorization Type BCBS    PT Start Time 1835    PT Stop Time 1915    PT Time Calculation (min) 40 min    Activity Tolerance Patient tolerated treatment well    Behavior During Therapy [facility] for tasks assessed/performed           Past Medical History:  Diagnosis Date  . Anemia   . Back pain   . Constipation   . Endometriosis   . IBS (irritable bowel syndrome)   . Low hemoglobin   . Multiple food allergies   . PID (acute pelvic inflammatory disease) 96/7591   complicated by tuboovarian abscess    Past Surgical History:  Procedure Laterality Date  . IR RADIOLOGIST EVAL & MGMT  05/19/2019  . IR RADIOLOGIST EVAL & MGMT  05/24/2019  . LAPAROSCOPIC LYSIS OF ADHESIONS N/A 07/24/2019   Procedure: Laparoscopic Lysis Of Adhesions, Drainage of Pelvic Abscess;  Surgeon: Servando Salina, MD;  Location: [place];  Service: Gynecology;  Laterality: N/A;  . LAPAROSCOPIC LYSIS OF ADHESIONS N/A 07/24/2019   Procedure: Laparoscopic Lysis Of Adhesions and Assist With Drainage of Pelvic Abscess;  Surgeon: Georganna Skeans, MD;  Location: [place];  Service: General;  Laterality: N/A;  . LAPAROSCOPY N/A 07/24/2019   Procedure: LAPAROSCOPY DIAGNOSTIC;  Surgeon: Servando Salina, MD;  Location: [place];  Service: Gynecology;  Laterality: N/A;  . TOTAL ABDOMINAL HYSTERECTOMY W/ BILATERAL SALPINGOOPHORECTOMY  04/13/2020   full ectocervix not removed  - NEEDS PAP (done at [facility] for endometriosis)    There were no vitals filed for this visit.   Subjective Assessment  - 08/28/20 1840    Subjective Aquatic therapy was harder than I thought it was going to be! I cannot decide if I have abdominal pain from surgery/doing something or from using core muscles. It didn't start until 2 days after aquatics. Overall I felt good after the treatment though.    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 Yes    Pain Location Abdomen    Pain Orientation Left    Pain Descriptors / Indicators Aching    Aggravating Factors  unsure    Pain Relieving Factors unsure              [facility] PT Assessment - 08/28/20 0001      Special Tests   Other special tests 5TSTS 14s                         [facility] Adult PT Treatment/Exercise - 08/28/20 0001      Knee/Hip Exercises: Aerobic   Nustep 5 min L7 UE & LE      Knee/Hip Exercises: Standing   Other Standing Knee Exercises wall sit 3*20s      Knee/Hip Exercises: Supine   Bridges Strengthening;Both;20 reps      Knee/Hip Exercises: Sidelying   Hip ABduction Both;15 reps    Hip  ABduction Limitations arcs      Knee/Hip Exercises: Prone   Other Prone Exercises qped hip ext with toe slides, hip abd + ER    Other Prone Exercises kneeling squats 6lb at chest      Manual Therapy   Manual therapy comments edu in scar tissue massage in abdominal region                    PT Short Term Goals - 07/25/20 1741      PT SHORT TERM GOAL #1   Title pt will be independent in HEP as it has been established in the short term    Baseline will progress as appropriate    Time 6    Period Weeks    Status New    Target Date 09/05/20      PT SHORT TERM GOAL #2   Title pt will demo proper core engagement in supine, seated and standing    Baseline unable properly at eval    Time 6    Period Weeks    Status New    Target Date 09/05/20      PT SHORT TERM GOAL #3   Title pt will be able to demo good form to squat to  grab small objects from floor    Baseline compensates at eval due to difficulty/pain    Time 6    Period Weeks    Status New    Target Date 09/05/20             PT Long Term Goals - 07/25/20 1749      PT LONG TERM GOAL #1   Title pt will be able to lift case of water from [place] unable at eval    Time 3    Period Months    Status New    Target Date 10/25/20      PT LONG TERM GOAL #2   Title 6MWT at least 1300 ft    Baseline 1035 at eval    Time 3    Period Months    Status New    Target Date 10/25/20      PT LONG TERM GOAL #3   Title 5TSTS 12s or less    Baseline 26s at eval    Time 3    Period Months    Status New    Target Date 10/25/20      PT LONG TERM GOAL #4   Title pt will use stairs at work    Baseline unable and uses elevator due to fatigue    Time 3    Period Months    Status New    Target Date 10/25/20      PT LONG TERM GOAL #5   Title pt will be independent in safe, challenging core strength program    Baseline none established at this time    Time 3    Period Months    Status New    Target Date 10/25/20                 Plan - 08/28/20 1850    Clinical Impression Statement Abdominal pain consistent with scar tissue limitation in mobility of skin on muscle layer of abdominals. She has a cross-shape incision that has limited motion when gliding skin. will work on scar tissue mobilization at home. Requested focus on LE strength today as she is working on increasing her walking endurance around her neighborhood.  5TSTS decreased significantly today from 26 s to 14s.    PT Treatment/Interventions ADLs/Self Care Home Management;Cryotherapy;Electrical Stimulation;Gait training;Moist Heat;Stair training;Functional mobility training;Therapeutic activities;Therapeutic exercise;Aquatic Therapy;Neuromuscular re-education;Manual techniques;Patient/family education;Passive range of motion;Dry needling;Taping    PT Home Exercise Plan continue  with light upper body weights, walking, [id number]    Consulted and Agree with Plan of Care Patient           Patient will benefit from skilled therapeutic intervention in order to improve the following deficits and impairments:  Difficulty walking, Increased muscle spasms, Impaired UE functional use, Decreased endurance, Cardiopulmonary status limiting activity, Decreased activity tolerance, Pain, Decreased strength  Visit Diagnosis: Difficulty in walking, not elsewhere classified  Muscle weakness (generalized)     Problem List Patient Active Problem List   Diagnosis Date Noted  . Abnormal findings on flexible sigmoidoscopy 02/22/2020  . Abnormal endoscopy of upper gastrointestinal tract 02/22/2020  . History of small bowel obstruction 02/22/2020  . Gastritis without bleeding 02/22/2020  . Endometriosis 02/22/2020  . Rectal bleeding 02/22/2020  . Chronic pelvic pain in female 02/22/2020  . Chronic generalized abdominal pain 02/22/2020  . TOA (tubo-ovarian abscess) 07/24/2019  . Tubo-ovarian abscess 07/20/2019  . Abdominal pain 07/20/2019  . Physical deconditioning 05/14/2019  . PID (pelvic inflammatory disease) 05/14/2019  . Small bowel obstruction ([place])   . Pelvic mass 05/02/2019  . Elevated glucose 11/29/2018  . Class 1 obesity with serious comorbidity and body mass index (BMI) of 30.0 to 30.9 in adult 11/29/2018    Asia Favata C. Kacin Dancy PT, DPT 08/28/20 7:15 PM   [place], [place], [address] Phone: [phone]   Fax:  [phone]  Name: Gwendolyn Nguyen MRN: [id number] Date of Birth: 1986/03/22

## 2020-08-29 ENCOUNTER — Other Ambulatory Visit: Payer: Self-pay

## 2020-08-29 ENCOUNTER — Ambulatory Visit: Payer: BC Managed Care – PPO | Admitting: Physical Therapy

## 2020-08-29 ENCOUNTER — Encounter: Payer: Self-pay | Admitting: Physical Therapy

## 2020-08-29 DIAGNOSIS — R262 Difficulty in walking, not elsewhere classified: Secondary | ICD-10-CM | POA: Diagnosis not present

## 2020-08-29 DIAGNOSIS — M6281 Muscle weakness (generalized): Secondary | ICD-10-CM

## 2020-08-29 NOTE — Therapy (Signed)
[place], [place], [address] Phone: [phone]   Fax:  [phone]  Physical Therapy Treatment  Patient Details  Name: Gwendolyn Nguyen MRN: [id number] Date of Birth: 25-Aug-1986 Referring Provider (PT): Amalia Hailey, MD   Encounter Date: 08/29/2020   PT End of Session - 08/29/20 1829    Visit Number 7    Number of Visits 30    Date for PT Re-Evaluation 10/25/20    Authorization Type BCBS    PT Start Time 1545    PT Stop Time 1632    PT Time Calculation (min) 47 min    Activity Tolerance Patient tolerated treatment well    Behavior During Therapy [facility] for tasks assessed/performed           Past Medical History:  Diagnosis Date   Anemia    Back pain    Constipation    Endometriosis    IBS (irritable bowel syndrome)    Low hemoglobin    Multiple food allergies    PID (acute pelvic inflammatory disease) 63/8466   complicated by tuboovarian abscess    Past Surgical History:  Procedure Laterality Date   IR RADIOLOGIST EVAL & MGMT  05/19/2019   IR RADIOLOGIST EVAL & MGMT  05/24/2019   LAPAROSCOPIC LYSIS OF ADHESIONS N/A 07/24/2019   Procedure: Laparoscopic Lysis Of Adhesions, Drainage of Pelvic Abscess;  Surgeon: Servando Salina, MD;  Location: [place];  Service: Gynecology;  Laterality: N/A;   LAPAROSCOPIC LYSIS OF ADHESIONS N/A 07/24/2019   Procedure: Laparoscopic Lysis Of Adhesions and Assist With Drainage of Pelvic Abscess;  Surgeon: Georganna Skeans, MD;  Location: [place];  Service: General;  Laterality: N/A;   LAPAROSCOPY N/A 07/24/2019   Procedure: LAPAROSCOPY DIAGNOSTIC;  Surgeon: Servando Salina, MD;  Location: [place];  Service: Gynecology;  Laterality: N/A;   TOTAL ABDOMINAL HYSTERECTOMY W/ BILATERAL SALPINGOOPHORECTOMY  04/13/2020   full ectocervix not removed  - NEEDS PAP (done at [facility] for endometriosis)    There were no vitals filed for this visit.   Subjective  Assessment - 08/29/20 1827    Subjective I am feeling stressed from dealing with people all day.  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 Yes    Pain Score 6     Pain Location Abdomen    Pain Orientation Left    Pain Descriptors / Indicators Aching             Aquatic therapy at [facility] - pool temp 85.5 degrees Patient seen for aquatic therapy today. Pt enters with no AD  Treatment took place in water 3.5-4 feet deep depending upon activity.  Pt entered and exited the pool via ramp negotiation with use of bil handrails and supervision in the water.  Pt is generally deconditioned due to multiple abdominal surgeries and extensive hospital stays.     On edge of pool with bil UE support  Pt performed LE exercise  Hip abd/add R/L 20 x each and then using 1 UE support Hip ext/flex with knee straight x 20,  Marching knee/hip 90/90 x 20 and then ham curl R/L x 20   Pt performed water walking forwards, backwards and sideways with squat x33m x 2 reps of each;  Had pt place UE's under water for increased abdominal engagement   for strengthening.  Sideways  stepping with squats 72m x 2 reps with UE bar bells for resistance.  Forward marching with opposite arm swing with UE bar bells x 63m forward then 54m backward.  Stopping at times to maintain proper sequence Pt performed jogging 14m x 4 reps across pool with cues for reciprocal arm movement.  Brief rest break between each rep.  Later in session performed jogging x 39m x 1 rep     Squats x 10 reps with intermittent UE support x 2 sets. Lunge to Target 10 x on LT and 10 x on RT Bicycle exercise with UE against pool ledge and back to wall for abdominal and LE engagement.  Squats x 20, lunge to target 15 x 1 on Rt and 15 x 1 on LT  Utilizing aquatic bar bells. Pt performs prone flutter kicks for 20 m x 2. Attempted mermaid  swimming to engage abdominals but pt more efficient using flutter kicks so did additional [place], Pt with lumbar belt around hips and nek doodle for neck support. . PT initiallyat torso and assisting with trunk engagmentleft to right and vice versa to engage trunk muscles. Emphasis on breathing techniques to draw in abdominals for support.  PT adding rotational component to side to side to engage ext and in obliques. Pool noodle added under arms to increase drag and challenge abdominal intrinsics and extrinsics with rotation. Pt then engages posterior chain with hip ext , knee flexion and hip ext/knee flex against water resisitance.   Pt then gently comes to standing by holding onto pool ledge and takes foot forward step and being aware of multifidus engagement . Bag Radaz uses trunk movements in water to elongate and relax and then engage trunk muscles for improving proprioception and neuromuscular functioning  Pt requires the buoyancy of water for active assisted exercises with buoyancy supported for strengthening & ROM exercises: pt requires the viscosity of the water for resistance with strengthening exercises Water current provides perturbations which challenge standing balance unsupported as well as postural control muscles/core                PT Education - 08/29/20 [place]    Education Details Reinforcing aquatics and principles of water benefit, working HEP for home use with demo in [employer]) Educated Patient    Methods Explanation;Demonstration;Verbal cues    Comprehension Verbalized understanding;Returned demonstration            PT Short Term Goals - 07/25/20 1741      PT SHORT TERM GOAL #1   Title pt will be independent in HEP as it has been established in the short term    Baseline will progress as appropriate    Time 6    Period Weeks    Status New    Target Date 09/05/20      PT SHORT TERM GOAL #2   Title pt will demo proper core  engagement in supine, seated and standing    Baseline unable properly at eval    Time 6    Period Weeks    Status New    Target Date 09/05/20      PT SHORT TERM GOAL #3   Title pt will be able to demo good form to squat to grab small objects from floor    Baseline compensates at eval due to difficulty/pain    Time 6    Period Weeks    Status New    Target  Date 09/05/20             PT Long Term Goals - 07/25/20 1749      PT LONG TERM GOAL #1   Title pt will be able to lift case of water from [place] unable at eval    Time 3    Period Months    Status New    Target Date 10/25/20      PT LONG TERM GOAL #2   Title 6MWT at least 1300 ft    Baseline 1035 at eval    Time 3    Period Months    Status New    Target Date 10/25/20      PT LONG TERM GOAL #3   Title 5TSTS 12s or less    Baseline 26s at eval    Time 3    Period Months    Status New    Target Date 10/25/20      PT LONG TERM GOAL #4   Title pt will use stairs at work    Baseline unable and uses elevator due to fatigue    Time 3    Period Months    Status New    Target Date 10/25/20      PT LONG TERM GOAL #5   Title pt will be independent in safe, challenging core strength program    Baseline none established at this time    Time 3    Period Months    Status New    Target Date 10/25/20                 Plan - 08/29/20 1830    Clinical Impression Statement Ms Brigandi enters pool enthusiastically.  Pt is clearly now comfortable in the water and is able to perform more challenging jogging,  and flutter kicks while swimming across pool.  Abdominal pain is reduced due to the hydrostatic pressue around torso making movements more comfortable. Pt was able to participate the entire session with no discomfort with any motions /exercises requested while in water.  Will continue POC and give HEP for use in water to reinforce next week for future use at community level for continued strengthening.      Personal Factors and Comorbidities Time since onset of injury/illness/exacerbation;Comorbidity 3+    Comorbidities anemia, low hemoglobin, h/o multiple surgeries & abdominal infection    Examination-Activity Limitations Reach Overhead;Bend;Sit;Sleep;Carry;Squat;Stairs;Stand;Lift;Locomotion Level    Examination-Participation Restrictions Occupation;Cleaning;Shop;Laundry;Meal Prep    PT Frequency 2x / week    PT Duration --   3 months   PT Treatment/Interventions ADLs/Self Care Home Management;Cryotherapy;Electrical Stimulation;Gait training;Moist Heat;Stair training;Functional mobility training;Therapeutic activities;Therapeutic exercise;Aquatic Therapy;Neuromuscular re-education;Manual techniques;Patient/family education;Passive range of motion;Dry needling;Taping    PT Next Visit Plan how did aquatics go? infection clear? consider reformer    PT Home Exercise Plan continue with light upper body weights, walking, [id number]    Consulted and Agree with Plan of Care Patient           Patient will benefit from skilled therapeutic intervention in order to improve the following deficits and impairments:  Difficulty walking, Increased muscle spasms, Impaired UE functional use, Decreased endurance, Cardiopulmonary status limiting activity, Decreased activity tolerance, Pain, Decreased strength  Visit Diagnosis: Difficulty in walking, not elsewhere classified  Muscle weakness (generalized)     Problem List Patient Active Problem List   Diagnosis Date Noted   Abnormal findings on flexible sigmoidoscopy 02/22/2020   Abnormal endoscopy of upper gastrointestinal  tract 02/22/2020   History of small bowel obstruction 02/22/2020   Gastritis without bleeding 02/22/2020   Endometriosis 02/22/2020   Rectal bleeding 02/22/2020   Chronic pelvic pain in female 02/22/2020   Chronic generalized abdominal pain 02/22/2020   TOA (tubo-ovarian abscess) 07/24/2019   Tubo-ovarian abscess  07/20/2019   Abdominal pain 07/20/2019   Physical deconditioning 05/14/2019   PID (pelvic inflammatory disease) 05/14/2019   Small bowel obstruction (HCC)    Pelvic mass 05/02/2019   Elevated glucose 11/29/2018   Class 1 obesity with serious comorbidity and body mass index (BMI) of 30.0 to 30.9 in adult 11/29/2018   Voncille Lo, PT, [place] Certified Exercise Expert for the Aging Adult  08/29/20 7:35 PM Phone: [phone] Fax: [facility] [address] [place], [place], [address] Phone: [phone]   Fax:  [phone]  Name: Gwendolyn Nguyen MRN: [id number] Date of Birth: 28-Aug-1986

## 2020-08-31 ENCOUNTER — Other Ambulatory Visit: Payer: Self-pay

## 2020-08-31 ENCOUNTER — Ambulatory Visit (INDEPENDENT_AMBULATORY_CARE_PROVIDER_SITE_OTHER): Payer: BC Managed Care – PPO | Admitting: Registered Nurse

## 2020-08-31 ENCOUNTER — Encounter: Payer: Self-pay | Admitting: Registered Nurse

## 2020-08-31 VITALS — BP 128/86 | HR 87 | Temp 98.0°F | Resp 18 | Ht 66.0 in | Wt 190.8 lb

## 2020-08-31 DIAGNOSIS — R300 Dysuria: Secondary | ICD-10-CM | POA: Diagnosis not present

## 2020-08-31 DIAGNOSIS — N390 Urinary tract infection, site not specified: Secondary | ICD-10-CM

## 2020-08-31 LAB — POCT URINALYSIS DIP (CLINITEK)
Bilirubin, UA: NEGATIVE
Blood, UA: NEGATIVE
Glucose, UA: NEGATIVE mg/dL
Ketones, POC UA: NEGATIVE mg/dL
Leukocytes, UA: NEGATIVE
Nitrite, UA: NEGATIVE
POC PROTEIN,UA: NEGATIVE
Spec Grav, UA: 1.02 (ref 1.010–1.025)
Urobilinogen, UA: 0.2 E.U./dL
pH, UA: 7 (ref 5.0–8.0)

## 2020-08-31 NOTE — Patient Instructions (Signed)
° ° ° °  If you have lab work done today you will be contacted with your lab results within the next 2 weeks.  If you have not heard from us then please contact us. The fastest way to get your results is to register for My Chart. ° ° °IF you received an x-ray today, you will receive an invoice from [facility]. Please contact [facility] at [phone] with questions or concerns regarding your invoice.  ° °IF you received labwork today, you will receive an invoice from [facility]. Please contact [facility] at [phone] with questions or concerns regarding your invoice.  ° °Our billing staff will not be able to assist you with questions regarding bills from these companies. ° °You will be contacted with the lab results as soon as they are available. The fastest way to get your results is to activate your My Chart account. Instructions are located on the last page of this paperwork. If you have not heard from us regarding the results in 2 weeks, please contact this office. °  ° ° ° °

## 2020-08-31 NOTE — Progress Notes (Signed)
Established Patient Office Visit  Subjective:  Patient ID: Gwendolyn Nguyen, female    DOB: 22-Aug-1986  Age: 34 y.o. MRN: [id number]  CC:  Chief Complaint  Patient presents with   Follow-up    Patient states she is here for a follow up to see if she still has an uti.    HPI Gwendolyn Nguyen presents for UTI follow up   Finished treatment, no skipped doses. Good hydration. Continued OTCs.  Felt 1-2 days of ongoing pressure and urgency following completion of treatment - wants to make sure infection is gone. Not having symptoms currently  She is also interested in discussing the safety and efficacy of the COVID vaccine - in particular in relationship to the side effects.  Past Medical History:  Diagnosis Date   Anemia    Back pain    Constipation    Endometriosis    IBS (irritable bowel syndrome)    Low hemoglobin    Multiple food allergies    PID (acute pelvic inflammatory disease) 37/9024   complicated by tuboovarian abscess    Past Surgical History:  Procedure Laterality Date   IR RADIOLOGIST EVAL & MGMT  05/19/2019   IR RADIOLOGIST EVAL & MGMT  05/24/2019   LAPAROSCOPIC LYSIS OF ADHESIONS N/A 07/24/2019   Procedure: Laparoscopic Lysis Of Adhesions, Drainage of Pelvic Abscess;  Surgeon: Servando Salina, MD;  Location: [place];  Service: Gynecology;  Laterality: N/A;   LAPAROSCOPIC LYSIS OF ADHESIONS N/A 07/24/2019   Procedure: Laparoscopic Lysis Of Adhesions and Assist With Drainage of Pelvic Abscess;  Surgeon: Georganna Skeans, MD;  Location: [place];  Service: General;  Laterality: N/A;   LAPAROSCOPY N/A 07/24/2019   Procedure: LAPAROSCOPY DIAGNOSTIC;  Surgeon: Servando Salina, MD;  Location: [place];  Service: Gynecology;  Laterality: N/A;   TOTAL ABDOMINAL HYSTERECTOMY W/ BILATERAL SALPINGOOPHORECTOMY  04/13/2020   full ectocervix not removed  - NEEDS PAP (done at [facility] for endometriosis)    Family History  Problem Relation Age of Onset   Hyperlipidemia  Mother    Hypertension Mother    Obesity Mother    Hyperlipidemia Father    Hypertension Father    Obesity Father    Diabetes Maternal Grandmother    Colon cancer Neg Hx    Esophageal cancer Neg Hx    Inflammatory bowel disease Neg Hx    Liver disease Neg Hx    Pancreatic cancer Neg Hx    Rectal cancer Neg Hx    Stomach cancer Neg Hx     Social History   Socioeconomic History   Marital status: Single    Spouse name: Not on file   Number of children: Not on file   Years of education: Not on file   Highest education level: Not on file  Occupational History   Occupation: Counselor  Tobacco Use   Smoking status: Never Smoker   Smokeless tobacco: Never Used  Scientific laboratory technician Use: Never used  Substance and Sexual Activity   Alcohol use: Yes    Comment: occassional   Drug use: Never   Sexual activity: Not on file  Other Topics Concern   Not on file  Social History Narrative   Not on file   Social Determinants of Health   Financial Resource Strain:    Difficulty of Paying Living Expenses: Not on file  Food Insecurity:    Worried About Running Out of Food in the Last Year: Not on file   [employer] of Food in  the Last Year: Not on file  Transportation Needs:    Lack of Transportation (Medical): Not on file   Lack of Transportation (Non-Medical): Not on file  Physical Activity:    Days of Exercise per Week: Not on file   Minutes of Exercise per Session: Not on file  Stress:    Feeling of Stress : Not on file  Social Connections:    Frequency of Communication with Friends and Family: Not on file   Frequency of Social Gatherings with Friends and Family: Not on file   Attends Religious Services: Not on file   Active Member of Clubs or Organizations: Not on file   Attends Archivist Meetings: Not on file   Marital Status: Not on file  Intimate Partner Violence:    Fear of Current or Ex-Partner: Not on file    Emotionally Abused: Not on file   Physically Abused: Not on file   Sexually Abused: Not on file    Outpatient Medications Prior to Visit  Medication Sig Dispense Refill   cyclobenzaprine (FLEXERIL) 5 MG tablet Take 5 mg by mouth 3 (three) times daily as needed for muscle spasms.     gabapentin (NEURONTIN) 100 MG capsule Take by mouth 3 (three) times daily.      clonazePAM (KLONOPIN) 0.5 MG tablet Take 1 tablet (0.5 mg total) by mouth at bedtime. For GI symptoms. (Patient not taking: Reported on 07/25/2020) 30 tablet 0   DULoxetine (CYMBALTA) 20 MG capsule Take 20 mg by mouth daily. (Patient not taking: Reported on 07/25/2020)     esomeprazole (NEXIUM) 40 MG capsule TAKE 1 CAPSULE (40 MG TOTAL) BY MOUTH DAILY AT 12 NOON. (Patient not taking: Reported on 07/25/2020) 90 capsule 1   letrozole (FEMARA) 2.5 MG tablet Take 2.5 mg by mouth daily. (Patient not taking: Reported on 07/25/2020)     naproxen sodium (ALEVE) 220 MG tablet Take 220 mg by mouth as needed. (Patient not taking: Reported on 08/13/2020)     norethindrone (AYGESTIN) 5 MG tablet Take 5 mg by mouth daily. (Patient not taking: Reported on 07/25/2020)     predniSONE (DELTASONE) 50 MG tablet Take as directed prior to CT scan. (Patient not taking: Reported on 07/25/2020) 3 tablet 0   traMADol (ULTRAM) 50 MG tablet Take by mouth every 6 (six) hours as needed. (Patient not taking: Reported on 07/25/2020)     No facility-administered medications prior to visit.    Allergies  Allergen Reactions   Gadolinium Derivatives Hives, Itching and Nausea And Vomiting    Pt vomited immediately during injection.  15 minutes later, pt developed hives and itching.    Shellfish Allergy Hives   Sulfa Antibiotics Other (See Comments)    Patient was told to NOT TAKE THIS   Iohexol Hives and Rash    08-2018, rash and hives, needs pre meds S/W PATIENT STATED REACTION FROM CT CONTRAST 11/19    ROS Review of Systems  Constitutional: Negative.    HENT: Negative.   Eyes: Negative.   Respiratory: Negative.   Cardiovascular: Negative.   Gastrointestinal: Negative.   Genitourinary: Negative.   Musculoskeletal: Negative.   Skin: Negative.   Neurological: Negative.   Psychiatric/Behavioral: Negative.   All other systems reviewed and are negative.     Objective:    Physical Exam Vitals and nursing note reviewed.  Constitutional:      General: She is not in acute distress.    Appearance: Normal appearance. She is normal weight. She is not  ill-appearing, toxic-appearing or diaphoretic.  Cardiovascular:     Rate and Rhythm: Normal rate and regular rhythm.     Heart sounds: Normal heart sounds. No murmur heard.  No friction rub. No gallop.   Pulmonary:     Effort: Pulmonary effort is normal. No respiratory distress.     Breath sounds: Normal breath sounds. No stridor. No wheezing, rhonchi or rales.  Chest:     Chest wall: No tenderness.  Skin:    General: Skin is warm and dry.  Neurological:     General: No focal deficit present.     Mental Status: She is alert and oriented to person, place, and time. Mental status is at baseline.  Psychiatric:        Mood and Affect: Mood normal.        Behavior: Behavior normal.        Thought Content: Thought content normal.        Judgment: Judgment normal.     BP 128/86    Pulse 87    Temp 98 F (36.7 C) (Temporal)    Resp 18    Ht 5\' 6"  (1.676 m)    Wt 190 lb 12.8 oz (86.5 kg)    SpO2 94%    BMI 30.80 kg/m  Wt Readings from Last 3 Encounters:  08/31/20 190 lb 12.8 oz (86.5 kg)  08/13/20 189 lb (85.7 kg)  02/21/20 182 lb (82.6 kg)     There are no preventive care reminders to display for this patient.  There are no preventive care reminders to display for this patient.  Lab Results  Component Value Date   TSH 2.230 08/13/2020   Lab Results  Component Value Date   WBC 11.2 (H) 02/21/2020   HGB 13.2 02/21/2020   HCT 40.5 02/21/2020   MCV 83.1 02/21/2020   PLT 404.0  (H) 02/21/2020   Lab Results  Component Value Date   NA 133 (L) 02/21/2020   K 4.1 02/21/2020   CO2 29 02/21/2020   GLUCOSE 85 02/21/2020   BUN 10 02/21/2020   CREATININE 1.03 02/21/2020   BILITOT 0.4 02/21/2020   ALKPHOS 78 02/21/2020   AST 116 (H) 02/21/2020   ALT 326 (H) 02/21/2020   PROT 8.1 02/21/2020   ALBUMIN 3.9 02/21/2020   CALCIUM 9.2 02/21/2020   ANIONGAP 13 08/03/2019   GFR 75.15 02/21/2020   Lab Results  Component Value Date   CHOL 150 11/25/2018   Lab Results  Component Value Date   HDL 56 11/25/2018   Lab Results  Component Value Date   LDLCALC 86 11/25/2018   Lab Results  Component Value Date   TRIG 41 11/25/2018   No results found for: CHOLHDL No results found for: HGBA1C    Assessment & Plan:   Problem List Items Addressed This Visit    None    Visit Diagnoses    Urinary tract infection without hematuria, site unspecified    -  Primary   Relevant Orders   POCT URINALYSIS DIP (CLINITEK) (Completed)   Dysuria          No orders of the defined types were placed in this encounter.   Follow-up: No follow-ups on file.   PLAN  POCT UA negative. No indication of ongoing UTI. Patient relieved  Discussed covid vaccine. Questions answered and concerns addressed  Patient encouraged to call clinic with any questions, comments, or concerns.  I spent 31 minutes with this patient, more than 50% of which  was spent counseling and/or educating.  Maximiano Coss, NP

## 2020-09-04 ENCOUNTER — Ambulatory Visit: Payer: BC Managed Care – PPO | Admitting: Physical Therapy

## 2020-09-05 ENCOUNTER — Encounter: Payer: Self-pay | Admitting: Physical Therapy

## 2020-09-05 ENCOUNTER — Ambulatory Visit: Payer: BC Managed Care – PPO | Admitting: Physical Therapy

## 2020-09-05 DIAGNOSIS — R262 Difficulty in walking, not elsewhere classified: Secondary | ICD-10-CM | POA: Diagnosis not present

## 2020-09-05 DIAGNOSIS — M6281 Muscle weakness (generalized): Secondary | ICD-10-CM

## 2020-09-05 NOTE — Therapy (Signed)
[place], [place], [address] Phone: [phone]   Fax:  [phone]  Physical Therapy Treatment  Patient Details  Name: Gwendolyn Nguyen MRN: [id number] Date of Birth: September 15, 1986 Referring Provider (PT): Amalia Hailey, MD   Encounter Date: 09/05/2020   PT End of Session - 09/05/20 1726    Visit Number 8    Number of Visits 30    Date for PT Re-Evaluation 10/25/20    Authorization Type BCBS    PT Start Time 1545    PT Stop Time 1497    PT Time Calculation (min) 50 min    Activity Tolerance Patient tolerated treatment well    Behavior During Therapy [facility] for tasks assessed/performed           Past Medical History:  Diagnosis Date   Anemia    Back pain    Constipation    Endometriosis    IBS (irritable bowel syndrome)    Low hemoglobin    Multiple food allergies    PID (acute pelvic inflammatory disease) 11/6376   complicated by tuboovarian abscess    Past Surgical History:  Procedure Laterality Date   IR RADIOLOGIST EVAL & MGMT  05/19/2019   IR RADIOLOGIST EVAL & MGMT  05/24/2019   LAPAROSCOPIC LYSIS OF ADHESIONS N/A 07/24/2019   Procedure: Laparoscopic Lysis Of Adhesions, Drainage of Pelvic Abscess;  Surgeon: Servando Salina, MD;  Location: [place];  Service: Gynecology;  Laterality: N/A;   LAPAROSCOPIC LYSIS OF ADHESIONS N/A 07/24/2019   Procedure: Laparoscopic Lysis Of Adhesions and Assist With Drainage of Pelvic Abscess;  Surgeon: Georganna Skeans, MD;  Location: [place];  Service: General;  Laterality: N/A;   LAPAROSCOPY N/A 07/24/2019   Procedure: LAPAROSCOPY DIAGNOSTIC;  Surgeon: Servando Salina, MD;  Location: [employer];  Service: Gynecology;  Laterality: N/A;   TOTAL ABDOMINAL HYSTERECTOMY W/ BILATERAL SALPINGOOPHORECTOMY  04/13/2020   full ectocervix not removed  - NEEDS PAP (done at [facility] for endometriosis)    There were no vitals filed for this visit.   Subjective  Assessment - 09/05/20 1725    Subjective I am feeling a lot better.  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)    Pain Score 2     Pain Location Abdomen    Pain Orientation Left    Pain Descriptors / Indicators Discomfort                                     PT Education - 09/05/20 1725    Education Details [place] program in the water today and sending written HEP by email    Person(s) Educated Patient    Methods Explanation;Demonstration;Tactile cues;Verbal cues;Handout    Comprehension Verbalized understanding;Returned demonstration            PT Short Term Goals - 07/25/20 1741      PT SHORT TERM GOAL #1   Title pt will be independent in HEP as it has been established in the short term    Baseline will progress as appropriate    Time 6    Period Weeks    Status New    Target Date 09/05/20      PT SHORT TERM GOAL #2  Title pt will demo proper core engagement in supine, seated and standing    Baseline unable properly at eval    Time 6    Period Weeks    Status New    Target Date 09/05/20      PT SHORT TERM GOAL #3   Title pt will be able to demo good form to squat to grab small objects from floor    Baseline compensates at eval due to difficulty/pain    Time 6    Period Weeks    Status New    Target Date 09/05/20             PT Long Term Goals - 07/25/20 1749      PT LONG TERM GOAL #1   Title pt will be able to lift case of water from [place] unable at eval    Time 3    Period Months    Status New    Target Date 10/25/20      PT LONG TERM GOAL #2   Title 6MWT at least 1300 ft    Baseline 1035 at eval    Time 3    Period Months    Status New    Target Date 10/25/20      PT LONG TERM GOAL #3   Title 5TSTS 12s or less    Baseline 26s at eval    Time 3     Period Months    Status New    Target Date 10/25/20      PT LONG TERM GOAL #4   Title pt will use stairs at work    Baseline unable and uses elevator due to fatigue    Time 3    Period Months    Status New    Target Date 10/25/20      PT LONG TERM GOAL #5   Title pt will be independent in safe, challenging core strength program    Baseline none established at this time    Time 3    Period Months    Status New    Target Date 10/25/20               Aquatic therapy at [facility] - pool temp 86.0 degrees Patient seen for aquatic therapy today. Pt enters with no AD  Treatment took place in water 3.5-4 feet deep depending upon activity.  Pt entered and exited the pool via ramp negotiation with use of bil handrails but not dependent on UE support. Pt reports 2/10 discomfort in L abdomen but reports doing much better. Pt emailed HEP and reinforced HEP in pool today.      On edge of pool with bil UE support  Pt performed LE exercise  Hip abd/add R/L 15 x each and then using 1 UE support Hip ext/flex with knee straight x 15,  Marching knee/hip 90/90 x 15 and then ham curl R/L x 15  Also at edge of pool and using tuck jumps and trunk rotation x 10 each without exacerbation of pain in abdomen.    Squats x 10 reps with intermittent UE support x 2 sets. Lunge to Target 10 x on LT and 10 x on RT Bicycle exercise with UE against pool ledge and back to wall for abdominal and LE engagement.  Squats x 20, lunge to target 15 x 1 on Rt and 15 x 1 on LT   Pt performed water walking forwards, backwards and sideways with  squat x73mx 1 reps of each;  Had pt place UE's under water for increased abdominal engagement   for strengthening using two pool noodles   Pt performed jogging 216m 2 reps across pool with cues for reciprocal arm movement.  Pt then jogged backwards 25 m x 2 and ended with skipping 25 m x 4 Ms DoMillwardhen uses freestyle swimming with  flutter kicks for 20 m x 2. And then dog paddle for  25 m.  Ai Chi handout given at previous aquatic session. for deep breathing and pain control using slow controlled Tai chi like movements with increasing ER/IR of hip and varying angles over knees and ankle to prepare for unlevel ambulation on ground.  PT quickly reviewed for home use with  AiChi  posture of soothing, enclosing and gathering.  Deep breathing encouraged throughout VC.    Bag Radaz, Pt with lumbar belt around hips and nek doodle for neck support. .  PT initially  at torso and assisting with trunk engagment left to right and vice versa to engage trunk muscles.  Emphasis on breathing techniques to draw in abdominals for support.  PT adding rotational component to side to side to engage ext and in obliques. Pool noodle added under arms to increase drag and challenge abdominal intrinsics and extrinsics with rotation. Pt then engages posterior chain with hip ext , knee flexion and hip ext/knee flex against water resisitance.     Pt then gently comes to standing by holding onto pool ledge and takes foot forward step and being aware of multifidus engagement . Bag Radaz uses trunk movements in water to elongate and relax and then engage trunk muscles for improving proprioception and neuromuscular functioning   Pt requires the buoyancy of water for active assisted exercises with buoyancy supported for strengthening & ROM exercises: pt requires the viscosity of the water for resistance with strengthening exercises Water current provides perturbations which challenge standing balance unsupported as well as postural control muscles/core   Access Code: [email or web address]: [email or web address]: 11/17/2021Prepared by: LaDonnetta SimperseardsleyExercises  [facility] - 7 x weekly - 3 sets - 10 reps  Forward Walking Lunge in Shallow Water - 1 x daily - 7 x weekly - 3 sets - 10 reps  Backward Walking Lunge in Shallow Water - 1 x daily - 7 x weekly - 3 sets - 10 reps   Power Skipping - 1 x daily - 7 x weekly - 3 sets - 10 reps  Forward Jog in Shallow Water - 1 x daily - 7 x weekly - 3 sets - 10 reps  Tuck Jumps - 1 x daily - 7 x weekly - 3 sets - 10 reps  Overhead Lunges with Ball - 1 x daily - 7 x weekly - 3 sets - 10 reps        Plan - 09/05/20 1727    Clinical Impression Statement Ms DoArambulanters pool by bil UE support but not relying on UE support on incline into pool. Pt able to participate with more challenging activities such at jogging and skipping as well as freestyle/dog paddle swimming across 25 m pool several times with no abdominal pain or discomfort. Abdominal discomfort is reduced due to hydrostatic pressure around torso making movements more comfortable. Pt was able to participate vigorously for 30 min.  the remainder of time was spend reinforcing Aichi technique and breathing as well as Bad Ragaz techniques to encourage trunk movemeents  in water to elongate and relax and then engage trunk muscles/ abdominal muscles for improving proprioception and neuromuscular function. Ms Stief plans to continue at the [facility] for continuing improvement in strength.    Personal Factors and Comorbidities Time since onset of injury/illness/exacerbation;Comorbidity 3+    Comorbidities anemia, low hemoglobin, h/o multiple surgeries & abdominal infection    Examination-Activity Limitations Reach Overhead;Bend;Sit;Sleep;Carry;Squat;Stairs;Stand;Lift;Locomotion Level    Examination-Participation Restrictions Occupation;Cleaning;Shop;Laundry;Meal Prep    PT Treatment/Interventions ADLs/Self Care Home Management;Cryotherapy;Electrical Stimulation;Gait training;Moist Heat;Stair training;Functional mobility training;Therapeutic activities;Therapeutic exercise;Aquatic Therapy;Neuromuscular re-education;Manual techniques;Patient/family education;Passive range of motion;Dry needling;Taping    PT Next Visit Plan how did aquatics go? infection clear? consider reformer    PT  Home Exercise Plan continue with light upper body weights, walking, DUKGURKY           Patient will benefit from skilled therapeutic intervention in order to improve the following deficits and impairments:  Difficulty walking, Increased muscle spasms, Impaired UE functional use, Decreased endurance, Cardiopulmonary status limiting activity, Decreased activity tolerance, Pain, Decreased strength  Visit Diagnosis: No diagnosis found.     Problem List Patient Active Problem List   Diagnosis Date Noted   Abnormal findings on flexible sigmoidoscopy 02/22/2020   Abnormal endoscopy of upper gastrointestinal tract 02/22/2020   History of small bowel obstruction 02/22/2020   Gastritis without bleeding 02/22/2020   Endometriosis 02/22/2020   Rectal bleeding 02/22/2020   Chronic pelvic pain in female 02/22/2020   Chronic generalized abdominal pain 02/22/2020   TOA (tubo-ovarian abscess) 07/24/2019   Tubo-ovarian abscess 07/20/2019   Abdominal pain 07/20/2019   Physical deconditioning 05/14/2019   PID (pelvic inflammatory disease) 05/14/2019   Small bowel obstruction (HCC)    Pelvic mass 05/02/2019   Elevated glucose 11/29/2018   Class 1 obesity with serious comorbidity and body mass index (BMI) of 30.0 to 30.9 in adult 11/29/2018   Voncille Lo, PT, [place] Certified Exercise Expert for the Aging Adult  09/05/20 6:04 PM Phone: [phone] Fax: [facility] [address] [place], [place], [address] Phone: [phone]   Fax:  [phone]  Name: Kevina Piloto MRN: [id number] Date of Birth: 15-Feb-1986

## 2020-09-05 NOTE — Patient Instructions (Addendum)
[place] with walking back and forth in water increasing speed to increase strength.  To engage more abdominal strength  Twist 2 pool noodles together and submerge Go to pool ledge and perform exercises to warm up 1) Marching in place x 20,  2) With knee straight kick leg forward and backward 20 x (Leg flex/ext) Remember to keep core quiet and engaged as shown in clinic. With knee straight kick leg across body(leading with heel) and away from body (to the side and back and return to across your body as shown in aquatic therapy x 20. Remember to keep core quiet and engaged. 3) Standing by pool ledge,(hamstrings curl) bend knee (as if you are kicking your buttock with your heel) x 20 4) Squat x 20 holding onto pool ledge as deeply as possible 5) Bicycle kick in water with back next to pool wall x 20 6) Flutter kick while trying to swim across pool to engage abdominals and Lower extremities 7) Heel raise x 20 and then single limb heel raise on R and L 8) End with combo runner's stretch/hamstring stretch on step and hold onto rail as needed. 9) With back at edge of pool while holding onto ledge with arms, twist trunk to Rt and LT x 10 to engage abdomen   More challenging power work with jogging, skipping, dog paddle and free style swimming across pool using flutter kick.  Ms Hotard can also float and use legs to kick and abduct and use posterior chain muscles as shown [facility].      Voncille Lo PT, [place] [phone]

## 2020-09-06 ENCOUNTER — Telehealth: Payer: Self-pay | Admitting: Registered Nurse

## 2020-09-06 NOTE — Telephone Encounter (Signed)
Dr. Nolon Rod office called and stated they are wanting the latest labs faxed off. Provider has not reviewed them yet. When they are they are wanting them sent over. Please advise.

## 2020-09-07 NOTE — Telephone Encounter (Signed)
Labs were faxed to Dr. Nolon Rod office on 09/06/20

## 2020-09-11 ENCOUNTER — Telehealth: Payer: Self-pay | Admitting: Physical Therapy

## 2020-09-11 ENCOUNTER — Ambulatory Visit: Payer: BC Managed Care – PPO | Admitting: Physical Therapy

## 2020-09-11 NOTE — Telephone Encounter (Signed)
LVM regarding NS today and advised of next appointment time. Agron Swiney C. Zaeden Lastinger PT, DPT 09/11/20 7:32 PM

## 2020-09-12 ENCOUNTER — Encounter: Payer: Self-pay | Admitting: Physical Therapy

## 2020-09-18 ENCOUNTER — Other Ambulatory Visit: Payer: Self-pay

## 2020-09-18 ENCOUNTER — Ambulatory Visit: Payer: BC Managed Care – PPO | Admitting: Physical Therapy

## 2020-09-18 ENCOUNTER — Encounter: Payer: Self-pay | Admitting: Physical Therapy

## 2020-09-18 VITALS — BP 130/94 | HR 76

## 2020-09-18 DIAGNOSIS — R262 Difficulty in walking, not elsewhere classified: Secondary | ICD-10-CM | POA: Diagnosis not present

## 2020-09-18 DIAGNOSIS — M6281 Muscle weakness (generalized): Secondary | ICD-10-CM

## 2020-09-18 NOTE — Therapy (Signed)
[place], [place], [address] Phone: [phone]   Fax:  [phone]  Physical Therapy Treatment  Patient Details  Name: Gwendolyn Nguyen MRN: [id number] Date of Birth: [birth year] Referring Provider (PT): Amalia Hailey, MD   Encounter Date: 09/18/2020   PT End of Session - 09/18/20 1659    Visit Number 9    Number of Visits 30    Date for PT Re-Evaluation 10/25/20    Authorization Type BCBS    PT Start Time 1625    PT Stop Time 1711    PT Time Calculation (min) 46 min    Activity Tolerance Patient tolerated treatment well    Behavior During Therapy [facility] for tasks assessed/performed           Past Medical History:  Diagnosis Date  . Anemia   . Back pain   . Constipation   . Endometriosis   . IBS (irritable bowel syndrome)   . Low hemoglobin   . Multiple food allergies   . PID (acute pelvic inflammatory disease) 49/4496   complicated by tuboovarian abscess    Past Surgical History:  Procedure Laterality Date  . IR RADIOLOGIST EVAL & MGMT  05/19/2019  . IR RADIOLOGIST EVAL & MGMT  05/24/2019  . LAPAROSCOPIC LYSIS OF ADHESIONS N/A 07/24/2019   Procedure: Laparoscopic Lysis Of Adhesions, Drainage of Pelvic Abscess;  Surgeon: Servando Salina, MD;  Location: [place];  Service: Gynecology;  Laterality: N/A;  . LAPAROSCOPIC LYSIS OF ADHESIONS N/A 07/24/2019   Procedure: Laparoscopic Lysis Of Adhesions and Assist With Drainage of Pelvic Abscess;  Surgeon: Georganna Skeans, MD;  Location: [place];  Service: General;  Laterality: N/A;  . LAPAROSCOPY N/A 07/24/2019   Procedure: LAPAROSCOPY DIAGNOSTIC;  Surgeon: Servando Salina, MD;  Location: [place];  Service: Gynecology;  Laterality: N/A;  . TOTAL ABDOMINAL HYSTERECTOMY W/ BILATERAL SALPINGOOPHORECTOMY  04/13/2020   full ectocervix not removed  - NEEDS PAP (done at [facility] for endometriosis)    Vitals:   09/18/20 1637  BP: (!) 130/94  Pulse: 76  SpO2: 94%       Subjective Assessment - 09/18/20 1649    Subjective No pain pre-tx. Pt. liked aquatic therapy and plans on some independent attendance at the aquatic center. She continues to note some difficulty with lifting activities such as picking up cases of water bottles while shopping and still having some difficulty with stair navigation.    Currently in Pain? No/denies              [facility] PT Assessment - 09/18/20 0001      6 minute walk test results    Aerobic Endurance Distance Walked 1370    Endurance additional comments no breaks                         [facility] Adult PT Treatment/Exercise - 09/18/20 0001      Knee/Hip Exercises: Standing   Forward Step Up Right;Left;2 sets;10 reps;Hand Hold: 1;Step Height: 6"    Other Standing Knee Exercises Deadlift 10 lb. KB from 8 in. step 2x10      Shoulder Exercises: Supine   Other Supine Exercises Reformer squat with shoulder extension bilaterally 2x10 2 red and 1 yellow for resistance      Shoulder Exercises: Standing   Row Limitations TRX row 3x10    Other Standing Exercises Lift/move 8 lb. unweighted crate from chair to counter 2x10    Other Standing Exercises  Freemotion unilat. chest press 3 lbs. 2x10 ea. bilat.                    PT Short Term Goals - 07/25/20 1741      PT SHORT TERM GOAL #1   Title pt will be independent in HEP as it has been established in the short term    Baseline will progress as appropriate    Time 6    Period Weeks    Status New    Target Date 09/05/20      PT SHORT TERM GOAL #2   Title pt will demo proper core engagement in supine, seated and standing    Baseline unable properly at eval    Time 6    Period Weeks    Status New    Target Date 09/05/20      PT SHORT TERM GOAL #3   Title pt will be able to demo good form to squat to grab small objects from floor    Baseline compensates at eval due to difficulty/pain    Time 6    Period Weeks    Status New    Target Date  09/05/20             PT Long Term Goals - 09/18/20 1651      PT LONG TERM GOAL #1   Title pt will be able to lift case of [place] from [place] can briefly lift to put on bottom of shopping cart but unable to carry    Time 3    Period Months    Status On-going      PT LONG TERM GOAL #2   Title 6MWT at least 1300 ft    Baseline 1370 feet 09/18/2020    Time 3    Period Weeks    Status Achieved      PT LONG TERM GOAL #3   Title 5TSTS 12s or less    Baseline 14 sec    Time 3    Period Months    Status On-going      PT LONG TERM GOAL #4   Title pt will use stairs at work    Baseline still mostly uses elevator    Time 3    Period Weeks    Status On-going      PT LONG TERM GOAL #5   Title pt will be independent in safe, challenging core strength program    Baseline updates ongoing    Time 3    Period Months    Status On-going                 Plan - 09/18/20 1659    Clinical Impression Statement Still with functional limitations associated with weakness as noted in subjective and decreased activity tolerance but showing improvement from baseline with LTG for 6 min walk test met. Continued emphasis on strengthening/conditioning with work on light lifting activities to address associated functional limitations with difficulty for lifting. Session well-tolerated regarding no pain but otherwise LTGs still ongoing.    Personal Factors and Comorbidities Time since onset of injury/illness/exacerbation;Comorbidity 3+    Comorbidities anemia, low hemoglobin, h/o multiple surgeries & abdominal infection    Examination-Activity Limitations Reach Overhead;Bend;Sit;Sleep;Carry;Squat;Stairs;Stand;Lift;Locomotion Level    Examination-Participation Restrictions Occupation;Cleaning;Shop;Laundry;Meal Prep    Stability/Clinical Decision Making Unstable/Unpredictable    Clinical Decision Making High    Rehab Potential Good    PT Frequency 2x / week  PT Duration --   3  months   PT Treatment/Interventions ADLs/Self Care Home Management;Cryotherapy;Electrical Stimulation;Gait training;Moist Heat;Stair training;Functional mobility training;Therapeutic activities;Therapeutic exercise;Aquatic Therapy;Neuromuscular re-education;Manual techniques;Patient/family education;Passive range of motion;Dry needling;Taping    PT Next Visit Plan Continue/progress reformer, continue strengthening and endurance, functional activity progression    Consulted and Agree with Plan of Care Patient           Patient will benefit from skilled therapeutic intervention in order to improve the following deficits and impairments:  Difficulty walking, Increased muscle spasms, Impaired UE functional use, Decreased endurance, Cardiopulmonary status limiting activity, Decreased activity tolerance, Pain, Decreased strength  Visit Diagnosis: Difficulty in walking, not elsewhere classified  Muscle weakness (generalized)     Problem List Patient Active Problem List   Diagnosis Date Noted  . Abnormal findings on flexible sigmoidoscopy 02/22/2020  . Abnormal endoscopy of upper gastrointestinal tract 02/22/2020  . History of small bowel obstruction 02/22/2020  . Gastritis without bleeding 02/22/2020  . Endometriosis 02/22/2020  . Rectal bleeding 02/22/2020  . Chronic pelvic pain in female 02/22/2020  . Chronic generalized abdominal pain 02/22/2020  . TOA (tubo-ovarian abscess) 07/24/2019  . Tubo-ovarian abscess 07/20/2019  . Abdominal pain 07/20/2019  . Physical deconditioning 05/14/2019  . PID (pelvic inflammatory disease) 05/14/2019  . Small bowel obstruction ([place])   . Pelvic mass 05/02/2019  . Elevated glucose 11/29/2018  . Class 1 obesity with serious comorbidity and body mass index (BMI) of 30.0 to 30.9 in adult 11/29/2018    Beaulah Dinning, PT, DPT 09/18/20 5:13 PM  [place] [facility] [address] [place], [place],  [address] Phone: [phone]   Fax:  [phone]  Name: Taci Sterling MRN: [id number] Date of Birth: 28-Mar-1986

## 2020-09-25 ENCOUNTER — Ambulatory Visit: Payer: BC Managed Care – PPO | Attending: Obstetrics and Gynecology | Admitting: Physical Therapy

## 2020-09-25 ENCOUNTER — Other Ambulatory Visit: Payer: Self-pay

## 2020-09-25 ENCOUNTER — Encounter: Payer: Self-pay | Admitting: Physical Therapy

## 2020-09-25 VITALS — BP 130/94 | HR 71

## 2020-09-25 DIAGNOSIS — R262 Difficulty in walking, not elsewhere classified: Secondary | ICD-10-CM | POA: Diagnosis present

## 2020-09-25 DIAGNOSIS — M6281 Muscle weakness (generalized): Secondary | ICD-10-CM | POA: Insufficient documentation

## 2020-09-25 NOTE — Therapy (Signed)
[place], [place], [address] Phone: [phone]   Fax:  [phone]  Physical Therapy Treatment  Patient Details  Name: Gwendolyn Nguyen MRN: [id number] Date of Birth: 27-Mar-1986 Referring Provider (PT): Amalia Hailey, MD   Encounter Date: 09/25/2020   PT End of Session - 09/25/20 1810    Visit Number 10    Number of Visits 30    Date for PT Re-Evaluation 10/25/20    Authorization Type BCBS    PT Start Time 9470    PT Stop Time 1803    PT Time Calculation (min) 48 min    Activity Tolerance Patient tolerated treatment well    Behavior During Therapy [facility] for tasks assessed/performed           Past Medical History:  Diagnosis Date  . Anemia   . Back pain   . Constipation   . Endometriosis   . IBS (irritable bowel syndrome)   . Low hemoglobin   . Multiple food allergies   . PID (acute pelvic inflammatory disease) 96/2836   complicated by tuboovarian abscess    Past Surgical History:  Procedure Laterality Date  . IR RADIOLOGIST EVAL & MGMT  05/19/2019  . IR RADIOLOGIST EVAL & MGMT  05/24/2019  . LAPAROSCOPIC LYSIS OF ADHESIONS N/A 07/24/2019   Procedure: Laparoscopic Lysis Of Adhesions, Drainage of Pelvic Abscess;  Surgeon: Servando Salina, MD;  Location: [place];  Service: Gynecology;  Laterality: N/A;  . LAPAROSCOPIC LYSIS OF ADHESIONS N/A 07/24/2019   Procedure: Laparoscopic Lysis Of Adhesions and Assist With Drainage of Pelvic Abscess;  Surgeon: Georganna Skeans, MD;  Location: [place];  Service: General;  Laterality: N/A;  . LAPAROSCOPY N/A 07/24/2019   Procedure: LAPAROSCOPY DIAGNOSTIC;  Surgeon: Servando Salina, MD;  Location: [place];  Service: Gynecology;  Laterality: N/A;  . TOTAL ABDOMINAL HYSTERECTOMY W/ BILATERAL SALPINGOOPHORECTOMY  04/13/2020   full ectocervix not removed  - NEEDS PAP (done at [facility] for endometriosis)    Vitals:   09/25/20 1809  BP: (!) 130/94  Pulse: 71  SpO2: 99%       Subjective Assessment - 09/25/20 1809    Subjective No major soreness after last session and no pain pre-tx.    Currently in Pain? No/denies                             [facility] Adult PT Treatment/Exercise - 09/25/20 0001      Knee/Hip Exercises: Aerobic   Tread Mill 1.4-1.7 mph x 6 min      Knee/Hip Exercises: Machines for Strengthening   Cybex Leg Press 45 lbs. 2x10 bilat. LE      Knee/Hip Exercises: Standing   Lateral Step Up Right;Left;15 reps;Hand Hold: 2    Lateral Step Up Limitations blue side BOSU    Forward Step Up Right;Left;15 reps;Step Height: 8"    Functional Squat Limitations front squat with 10 lb KB 2x10 with touch to incline wedge on low mat    Other Standing Knee Exercises lateral dynamic balance with weightshifts on black side BOSU 2x30 sec    Other Standing Knee Exercises deadlift 10 lb. KB from 8 in. step 2x10      Knee/Hip Exercises: Supine   Other Supine Knee/Hip Exercises Pilates reformer squat with bilat. shoulder extension pull through 2 reds and 1 blue 2x10      Shoulder Exercises: Standing   Row Limitations TRX row 2x10  Other Standing Exercises TRX push up/press 2x10    Other Standing Exercises Pall off press form partial tandem stance x 15reps ea. bilat. with blue band                  PT Education - 09/25/20 1809    Education Details exercises    Person(s) Educated Patient    Methods Explanation;Demonstration;Verbal cues    Comprehension Verbalized understanding;Returned demonstration            PT Short Term Goals - 07/25/20 1741      PT SHORT TERM GOAL #1   Title pt will be independent in HEP as it has been established in the short term    Baseline will progress as appropriate    Time 6    Period Weeks    Status New    Target Date 09/05/20      PT SHORT TERM GOAL #2   Title pt will demo proper core engagement in supine, seated and standing    Baseline unable properly at eval    Time 6    Period  Weeks    Status New    Target Date 09/05/20      PT SHORT TERM GOAL #3   Title pt will be able to demo good form to squat to grab small objects from floor    Baseline compensates at eval due to difficulty/pain    Time 6    Period Weeks    Status New    Target Date 09/05/20             PT Long Term Goals - 09/18/20 1651      PT LONG TERM GOAL #1   Title pt will be able to lift case of water from [place] can briefly lift to put on bottom of shopping cart but unable to carry    Time 3    Period Months    Status On-going      PT LONG TERM GOAL #2   Title 6MWT at least 1300 ft    Baseline 1370 feet 09/18/2020    Time 3    Period Weeks    Status Achieved      PT LONG TERM GOAL #3   Title 5TSTS 12s or less    Baseline 14 sec    Time 3    Period Months    Status On-going      PT LONG TERM GOAL #4   Title pt will use stairs at work    Baseline still mostly uses elevator    Time 3    Period Weeks    Status On-going      PT LONG TERM GOAL #5   Title pt will be independent in safe, challenging core strength program    Baseline updates ongoing    Time 3    Period Months    Status On-going                 Plan - 09/25/20 1810    Clinical Impression Statement Continued tx. focus on general strengthening and core work to improve functional activity tolerance and ability for activities such as stair navigation. Challenged with TRX standing "push up" variation but otherwise and overall activity tolerance continues to improve and able to progress step height and challenge for stepping exercises with 8 in. step and blue side BOSU.    Personal Factors and Comorbidities Time since onset of injury/illness/exacerbation;Comorbidity 3+  Comorbidities anemia, low hemoglobin, h/o multiple surgeries & abdominal infection    Examination-Activity Limitations Reach Overhead;Bend;Sit;Sleep;Carry;Squat;Stairs;Stand;Lift;Locomotion Level    Examination-Participation  Restrictions Occupation;Cleaning;Shop;Laundry;Meal Prep    Stability/Clinical Decision Making Unstable/Unpredictable    Clinical Decision Making High    Rehab Potential Good    PT Frequency 2x / week    PT Duration --   3 months   PT Treatment/Interventions ADLs/Self Care Home Management;Cryotherapy;Electrical Stimulation;Gait training;Moist Heat;Stair training;Functional mobility training;Therapeutic activities;Therapeutic exercise;Aquatic Therapy;Neuromuscular re-education;Manual techniques;Patient/family education;Passive range of motion;Dry needling;Taping    PT Next Visit Plan Continue/progress reformer, continue strengthening and endurance, functional activity progression    PT Home Exercise Plan continue with light upper body weights, walking, [id number]  [id number]    Consulted and Agree with Plan of Care Patient           Patient will benefit from skilled therapeutic intervention in order to improve the following deficits and impairments:  Difficulty walking, Increased muscle spasms, Impaired UE functional use, Decreased endurance, Cardiopulmonary status limiting activity, Decreased activity tolerance, Pain, Decreased strength  Visit Diagnosis: Difficulty in walking, not elsewhere classified  Muscle weakness (generalized)     Problem List Patient Active Problem List   Diagnosis Date Noted  . Abnormal findings on flexible sigmoidoscopy 02/22/2020  . Abnormal endoscopy of upper gastrointestinal tract 02/22/2020  . History of small bowel obstruction 02/22/2020  . Gastritis without bleeding 02/22/2020  . Endometriosis 02/22/2020  . Rectal bleeding 02/22/2020  . Chronic pelvic pain in female 02/22/2020  . Chronic generalized abdominal pain 02/22/2020  . TOA (tubo-ovarian abscess) 07/24/2019  . Tubo-ovarian abscess 07/20/2019  . Abdominal pain 07/20/2019  . Physical deconditioning 05/14/2019  . PID (pelvic inflammatory disease) 05/14/2019  . Small bowel obstruction  ([place])   . Pelvic mass 05/02/2019  . Elevated glucose 11/29/2018  . Class 1 obesity with serious comorbidity and body mass index (BMI) of 30.0 to 30.9 in adult 11/29/2018    Beaulah Dinning, PT, DPT 09/25/20 [facility] [address] [place], [place], [address] Phone: [phone]   Fax:  [phone]  Name: Gwendolyn Nguyen MRN: [id number] Date of Birth: 1986-05-19

## 2020-09-27 ENCOUNTER — Encounter: Payer: Self-pay | Admitting: Physical Therapy

## 2020-09-27 ENCOUNTER — Ambulatory Visit: Payer: BC Managed Care – PPO | Admitting: Physical Therapy

## 2020-09-27 ENCOUNTER — Other Ambulatory Visit: Payer: Self-pay

## 2020-09-27 VITALS — BP 134/88 | HR 71

## 2020-09-27 DIAGNOSIS — R262 Difficulty in walking, not elsewhere classified: Secondary | ICD-10-CM | POA: Diagnosis not present

## 2020-09-27 DIAGNOSIS — M6281 Muscle weakness (generalized): Secondary | ICD-10-CM

## 2020-09-27 NOTE — Therapy (Signed)
[place], [place], [address] Phone: [phone]   Fax:  [phone]  Physical Therapy Treatment  Patient Details  Name: Gwendolyn Nguyen MRN: [id number] Date of Birth: November 03, 1985 Referring Provider (PT): Amalia Hailey, MD   Encounter Date: 09/27/2020   PT End of Session - 09/27/20 1732    Visit Number 11    Number of Visits 30    Date for PT Re-Evaluation 10/25/20    Authorization Type BCBS    PT Start Time 1718    PT Stop Time 1800    PT Time Calculation (min) 42 min    Activity Tolerance Patient tolerated treatment well    Behavior During Therapy [facility] for tasks assessed/performed           Past Medical History:  Diagnosis Date  . Anemia   . Back pain   . Constipation   . Endometriosis   . IBS (irritable bowel syndrome)   . Low hemoglobin   . Multiple food allergies   . PID (acute pelvic inflammatory disease) 93/7902   complicated by tuboovarian abscess    Past Surgical History:  Procedure Laterality Date  . IR RADIOLOGIST EVAL & MGMT  05/19/2019  . IR RADIOLOGIST EVAL & MGMT  05/24/2019  . LAPAROSCOPIC LYSIS OF ADHESIONS N/A 07/24/2019   Procedure: Laparoscopic Lysis Of Adhesions, Drainage of Pelvic Abscess;  Surgeon: Servando Salina, MD;  Location: [place];  Service: Gynecology;  Laterality: N/A;  . LAPAROSCOPIC LYSIS OF ADHESIONS N/A 07/24/2019   Procedure: Laparoscopic Lysis Of Adhesions and Assist With Drainage of Pelvic Abscess;  Surgeon: Georganna Skeans, MD;  Location: [place];  Service: General;  Laterality: N/A;  . LAPAROSCOPY N/A 07/24/2019   Procedure: LAPAROSCOPY DIAGNOSTIC;  Surgeon: Servando Salina, MD;  Location: [place];  Service: Gynecology;  Laterality: N/A;  . TOTAL ABDOMINAL HYSTERECTOMY W/ BILATERAL SALPINGOOPHORECTOMY  04/13/2020   full ectocervix not removed  - NEEDS PAP (done at [facility] for endometriosis)    Vitals:   09/27/20 1726  BP: 134/88  Pulse: 71  SpO2: 99%      Subjective Assessment - 09/27/20 1727    Subjective Mild soreness after last session (soreness) minimal. Less tired with stairs at work but still fatigues easily/needs to use elevator if having to navigate upstairs multiple times during the day.    Currently in Pain? No/denies                             [facility] Adult PT Treatment/Exercise - 09/27/20 0001      Knee/Hip Exercises: Aerobic   Tread Mill 1.8 mph x 5 min      Knee/Hip Exercises: Machines for Strengthening   Cybex Leg Press 45 lbs. 2x10 bilat. LE      Knee/Hip Exercises: Standing   Forward Step Up Left;Right;2 sets;10 reps;Hand Hold: 0;Step Height: 6"    Forward Step Up Limitations holding 5 lb. DB ea. UE bilat.    Other Standing Knee Exercises deadlift 10 lb. KB from 6 in. step 2x10    Other Standing Knee Exercises black side BOSU alt. weightshifts at counter 2x10 sec, SLS on blue Theraband pad x 5-10 sec x 1 rep followed by 3-way vector touch x 12 ea. bilat.      Knee/Hip Exercises: Supine   Other Supine Knee/Hip Exercises Pilates reformer squat with bilat. shoulder extension pull through 2 reds and 1 blue 2x10  Shoulder Exercises: Standing   Other Standing Exercises TRX push up/press 2x10      Shoulder Exercises: Therapy Ball   Other Therapy Ball Exercises seatd on 85 cm P-ball: rows 7 lbs. 2x10 and shoulder extension 3 lbs. 2x10 both using bilat. UE    Other Therapy Ball Exercises pall off press seated on 85 cm P-ball x 15 ea. way bilat. with 7 lbs., seated alternating UE/LE raises 2x10                    PT Short Term Goals - 07/25/20 1741      PT SHORT TERM GOAL #1   Title pt will be independent in HEP as it has been established in the short term    Baseline will progress as appropriate    Time 6    Period Weeks    Status New    Target Date 09/05/20      PT SHORT TERM GOAL #2   Title pt will demo proper core engagement in supine, seated and standing    Baseline unable  properly at eval    Time 6    Period Weeks    Status New    Target Date 09/05/20      PT SHORT TERM GOAL #3   Title pt will be able to demo good form to squat to grab small objects from floor    Baseline compensates at eval due to difficulty/pain    Time 6    Period Weeks    Status New    Target Date 09/05/20             PT Long Term Goals - 09/18/20 1651      PT LONG TERM GOAL #1   Title pt will be able to lift case of water from [place] can briefly lift to put on bottom of shopping cart but unable to carry    Time 3    Period Months    Status On-going      PT LONG TERM GOAL #2   Title 6MWT at least 1300 ft    Baseline 1370 feet 09/18/2020    Time 3    Period Weeks    Status Achieved      PT LONG TERM GOAL #3   Title 5TSTS 12s or less    Baseline 14 sec    Time 3    Period Months    Status On-going      PT LONG TERM GOAL #4   Title pt will use stairs at work    Baseline still mostly uses elevator    Time 3    Period Weeks    Status On-going      PT LONG TERM GOAL #5   Title pt will be independent in safe, challenging core strength program    Baseline updates ongoing    Time 3    Period Months    Status On-going                 Plan - 09/27/20 1806    Clinical Impression Statement Activity tolerance gradually improving for ambulation and LE strength gradually improving as well with ability to progress step ups in clinic with added resistance today and overall progression of PREs.    Personal Factors and Comorbidities Time since onset of injury/illness/exacerbation;Comorbidity 3+    Comorbidities anemia, low hemoglobin, h/o multiple surgeries & abdominal infection    Examination-Activity Limitations Reach Overhead;Bend;Sit;Sleep;Carry;Squat;Stairs;Stand;Lift;Locomotion  Level    Examination-Participation Restrictions Occupation;Cleaning;Shop;Laundry;Meal Prep    Stability/Clinical Decision Making Unstable/Unpredictable    Clinical  Decision Making High    Rehab Potential Good    PT Frequency 2x / week    PT Duration --   3 months   PT Treatment/Interventions ADLs/Self Care Home Management;Cryotherapy;Electrical Stimulation;Gait training;Moist Heat;Stair training;Functional mobility training;Therapeutic activities;Therapeutic exercise;Aquatic Therapy;Neuromuscular re-education;Manual techniques;Patient/family education;Passive range of motion;Dry needling;Taping    PT Next Visit Plan Continue/progress reformer, continue strengthening and endurance, functional activity progression    PT Home Exercise Plan continue with light upper body weights, walking, [id number]  [id number]    Consulted and Agree with Plan of Care Patient           Patient will benefit from skilled therapeutic intervention in order to improve the following deficits and impairments:  Difficulty walking,Increased muscle spasms,Impaired UE functional use,Decreased endurance,Cardiopulmonary status limiting activity,Decreased activity tolerance,Pain,Decreased strength  Visit Diagnosis: Difficulty in walking, not elsewhere classified  Muscle weakness (generalized)     Problem List Patient Active Problem List   Diagnosis Date Noted  . Abnormal findings on flexible sigmoidoscopy 02/22/2020  . Abnormal endoscopy of upper gastrointestinal tract 02/22/2020  . History of small bowel obstruction 02/22/2020  . Gastritis without bleeding 02/22/2020  . Endometriosis 02/22/2020  . Rectal bleeding 02/22/2020  . Chronic pelvic pain in female 02/22/2020  . Chronic generalized abdominal pain 02/22/2020  . TOA (tubo-ovarian abscess) 07/24/2019  . Tubo-ovarian abscess 07/20/2019  . Abdominal pain 07/20/2019  . Physical deconditioning 05/14/2019  . PID (pelvic inflammatory disease) 05/14/2019  . Small bowel obstruction ([place])   . Pelvic mass 05/02/2019  . Elevated glucose 11/29/2018  . Class 1 obesity with serious comorbidity and body mass index (BMI) of  30.0 to 30.9 in adult 11/29/2018    Beaulah Dinning, PT, DPT 09/27/20 6:08 PM  [facility] [address] [place], [place], [address] Phone: [phone]   Fax:  [phone]  Name: Alayza Pieper MRN: [id number] Date of Birth: 08-30-1986

## 2020-10-02 ENCOUNTER — Other Ambulatory Visit: Payer: Self-pay

## 2020-10-02 ENCOUNTER — Encounter: Payer: Self-pay | Admitting: Physical Therapy

## 2020-10-02 ENCOUNTER — Ambulatory Visit: Payer: BC Managed Care – PPO | Admitting: Physical Therapy

## 2020-10-02 VITALS — BP 142/94 | HR 89

## 2020-10-02 DIAGNOSIS — R262 Difficulty in walking, not elsewhere classified: Secondary | ICD-10-CM | POA: Diagnosis not present

## 2020-10-02 DIAGNOSIS — M6281 Muscle weakness (generalized): Secondary | ICD-10-CM

## 2020-10-02 NOTE — Therapy (Signed)
[place], [place], [address] Phone: [phone]   Fax:  [phone]  Physical Therapy Treatment  Patient Details  Name: Gwendolyn Nguyen MRN: [id number] Date of Birth: Mar 18, 1986 Referring Provider (PT): Amalia Hailey, MD   Encounter Date: 10/02/2020   PT End of Session - 10/02/20 1724    Visit Number 12    Number of Visits 30    Date for PT Re-Evaluation 10/25/20    Authorization Type BCBS    PT Start Time 1718    PT Stop Time 1801    PT Time Calculation (min) 43 min    Activity Tolerance Patient tolerated treatment well    Behavior During Therapy [facility] for tasks assessed/performed           Past Medical History:  Diagnosis Date  . Anemia   . Back pain   . Constipation   . Endometriosis   . IBS (irritable bowel syndrome)   . Low hemoglobin   . Multiple food allergies   . PID (acute pelvic inflammatory disease) 99/8338   complicated by tuboovarian abscess    Past Surgical History:  Procedure Laterality Date  . IR RADIOLOGIST EVAL & MGMT  05/19/2019  . IR RADIOLOGIST EVAL & MGMT  05/24/2019  . LAPAROSCOPIC LYSIS OF ADHESIONS N/A 07/24/2019   Procedure: Laparoscopic Lysis Of Adhesions, Drainage of Pelvic Abscess;  Surgeon: Servando Salina, MD;  Location: [place];  Service: Gynecology;  Laterality: N/A;  . LAPAROSCOPIC LYSIS OF ADHESIONS N/A 07/24/2019   Procedure: Laparoscopic Lysis Of Adhesions and Assist With Drainage of Pelvic Abscess;  Surgeon: Georganna Skeans, MD;  Location: [place];  Service: General;  Laterality: N/A;  . LAPAROSCOPY N/A 07/24/2019   Procedure: LAPAROSCOPY DIAGNOSTIC;  Surgeon: Servando Salina, MD;  Location: [place];  Service: Gynecology;  Laterality: N/A;  . TOTAL ABDOMINAL HYSTERECTOMY W/ BILATERAL SALPINGOOPHORECTOMY  04/13/2020   full ectocervix not removed  - NEEDS PAP (done at [facility] for endometriosis)    Vitals:   10/02/20 1722  BP: (!) 142/94  Pulse: 89  SpO2: 99%      Subjective Assessment - 10/02/20 1724    Subjective Some muscle soreness but reports no pain pre-tx. Pt. reports feels like strength improving and walking/activity tolerance improving from previous status but still limited with prolonged walking and more vigorous activities/chores/lifting activities.    Currently in Pain? No/denies              [facility] PT Assessment - 10/02/20 0001      Observation/Other Assessments   Focus on Therapeutic Outcomes (FOTO)  49% limited                         [facility] Adult PT Treatment/Exercise - 10/02/20 0001      Knee/Hip Exercises: Aerobic   Tread Mill 2.0 mph x 6 minutes      Knee/Hip Exercises: Machines for Strengthening   Cybex Leg Press 45 lbs. 2x10 bilat. LE      Knee/Hip Exercises: Standing   Forward Step Up Left;Right;2 sets;10 reps;Hand Hold: 0;Step Height: 6"    Forward Step Up Limitations holding 5 lb. DB ea. UE bilat.    Other Standing Knee Exercises lift wooden crate/box from fllot to counter top with 5 lbs. added x 5 reps and then empty box x 5 reps      Knee/Hip Exercises: Supine   Other Supine Knee/Hip Exercises Pilates reformer alteranting SL squats 2 reds x  20 reps ea. bilat., bilat. squat with bilat. shoulder extension pull through with bar 2x10      Shoulder Exercises: Standing   Row Limitations Freemotion cable row 3 lbs. bilat. UE 3x10    Other Standing Exercises Freemotion cable "woodchop" x 15ea. way    Other Standing Exercises TRX press/push up 2x10, Theraband "landmine" press red band 2x10 ea. bilat.                  PT Education - 10/02/20 1806    Education Details FOTO score, exercises    Person(s) Educated Patient    Methods Explanation;Demonstration    Comprehension Verbalized understanding;Returned demonstration            PT Short Term Goals - 07/25/20 1741      PT SHORT TERM GOAL #1   Title pt will be independent in HEP as it has been established in the short term     Baseline will progress as appropriate    Time 6    Period Weeks    Status New    Target Date 09/05/20      PT SHORT TERM GOAL #2   Title pt will demo proper core engagement in supine, seated and standing    Baseline unable properly at eval    Time 6    Period Weeks    Status New    Target Date 09/05/20      PT SHORT TERM GOAL #3   Title pt will be able to demo good form to squat to grab small objects from floor    Baseline compensates at eval due to difficulty/pain    Time 6    Period Weeks    Status New    Target Date 09/05/20             PT Long Term Goals - 09/18/20 1651      PT LONG TERM GOAL #1   Title pt will be able to lift case of water from [place] can briefly lift to put on bottom of shopping cart but unable to carry    Time 3    Period Months    Status On-going      PT LONG TERM GOAL #2   Title 6MWT at least 1300 ft    Baseline 1370 feet 09/18/2020    Time 3    Period Weeks    Status Achieved      PT LONG TERM GOAL #3   Title 5TSTS 12s or less    Baseline 14 sec    Time 3    Period Months    Status On-going      PT LONG TERM GOAL #4   Title pt will use stairs at work    Baseline still mostly uses elevator    Time 3    Period Weeks    Status On-going      PT LONG TERM GOAL #5   Title pt will be independent in safe, challenging core strength program    Baseline updates ongoing    Time 3    Period Months    Status On-going                 Plan - 10/02/20 1807    Clinical Impression Statement Rechecked FOTO today with some setback noted in score from previous status but pt. reports answers more reflective of knowlegde of limitations at this point than at baseline and is making gains  with endurance, strength and functional activity tolerance. Challenged in particular today with lifting box from floor to counter thus limited weight/reps to tolerance. Pt. would benefit from continued PT for further progress to work on improving  functional activity tolerance and strength for ADLs/IADLs.    Personal Factors and Comorbidities Time since onset of injury/illness/exacerbation;Comorbidity 3+    Comorbidities anemia, low hemoglobin, h/o multiple surgeries & abdominal infection    Examination-Activity Limitations Reach Overhead;Bend;Sit;Sleep;Carry;Squat;Stairs;Stand;Lift;Locomotion Level    Examination-Participation Restrictions Occupation;Cleaning;Shop;Laundry;Meal Prep    Stability/Clinical Decision Making Unstable/Unpredictable    Clinical Decision Making High    Rehab Potential Good    PT Frequency 2x / week    PT Duration --   3 months   PT Treatment/Interventions ADLs/Self Care Home Management;Cryotherapy;Electrical Stimulation;Gait training;Moist Heat;Stair training;Functional mobility training;Therapeutic activities;Therapeutic exercise;Aquatic Therapy;Neuromuscular re-education;Manual techniques;Patient/family education;Passive range of motion;Dry needling;Taping    PT Next Visit Plan Continue/progress reformer, continue strengthening and endurance, functional activity progression    PT Home Exercise Plan continue with light upper body weights, walking, [id number]  [id number]    Consulted and Agree with Plan of Care Patient           Patient will benefit from skilled therapeutic intervention in order to improve the following deficits and impairments:  Difficulty walking,Increased muscle spasms,Impaired UE functional use,Decreased endurance,Cardiopulmonary status limiting activity,Decreased activity tolerance,Pain,Decreased strength  Visit Diagnosis: Difficulty in walking, not elsewhere classified  Muscle weakness (generalized)     Problem List Patient Active Problem List   Diagnosis Date Noted  . Abnormal findings on flexible sigmoidoscopy 02/22/2020  . Abnormal endoscopy of upper gastrointestinal tract 02/22/2020  . History of small bowel obstruction 02/22/2020  . Gastritis without bleeding  02/22/2020  . Endometriosis 02/22/2020  . Rectal bleeding 02/22/2020  . Chronic pelvic pain in female 02/22/2020  . Chronic generalized abdominal pain 02/22/2020  . TOA (tubo-ovarian abscess) 07/24/2019  . Tubo-ovarian abscess 07/20/2019  . Abdominal pain 07/20/2019  . Physical deconditioning 05/14/2019  . PID (pelvic inflammatory disease) 05/14/2019  . Small bowel obstruction ([place])   . Pelvic mass 05/02/2019  . Elevated glucose 11/29/2018  . Class 1 obesity with serious comorbidity and body mass index (BMI) of 30.0 to 30.9 in adult 11/29/2018    Beaulah Dinning, PT, DPT 10/02/20 6:10 PM  [place], [place], [address] Phone: [phone]   Fax:  [phone]  Name: Gwendolyn Nguyen MRN: [id number] Date of Birth: Aug 03, 1986

## 2020-10-04 ENCOUNTER — Encounter: Payer: Self-pay | Admitting: Physical Therapy

## 2020-10-04 ENCOUNTER — Ambulatory Visit: Payer: BC Managed Care – PPO | Admitting: Physical Therapy

## 2020-10-04 ENCOUNTER — Other Ambulatory Visit: Payer: Self-pay

## 2020-10-04 VITALS — BP 122/80

## 2020-10-04 DIAGNOSIS — R262 Difficulty in walking, not elsewhere classified: Secondary | ICD-10-CM

## 2020-10-04 DIAGNOSIS — M6281 Muscle weakness (generalized): Secondary | ICD-10-CM

## 2020-10-04 NOTE — Therapy (Signed)
[place], [place], [address] Phone: [phone]   Fax:  [phone]  Physical Therapy Treatment  Patient Details  Name: Gwendolyn Nguyen MRN: [id number] Date of Birth: 1986/05/30 Referring Provider (PT): Amalia Hailey, MD   Encounter Date: 10/04/2020   PT End of Session - 10/04/20 1808    Visit Number 13    Number of Visits 30    Date for PT Re-Evaluation 10/25/20    Authorization Type BCBS    PT Start Time 1717    PT Stop Time 1800    PT Time Calculation (min) 43 min    Activity Tolerance Patient tolerated treatment well    Behavior During Therapy [facility] for tasks assessed/performed           Past Medical History:  Diagnosis Date   Anemia    Back pain    Constipation    Endometriosis    IBS (irritable bowel syndrome)    Low hemoglobin    Multiple food allergies    PID (acute pelvic inflammatory disease) 38/1017   complicated by tuboovarian abscess    Past Surgical History:  Procedure Laterality Date   IR RADIOLOGIST EVAL & MGMT  05/19/2019   IR RADIOLOGIST EVAL & MGMT  05/24/2019   LAPAROSCOPIC LYSIS OF ADHESIONS N/A 07/24/2019   Procedure: Laparoscopic Lysis Of Adhesions, Drainage of Pelvic Abscess;  Surgeon: Servando Salina, MD;  Location: [place];  Service: Gynecology;  Laterality: N/A;   LAPAROSCOPIC LYSIS OF ADHESIONS N/A 07/24/2019   Procedure: Laparoscopic Lysis Of Adhesions and Assist With Drainage of Pelvic Abscess;  Surgeon: Georganna Skeans, MD;  Location: [place];  Service: General;  Laterality: N/A;   LAPAROSCOPY N/A 07/24/2019   Procedure: LAPAROSCOPY DIAGNOSTIC;  Surgeon: Servando Salina, MD;  Location: [place];  Service: Gynecology;  Laterality: N/A;   TOTAL ABDOMINAL HYSTERECTOMY W/ BILATERAL SALPINGOOPHORECTOMY  04/13/2020   full ectocervix not removed  - NEEDS PAP (done at [facility] for endometriosis)    Vitals:   10/04/20 1749  BP: 122/80     Subjective Assessment  - 10/04/20 1750    Subjective No significant soreness after exercise progression last session-no new complaints/concerns otherwise this PM.                             [facility] Adult PT Treatment/Exercise - 10/04/20 0001      Pilates   Pilates Reformer single leg squat with opposite hip extension with foot in strap 2x10 ea. bilat. with 1 red cord      Knee/Hip Exercises: Aerobic   Elliptical ramp 3 level 1 x 6 min      Knee/Hip Exercises: Machines for Strengthening   Cybex Leg Press 55 lbs. 2x10 bilat. LE      Knee/Hip Exercises: Standing   Forward Step Up Left;Right;2 sets;10 reps;Hand Hold: 0;Step Height: 6"    Forward Step Up Limitations holding 5 lb. DB ea. UE bilat.      Shoulder Exercises: Standing   Row Limitations Freemotion squat/row combo with 3 lbs. bilat. 2x10    Other Standing Exercises lift/move 8 lb. crate with 5 lb. weight added from chair to counter top 3x5, TRX superman "circles" with bilat. UE 2x10    Other Standing Exercises modified wall push up to 65 cm P-ball on counter top 2x10                  PT Education - 10/04/20  30    Education Details exercises    Person(s) Educated Patient    Methods Explanation    Comprehension Verbalized understanding;Returned demonstration            PT Short Term Goals - 07/25/20 1741      PT SHORT TERM GOAL #1   Title pt will be independent in HEP as it has been established in the short term    Baseline will progress as appropriate    Time 6    Period Weeks    Status New    Target Date 09/05/20      PT SHORT TERM GOAL #2   Title pt will demo proper core engagement in supine, seated and standing    Baseline unable properly at eval    Time 6    Period Weeks    Status New    Target Date 09/05/20      PT SHORT TERM GOAL #3   Title pt will be able to demo good form to squat to grab small objects from floor    Baseline compensates at eval due to difficulty/pain    Time 6    Period Weeks     Status New    Target Date 09/05/20             PT Long Term Goals - 09/18/20 1651      PT LONG TERM GOAL #1   Title pt will be able to lift case of water from [place] can briefly lift to put on bottom of shopping cart but unable to carry    Time 3    Period Months    Status On-going      PT LONG TERM GOAL #2   Title 6MWT at least 1300 ft    Baseline 1370 feet 09/18/2020    Time 3    Period Weeks    Status Achieved      PT LONG TERM GOAL #3   Title 5TSTS 12s or less    Baseline 14 sec    Time 3    Period Months    Status On-going      PT LONG TERM GOAL #4   Title pt will use stairs at work    Baseline still mostly uses elevator    Time 3    Period Weeks    Status On-going      PT LONG TERM GOAL #5   Title pt will be independent in safe, challenging core strength program    Baseline updates ongoing    Time 3    Period Months    Status On-going                 Plan - 10/04/20 1808    Clinical Impression Statement Continued progression of exercises for general strengthening, core work and improving functional activity tolerance-challenged as previously with crate lift exercise (13 lbs. including unweighted crate weight) but overall continues to demonstrate improving activity tolerance/endurance and LE strength as evidenced by tolerance for increased resistance with exercises. Progess with LTGs still ongoing.    Personal Factors and Comorbidities Time since onset of injury/illness/exacerbation;Comorbidity 3+    Comorbidities anemia, low hemoglobin, h/o multiple surgeries & abdominal infection    Examination-Activity Limitations Reach Overhead;Bend;Sit;Sleep;Carry;Squat;Stairs;Stand;Lift;Locomotion Level    Examination-Participation Restrictions Occupation;Cleaning;Shop;Laundry;Meal Prep    Stability/Clinical Decision Making Unstable/Unpredictable    Clinical Decision Making High    PT Frequency 2x / week    PT Duration --  3 months   PT  Treatment/Interventions ADLs/Self Care Home Management;Cryotherapy;Electrical Stimulation;Gait training;Moist Heat;Stair training;Functional mobility training;Therapeutic activities;Therapeutic exercise;Aquatic Therapy;Neuromuscular re-education;Manual techniques;Patient/family education;Passive range of motion;Dry needling;Taping    PT Next Visit Plan Continue/progress reformer, continue strengthening and endurance, functional activity progression    PT Home Exercise Plan continue with light upper body weights, walking, [id number]  [id number]    Consulted and Agree with Plan of Care Patient           Patient will benefit from skilled therapeutic intervention in order to improve the following deficits and impairments:  Difficulty walking,Increased muscle spasms,Impaired UE functional use,Decreased endurance,Cardiopulmonary status limiting activity,Decreased activity tolerance,Pain,Decreased strength  Visit Diagnosis: Difficulty in walking, not elsewhere classified  Muscle weakness (generalized)     Problem List Patient Active Problem List   Diagnosis Date Noted   Abnormal findings on flexible sigmoidoscopy 02/22/2020   Abnormal endoscopy of upper gastrointestinal tract 02/22/2020   History of small bowel obstruction 02/22/2020   Gastritis without bleeding 02/22/2020   Endometriosis 02/22/2020   Rectal bleeding 02/22/2020   Chronic pelvic pain in female 02/22/2020   Chronic generalized abdominal pain 02/22/2020   TOA (tubo-ovarian abscess) 07/24/2019   Tubo-ovarian abscess 07/20/2019   Abdominal pain 07/20/2019   Physical deconditioning 05/14/2019   PID (pelvic inflammatory disease) 05/14/2019   Small bowel obstruction ([facility])    Pelvic mass 05/02/2019   Elevated glucose 11/29/2018   Class 1 obesity with serious comorbidity and body mass index (BMI) of 30.0 to 30.9 in adult 11/29/2018    Beaulah Dinning, PT, DPT 10/04/20 [facility] [place], [place], [address] Phone: [phone]   Fax:  [phone]  Name: Gwendolyn Nguyen MRN: [id number] Date of Birth: 1985/11/24

## 2020-10-09 ENCOUNTER — Encounter: Payer: Self-pay | Admitting: Physical Therapy

## 2020-10-09 ENCOUNTER — Ambulatory Visit: Payer: BC Managed Care – PPO | Admitting: Physical Therapy

## 2020-10-09 ENCOUNTER — Other Ambulatory Visit: Payer: Self-pay

## 2020-10-09 DIAGNOSIS — R262 Difficulty in walking, not elsewhere classified: Secondary | ICD-10-CM

## 2020-10-09 DIAGNOSIS — M6281 Muscle weakness (generalized): Secondary | ICD-10-CM

## 2020-10-09 NOTE — Therapy (Signed)
[place], [place], [address] Phone: [phone]   Fax:  [phone]  Physical Therapy Treatment  Patient Details  Name: Pecola Haxton MRN: [id number] Date of Birth: 10-09-1986 Referring Provider (PT): Amalia Hailey, MD   Encounter Date: 10/09/2020   PT End of Session - 10/09/20 1706    Visit Number 14    Number of Visits 30    Date for PT Re-Evaluation 10/25/20    Authorization Type BCBS    PT Start Time 1706    PT Stop Time 1751    PT Time Calculation (min) 45 min    Activity Tolerance Patient tolerated treatment well    Behavior During Therapy [facility] for tasks assessed/performed           Past Medical History:  Diagnosis Date  . Anemia   . Back pain   . Constipation   . Endometriosis   . IBS (irritable bowel syndrome)   . Low hemoglobin   . Multiple food allergies   . PID (acute pelvic inflammatory disease) 07/6268   complicated by tuboovarian abscess    Past Surgical History:  Procedure Laterality Date  . IR RADIOLOGIST EVAL & MGMT  05/19/2019  . IR RADIOLOGIST EVAL & MGMT  05/24/2019  . LAPAROSCOPIC LYSIS OF ADHESIONS N/A 07/24/2019   Procedure: Laparoscopic Lysis Of Adhesions, Drainage of Pelvic Abscess;  Surgeon: Servando Salina, MD;  Location: [place];  Service: Gynecology;  Laterality: N/A;  . LAPAROSCOPIC LYSIS OF ADHESIONS N/A 07/24/2019   Procedure: Laparoscopic Lysis Of Adhesions and Assist With Drainage of Pelvic Abscess;  Surgeon: Georganna Skeans, MD;  Location: [place];  Service: General;  Laterality: N/A;  . LAPAROSCOPY N/A 07/24/2019   Procedure: LAPAROSCOPY DIAGNOSTIC;  Surgeon: Servando Salina, MD;  Location: [place];  Service: Gynecology;  Laterality: N/A;  . TOTAL ABDOMINAL HYSTERECTOMY W/ BILATERAL SALPINGOOPHORECTOMY  04/13/2020   full ectocervix not removed  - NEEDS PAP (done at [facility] for endometriosis)    There were no vitals filed for this visit.   Subjective  Assessment - 10/09/20 1710    Subjective Pt. off from work for holiday break. No new complaints/concerns otherwise this PM.    Currently in Pain? No/denies              [facility] PT Assessment - 10/09/20 0001      Transfers   Five time sit to stand comments  10 seconds                         [facility] Adult PT Treatment/Exercise - 10/09/20 0001      Exercises   Exercises Lumbar      Pilates   Pilates Reformer single leg squat with opposite hip extension with foot in strap 2x10 ea. bilat. with 2 red cords      Lumbar Exercises: Machines for Strengthening   Cybex Leg Press 55 lbs. 2x10 bilat. LE    Elliptical ramp 5 L1 x 5 min      Lumbar Exercises: Quadruped   Straight Leg Raise 15 reps    Straight Leg Raises Limitations alternating bilat.      Knee/Hip Exercises: Standing   Forward Step Up Left;Right;2 sets;10 reps;Hand Hold: 0;Step Height: 6"    Forward Step Up Limitations holding 7 lb. DB ea. UE bilat.    Other Standing Knee Exercises Monster walk with green band 20 feet x 4(band proximal to knees)  Knee/Hip Exercises: Supine   Bridges with Clamshell AROM;Strengthening;Both;2 sets;10 reps   green band     Shoulder Exercises: Standing   Row Limitations TRX row 2x10    Other Standing Exercises Lift/move crate (8 lb. unweighted with 5 lb. weight added) from 8 in. step to counter x 10 reps, modified pall off press with blue band with "soup stirrer" x 10 ea. way bilat. clockwise/counterclockwise    Other Standing Exercises push up to 65 cm P-ball at counter with alternating arm raise/trunk rotation ea. rep following elbow extension, [employer] "lawnmower" pull with trunk rotation 3 lbs. 2x10 ea. bilat.                  PT Education - 10/09/20 1757    Education Details progress, POC    Person(s) Educated Patient    Methods Explanation    Comprehension Verbalized understanding            PT Short Term Goals - 07/25/20 1741      PT SHORT TERM  GOAL #1   Title pt will be independent in HEP as it has been established in the short term    Baseline will progress as appropriate    Time 6    Period Weeks    Status New    Target Date 09/05/20      PT SHORT TERM GOAL #2   Title pt will demo proper core engagement in supine, seated and standing    Baseline unable properly at eval    Time 6    Period Weeks    Status New    Target Date 09/05/20      PT SHORT TERM GOAL #3   Title pt will be able to demo good form to squat to grab small objects from floor    Baseline compensates at eval due to difficulty/pain    Time 6    Period Weeks    Status New    Target Date 09/05/20             PT Long Term Goals - 10/09/20 1708      PT LONG TERM GOAL #1   Title pt will be able to lift case of water from [employer]    Baseline can briefly lift to put on bottom of shopping cart but unable to carry    Time 3    Period Months    Status On-going      PT LONG TERM GOAL #2   Title 6MWT at least 1300 ft    Baseline 1370 feet 09/18/2020    Time 3    Period Months    Status Achieved      PT LONG TERM GOAL #3   Title 5TSTS 12s or less    Baseline 10 sec 10/09/20    Time 3    Period Months    Status On-going      PT LONG TERM GOAL #4   Title pt will use stairs at work    Baseline still needs to use elevator when fatigued    Time 3    Period Months    Status On-going      PT LONG TERM GOAL #5   Title pt will be independent in safe, challenging core strength program    Baseline updates ongoing    Time 3    Period Months    Status On-going                   Plan - 10/09/20 1755    Clinical Impression Statement Rechecked 5 times sit<>stand today with goal of time 12 sec or less met (10 sec today). Still with weakness impacting ability for lifting activities in particular but pt. has demonstrated steady progress for improvements in strength and functional activity tolerance from previous status.    Personal Factors and  Comorbidities Time since onset of injury/illness/exacerbation;Comorbidity 3+    Comorbidities anemia, low hemoglobin, h/o multiple surgeries & abdominal infection    Examination-Activity Limitations Reach Overhead;Bend;Sit;Sleep;Carry;Squat;Stairs;Stand;Lift;Locomotion Level    Examination-Participation Restrictions Occupation;Cleaning;Shop;Laundry;Meal Prep    Stability/Clinical Decision Making Unstable/Unpredictable    Clinical Decision Making High    Rehab Potential Good    PT Frequency 2x / week    PT Duration --   3 months   PT Treatment/Interventions ADLs/Self Care Home Management;Cryotherapy;Electrical Stimulation;Gait training;Moist Heat;Stair training;Functional mobility training;Therapeutic activities;Therapeutic exercise;Aquatic Therapy;Neuromuscular re-education;Manual techniques;Patient/family education;Passive range of motion;Dry needling;Taping    PT Next Visit Plan Continue/progress reformer, continue strengthening and endurance, functional activity progression    PT Home Exercise Plan continue with light upper body weights, walking, [id number]  [id number]    Consulted and Agree with Plan of Care Patient           Patient will benefit from skilled therapeutic intervention in order to improve the following deficits and impairments:  Difficulty walking,Increased muscle spasms,Impaired UE functional use,Decreased endurance,Cardiopulmonary status limiting activity,Decreased activity tolerance,Pain,Decreased strength  Visit Diagnosis: Difficulty in walking, not elsewhere classified  Muscle weakness (generalized)     Problem List Patient Active Problem List   Diagnosis Date Noted  . Abnormal findings on flexible sigmoidoscopy 02/22/2020  . Abnormal endoscopy of upper gastrointestinal tract 02/22/2020  . History of small bowel obstruction 02/22/2020  . Gastritis without bleeding 02/22/2020  . Endometriosis 02/22/2020  . Rectal bleeding 02/22/2020  . Chronic pelvic  pain in female 02/22/2020  . Chronic generalized abdominal pain 02/22/2020  . TOA (tubo-ovarian abscess) 07/24/2019  . Tubo-ovarian abscess 07/20/2019  . Abdominal pain 07/20/2019  . Physical deconditioning 05/14/2019  . PID (pelvic inflammatory disease) 05/14/2019  . Small bowel obstruction (HCC)   . Pelvic mass 05/02/2019  . Elevated glucose 11/29/2018  . Class 1 obesity with serious comorbidity and body mass index (BMI) of 30.0 to 30.9 in adult 11/29/2018     , PT, DPT 10/09/20 5:58 PM  [facility] [address] [place], [place], [address] Phone: [phone]   Fax:  [phone]  Name: Cookie Cuthbert MRN: [id number] Date of Birth: 06/01/1986   

## 2020-10-11 ENCOUNTER — Ambulatory Visit: Payer: BC Managed Care – PPO | Admitting: Physical Therapy

## 2020-10-16 ENCOUNTER — Other Ambulatory Visit: Payer: Self-pay

## 2020-10-16 ENCOUNTER — Ambulatory Visit: Payer: BC Managed Care – PPO | Admitting: Physical Therapy

## 2020-10-16 ENCOUNTER — Encounter: Payer: Self-pay | Admitting: Physical Therapy

## 2020-10-16 DIAGNOSIS — R262 Difficulty in walking, not elsewhere classified: Secondary | ICD-10-CM

## 2020-10-16 DIAGNOSIS — M6281 Muscle weakness (generalized): Secondary | ICD-10-CM

## 2020-10-16 NOTE — Therapy (Signed)
[place], [place], [address] Phone: [phone]   Fax:  [phone]  Physical Therapy Treatment  Patient Details  Name: Gwendolyn Nguyen MRN: [id number] Date of Birth: [birth year] Referring Provider (PT): Amalia Hailey, MD   Encounter Date: 10/16/2020   PT End of Session - 10/16/20 1719    Visit Number 15    Number of Visits 30    Date for PT Re-Evaluation 10/25/20    Authorization Type BCBS    PT Start Time 1717    PT Stop Time 1755    PT Time Calculation (min) 38 min    Activity Tolerance Patient tolerated treatment well    Behavior During Therapy [facility] for tasks assessed/performed           Past Medical History:  Diagnosis Date  . Anemia   . Back pain   . Constipation   . Endometriosis   . IBS (irritable bowel syndrome)   . Low hemoglobin   . Multiple food allergies   . PID (acute pelvic inflammatory disease) 99991111   complicated by tuboovarian abscess    Past Surgical History:  Procedure Laterality Date  . IR RADIOLOGIST EVAL & MGMT  05/19/2019  . IR RADIOLOGIST EVAL & MGMT  05/24/2019  . LAPAROSCOPIC LYSIS OF ADHESIONS N/A 07/24/2019   Procedure: Laparoscopic Lysis Of Adhesions, Drainage of Pelvic Abscess;  Surgeon: Servando Salina, MD;  Location: [place];  Service: Gynecology;  Laterality: N/A;  . LAPAROSCOPIC LYSIS OF ADHESIONS N/A 07/24/2019   Procedure: Laparoscopic Lysis Of Adhesions and Assist With Drainage of Pelvic Abscess;  Surgeon: Georganna Skeans, MD;  Location: [place];  Service: General;  Laterality: N/A;  . LAPAROSCOPY N/A 07/24/2019   Procedure: LAPAROSCOPY DIAGNOSTIC;  Surgeon: Servando Salina, MD;  Location: [place];  Service: Gynecology;  Laterality: N/A;  . TOTAL ABDOMINAL HYSTERECTOMY W/ BILATERAL SALPINGOOPHORECTOMY  04/13/2020   full ectocervix not removed  - NEEDS PAP (done at [facility] for endometriosis)    There were no vitals filed for this visit.   Subjective  Assessment - 10/16/20 1720    Subjective Feel some pulling from surgery site when stretching. Prefer to work upper body today.    Currently in Pain? No/denies                             [facility] Adult PT Treatment/Exercise - 10/16/20 0001      Lumbar Exercises: Seated   Other Seated Lumbar Exercises seated roll backs pilates springboard      Lumbar Exercises: Quadruped   Other Quadruped Lumbar Exercises knees/hands mini push ups 5x5      Shoulder Exercises: Standing   Row Limitations squat row with 2x10lb kettle bells    Other Standing Exercises carrying 2x10 lb kettle bells 3 laps    Other Standing Exercises pilates springboard yellow springs: kneeling squat + row; half kneel biceps curls; kneeling triceps press      Shoulder Exercises: ROM/Strengthening   UBE (Upper Arm Bike) 3/3 L3                    PT Short Term Goals - 07/25/20 1741      PT SHORT TERM GOAL #1   Title pt will be independent in HEP as it has been established in the short term    Baseline will progress as appropriate    Time 6    Period Weeks  Status New    Target Date 09/05/20      PT SHORT TERM GOAL #2   Title pt will demo proper core engagement in supine, seated and standing    Baseline unable properly at eval    Time 6    Period Weeks    Status New    Target Date 09/05/20      PT SHORT TERM GOAL #3   Title pt will be able to demo good form to squat to grab small objects from floor    Baseline compensates at eval due to difficulty/pain    Time 6    Period Weeks    Status New    Target Date 09/05/20             PT Long Term Goals - 10/09/20 1708      PT LONG TERM GOAL #1   Title pt will be able to lift case of water from [employer] can briefly lift to put on bottom of shopping cart but unable to carry    Time 3    Period Months    Status On-going      PT LONG TERM GOAL #2   Title 6MWT at least 1300 ft    Baseline 1370 feet 09/18/2020    Time 3     Period Months    Status Achieved      PT LONG TERM GOAL #3   Title 5TSTS 12s or less    Baseline 10 sec 10/09/20    Time 3    Period Months    Status On-going      PT LONG TERM GOAL #4   Title pt will use stairs at work    Baseline still needs to use elevator when fatigued    Time 3    Period Months    Status On-going      PT LONG TERM GOAL #5   Title pt will be independent in safe, challenging core strength program    Baseline updates ongoing    Time 3    Period Months    Status On-going                 Plan - 10/16/20 1754    Clinical Impression Statement Pt tolerated weight lifting well- fatigue wihtout pain as expected. Still unable to lift [employer] size case of water but she is able to lift one from the regular grocery store. She would like to d/c at her next visit due to cost of PT in the new year.    PT Treatment/Interventions ADLs/Self Care Home Management;Cryotherapy;Electrical Stimulation;Gait training;Moist Heat;Stair training;Functional mobility training;Therapeutic activities;Therapeutic exercise;Aquatic Therapy;Neuromuscular re-education;Manual techniques;Patient/family education;Passive range of motion;Dry needling;Taping    PT Next Visit Plan physioball & discharge    PT Home Exercise Plan continue with light upper body weights, walking, [id number]  [id number]    Consulted and Agree with Plan of Care Patient           Patient will benefit from skilled therapeutic intervention in order to improve the following deficits and impairments:  Difficulty walking,Increased muscle spasms,Impaired UE functional use,Decreased endurance,Cardiopulmonary status limiting activity,Decreased activity tolerance,Pain,Decreased strength  Visit Diagnosis: Difficulty in walking, not elsewhere classified  Muscle weakness (generalized)     Problem List Patient Active Problem List   Diagnosis Date Noted  . Abnormal findings on flexible sigmoidoscopy 02/22/2020   . Abnormal endoscopy of upper gastrointestinal tract 02/22/2020  . History of small bowel obstruction 02/22/2020  .  Gastritis without bleeding 02/22/2020  . Endometriosis 02/22/2020  . Rectal bleeding 02/22/2020  . Chronic pelvic pain in female 02/22/2020  . Chronic generalized abdominal pain 02/22/2020  . TOA (tubo-ovarian abscess) 07/24/2019  . Tubo-ovarian abscess 07/20/2019  . Abdominal pain 07/20/2019  . Physical deconditioning 05/14/2019  . PID (pelvic inflammatory disease) 05/14/2019  . Small bowel obstruction (HCC)   . Pelvic mass 05/02/2019  . Elevated glucose 11/29/2018  . Class 1 obesity with serious comorbidity and body mass index (BMI) of 30.0 to 30.9 in adult 11/29/2018    Cami Delawder C. Crystina Borrayo PT, DPT 10/16/20 7:34 PM   [facility] [address] [place], [place], [address] Phone: [phone]   Fax:  [phone]  Name: Gwendolyn Nguyen MRN: [id number] Date of Birth: 18-Nov-1985

## 2020-10-17 ENCOUNTER — Encounter: Payer: Self-pay | Admitting: Physical Therapy

## 2020-10-18 ENCOUNTER — Ambulatory Visit: Payer: BC Managed Care – PPO

## 2020-10-18 ENCOUNTER — Other Ambulatory Visit: Payer: Self-pay

## 2020-10-18 DIAGNOSIS — R262 Difficulty in walking, not elsewhere classified: Secondary | ICD-10-CM

## 2020-10-18 DIAGNOSIS — M6281 Muscle weakness (generalized): Secondary | ICD-10-CM

## 2020-10-19 NOTE — Therapy (Signed)
[place], [place], [address] Phone: [phone]   Fax:  [phone]  Physical Therapy Treatment/Discharge  Patient Details  Name: Gwendolyn Nguyen MRN: [id number] Date of Birth: 1986/06/07 Referring Provider (PT): Amalia Hailey, MD   Encounter Date: 10/18/2020   PT End of Session - 10/18/20 1746    Visit Number 16    Number of Visits 30    Date for PT Re-Evaluation 10/25/20    Authorization Type BCBS    PT Start Time 1719    PT Stop Time 1800    PT Time Calculation (min) 41 min    Activity Tolerance Patient tolerated treatment well    Behavior During Therapy [facility] for tasks assessed/performed           Past Medical History:  Diagnosis Date  . Anemia   . Back pain   . Constipation   . Endometriosis   . IBS (irritable bowel syndrome)   . Low hemoglobin   . Multiple food allergies   . PID (acute pelvic inflammatory disease) 10/270   complicated by tuboovarian abscess    Past Surgical History:  Procedure Laterality Date  . IR RADIOLOGIST EVAL & MGMT  05/19/2019  . IR RADIOLOGIST EVAL & MGMT  05/24/2019  . LAPAROSCOPIC LYSIS OF ADHESIONS N/A 07/24/2019   Procedure: Laparoscopic Lysis Of Adhesions, Drainage of Pelvic Abscess;  Surgeon: Servando Salina, MD;  Location: [place];  Service: Gynecology;  Laterality: N/A;  . LAPAROSCOPIC LYSIS OF ADHESIONS N/A 07/24/2019   Procedure: Laparoscopic Lysis Of Adhesions and Assist With Drainage of Pelvic Abscess;  Surgeon: Georganna Skeans, MD;  Location: [place];  Service: General;  Laterality: N/A;  . LAPAROSCOPY N/A 07/24/2019   Procedure: LAPAROSCOPY DIAGNOSTIC;  Surgeon: Servando Salina, MD;  Location: [place];  Service: Gynecology;  Laterality: N/A;  . TOTAL ABDOMINAL HYSTERECTOMY W/ BILATERAL SALPINGOOPHORECTOMY  04/13/2020   full ectocervix not removed  - NEEDS PAP (done at [facility] for endometriosis)    There were no vitals filed for this visit.    Subjective Assessment - 10/18/20 1719    Subjective Patient reports subjective overall improvement of 60-70%, but is requesting discharge tonight due to financial reasons. She denies any pain, but reports soreness along abdomen and her back. Patient reports continued difficulty with reaching overhead and lifting heavier items at the store. She feels confident in continuing to progress with her HEP independently.    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 No/denies              [facility] PT Assessment - 10/19/20 0001      Observation/Other Assessments   Focus on Therapeutic Outcomes (FOTO)  57% function      Special Tests   Other special tests 5TSTS 7.9      Transfers   Five time sit to stand comments  7.9      6 minute walk test results    Aerobic Endurance Distance Walked 1476                         [facility] Adult PT Treatment/Exercise - 10/19/20 0001      Self-Care   Self-Care Other Self-Care Comments    Other Self-Care Comments  see patient education      Lumbar Exercises: Seated   Other Seated Lumbar Exercises pelvic tilts on  stability ball 2 x 10    Other Seated Lumbar Exercises seated march on stability ball 2 x 10      Lumbar Exercises: Supine   Bridge Limitations 2 x 10 on stability ball    Other Supine Lumbar Exercises lumbar rotation on stability ball 2 x 10                  PT Education - 10/18/20 1742    Education Details D/C education. overall progress. updated HEP    Person(s) Educated Patient    Methods Explanation;Demonstration;Handout    Comprehension Verbalized understanding;Returned demonstration            PT Short Term Goals - 10/18/20 1746      PT SHORT TERM GOAL #1   Title pt will be independent in HEP as it has been established in the short term    Baseline will progress as appropriate    Time 6    Period Weeks    Status  Achieved    Target Date 09/05/20      PT SHORT TERM GOAL #2   Title pt will demo proper core engagement in supine, seated and standing    Baseline unable properly at eval    Time 6    Period Weeks    Status Achieved    Target Date 09/05/20      PT SHORT TERM GOAL #3   Title pt will be able to demo good form to squat to grab small objects from floor    Baseline able to demo appropriate squat 10/18/20    Time 6    Period Weeks    Status Achieved    Target Date 09/05/20             PT Long Term Goals - 10/18/20 1746      PT LONG TERM GOAL #1   Title pt will be able to lift case of water from [employer] can briefly lift to put on bottom of shopping cart but unable to carry    Time 3    Period Months    Status On-going      PT LONG TERM GOAL #2   Title 6MWT at least 1300 ft    Baseline 1476 10/18/20    Time 3    Period Months    Status Achieved      PT LONG TERM GOAL #3   Title 5TSTS 12s or less    Baseline 7.9 sec 10/18/20    Time 3    Period Months    Status Achieved      PT LONG TERM GOAL #4   Title pt will use stairs at work    Baseline half and half between stairs and elevator    Time 3    Period Months    Status On-going      PT LONG TERM GOAL #5   Title pt will be independent in safe, challenging core strength program    Baseline HEP updated to include stability ball core exercises    Time 3    Period Months    Status Achieved                 Plan - 10/18/20 1736    Clinical Impression Statement Patient has made notable functional gains since the start of care with significant improvements in 6-MWT, 5xSTS, and FOTO outcome mesaure. She reports continued difficulty with carrying and lifting activity as well  as stair negotiation. While she has lingerng functional deficits she is requesting D/C at this time secondary to financial concerns. She demonstrates independence in advanced home program issued at today's session.    Personal Factors  and Comorbidities Time since onset of injury/illness/exacerbation;Comorbidity 3+    Comorbidities anemia, low hemoglobin, h/o multiple surgeries & abdominal infection    Examination-Activity Limitations Reach Overhead;Bend;Sleep;Carry;Squat;Stairs;Stand;Lift;Locomotion Level    Examination-Participation Restrictions Occupation;Cleaning;Shop;Laundry;Meal Prep    PT Treatment/Interventions ADLs/Self Care Home Management;Cryotherapy;Electrical Stimulation;Gait training;Moist Heat;Stair training;Functional mobility training;Therapeutic activities;Therapeutic exercise;Aquatic Therapy;Neuromuscular re-education;Manual techniques;Patient/family education;Passive range of motion;Dry needling;Taping    PT Next Visit Plan --    PT Home Exercise Plan continue with light upper body weights, walking, [id number]  [id number]    Consulted and Agree with Plan of Care Patient           Patient will benefit from skilled therapeutic intervention in order to improve the following deficits and impairments:  Difficulty walking,Increased muscle spasms,Impaired UE functional use,Decreased endurance,Cardiopulmonary status limiting activity,Decreased activity tolerance,Pain,Decreased strength  Visit Diagnosis: Difficulty in walking, not elsewhere classified  Muscle weakness (generalized)     Problem List Patient Active Problem List   Diagnosis Date Noted  . Abnormal findings on flexible sigmoidoscopy 02/22/2020  . Abnormal endoscopy of upper gastrointestinal tract 02/22/2020  . History of small bowel obstruction 02/22/2020  . Gastritis without bleeding 02/22/2020  . Endometriosis 02/22/2020  . Rectal bleeding 02/22/2020  . Chronic pelvic pain in female 02/22/2020  . Chronic generalized abdominal pain 02/22/2020  . TOA (tubo-ovarian abscess) 07/24/2019  . Tubo-ovarian abscess 07/20/2019  . Abdominal pain 07/20/2019  . Physical deconditioning 05/14/2019  . PID (pelvic inflammatory disease) 05/14/2019  .  Small bowel obstruction ([place])   . Pelvic mass 05/02/2019  . Elevated glucose 11/29/2018  . Class 1 obesity with serious comorbidity and body mass index (BMI) of 30.0 to 30.9 in adult 11/29/2018   PHYSICAL THERAPY DISCHARGE SUMMARY  Visits from Start of Care: 16  Current functional level related to goals / functional outcomes: See above   Remaining deficits: See above   Education / Equipment: See above  Plan: Patient agrees to discharge.  Patient goals were partially met. Patient is being discharged due to financial reasons.  ?????         Gwendolyn Grant, PT, DPT, ATC 10/19/20 7:31 AM  [facility] [address] [place], [place], [address] Phone: [phone]   Fax:  [phone]  Name: Gwendolyn Nguyen MRN: [id number] Date of Birth: Mar 16, 1986

## 2020-11-01 ENCOUNTER — Encounter: Payer: Self-pay | Admitting: Registered Nurse

## 2020-11-01 NOTE — Progress Notes (Signed)
Acute Office Visit  Subjective:    Patient ID: Gwendolyn Nguyen, female    DOB: May 31, 1986, 35 y.o.   MRN: [id number]  Chief Complaint  Patient presents with  . Urinary Tract Infection    patient states she has been having some symptoms of an UTi for about a week. Per patient she wants to get her thyroids checked per OBGYN.    HPI Patient is in today for UTI and thyroid check  UTI: sxs for about 1 week Dysuria, urgency, frequency, some lower back pain No systemic symptoms or symptoms concerning for sepsis or pyelo Has had frequent UTIs for much of the past 1-2 years.  TSH: Suggested that she get this checked from her obgyn  She has been having some symptoms concerning for hypothyroidism   Otherwise feeling well  Past Medical History:  Diagnosis Date  . Anemia   . Back pain   . Constipation   . Endometriosis   . IBS (irritable bowel syndrome)   . Low hemoglobin   . Multiple food allergies   . PID (acute pelvic inflammatory disease) 37/6283   complicated by tuboovarian abscess    Past Surgical History:  Procedure Laterality Date  . IR RADIOLOGIST EVAL & MGMT  05/19/2019  . IR RADIOLOGIST EVAL & MGMT  05/24/2019  . LAPAROSCOPIC LYSIS OF ADHESIONS N/A 07/24/2019   Procedure: Laparoscopic Lysis Of Adhesions, Drainage of Pelvic Abscess;  Surgeon: Servando Salina, MD;  Location: [place];  Service: Gynecology;  Laterality: N/A;  . LAPAROSCOPIC LYSIS OF ADHESIONS N/A 07/24/2019   Procedure: Laparoscopic Lysis Of Adhesions and Assist With Drainage of Pelvic Abscess;  Surgeon: Georganna Skeans, MD;  Location: [place];  Service: General;  Laterality: N/A;  . LAPAROSCOPY N/A 07/24/2019   Procedure: LAPAROSCOPY DIAGNOSTIC;  Surgeon: Servando Salina, MD;  Location: [place];  Service: Gynecology;  Laterality: N/A;  . TOTAL ABDOMINAL HYSTERECTOMY W/ BILATERAL SALPINGOOPHORECTOMY  04/13/2020   full ectocervix not removed  - NEEDS PAP (done at [facility] for endometriosis)    Family History   Problem Relation Age of Onset  . Hyperlipidemia Mother   . Hypertension Mother   . Obesity Mother   . Hyperlipidemia Father   . Hypertension Father   . Obesity Father   . Diabetes Maternal Grandmother   . Colon cancer Neg Hx   . Esophageal cancer Neg Hx   . Inflammatory bowel disease Neg Hx   . Liver disease Neg Hx   . Pancreatic cancer Neg Hx   . Rectal cancer Neg Hx   . Stomach cancer Neg Hx     Social History   Socioeconomic History  . Marital status: Single    Spouse name: Not on file  . Number of children: Not on file  . Years of education: Not on file  . Highest education level: Not on file  Occupational History  . Occupation: Social worker  Tobacco Use  . Smoking status: Never Smoker  . Smokeless tobacco: Never Used  Vaping Use  . Vaping Use: Never used  Substance and Sexual Activity  . Alcohol use: Yes    Comment: occassional  . Drug use: Never  . Sexual activity: Not on file  Other Topics Concern  . Not on file  Social History Narrative  . Not on file   Social Determinants of Health   Financial Resource Strain: Not on file  Food Insecurity: Not on file  Transportation Needs: Not on file  Physical Activity: Not on file  Stress: Not  on file  Social Connections: Not on file  Intimate Partner Violence: Not on file    Outpatient Medications Prior to Visit  Medication Sig Dispense Refill  . cyclobenzaprine (FLEXERIL) 5 MG tablet Take 5 mg by mouth 3 (three) times daily as needed for muscle spasms.    . clonazePAM (KLONOPIN) 0.5 MG tablet Take 1 tablet (0.5 mg total) by mouth at bedtime. For GI symptoms. (Patient not taking: Reported on 07/25/2020) 30 tablet 0  . DULoxetine (CYMBALTA) 20 MG capsule Take 20 mg by mouth daily. (Patient not taking: Reported on 07/25/2020)    . esomeprazole (NEXIUM) 40 MG capsule TAKE 1 CAPSULE (40 MG TOTAL) BY MOUTH DAILY AT 12 NOON. (Patient not taking: Reported on 07/25/2020) 90 capsule 1  . gabapentin (NEURONTIN) 100 MG capsule  Take by mouth 3 (three) times daily.     Marland Kitchen letrozole (FEMARA) 2.5 MG tablet Take 2.5 mg by mouth daily. (Patient not taking: Reported on 07/25/2020)    . naproxen sodium (ALEVE) 220 MG tablet Take 220 mg by mouth as needed. (Patient not taking: Reported on 08/13/2020)    . norethindrone (AYGESTIN) 5 MG tablet Take 5 mg by mouth daily. (Patient not taking: Reported on 07/25/2020)    . predniSONE (DELTASONE) 50 MG tablet Take as directed prior to CT scan. (Patient not taking: Reported on 07/25/2020) 3 tablet 0  . traMADol (ULTRAM) 50 MG tablet Take by mouth every 6 (six) hours as needed. (Patient not taking: Reported on 07/25/2020)     No facility-administered medications prior to visit.    Allergies  Allergen Reactions  . Gadolinium Derivatives Hives, Itching and Nausea And Vomiting    Pt vomited immediately during injection.  15 minutes later, pt developed hives and itching.   . Shellfish Allergy Hives  . Sulfa Antibiotics Other (See Comments)    Patient was told to NOT TAKE THIS  . Iohexol Hives and Rash    08-2018, rash and hives, needs pre meds S/W PATIENT STATED REACTION FROM CT CONTRAST 11/19    Review of Systems  Constitutional: Negative.   HENT: Negative.   Eyes: Negative.   Respiratory: Negative.   Cardiovascular: Negative.   Gastrointestinal: Negative.   Genitourinary: Negative.   Musculoskeletal: Negative.   Skin: Negative.   Neurological: Negative.   Psychiatric/Behavioral: Negative.   All other systems reviewed and are negative.      Objective:    Physical Exam Vitals and nursing note reviewed.  Constitutional:      General: She is not in acute distress.    Appearance: Normal appearance. She is not ill-appearing, toxic-appearing or diaphoretic.  Cardiovascular:     Rate and Rhythm: Normal rate and regular rhythm.     Pulses: Normal pulses.     Heart sounds: Normal heart sounds. No murmur heard. No friction rub. No gallop.   Pulmonary:     Effort: Pulmonary  effort is normal. No respiratory distress.     Breath sounds: Normal breath sounds. No stridor. No wheezing, rhonchi or rales.  Chest:     Chest wall: No tenderness.  Skin:    General: Skin is warm and dry.     Capillary Refill: Capillary refill takes less than 2 seconds.  Neurological:     General: No focal deficit present.     Mental Status: She is alert and oriented to person, place, and time. Mental status is at baseline.  Psychiatric:        Mood and Affect: Mood normal.  Behavior: Behavior normal.        Thought Content: Thought content normal.        Judgment: Judgment normal.     BP (!) 135/93   Pulse 88   Temp 98 F (36.7 C) (Temporal)   Resp 18   Ht 5\' 6"  (1.676 m)   Wt 189 lb (85.7 kg)   SpO2 100%   BMI 30.51 kg/m  Wt Readings from Last 3 Encounters:  08/31/20 190 lb 12.8 oz (86.5 kg)  08/13/20 189 lb (85.7 kg)  02/21/20 182 lb (82.6 kg)    Health Maintenance Due  Topic Date Due  . COVID-19 Vaccine (1) Never done  . TETANUS/TDAP  Never done  . PAP SMEAR-Modifier  Never done    There are no preventive care reminders to display for this patient.   Lab Results  Component Value Date   TSH 2.230 08/13/2020   Lab Results  Component Value Date   WBC 11.2 (H) 02/21/2020   HGB 13.2 02/21/2020   HCT 40.5 02/21/2020   MCV 83.1 02/21/2020   PLT 404.0 (H) 02/21/2020   Lab Results  Component Value Date   NA 133 (L) 02/21/2020   K 4.1 02/21/2020   CO2 29 02/21/2020   GLUCOSE 85 02/21/2020   BUN 10 02/21/2020   CREATININE 1.03 02/21/2020   BILITOT 0.4 02/21/2020   ALKPHOS 78 02/21/2020   AST 116 (H) 02/21/2020   ALT 326 (H) 02/21/2020   PROT 8.1 02/21/2020   ALBUMIN 3.9 02/21/2020   CALCIUM 9.2 02/21/2020   ANIONGAP 13 08/03/2019   GFR 75.15 02/21/2020   Lab Results  Component Value Date   CHOL 150 11/25/2018   Lab Results  Component Value Date   HDL 56 11/25/2018   Lab Results  Component Value Date   LDLCALC 86 11/25/2018   Lab  Results  Component Value Date   TRIG 41 11/25/2018   No results found for: CHOLHDL No results found for: HGBA1C     Assessment & Plan:   Problem List Items Addressed This Visit   None   Visit Diagnoses    Urinary tract infection without hematuria, site unspecified    -  Primary   Relevant Orders   Urine Culture (Completed)   POCT urinalysis dipstick (Completed)   Other fatigue       Relevant Orders   Thyroid Panel With TSH (Completed)   History of hysterectomy       Relevant Orders   Estrogens, Total (Completed)       Meds ordered this encounter  Medications  . nitrofurantoin, macrocrystal-monohydrate, (MACROBID) 100 MG capsule    Sig: Take 1 capsule (100 mg total) by mouth 2 (two) times daily for 5 days.    Dispense:  10 capsule    Refill:  0    Order Specific Question:   Supervising Provider    Answer:   Carlota Raspberry, JEFFREY R [[id number]]   PLAN  Will check estrogens and tsh  poct ua suspicious for UTI  macrobid po bid for 5 days  Patient encouraged to call clinic with any questions, comments, or concerns.   Maximiano Coss, NP

## 2020-11-02 ENCOUNTER — Other Ambulatory Visit: Payer: Self-pay | Admitting: Gastroenterology

## 2020-12-03 ENCOUNTER — Other Ambulatory Visit: Payer: Self-pay

## 2020-12-03 ENCOUNTER — Ambulatory Visit: Payer: BC Managed Care – PPO | Admitting: Registered Nurse

## 2020-12-03 ENCOUNTER — Encounter: Payer: Self-pay | Admitting: Registered Nurse

## 2020-12-03 VITALS — BP 124/91 | HR 75 | Temp 98.0°F | Resp 18 | Ht 66.0 in | Wt 193.6 lb

## 2020-12-03 DIAGNOSIS — E669 Obesity, unspecified: Secondary | ICD-10-CM

## 2020-12-03 DIAGNOSIS — Z6831 Body mass index (BMI) 31.0-31.9, adult: Secondary | ICD-10-CM

## 2020-12-03 MED ORDER — PHENTERMINE HCL 30 MG PO CAPS: 30.0000 mg | ORAL_CAPSULE | ORAL | 0 refills | Status: DC

## 2020-12-03 NOTE — Progress Notes (Signed)
Established Patient Office Visit  Subjective:  Patient ID: Gwendolyn Nguyen, female    DOB: 1986/05/05  Age: 35 y.o. MRN: [id number]  CC:  Chief Complaint  Patient presents with  . Medication Management    Patient states she is here for and medication management.    HPI Gwendolyn Nguyen presents for weight management  S.p hysterectomy, on supplemental estrogen Followed with specialist appropriately for post op care and titration of estrogen Unfortunately, as with any COCs she has been on in the past, she has experienced some weight gain Usually her weight is 165-175 She reports good diet, regular exercise Has been seen by healthy weight and wellness in the past who helped her lose significant weight, she is using those methods to help a this time. Notes that she had taken medication from Dr. Nolon Rod, who is unfortunately on a medical leave at this time. The medication was not phentermine but she cannot remember what it was.  Past Medical History:  Diagnosis Date  . Anemia   . Back pain   . Constipation   . Endometriosis   . IBS (irritable bowel syndrome)   . Low hemoglobin   . Multiple food allergies   . PID (acute pelvic inflammatory disease) 88/5027   complicated by tuboovarian abscess    Past Surgical History:  Procedure Laterality Date  . IR RADIOLOGIST EVAL & MGMT  05/19/2019  . IR RADIOLOGIST EVAL & MGMT  05/24/2019  . LAPAROSCOPIC LYSIS OF ADHESIONS N/A 07/24/2019   Procedure: Laparoscopic Lysis Of Adhesions, Drainage of Pelvic Abscess;  Surgeon: Servando Salina, MD;  Location: [place];  Service: Gynecology;  Laterality: N/A;  . LAPAROSCOPIC LYSIS OF ADHESIONS N/A 07/24/2019   Procedure: Laparoscopic Lysis Of Adhesions and Assist With Drainage of Pelvic Abscess;  Surgeon: Georganna Skeans, MD;  Location: [place];  Service: General;  Laterality: N/A;  . LAPAROSCOPY N/A 07/24/2019   Procedure: LAPAROSCOPY DIAGNOSTIC;  Surgeon: Servando Salina, MD;  Location: [place];   Service: Gynecology;  Laterality: N/A;  . TOTAL ABDOMINAL HYSTERECTOMY W/ BILATERAL SALPINGOOPHORECTOMY  04/13/2020   full ectocervix not removed  - NEEDS PAP (done at [facility] for endometriosis)    Family History  Problem Relation Age of Onset  . Hyperlipidemia Mother   . Hypertension Mother   . Obesity Mother   . Hyperlipidemia Father   . Hypertension Father   . Obesity Father   . Diabetes Maternal Grandmother   . Colon cancer Neg Hx   . Esophageal cancer Neg Hx   . Inflammatory bowel disease Neg Hx   . Liver disease Neg Hx   . Pancreatic cancer Neg Hx   . Rectal cancer Neg Hx   . Stomach cancer Neg Hx     Social History   Socioeconomic History  . Marital status: Single    Spouse name: Not on file  . Number of children: Not on file  . Years of education: Not on file  . Highest education level: Not on file  Occupational History  . Occupation: Social worker  Tobacco Use  . Smoking status: Never Smoker  . Smokeless tobacco: Never Used  Vaping Use  . Vaping Use: Never used  Substance and Sexual Activity  . Alcohol use: Yes    Comment: occassional  . Drug use: Never  . Sexual activity: Not on file  Other Topics Concern  . Not on file  Social History Narrative  . Not on file   Social Determinants of Health   Financial Resource Strain:  Not on file  Food Insecurity: Not on file  Transportation Needs: Not on file  Physical Activity: Not on file  Stress: Not on file  Social Connections: Not on file  Intimate Partner Violence: Not on file    Outpatient Medications Prior to Visit  Medication Sig Dispense Refill  . cyclobenzaprine (FLEXERIL) 5 MG tablet Take 5 mg by mouth 3 (three) times daily as needed for muscle spasms.    . clonazePAM (KLONOPIN) 0.5 MG tablet Take 1 tablet (0.5 mg total) by mouth at bedtime. For GI symptoms. (Patient not taking: No sig reported) 30 tablet 0  . DULoxetine (CYMBALTA) 20 MG capsule Take 20 mg by mouth daily. (Patient not taking: No sig  reported)    . esomeprazole (NEXIUM) 40 MG capsule TAKE 1 CAPSULE (40 MG TOTAL) BY MOUTH DAILY AT 12 NOON. (Patient not taking: Reported on 12/03/2020) 90 capsule 1  . gabapentin (NEURONTIN) 100 MG capsule Take by mouth 3 (three) times daily.  (Patient not taking: Reported on 12/03/2020)    . letrozole (FEMARA) 2.5 MG tablet Take 2.5 mg by mouth daily. (Patient not taking: No sig reported)    . naproxen sodium (ALEVE) 220 MG tablet Take 220 mg by mouth as needed. (Patient not taking: No sig reported)    . norethindrone (AYGESTIN) 5 MG tablet Take 5 mg by mouth daily. (Patient not taking: No sig reported)    . predniSONE (DELTASONE) 50 MG tablet Take as directed prior to CT scan. (Patient not taking: No sig reported) 3 tablet 0  . traMADol (ULTRAM) 50 MG tablet Take by mouth every 6 (six) hours as needed. (Patient not taking: No sig reported)     No facility-administered medications prior to visit.    Allergies  Allergen Reactions  . Gadolinium Derivatives Hives, Itching and Nausea And Vomiting    Pt vomited immediately during injection.  15 minutes later, pt developed hives and itching.   . Shellfish Allergy Hives  . Sulfa Antibiotics Other (See Comments)    Patient was told to NOT TAKE THIS  . Iohexol Hives and Rash    08-2018, rash and hives, needs pre meds S/W PATIENT STATED REACTION FROM CT CONTRAST 11/19    ROS Review of Systems  Constitutional: Negative.   HENT: Negative.   Eyes: Negative.   Respiratory: Negative.   Cardiovascular: Negative.   Gastrointestinal: Negative.   Genitourinary: Negative.   Musculoskeletal: Negative.   Skin: Negative.   Neurological: Negative.   Psychiatric/Behavioral: Negative.   All other systems reviewed and are negative.     Objective:    Physical Exam Vitals and nursing note reviewed.  Constitutional:      General: She is not in acute distress.    Appearance: Normal appearance. She is normal weight. She is not ill-appearing,  toxic-appearing or diaphoretic.  Cardiovascular:     Rate and Rhythm: Normal rate and regular rhythm.     Heart sounds: Normal heart sounds. No murmur heard. No friction rub. No gallop.   Pulmonary:     Effort: Pulmonary effort is normal. No respiratory distress.     Breath sounds: Normal breath sounds. No stridor. No wheezing, rhonchi or rales.  Chest:     Chest wall: No tenderness.  Skin:    General: Skin is warm and dry.  Neurological:     General: No focal deficit present.     Mental Status: She is alert and oriented to person, place, and time. Mental status is at baseline.  Psychiatric:        Mood and Affect: Mood normal.        Behavior: Behavior normal.        Thought Content: Thought content normal.        Judgment: Judgment normal.     BP (!) 124/91   Pulse 75   Temp 98 F (36.7 C) (Temporal)   Resp 18   Ht 5\' 6"  (1.676 m)   Wt 193 lb 9.6 oz (87.8 kg)   SpO2 100%   BMI 31.25 kg/m  Wt Readings from Last 3 Encounters:  12/03/20 193 lb 9.6 oz (87.8 kg)  08/31/20 190 lb 12.8 oz (86.5 kg)  08/13/20 189 lb (85.7 kg)     There are no preventive care reminders to display for this patient.  There are no preventive care reminders to display for this patient.  Lab Results  Component Value Date   TSH 2.230 08/13/2020   Lab Results  Component Value Date   WBC 11.2 (H) 02/21/2020   HGB 13.2 02/21/2020   HCT 40.5 02/21/2020   MCV 83.1 02/21/2020   PLT 404.0 (H) 02/21/2020   Lab Results  Component Value Date   NA 133 (L) 02/21/2020   K 4.1 02/21/2020   CO2 29 02/21/2020   GLUCOSE 85 02/21/2020   BUN 10 02/21/2020   CREATININE 1.03 02/21/2020   BILITOT 0.4 02/21/2020   ALKPHOS 78 02/21/2020   AST 116 (H) 02/21/2020   ALT 326 (H) 02/21/2020   PROT 8.1 02/21/2020   ALBUMIN 3.9 02/21/2020   CALCIUM 9.2 02/21/2020   ANIONGAP 13 08/03/2019   GFR 75.15 02/21/2020   Lab Results  Component Value Date   CHOL 150 11/25/2018   Lab Results  Component Value  Date   HDL 56 11/25/2018   Lab Results  Component Value Date   LDLCALC 86 11/25/2018   Lab Results  Component Value Date   TRIG 41 11/25/2018   No results found for: CHOLHDL No results found for: HGBA1C    Assessment & Plan:   Problem List Items Addressed This Visit   None   Visit Diagnoses    Class 1 obesity without serious comorbidity with body mass index (BMI) of 31.0 to 31.9 in adult, unspecified obesity type    -  Primary   Relevant Medications   phentermine 30 MG capsule      Meds ordered this encounter  Medications  . phentermine 30 MG capsule    Sig: Take 1 capsule (30 mg total) by mouth every morning.    Dispense:  30 capsule    Refill:  0    Order Specific Question:   Supervising Provider    Answer:   Carlota Raspberry, JEFFREY R [[id number]]    Follow-up: No follow-ups on file.   PLAN  Discussed r/b/se of phentermine. Encourage continued diet and exercise.  Can consider healthy weight and wellness referral again if needed  Return prn  Patient encouraged to call clinic with any questions, comments, or concerns.  Maximiano Coss, NP

## 2020-12-03 NOTE — Patient Instructions (Signed)
° ° ° °  If you have lab work done today you will be contacted with your lab results within the next 2 weeks.  If you have not heard from us then please contact us. The fastest way to get your results is to register for My Chart. ° ° °IF you received an x-ray today, you will receive an invoice from [facility]. Please contact [facility] at [phone] with questions or concerns regarding your invoice.  ° °IF you received labwork today, you will receive an invoice from [facility]. Please contact [facility] at [phone] with questions or concerns regarding your invoice.  ° °Our billing staff will not be able to assist you with questions regarding bills from these companies. ° °You will be contacted with the lab results as soon as they are available. The fastest way to get your results is to activate your My Chart account. Instructions are located on the last page of this paperwork. If you have not heard from us regarding the results in 2 weeks, please contact this office. °  ° ° ° °

## 2021-01-03 ENCOUNTER — Other Ambulatory Visit: Payer: Self-pay | Admitting: Registered Nurse

## 2021-01-03 ENCOUNTER — Encounter (HOSPITAL_BASED_OUTPATIENT_CLINIC_OR_DEPARTMENT_OTHER): Payer: Self-pay | Admitting: Physical Therapy

## 2021-01-03 DIAGNOSIS — E669 Obesity, unspecified: Secondary | ICD-10-CM

## 2021-01-07 ENCOUNTER — Encounter: Payer: Self-pay | Admitting: Registered Nurse

## 2021-01-07 DIAGNOSIS — E669 Obesity, unspecified: Secondary | ICD-10-CM

## 2021-01-07 MED ORDER — PHENTERMINE HCL 30 MG PO CAPS: 30.0000 mg | ORAL_CAPSULE | ORAL | 0 refills | Status: DC

## 2021-01-07 NOTE — Telephone Encounter (Signed)
Pt requesting refill last filled 12/03/2020 30 tablets

## 2021-12-31 ENCOUNTER — Telehealth: Payer: Self-pay | Admitting: Oncology

## 2021-12-31 NOTE — Telephone Encounter (Signed)
Scheduled appt per 3/10 referral. Pt is aware of appt date and time. Pt is aware to arrive 15 mins prior to appt time and to bring and updated insurance card. Pt is aware of appt location.   ?

## 2022-01-02 ENCOUNTER — Inpatient Hospital Stay: Payer: BC Managed Care – PPO | Attending: Oncology | Admitting: Oncology

## 2022-01-02 ENCOUNTER — Other Ambulatory Visit: Payer: Self-pay

## 2022-01-02 VITALS — BP 154/100 | HR 81 | Temp 97.9°F | Resp 19 | Ht 66.0 in | Wt 200.9 lb

## 2022-01-02 DIAGNOSIS — E119 Type 2 diabetes mellitus without complications: Secondary | ICD-10-CM | POA: Diagnosis not present

## 2022-01-02 DIAGNOSIS — G894 Chronic pain syndrome: Secondary | ICD-10-CM | POA: Diagnosis not present

## 2022-01-02 DIAGNOSIS — I10 Essential (primary) hypertension: Secondary | ICD-10-CM | POA: Insufficient documentation

## 2022-01-02 DIAGNOSIS — R700 Elevated erythrocyte sedimentation rate: Secondary | ICD-10-CM | POA: Insufficient documentation

## 2022-01-02 DIAGNOSIS — Z79899 Other long term (current) drug therapy: Secondary | ICD-10-CM | POA: Insufficient documentation

## 2022-01-02 DIAGNOSIS — M797 Fibromyalgia: Secondary | ICD-10-CM | POA: Insufficient documentation

## 2022-01-02 DIAGNOSIS — R233 Spontaneous ecchymoses: Secondary | ICD-10-CM | POA: Insufficient documentation

## 2022-01-02 DIAGNOSIS — Z7952 Long term (current) use of systemic steroids: Secondary | ICD-10-CM | POA: Insufficient documentation

## 2022-01-02 DIAGNOSIS — E669 Obesity, unspecified: Secondary | ICD-10-CM | POA: Insufficient documentation

## 2022-01-02 DIAGNOSIS — K589 Irritable bowel syndrome without diarrhea: Secondary | ICD-10-CM | POA: Diagnosis not present

## 2022-01-02 NOTE — Progress Notes (Signed)
?Reason for the request:   Easy bruising ? ?HPI: I was asked by Dr. Ayesha Rumpf to evaluate Gwendolyn Nguyen for the evaluation of ecchymosis.  She is a 36 year old woman with history of fall irritable bowel syndrome and elevated ANA.  She was diagnosed with chronic pain syndrome and fibromyalgia and was prescribed prednisone recently.  She has noticed occasional ecchymosis noted on her upper extremity as well as lower extremity that have been spontaneous.  She denies any bleeding issues.  She denies any hematochezia, melena or hemoptysis.  She denies any bleeding after dental procedures or surgery.  She is status post hysterectomy and does not have any menstrual bleeding at this time.  She denies any constitutional symptoms. ? ?Laboratory data obtained on January 2023 showed a elevated sedimentation rate of 25 and a positive ANA of 1:40 titer.  Creatinine is normal at 0.97 with normal electrolytes and liver function test.  CBC obtained at that time showed a white cell count of 6.6, hemoglobin of 4.3 and a platelet count of 297.  She was evaluated by rheumatology and repeat laboratory testing showed no major abnormalities including autoimmune panel for Sjogren's and scleroderma.  Repeat CBC showed hemoglobin of 11.9 with MCV of 80.6 normal differential.  Coagulation parameters including PT PTT were normal in 2021. ? ? She does not report any headaches, blurry vision, syncope or seizures. Does not report any fevers, chills or sweats.  Does not report any cough, wheezing or hemoptysis.  Does not report any chest pain, palpitation, orthopnea or leg edema.  Does not report any nausea, vomiting or abdominal pain.  Does not report any constipation or diarrhea.  Does not report any skeletal complaints.    Does not report frequency, urgency or hematuria.  Does not report any skin rashes or lesions. Does not report any heat or cold intolerance.  Does not report any lymphadenopathy or petechiae.  Does not report any anxiety or  depression.  Remaining review of systems is negative.  ? ? ? ?Past Medical History:  ?Diagnosis Date  ? Anemia   ? Back pain   ? Constipation   ? Endometriosis   ? IBS (irritable bowel syndrome)   ? Low hemoglobin   ? Multiple food allergies   ? PID (acute pelvic inflammatory disease) 04/2019  ? complicated by tuboovarian abscess  ?: ? ? ?Past Surgical History:  ?Procedure Laterality Date  ? IR RADIOLOGIST EVAL & MGMT  05/19/2019  ? IR RADIOLOGIST EVAL & MGMT  05/24/2019  ? LAPAROSCOPIC LYSIS OF ADHESIONS N/A 07/24/2019  ? Procedure: Laparoscopic Lysis Of Adhesions, Drainage of Pelvic Abscess;  Surgeon: Servando Salina, MD;  Location: [place];  Service: Gynecology;  Laterality: N/A;  ? LAPAROSCOPIC LYSIS OF ADHESIONS N/A 07/24/2019  ? Procedure: Laparoscopic Lysis Of Adhesions and Assist With Drainage of Pelvic Abscess;  Surgeon: Georganna Skeans, MD;  Location: [place];  Service: General;  Laterality: N/A;  ? LAPAROSCOPY N/A 07/24/2019  ? Procedure: LAPAROSCOPY DIAGNOSTIC;  Surgeon: Servando Salina, MD;  Location: [place];  Service: Gynecology;  Laterality: N/A;  ? TOTAL ABDOMINAL HYSTERECTOMY W/ BILATERAL SALPINGOOPHORECTOMY  04/13/2020  ? full ectocervix not removed  - NEEDS PAP (done at [facility] for endometriosis)  ?: ? ? ?Current Outpatient Medications:  ?  clonazePAM (KLONOPIN) 0.5 MG tablet, Take 1 tablet (0.5 mg total) by mouth at bedtime. For GI symptoms. (Patient not taking: No sig reported), Disp: 30 tablet, Rfl: 0 ?  cyclobenzaprine (FLEXERIL) 5 MG tablet, Take 5 mg by mouth  3 (three) times daily as needed for muscle spasms., Disp: , Rfl:  ?  DULoxetine (CYMBALTA) 20 MG capsule, Take 20 mg by mouth daily. (Patient not taking: No sig reported), Disp: , Rfl:  ?  esomeprazole (NEXIUM) 40 MG capsule, TAKE 1 CAPSULE (40 MG TOTAL) BY MOUTH DAILY AT 12 NOON. (Patient not taking: Reported on 12/03/2020), Disp: 90 capsule, Rfl: 1 ?  gabapentin (NEURONTIN) 100 MG capsule, Take by mouth 3 (three) times daily.  (Patient not  taking: Reported on 12/03/2020), Disp: , Rfl:  ?  letrozole (FEMARA) 2.5 MG tablet, Take 2.5 mg by mouth daily. (Patient not taking: No sig reported), Disp: , Rfl:  ?  naproxen sodium (ALEVE) 220 MG tablet, Take 220 mg by mouth as needed. (Patient not taking: No sig reported), Disp: , Rfl:  ?  norethindrone (AYGESTIN) 5 MG tablet, Take 5 mg by mouth daily. (Patient not taking: No sig reported), Disp: , Rfl:  ?  phentermine 30 MG capsule, Take 1 capsule (30 mg total) by mouth every morning., Disp: 30 capsule, Rfl: 0 ?  predniSONE (DELTASONE) 50 MG tablet, Take as directed prior to CT scan. (Patient not taking: No sig reported), Disp: 3 tablet, Rfl: 0 ?  traMADol (ULTRAM) 50 MG tablet, Take by mouth every 6 (six) hours as needed. (Patient not taking: No sig reported), Disp: , Rfl: : ? ? ?Allergies  ?Allergen Reactions  ? Gadolinium Derivatives Hives, Itching and Nausea And Vomiting  ?  Pt vomited immediately during injection.  15 minutes later, pt developed hives and itching.   ? Shellfish Allergy Hives  ? Sulfa Antibiotics Other (See Comments)  ?  Patient was told to NOT TAKE THIS  ? Iohexol Hives and Rash  ?  08-2018, rash and hives, needs pre meds S/W PATIENT STATED REACTION FROM CT CONTRAST 11/19  ?: ? ? ?Family History  ?Problem Relation Age of Onset  ? Hyperlipidemia Mother   ? Hypertension Mother   ? Obesity Mother   ? Hyperlipidemia Father   ? Hypertension Father   ? Obesity Father   ? Diabetes Maternal Grandmother   ? Colon cancer Neg Hx   ? Esophageal cancer Neg Hx   ? Inflammatory bowel disease Neg Hx   ? Liver disease Neg Hx   ? Pancreatic cancer Neg Hx   ? Rectal cancer Neg Hx   ? Stomach cancer Neg Hx   ?: ? ? ?Social History  ? ?Socioeconomic History  ? Marital status: Single  ?  Spouse name: Not on file  ? Number of children: Not on file  ? Years of education: Not on file  ? Highest education level: Not on file  ?Occupational History  ? Occupation: Counselor  ?Tobacco Use  ? Smoking status: Never  ?  Smokeless tobacco: Never  ?Vaping Use  ? Vaping Use: Never used  ?Substance and Sexual Activity  ? Alcohol use: Yes  ?  Comment: occassional  ? Drug use: Never  ? Sexual activity: Not on file  ?Other Topics Concern  ? Not on file  ?Social History Narrative  ? Not on file  ? ?Social Determinants of Health  ? ?Financial Resource Strain: Not on file  ?Food Insecurity: Not on file  ?Transportation Needs: Not on file  ?Physical Activity: Not on file  ?Stress: Not on file  ?Social Connections: Not on file  ?Intimate Partner Violence: Not on file  ?: ? ?Pertinent items are noted in HPI. ? ?Exam: ?There were no vitals  taken for this visit. ?General appearance: alert and cooperative appeared without distress. ?Head: atraumatic without any abnormalities. ?Eyes: conjunctivae/corneas clear. PERRL.  Sclera anicteric. ?Throat: lips, mucosa, and tongue normal; without oral thrush or ulcers. ?Resp: clear to auscultation bilaterally without rhonchi, wheezes or dullness to percussion. ?Cardio: regular rate and rhythm, S1, S2 normal, no murmur, click, rub or gallop ?GI: soft, non-tender; bowel sounds normal; no masses,  no organomegaly ?Skin: Normal skin examination.  Faint ecchymosis noted on her right calf.  No petechiae. ?Lymph nodes: Cervical, supraclavicular, and axillary nodes normal. ?Neurologic: Grossly normal without any motor, sensory or deep tendon reflexes. ?Musculoskeletal: No joint deformity or effusion. ? ? ? ?Assessment and Plan:  ? ? ?36 year old woman with: ? ?1.  Ecchymosis noted in the last 6 months without any active bleeding.  No evidence of petechiae in the setting of nonsteroidal anti-inflammatory use as well as prednisone use. ? ?The differential diagnosis of these findings were discussed at this time.  The possibility of a hematological disorder or coagulation disorder were discussed at this time.  She has normal laboratory testing at this time including normal CBC and coagulation parameters including  platelet count as well as PT and PTT.  Her history does not support the diagnosis of bleeding disorder such as von Willebrand's, hemophilia or factor deficiency.  She has had multiple procedures in the past without any p

## 2022-01-10 ENCOUNTER — Encounter: Payer: Self-pay | Admitting: Oncology

## 2022-01-15 ENCOUNTER — Encounter: Payer: Self-pay | Admitting: Nurse Practitioner

## 2022-01-15 ENCOUNTER — Ambulatory Visit: Payer: BC Managed Care – PPO | Admitting: Nurse Practitioner

## 2022-01-15 VITALS — BP 130/70 | HR 65 | Temp 98.5°F | Ht 64.4 in | Wt 201.0 lb

## 2022-01-15 DIAGNOSIS — Z7689 Persons encountering health services in other specified circumstances: Secondary | ICD-10-CM

## 2022-01-15 DIAGNOSIS — Z8719 Personal history of other diseases of the digestive system: Secondary | ICD-10-CM

## 2022-01-15 DIAGNOSIS — M2550 Pain in unspecified joint: Secondary | ICD-10-CM | POA: Diagnosis not present

## 2022-01-15 DIAGNOSIS — R1084 Generalized abdominal pain: Secondary | ICD-10-CM | POA: Diagnosis not present

## 2022-01-15 DIAGNOSIS — M797 Fibromyalgia: Secondary | ICD-10-CM

## 2022-01-15 MED ORDER — DULOXETINE HCL 30 MG PO CPEP: 30.0000 mg | ORAL_CAPSULE | Freq: Every day | ORAL | 2 refills | Status: DC

## 2022-01-15 MED ORDER — KETOROLAC TROMETHAMINE 10 MG PO TABS: 10.0000 mg | ORAL_TABLET | Freq: Four times a day (QID) | ORAL | 2 refills | Status: DC | PRN

## 2022-01-15 MED ORDER — TRAMADOL HCL 50 MG PO TABS: 50.0000 mg | ORAL_TABLET | Freq: Four times a day (QID) | ORAL | 1 refills | Status: DC | PRN

## 2022-01-15 MED ORDER — GABAPENTIN 300 MG PO CAPS: 300.0000 mg | ORAL_CAPSULE | Freq: Three times a day (TID) | ORAL | 2 refills | Status: DC

## 2022-01-15 MED ORDER — MELOXICAM 15 MG PO TABS: 15.0000 mg | ORAL_TABLET | Freq: Every day | ORAL | 1 refills | Status: DC

## 2022-01-15 NOTE — Progress Notes (Signed)
?Industrial/product designer as a Education administrator for [employer], FNP.,have documented all relevant documentation on the behalf of Minette Brine, FNP,as directed by  Minette Brine, FNP while in the presence of Minette Brine, [place]. ? ?This visit occurred during the SARS-CoV-2 public health emergency.  Safety protocols were in place, including screening questions prior to the visit, additional usage of staff PPE, and extensive cleaning of exam room while observing appropriate contact time as indicated for disinfecting solutions. ? ?Subjective:  ?  ? Patient ID: Gwendolyn Nguyen , female    DOB: [birth year] , 36 y.o.   MRN: [id number] ? ? ?Chief Complaint  ?Patient presents with  ? Establish Care  ? ? ?HPI ? ?Patient is here to establish care. She goes to [facility], last seen one month ago.  She was referred by a friend.  She is employed doing counseling.  Single. No children.  GYN - [facility] - PAP up to date.   ? ?PMH - she had a bowel obstruction in 2020 - she was hospitalized and had an ileostomy and NG tube, full hysterectomy and drains to your ovaries. She reports she has had chronic inflammation. She has seen Dr. Collene Mares in the past. She has not seen a GI provider since that time - had ileostomy removed in 2021. Continues to have pain to her stomach feels raw to the inside. She has joint pain wrist, knees and hands. And pain to her back - may possibly have arthritis to her back. She had her hysterectomy in 2021. She has also had partial removal of rectum and stomach.   ? ?She has been to Rheumatology at [place] and [place] (last seen them in February 2023) all labs normal except for ANA positive.  ? ?She is concerned with body pains. She has chronic inflammation. She is not taking the effexor since taking progesterone  ?  ? ?Past Medical History:  ?Diagnosis Date  ? Anemia   ? Back pain   ? Constipation   ? Endometriosis   ? IBS (irritable bowel syndrome)   ? Low hemoglobin   ? Multiple food  allergies   ? PID (acute pelvic inflammatory disease) 04/2019  ? complicated by tuboovarian abscess  ?  ? ?Family History  ?Problem Relation Age of Onset  ? Hyperlipidemia Mother   ? Hypertension Mother   ? Obesity Mother   ? Hyperlipidemia Father   ? Hypertension Father   ? Obesity Father   ? Diabetes Maternal Grandmother   ? Heart disease Maternal Grandfather   ? Colon cancer Neg Hx   ? Esophageal cancer Neg Hx   ? Inflammatory bowel disease Neg Hx   ? Liver disease Neg Hx   ? Pancreatic cancer Neg Hx   ? Rectal cancer Neg Hx   ? Stomach cancer Neg Hx   ? ? ? ?Current Outpatient Medications:  ?  cyclobenzaprine (FLEXERIL) 5 MG tablet, Take 5 mg by mouth 3 (three) times daily as needed for muscle spasms., Disp: , Rfl:  ?  DULoxetine (CYMBALTA) 30 MG capsule, Take 1 capsule (30 mg total) by mouth daily., Disp: 30 capsule, Rfl: 2 ?  predniSONE (DELTASONE) 50 MG tablet, Take as directed prior to CT scan., Disp: 3 tablet, Rfl: 0 ?  gabapentin (NEURONTIN) 300 MG capsule, Take 1 capsule (300 mg total) by mouth 3 (three) times daily., Disp: 90 capsule, Rfl: 2 ?  ketorolac (TORADOL) 10 MG tablet, Take 1 tablet (10 mg total) by mouth every 6 (  six) hours as needed., Disp: 20 tablet, Rfl: 2 ?  meloxicam (MOBIC) 15 MG tablet, Take 1 tablet (15 mg total) by mouth daily. Take as needed, Disp: 90 tablet, Rfl: 1 ?  traMADol (ULTRAM) 50 MG tablet, Take 1 tablet (50 mg total) by mouth every 6 (six) hours as needed., Disp: 30 tablet, Rfl: 1  ? ?Allergies  ?Allergen Reactions  ? Gadolinium Derivatives Hives, Itching and Nausea And Vomiting  ?  Pt vomited immediately during injection.  15 minutes later, pt developed hives and itching.   ? Shellfish Allergy Hives  ? Sulfa Antibiotics Other (See Comments)  ?  Patient was told to NOT TAKE THIS  ? Sulfamethoxazole-Trimethoprim Other (See Comments)  ?  Other reaction(s): hives  ? Iohexol Hives and Rash  ?  08-2018, rash and hives, needs pre meds S/W PATIENT STATED REACTION FROM CT CONTRAST  11/19  ?  ? ?Review of Systems  ?Constitutional: Negative.   ?Respiratory: Negative.    ?Cardiovascular: Negative.   ?Gastrointestinal: Negative.   ?Musculoskeletal:  Positive for arthralgias and myalgias.  ?Neurological: Negative.   ?Psychiatric/Behavioral: Negative.     ? ?Today's Vitals  ? 01/15/22 1539  ?BP: 130/70  ?Pulse: 65  ?Temp: 98.5 ?F (36.9 ?C)  ?TempSrc: Oral  ?Weight: 201 lb (91.2 kg)  ?Height: 5' 4.4" (1.636 m)  ? ?Body mass index is 34.07 kg/m?.  ? ?Objective:  ?Physical Exam ?Vitals reviewed.  ?Constitutional:   ?   General: She is not in acute distress. ?   Appearance: Normal appearance.  ?Cardiovascular:  ?   Rate and Rhythm: Normal rate and regular rhythm.  ?   Pulses: Normal pulses.  ?   Heart sounds: Normal heart sounds. No murmur heard. ?Pulmonary:  ?   Effort: Pulmonary effort is normal. No respiratory distress.  ?   Breath sounds: Normal breath sounds. No wheezing.  ?Skin: ?   General: Skin is warm and dry.  ?   Capillary Refill: Capillary refill takes less than 2 seconds.  ?Neurological:  ?   General: No focal deficit present.  ?   Mental Status: She is alert and oriented to person, place, and time.  ?   Cranial Nerves: No cranial nerve deficit.  ?   Motor: No weakness.  ?Psychiatric:     ?   Mood and Affect: Mood normal.     ?   Behavior: Behavior normal.     ?   Thought Content: Thought content normal.     ?   Judgment: Judgment normal.  ?  ? ?   ?Assessment And Plan:  ?   ?1. Fibromyalgia ?Comments: She is taking gabapentin for her fibromyalgia ?- gabapentin (NEURONTIN) 300 MG capsule; Take 1 capsule (300 mg total) by mouth 3 (three) times daily.  Dispense: 90 capsule; Refill: 2 ? ?2. Generalized abdominal pain ?Comments: has been having intermittent pain to abdomen, mild tenderness on palpation ?- Ambulatory referral to Gastroenterology ? ?3. Arthralgia, unspecified joint ?Comments: Will try her on cymbalta due to may help with fibromyalgia pain.  ?- DULoxetine (CYMBALTA) 30 MG capsule;  Take 1 capsule (30 mg total) by mouth daily.  Dispense: 30 capsule; Refill: 2 ?- meloxicam (MOBIC) 15 MG tablet; Take 1 tablet (15 mg total) by mouth daily. Take as needed  Dispense: 90 tablet; Refill: 1 ?- ketorolac (TORADOL) 10 MG tablet; Take 1 tablet (10 mg total) by mouth every 6 (six) hours as needed.  Dispense: 20 tablet; Refill: 2 ?- traMADol (ULTRAM)  50 MG tablet; Take 1 tablet (50 mg total) by mouth every 6 (six) hours as needed.  Dispense: 30 tablet; Refill: 1 ? ?4. Establishing care with new doctor, encounter for ? ?5. History of gastritis ?- Ambulatory referral to Gastroenterology ? ?6. History of small bowel obstruction ?- Ambulatory referral to Gastroenterology ?  ? ? ?Patient was given opportunity to ask questions. Patient verbalized understanding of the plan and was able to repeat key elements of the plan. All questions were answered to their satisfaction.  ?Minette Brine, FNP  ? ?I, Minette Brine, FNP, have reviewed all documentation for this visit. The documentation on 01/15/22 for the exam, diagnosis, procedures, and orders are all accurate and complete.  ? ?IF YOU HAVE BEEN REFERRED TO A SPECIALIST, IT MAY TAKE 1-2 WEEKS TO SCHEDULE/PROCESS THE REFERRAL. IF YOU HAVE NOT HEARD FROM US/SPECIALIST IN TWO WEEKS, PLEASE GIVE [place] A CALL AT [phone].  ? ?THE PATIENT IS ENCOURAGED TO PRACTICE SOCIAL DISTANCING DUE TO THE COVID-19 PANDEMIC.   ?

## 2022-01-27 ENCOUNTER — Encounter: Payer: Self-pay | Admitting: Nurse Practitioner

## 2022-01-30 ENCOUNTER — Other Ambulatory Visit: Payer: Self-pay | Admitting: Gastroenterology

## 2022-01-30 DIAGNOSIS — R109 Unspecified abdominal pain: Secondary | ICD-10-CM

## 2022-02-05 ENCOUNTER — Encounter: Payer: Self-pay | Admitting: Nurse Practitioner

## 2022-02-05 MED ORDER — PREDNISONE 50 MG PO TABS: ORAL_TABLET | ORAL | 0 refills | Status: DC

## 2022-02-05 MED ORDER — DIPHENHYDRAMINE HCL 50 MG PO TABS: 50.0000 mg | ORAL_TABLET | Freq: Once | ORAL | 0 refills | Status: DC

## 2022-02-05 NOTE — Progress Notes (Signed)
Phone call to patient to review instructions for 13 hr prep for CT w/ contrast on 02/12/22  at 8:30 am. Prescription called into [facility]. Pt to take 50 mg of prednisone on 02/11/22 at 7:30 pm, 50 mg of prednisone on 02/12/22 at 1:30 am, and 50 mg of prednisone on 02/12/22 at 7:30 am. Pt is also to take 50 mg of benadryl on 02/12/22 at 7:30 am. Please call [phone] with any questions. Pt aware and verbalized understanding of instructions. ?

## 2022-02-12 ENCOUNTER — Other Ambulatory Visit: Payer: Self-pay | Admitting: Nurse Practitioner

## 2022-02-12 ENCOUNTER — Inpatient Hospital Stay: Admission: RE | Admit: 2022-02-12 | Payer: BC Managed Care – PPO | Source: Ambulatory Visit

## 2022-02-12 DIAGNOSIS — M2550 Pain in unspecified joint: Secondary | ICD-10-CM

## 2022-02-13 ENCOUNTER — Ambulatory Visit: Payer: BC Managed Care – PPO | Admitting: Nurse Practitioner

## 2022-02-13 ENCOUNTER — Other Ambulatory Visit: Payer: Self-pay | Admitting: Gastroenterology

## 2022-02-13 ENCOUNTER — Encounter: Payer: Self-pay | Admitting: Nurse Practitioner

## 2022-02-13 VITALS — BP 134/78 | HR 83 | Temp 98.5°F | Ht 64.4 in | Wt 211.0 lb

## 2022-02-13 DIAGNOSIS — M797 Fibromyalgia: Secondary | ICD-10-CM | POA: Diagnosis not present

## 2022-02-13 DIAGNOSIS — R109 Unspecified abdominal pain: Secondary | ICD-10-CM

## 2022-02-13 DIAGNOSIS — Z6835 Body mass index (BMI) 35.0-35.9, adult: Secondary | ICD-10-CM

## 2022-02-13 DIAGNOSIS — E6609 Other obesity due to excess calories: Secondary | ICD-10-CM | POA: Diagnosis not present

## 2022-02-13 DIAGNOSIS — R7989 Other specified abnormal findings of blood chemistry: Secondary | ICD-10-CM

## 2022-02-13 MED ORDER — GABAPENTIN 100 MG PO CAPS: 100.0000 mg | ORAL_CAPSULE | Freq: Every day | ORAL | 1 refills | Status: DC

## 2022-02-13 MED ORDER — GABAPENTIN 300 MG PO CAPS: 300.0000 mg | ORAL_CAPSULE | Freq: Three times a day (TID) | ORAL | 2 refills | Status: DC

## 2022-02-13 NOTE — Progress Notes (Signed)
?Industrial/product designer as a Education administrator for [employer], FNP.,have documented all relevant documentation on the behalf of Minette Brine, FNP,as directed by  Minette Brine, FNP while in the presence of Minette Brine, [place]. ? ?This visit occurred during the SARS-CoV-2 public health emergency.  Safety protocols were in place, including screening questions prior to the visit, additional usage of staff PPE, and extensive cleaning of exam room while observing appropriate contact time as indicated for disinfecting solutions. ? ?Subjective:  ?  ? Patient ID: Gwendolyn Nguyen , female    DOB: 17-May-1986 , 36 y.o.   MRN: [id number] ? ? ?Chief Complaint  ?Patient presents with  ? Fibromyalgia  ? ? ?HPI ? ?Patient presents for follow up for fibromyalgia. She is taking gabapentin and cymbalta. She was already taking gabapentin 300 mg prior to her last visit and that alone was not effective. Cymbalta is new since her last visit - does not help as much. She feels the prednisone helps the most when she takes a taper. She was not taking the prednisone '5mg'$  as regularly. She was also taking meloxicam daily. She tries to save the prednisone for when she feels like she is having a flare. She reports having swelling when she has a flare.  ?  ? ?Past Medical History:  ?Diagnosis Date  ? Anemia   ? Back pain   ? Constipation   ? Endometriosis   ? IBS (irritable bowel syndrome)   ? Low hemoglobin   ? Multiple food allergies   ? PID (acute pelvic inflammatory disease) 04/2019  ? complicated by tuboovarian abscess  ? TOA (tubo-ovarian abscess) 07/24/2019  ?  ? ?Family History  ?Problem Relation Age of Onset  ? Hyperlipidemia Mother   ? Hypertension Mother   ? Obesity Mother   ? Hyperlipidemia Father   ? Hypertension Father   ? Obesity Father   ? Diabetes Maternal Grandmother   ? Heart disease Maternal Grandfather   ? Colon cancer Neg Hx   ? Esophageal cancer Neg Hx   ? Inflammatory bowel disease Neg Hx   ? Liver disease Neg Hx   ? Pancreatic cancer Neg  Hx   ? Rectal cancer Neg Hx   ? Stomach cancer Neg Hx   ? ? ? ?Current Outpatient Medications:  ?  cyclobenzaprine (FLEXERIL) 5 MG tablet, Take 5 mg by mouth 3 (three) times daily as needed for muscle spasms., Disp: , Rfl:  ?  diphenhydrAMINE (BENADRYL) 50 MG tablet, Take 1 tablet (50 mg total) by mouth once for 1 dose., Disp: 1 tablet, Rfl: 0 ?  DULoxetine (CYMBALTA) 30 MG capsule, TAKE 1 CAPSULE BY MOUTH EVERY DAY, Disp: 90 capsule, Rfl: 1 ?  gabapentin (NEURONTIN) 100 MG capsule, Take 1 capsule (100 mg total) by mouth daily. Take with 300 mg tab total 400 mg TID, Disp: 90 capsule, Rfl: 1 ?  ketorolac (TORADOL) 10 MG tablet, Take 1 tablet (10 mg total) by mouth every 6 (six) hours as needed., Disp: 20 tablet, Rfl: 2 ?  meloxicam (MOBIC) 15 MG tablet, Take 1 tablet (15 mg total) by mouth daily. Take as needed, Disp: 90 tablet, Rfl: 1 ?  traMADol (ULTRAM) 50 MG tablet, Take 1 tablet (50 mg total) by mouth every 6 (six) hours as needed., Disp: 30 tablet, Rfl: 1 ?  gabapentin (NEURONTIN) 300 MG capsule, Take 1 capsule (300 mg total) by mouth 3 (three) times daily., Disp: 90 capsule, Rfl: 2 ?  norethindrone (MICRONOR) 0.35 MG tablet, 1 tablet,  Disp: , Rfl:   ? ?Allergies  ?Allergen Reactions  ? Gadolinium Derivatives Hives, Itching and Nausea And Vomiting  ?  Pt vomited immediately during injection.  15 minutes later, pt developed hives and itching.   ? Shellfish Allergy Hives  ? Sulfa Antibiotics Other (See Comments)  ?  Patient was told to NOT TAKE THIS  ? Sulfamethoxazole-Trimethoprim Other (See Comments)  ?  Other reaction(s): hives  ? Iohexol Hives and Rash  ?  08-2018, rash and hives, needs pre meds S/W PATIENT STATED REACTION FROM CT CONTRAST 11/19  ?  ? ?Review of Systems  ?Constitutional: Negative.   ?Respiratory: Negative.    ?Cardiovascular: Negative.   ?Gastrointestinal: Negative.   ?Neurological: Negative.   ?Psychiatric/Behavioral: Negative.     ? ?Today's Vitals  ? 02/13/22 0828  ?BP: 134/78  ?Pulse: 83   ?Temp: 98.5 ?F (36.9 ?C)  ?TempSrc: Oral  ?Weight: 211 lb (95.7 kg)  ?Height: 5' 4.4" (1.636 m)  ? ?Body mass index is 35.77 kg/m?.  ?Wt Readings from Last 3 Encounters:  ?02/13/22 211 lb (95.7 kg)  ?01/15/22 201 lb (91.2 kg)  ?01/02/22 200 lb 14.4 oz (91.1 kg)  ? ? ?Objective:  ?Physical Exam ?Vitals reviewed.  ?Constitutional:   ?   General: She is not in acute distress. ?   Appearance: Normal appearance.  ?Cardiovascular:  ?   Rate and Rhythm: Normal rate and regular rhythm.  ?   Pulses: Normal pulses.  ?   Heart sounds: Normal heart sounds. No murmur heard. ?Pulmonary:  ?   Effort: Pulmonary effort is normal. No respiratory distress.  ?   Breath sounds: Normal breath sounds. No wheezing.  ?Skin: ?   General: Skin is warm and dry.  ?   Capillary Refill: Capillary refill takes less than 2 seconds.  ?Neurological:  ?   General: No focal deficit present.  ?   Mental Status: She is alert and oriented to person, place, and time.  ?   Cranial Nerves: No cranial nerve deficit.  ?   Motor: No weakness.  ?Psychiatric:     ?   Mood and Affect: Mood normal.     ?   Behavior: Behavior normal.     ?   Thought Content: Thought content normal.     ?   Judgment: Judgment normal.  ?  ? ?   ?Assessment And Plan:  ?   ?1. Fibromyalgia ?Comments: Will increase her dose of gabapentin to 400 mg three times a day. Will also check for gluten sensitivity screen at patients request.  ?- Gluten Sensitivity Screen ?- gabapentin (NEURONTIN) 300 MG capsule; Take 1 capsule (300 mg total) by mouth 3 (three) times daily.  Dispense: 90 capsule; Refill: 2 ?- gabapentin (NEURONTIN) 100 MG capsule; Take 1 capsule (100 mg total) by mouth daily. Take with 300 mg tab total 400 mg TID  Dispense: 90 capsule; Refill: 1 ?- Allergens (22) Foods ? ?2. Class 2 obesity due to excess calories without serious comorbidity with body mass index (BMI) of 35.0 to 35.9 in adult ?She is encouraged to strive for BMI less than 30 to decrease cardiac risk. Advised to aim  for at least 150 minutes of exercise per week.In  ?In reviewing some literature gluten may correlate with fibromyalgia flares  ? ?Patient was given opportunity to ask questions. Patient verbalized understanding of the plan and was able to repeat key elements of the plan. All questions were answered to their satisfaction.  ?Minette Brine,  FNP  ? ?I, Minette Brine, FNP, have reviewed all documentation for this visit. The documentation on 02/13/22 for the exam, diagnosis, procedures, and orders are all accurate and complete.  ? ?IF YOU HAVE BEEN REFERRED TO A SPECIALIST, IT MAY TAKE 1-2 WEEKS TO SCHEDULE/PROCESS THE REFERRAL. IF YOU HAVE NOT HEARD FROM US/SPECIALIST IN TWO WEEKS, PLEASE GIVE [place] A CALL AT [phone].  ? ?THE PATIENT IS ENCOURAGED TO PRACTICE SOCIAL DISTANCING DUE TO THE COVID-19 PANDEMIC.   ?

## 2022-02-13 NOTE — Patient Instructions (Signed)
Myofascial Pain Syndrome and Fibromyalgia ?Myofascial pain syndrome and fibromyalgia are both pain disorders. You may feel this pain mainly in your muscles. ?Myofascial pain syndrome: ?Always has tender points in the muscles that will cause pain when pressed (trigger points). The pain may come and go. ?Usually affects your neck, upper back, and shoulder areas. The pain often moves into your arms and hands. ?Fibromyalgia: ?Has muscle pains and tenderness that come and go. ?Is often associated with tiredness (fatigue) and sleep problems. ?Has trigger points. ?Tends to be long-lasting (chronic), but is not life-threatening. ?Fibromyalgia and myofascial pain syndrome are not the same. However, they often occur together. If you have both conditions, each can make the other worse. Both are common and can cause enough pain and fatigue to make day-to-day activities difficult. Both can be hard to diagnose because their symptoms are common in many other conditions. ?What are the causes? ?The exact causes of these conditions are not known. ?What increases the risk? ?You are more likely to develop either of these conditions if: ?You have a family history of the condition. ?You are female. ?You have certain triggers, such as: ?Spine disorders. ?An injury (trauma) or other physical stressors. ?Being under a lot of stress. ?Medical conditions such as osteoarthritis, rheumatoid arthritis, or lupus. ?What are the signs or symptoms? ?Fibromyalgia ?The main symptom of fibromyalgia is widespread pain and tenderness in your muscles. Pain is sometimes described as stabbing, shooting, or burning. ?You may also have: ?Tingling or numbness. ?Sleep problems and fatigue. ?Problems with attention and concentration (fibro fog). ?Other symptoms may include: ?Bowel and bladder problems. ?Headaches. ?Vision problems. ?Sensitivity to odors and noises. ?Depression or mood changes. ?Painful menstrual periods (dysmenorrhea). ?Dry skin or eyes. ?These  symptoms can vary over time. ?Myofascial pain syndrome ?Symptoms of myofascial pain syndrome include: ?Tight, ropy bands of muscle. ?Uncomfortable sensations in muscle areas. These may include aching, cramping, burning, numbness, tingling, and weakness. ?Difficulty moving certain parts of the body freely (poor range of motion). ?How is this diagnosed? ?This condition may be diagnosed by your symptoms and medical history. You will also have a physical exam. In general: ?Fibromyalgia is diagnosed if you have pain, fatigue, and other symptoms for more than 3 months, and symptoms cannot be explained by another condition. ?Myofascial pain syndrome is diagnosed if you have trigger points in your muscles, and those trigger points are tender and cause pain elsewhere in your body (referred pain). ?How is this treated? ?Treatment for these conditions depends on the type that you have. ?For fibromyalgia a healthy lifestyle is the most important treatment including aerobic and strength exercises. Different types of medicines are used to help treat pain and include: ?NSAIDs. ?Medicines for treating depression. ?Medicines that help control seizures. ?Medicines that relax the muscles. ?Treatment for myofascial pain syndrome includes: ?Pain medicines, such as NSAIDs. ?Cooling and stretching of muscles. ?Massage therapy with myofascial release technique. ?Trigger point injections. ?Treating these conditions often requires a team of health care providers. These may include: ?Your primary care provider. ?A physical therapist. ?Complementary health care providers, such as massage therapists or acupuncturists. ?A psychiatrist for cognitive behavioral therapy. ?Follow these instructions at home: ?Medicines ?Take over-the-counter and prescription medicines only as told by your health care provider. ?Ask your health care provider if the medicine prescribed to you: ?Requires you to avoid driving or using machinery. ?Can cause constipation.  You may need to take these actions to prevent or treat constipation: ?Drink enough fluid to keep your urine pale   yellow. ?Take over-the-counter or prescription medicines. ?Eat foods that are high in fiber, such as beans, whole grains, and fresh fruits and vegetables. ?Limit foods that are high in fat and processed sugars, such as fried or sweet foods. ?Lifestyle ? ?Do exercises as told by your health care provider or physical therapist. ?Practice relaxation techniques to control your stress. You may want to try: ?Biofeedback. ?Visual imagery. ?Hypnosis. ?Muscle relaxation. ?Yoga. ?Meditation. ?Maintain a healthy lifestyle. This includes eating a healthy diet and getting enough sleep. ?Do not use any products that contain nicotine or tobacco. These products include cigarettes, chewing tobacco, and vaping devices, such as e-cigarettes. If you need help quitting, ask your health care provider. ?General instructions ?Talk to your health care provider about complementary treatments, such as acupuncture or massage. ?Do not do activities that stress or strain your muscles. This includes repetitive motions and heavy lifting. ?Keep all follow-up visits. This is important. ?Where to find support ?Consider joining a support group with others who are diagnosed with this condition. ?National Fibromyalgia Association: www.fmaware.org ?Where to find more information ?American Chronic Pain Association: www.theacpa.org ?Contact a health care provider if: ?You have new symptoms. ?Your symptoms get worse or your pain is severe. ?You have side effects from your medicines. ?You have trouble sleeping. ?Your condition is causing depression or anxiety. ?Get help right away if: ?You have thoughts of hurting yourself or others. ?Get help right awayif you feel like you may hurt yourself or others, or have thoughts about taking your own life. Go to your nearest emergency room or: ?Call 911. ?Call the National Suicide Prevention Lifeline at  1-800-273-8255 or 988. This is open 24 hours a day. ?Text the Crisis Text Line at 741741. ?Summary ?Myofascial pain syndrome and fibromyalgia are pain disorders. ?Myofascial pain syndrome has tender points in the muscles that will cause pain when pressed (trigger points). Fibromyalgia also has muscle pains and tenderness that come and go, but this condition is often associated with fatigue and sleep disturbances. ?Fibromyalgia and myofascial pain syndrome are not the same but often occur together, causing pain and fatigue that make day-to-day activities difficult. ?Follow your health care provider's instructions for taking medicines and maintaining a healthy lifestyle. ?This information is not intended to replace advice given to you by your health care provider. Make sure you discuss any questions you have with your health care provider. ?Document Revised: 09/06/2021 Document Reviewed: 09/06/2021 ?Elsevier Patient Education ? 2023 Elsevier Inc. ? ?

## 2022-02-15 LAB — ALLERGENS (22) FOODS IGG
Casein IgG: 7 ug/mL — ABNORMAL HIGH (ref 0.0–1.9)
Cheese, Mold Type IgG: 14.9 ug/mL — ABNORMAL HIGH (ref 0.0–1.9)
Chicken IgG: <2 ug/mL (ref 0.0–1.9)
Chili Pepper IgG: 7.2 ug/mL — ABNORMAL HIGH (ref 0.0–1.9)
Chocolate/Cacao IgG: 2.3 ug/mL — ABNORMAL HIGH (ref 0.0–1.9)
Coffee IgG: 4.1 ug/mL — ABNORMAL HIGH (ref 0.0–1.9)
Corn IgG: 4.2 ug/mL — ABNORMAL HIGH (ref 0.0–1.9)
Egg, Whole IgG: 9.8 ug/mL — ABNORMAL HIGH (ref 0.0–1.9)
Green Bean IgG: 2.4 ug/mL — ABNORMAL HIGH (ref 0.0–1.9)
Haddock IgG: <2 ug/mL (ref 0.0–1.9)
Lamb IgG: 2.2 ug/mL — ABNORMAL HIGH (ref 0.0–1.9)
Oat IgG: 14.6 ug/mL — ABNORMAL HIGH (ref 0.0–1.9)
Onion IgG: 4.1 ug/mL — ABNORMAL HIGH (ref 0.0–1.9)
Peanut IgG: 2.3 ug/mL — ABNORMAL HIGH (ref 0.0–1.9)
Pork IgG: 3 ug/mL — ABNORMAL HIGH (ref 0.0–1.9)
Potato, White, IgG: <2 ug/mL (ref 0.0–1.9)
Rye IgG: 11 ug/mL — ABNORMAL HIGH (ref 0.0–1.9)
Shrimp IgG: 4 ug/mL — ABNORMAL HIGH (ref 0.0–1.9)
Soybean IgG: <2 ug/mL (ref 0.0–1.9)
Tomato IgG: 3.5 ug/mL — ABNORMAL HIGH (ref 0.0–1.9)
Wheat IgG: 14.3 ug/mL — ABNORMAL HIGH (ref 0.0–1.9)
Yeast IgG: 7.7 ug/mL — ABNORMAL HIGH (ref 0.0–1.9)

## 2022-02-17 ENCOUNTER — Other Ambulatory Visit: Payer: BC Managed Care – PPO

## 2022-02-17 ENCOUNTER — Other Ambulatory Visit: Payer: Self-pay | Admitting: Nurse Practitioner

## 2022-02-17 DIAGNOSIS — Z91018 Allergy to other foods: Secondary | ICD-10-CM

## 2022-02-19 ENCOUNTER — Ambulatory Visit
Admission: RE | Admit: 2022-02-19 | Discharge: 2022-02-19 | Disposition: A | Discharge status: Home / Self Care | Payer: BC Managed Care – PPO | Source: Ambulatory Visit | Attending: Gastroenterology | Admitting: Gastroenterology

## 2022-02-19 ENCOUNTER — Other Ambulatory Visit: Payer: Self-pay | Admitting: Gastroenterology

## 2022-02-19 DIAGNOSIS — K5900 Constipation, unspecified: Secondary | ICD-10-CM

## 2022-02-19 DIAGNOSIS — R7989 Other specified abnormal findings of blood chemistry: Secondary | ICD-10-CM

## 2022-02-19 DIAGNOSIS — R109 Unspecified abdominal pain: Secondary | ICD-10-CM

## 2022-03-03 ENCOUNTER — Telehealth (INDEPENDENT_AMBULATORY_CARE_PROVIDER_SITE_OTHER): Payer: BC Managed Care – PPO | Admitting: Nurse Practitioner

## 2022-03-03 DIAGNOSIS — E6609 Other obesity due to excess calories: Secondary | ICD-10-CM | POA: Diagnosis not present

## 2022-03-03 DIAGNOSIS — R5383 Other fatigue: Secondary | ICD-10-CM

## 2022-03-03 DIAGNOSIS — Z6835 Body mass index (BMI) 35.0-35.9, adult: Secondary | ICD-10-CM

## 2022-03-03 DIAGNOSIS — R635 Abnormal weight gain: Secondary | ICD-10-CM

## 2022-03-03 NOTE — Patient Instructions (Signed)
Preventing Unhealthy Weight Gain, Adult  Staying at a healthy weight is important to your overall health. When fat builds up in your body, you may become overweight or obese. Being overweight or obese increases your risk of developing various health problems.  Unhealthy weight gain is often the result of making unhealthy food choices or not getting enough exercise. You can make changes to your lifestyle to prevent obesity and stay as healthy as possible.  How can unhealthy weight gain affect me?  Being overweight or obese can cause you to develop joint or bone problems, which can make it hard for you to stay active or do activities you enjoy. Being overweight also puts stress on your heart and lungs and can lead to health problems such as:  Diabetes.  Heart disease.  Some types of cancer.  Stroke.  Eating healthy, staying active, and having healthy habits can help to prevent unhealthy weight gain and lower your risk for health problems. These lifestyle changes will also help you manage stress and emotions, improve your self-esteem, and connect with friends and family.  What can increase my risk?  In addition to certain eating and lifestyle choices, some other factors that may make you more likely to have unhealthy weight gain include:  Having a family history of obesity.  Living in an area with limited access to:  Parks, recreation centers, or sidewalks.  Healthy food choices, such as grocery stores and farmers' markets.  What actions can I take to prevent unhealthy weight gain?  Nutrition    Eat only as much as your body needs. To do this:  Pay attention to signs that you are hungry or full. Stop eating as soon as you feel full.  If you feel hungry, try drinking water first before eating. Drink enough water so your urine is pale yellow.  Eat smaller portions. Pay attention to portion sizes when eating out.  Look at serving sizes on food labels. Most foods contain more than one serving per container.  Eat the  recommended number of calories for your gender and activity level. For most active people, a daily total of 2,000 calories is appropriate. If you are trying to lose weight or are not very active, you may need to eat fewer calories. Talk with your health care provider or a dietitian about how many calories you need each day.  Choose healthy foods, such as:  Fruits and vegetables. At each meal, try to fill at least half of your plate with fruits and vegetables.  Whole grains, such as whole-wheat bread, brown rice, and quinoa.  Lean meats, such as chicken or fish.  Other healthy proteins, such as beans, eggs, or tofu.  Healthy fats, such as nuts, seeds, fatty fish, and olive oil.  Low-fat or fat-free dairy products.  Check food labels, and avoid food and drinks that:  Are high in calories.  Have added sugar.  Are high in sodium.  Have saturated fats or trans fats.  Cook foods in healthier ways, such as by baking, broiling, or grilling.  Make a meal plan for the week, and shop with a grocery list to help you stay on track with your purchases. Try to avoid going to the grocery store when you are hungry.  When grocery shopping, try to shop around the outside of the store first, where the fresh foods are. Doing this helps you avoid prepackaged foods, which can be high in sugar, salt (sodium), and fat.  Lifestyle    Exercise   for 30 or more minutes on 5 or more days each week. Exercising may include brisk walking, yard work, biking, running, swimming, and team sports like basketball and soccer. Ask your health care provider which exercises are safe for you.  Do activities that strengthen the muscles, such as lifting weights or using resistance bands, on 2 or more days a week.  Do not use any products that contain nicotine or tobacco. These products include cigarettes, chewing tobacco, and vaping devices, such as e-cigarettes. If you need help quitting, ask your health care provider.  If you drink alcohol:  Limit how much you  have to:  0-1 drink a day for women who are not pregnant.  0-2 drinks a day for men.  Know how much alcohol is in a drink. In the [place] one drink equals one 12 oz bottle of beer (355 mL), one 5 oz glass of wine (148 mL), or one 1 oz glass of hard liquor (44 mL).  Try to get 7-9 hours of sleep each night.  Other changes  Keep a food and activity journal to keep track of:  What you ate and how many calories you had. Remember to count the calories in sauces, dressings, and side dishes.  Whether you were active, and what exercises you did.  Your calorie, weight, and activity goals.  Check your weight regularly. Track any changes. If you notice that you have gained weight, make changes to your diet or activity routine.  Avoid taking weight-loss medicines or supplements. Talk to your health care provider before starting any new medicine or supplement.  Talk to your health care provider before trying any new diet or exercise plan.  Where to find more information  Talk with your health care provider or a dietitian about healthy eating and healthy lifestyle choices. You may also find information from:  [place] Department of Agriculture, MyPlate: www.choosemyplate.gov  American Heart Association: www.heart.org  Centers for Disease Control and Prevention: www.cdc.gov  Summary  Eating healthy, staying active, and having healthy habits can help to prevent unhealthy weight gain and lower your risk for health problems such as heart disease, diabetes, some types of cancer, and stroke.  Being overweight or obese can cause you to develop joint or bone problems, which can make it hard for you to stay active or do activities you enjoy.  You can prevent unhealthy weight gain by eating a healthy diet, exercising regularly, not smoking, limiting alcohol, and getting enough sleep.  Talk with your health care provider or a dietitian for guidance about healthy eating and healthy lifestyle choices.  This information is not intended to replace  advice given to you by your health care provider. Make sure you discuss any questions you have with your health care provider.  Document Revised: 05/03/2021 Document Reviewed: 05/03/2021  Elsevier Patient Education  2023 Elsevier Inc.

## 2022-03-03 NOTE — Progress Notes (Signed)
Virtual Visit via MyChart   This visit type was conducted due to national recommendations for restrictions regarding the COVID-19 Pandemic (e.g. social distancing) in an effort to limit this patient's exposure and mitigate transmission in our community.  Due to her co-morbid illnesses, this patient is at least at moderate risk for complications without adequate follow up.  This format is felt to be most appropriate for this patient at this time.  All issues noted in this document were discussed and addressed.  A limited physical exam was performed with this format.    This visit type was conducted due to national recommendations for restrictions regarding the COVID-19 Pandemic (e.g. social distancing) in an effort to limit this patient's exposure and mitigate transmission in our community.  Patients identity confirmed using two different identifiers.  This format is felt to be most appropriate for this patient at this time.  All issues noted in this document were discussed and addressed.  No physical exam was performed (except for noted visual exam findings with Video Visits).    Date:  03/11/2022   ID:  Gwendolyn Nguyen, DOB Apr 02, 1986, MRN [id number]  Patient Location:  Work - spoke with [name]  Provider location:   Office    Chief Complaint:  weight gain and decreased energy  History of Present Illness:    Gwendolyn Nguyen is a 36 y.o. female who presents via video conferencing for a telehealth visit today.    The patient does not have symptoms concerning for COVID-19 infection (fever, chills, cough, or new shortness of breath).   Patient presents today for weight gain.  Post surgery - hysterectomy in 2021 - ovaries were removed as well. She began noticing weight gain a year after surgery, then she is now on estrogen and having chronic inflammation and joint pain. She is on prednisone. She also has low energy.  She is doing yoga in the morning. She is not as active as she would  normally be due to being tired. She reports having gained 20 lbs in the last 6 months. She does eat a sandwich daily. She drinks approximately 64-80 oz water. She does drink orange juice during the weekends.  She does not feel like she gets a good nights sleep, due to increased pain. Feels like she has not slept a full night in the last 1-2 years.      Past Medical History:  Diagnosis Date   Anemia    Back pain    Constipation    Endometriosis    IBS (irritable bowel syndrome)    Low hemoglobin    Multiple food allergies    PID (acute pelvic inflammatory disease) 24/5809   complicated by tuboovarian abscess   TOA (tubo-ovarian abscess) 07/24/2019   Past Surgical History:  Procedure Laterality Date   IR RADIOLOGIST EVAL & MGMT  05/19/2019   IR RADIOLOGIST EVAL & MGMT  05/24/2019   LAPAROSCOPIC LYSIS OF ADHESIONS N/A 07/24/2019   Procedure: Laparoscopic Lysis Of Adhesions, Drainage of Pelvic Abscess;  Surgeon: Servando Salina, MD;  Location: [place];  Service: Gynecology;  Laterality: N/A;   LAPAROSCOPIC LYSIS OF ADHESIONS N/A 07/24/2019   Procedure: Laparoscopic Lysis Of Adhesions and Assist With Drainage of Pelvic Abscess;  Surgeon: Georganna Skeans, MD;  Location: [place];  Service: General;  Laterality: N/A;   LAPAROSCOPY N/A 07/24/2019   Procedure: LAPAROSCOPY DIAGNOSTIC;  Surgeon: Servando Salina, MD;  Location: [place] [facility];  Service: Gynecology;  Laterality: N/A;   TOTAL ABDOMINAL HYSTERECTOMY W/ BILATERAL  SALPINGOOPHORECTOMY  04/13/2020   full ectocervix not removed  - NEEDS PAP (done at [facility] for endometriosis)     No outpatient medications have been marked as taking for the 03/03/22 encounter (Video Visit) with Minette Brine, FNP.     Allergies:   Gadolinium derivatives, Shellfish allergy, Sulfa antibiotics, Sulfamethoxazole-trimethoprim, and Iohexol   Social History   Tobacco Use   Smoking status: Never   Smokeless tobacco: Never  Vaping Use   Vaping Use: Never used  Substance  Use Topics   Alcohol use: Yes    Comment: occassional   Drug use: Never     Family Hx: The patient's family history includes Diabetes in her maternal grandmother; Heart disease in her maternal grandfather; Hyperlipidemia in her father and mother; Hypertension in her father and mother; Obesity in her father and mother. There is no history of Colon cancer, Esophageal cancer, Inflammatory bowel disease, Liver disease, Pancreatic cancer, Rectal cancer, or Stomach cancer.  ROS:   Please see the history of present illness.    Review of Systems  Constitutional: Negative.   Respiratory: Negative.    Cardiovascular: Negative.   Gastrointestinal: Negative.   Neurological: Negative.   Psychiatric/Behavioral: Negative.     All other systems reviewed and are negative.   Labs/Other Tests and Data Reviewed:    Recent Labs: 03/04/2022: Hemoglobin 11.0; Platelets 435; TSH 2.330   Recent Lipid Panel Lab Results  Component Value Date/Time   CHOL 150 11/25/2018 09:28 AM   TRIG 41 11/25/2018 09:28 AM   HDL 56 11/25/2018 09:28 AM   LDLCALC 86 11/25/2018 09:28 AM    Wt Readings from Last 3 Encounters:  02/13/22 211 lb (95.7 kg)  01/15/22 201 lb (91.2 kg)  01/02/22 200 lb 14.4 oz (91.1 kg)     Exam:    Vital Signs:  LMP 07/06/2019 (Approximate)     Physical Exam Constitutional:      General: She is not in acute distress.    Appearance: Normal appearance.  Pulmonary:     Effort: Pulmonary effort is normal. No respiratory distress.  Neurological:     General: No focal deficit present.     Mental Status: She is alert and oriented to person, place, and time. Mental status is at baseline.     Cranial Nerves: No cranial nerve deficit.  Psychiatric:        Mood and Affect: Mood and affect normal.        Behavior: Behavior normal.        Thought Content: Thought content normal.        Cognition and Memory: Memory normal.        Judgment: Judgment normal.    ASSESSMENT & PLAN:    1.  Abnormal weight gain will check for metabolic causes. May need to consider weight loss options  2. Other fatigue If metabolic labs are normal will order sleep study - TSH - Vitamin B12 - CBC with Differential/Platelet - Iron, TIBC and Ferritin Panel  3. Class 2 obesity due to excess calories without serious comorbidity with body mass index (BMI) of 35.0 to 35.9 in adult She is encouraged to strive for BMI less than 30 to decrease cardiac risk. Advised to aim for at least 150 minutes of exercise per week.    COVID-19 Education: The signs and symptoms of COVID-19 were discussed with the patient and how to seek care for testing (follow up with PCP or arrange E-visit).  The importance of social distancing was discussed today.  Patient Risk:   After full review of this patients clinical status, I feel that they are at least moderate risk at this time.  Time:  13.49 Today, I have spent  minutes/ seconds with the patient with telehealth technology discussing above diagnoses.     Medication Adjustments/Labs and Tests Ordered: Current medicines are reviewed at length with the patient today.  Concerns regarding medicines are outlined above.   Tests Ordered: Orders Placed This Encounter  Procedures   TSH   Vitamin B12   CBC with Differential/Platelet   Iron, TIBC and Ferritin Panel    Medication Changes: No orders of the defined types were placed in this encounter.   Disposition:  Follow up prn  Signed, Minette Brine, FNP

## 2022-03-04 ENCOUNTER — Other Ambulatory Visit: Payer: BC Managed Care – PPO

## 2022-03-05 ENCOUNTER — Other Ambulatory Visit: Payer: Self-pay | Admitting: Nurse Practitioner

## 2022-03-05 DIAGNOSIS — D509 Iron deficiency anemia, unspecified: Secondary | ICD-10-CM

## 2022-03-05 LAB — CBC WITH DIFFERENTIAL/PLATELET
Basophils Absolute: 0.1 10*3/uL (ref 0.0–0.2)
Basos: 1 %
EOS (ABSOLUTE): 0.6 10*3/uL — ABNORMAL HIGH (ref 0.0–0.4)
Eos: 9 %
Hematocrit: 35.5 % (ref 34.0–46.6)
Hemoglobin: 11 g/dL — ABNORMAL LOW (ref 11.1–15.9)
Immature Grans (Abs): 0 10*3/uL (ref 0.0–0.1)
Immature Granulocytes: 0 %
Lymphocytes Absolute: 1.6 10*3/uL (ref 0.7–3.1)
Lymphs: 22 %
MCH: 24.5 pg — ABNORMAL LOW (ref 26.6–33.0)
MCHC: 31 g/dL — ABNORMAL LOW (ref 31.5–35.7)
MCV: 79 fL (ref 79–97)
Monocytes Absolute: 0.4 10*3/uL (ref 0.1–0.9)
Monocytes: 5 %
Neutrophils Absolute: 4.8 10*3/uL (ref 1.4–7.0)
Neutrophils: 63 %
Platelets: 435 10*3/uL (ref 150–450)
RBC: 4.49 x10E6/uL (ref 3.77–5.28)
RDW: 14 % (ref 11.7–15.4)
WBC: 7.5 10*3/uL (ref 3.4–10.8)

## 2022-03-05 LAB — TSH: TSH: 2.33 u[IU]/mL (ref 0.450–4.500)

## 2022-03-05 LAB — IRON,TIBC AND FERRITIN PANEL
Ferritin: 13 ng/mL — ABNORMAL LOW (ref 15–150)
Iron Saturation: 7 % — CL (ref 15–55)
Iron: 26 ug/dL — ABNORMAL LOW (ref 27–159)
Total Iron Binding Capacity: 395 ug/dL (ref 250–450)
UIBC: 369 ug/dL (ref 131–425)

## 2022-03-05 LAB — VITAMIN B12: Vitamin B-12: 806 pg/mL (ref 232–1245)

## 2022-03-06 ENCOUNTER — Telehealth: Payer: Self-pay | Admitting: Oncology

## 2022-03-06 NOTE — Telephone Encounter (Signed)
Scheduled appt per 5/17 referral. Pt was already established with Dr. Alen Blew. Pt is aware of appt date and time. Pt is aware to arrive 15 mins prior to appt time and to bring and updated insurance card. Pt is aware of appt location.

## 2022-03-11 ENCOUNTER — Encounter: Payer: Self-pay | Admitting: Nurse Practitioner

## 2022-03-14 ENCOUNTER — Other Ambulatory Visit: Payer: Self-pay | Admitting: Nurse Practitioner

## 2022-03-14 ENCOUNTER — Other Ambulatory Visit: Payer: Self-pay | Admitting: Registered Nurse

## 2022-03-14 DIAGNOSIS — E6609 Other obesity due to excess calories: Secondary | ICD-10-CM

## 2022-03-14 DIAGNOSIS — E669 Obesity, unspecified: Secondary | ICD-10-CM

## 2022-03-14 MED ORDER — PHENTERMINE HCL 15 MG PO CAPS: 15.0000 mg | ORAL_CAPSULE | ORAL | 1 refills | Status: DC

## 2022-03-14 NOTE — Telephone Encounter (Signed)
Patient is requesting medication refill for Phentermine.Please Advise.

## 2022-04-21 ENCOUNTER — Ambulatory Visit: Payer: BC Managed Care – PPO | Admitting: Nurse Practitioner

## 2022-04-28 ENCOUNTER — Telehealth (INDEPENDENT_AMBULATORY_CARE_PROVIDER_SITE_OTHER): Payer: BC Managed Care – PPO | Admitting: Nurse Practitioner

## 2022-04-28 DIAGNOSIS — M2550 Pain in unspecified joint: Secondary | ICD-10-CM

## 2022-04-28 DIAGNOSIS — E6609 Other obesity due to excess calories: Secondary | ICD-10-CM | POA: Diagnosis not present

## 2022-04-28 DIAGNOSIS — Z6835 Body mass index (BMI) 35.0-35.9, adult: Secondary | ICD-10-CM | POA: Diagnosis not present

## 2022-04-28 MED ORDER — MELOXICAM 15 MG PO TABS: 15.0000 mg | ORAL_TABLET | Freq: Every day | ORAL | 1 refills | Status: DC

## 2022-04-28 MED ORDER — PHENTERMINE HCL 15 MG PO CAPS: 15.0000 mg | ORAL_CAPSULE | ORAL | 1 refills | Status: DC

## 2022-04-28 MED ORDER — DULOXETINE HCL 60 MG PO CPEP: ORAL_CAPSULE | ORAL | 1 refills | Status: DC

## 2022-04-28 NOTE — Progress Notes (Signed)
Virtual Visit via MyChart   This visit type was conducted due to national recommendations for restrictions regarding the COVID-19 Pandemic (e.g. social distancing) in an effort to limit this patient's exposure and mitigate transmission in our community.  Due to her co-morbid illnesses, this patient is at least at moderate risk for complications without adequate follow up.  This format is felt to be most appropriate for this patient at this time.  All issues noted in this document were discussed and addressed.  A limited physical exam was performed with this format.    This visit type was conducted due to national recommendations for restrictions regarding the COVID-19 Pandemic (e.g. social distancing) in an effort to limit this patient's exposure and mitigate transmission in our community.  Patients identity confirmed using two different identifiers.  This format is felt to be most appropriate for this patient at this time.  All issues noted in this document were discussed and addressed.  No physical exam was performed (except for noted visual exam findings with Video Visits).    Date:  05/27/2022   ID:  Gwendolyn Nguyen, DOB 1986-01-04, MRN [id number]  Patient Location:  Home - spoke with [name]  Provider location:   Office    Chief Complaint:  persistent fibromyalgia  History of Present Illness:    Gwendolyn Nguyen is a 36 y.o. female who presents via video conferencing for a telehealth visit today.    The patient does not have symptoms concerning for COVID-19 infection (fever, chills, cough, or new shortness of breath).   She is concerned about having more flares even with the phentermine. She is interested in going to pain management.      Past Medical History:  Diagnosis Date   Anemia    Back pain    Constipation    Endometriosis    IBS (irritable bowel syndrome)    Low hemoglobin    Multiple food allergies    PID (acute pelvic inflammatory disease) 05/6577    complicated by tuboovarian abscess   TOA (tubo-ovarian abscess) 07/24/2019   Past Surgical History:  Procedure Laterality Date   IR RADIOLOGIST EVAL & MGMT  05/19/2019   IR RADIOLOGIST EVAL & MGMT  05/24/2019   LAPAROSCOPIC LYSIS OF ADHESIONS N/A 07/24/2019   Procedure: Laparoscopic Lysis Of Adhesions, Drainage of Pelvic Abscess;  Surgeon: Servando Salina, MD;  Location: [place];  Service: Gynecology;  Laterality: N/A;   LAPAROSCOPIC LYSIS OF ADHESIONS N/A 07/24/2019   Procedure: Laparoscopic Lysis Of Adhesions and Assist With Drainage of Pelvic Abscess;  Surgeon: Georganna Skeans, MD;  Location: [place];  Service: General;  Laterality: N/A;   LAPAROSCOPY N/A 07/24/2019   Procedure: LAPAROSCOPY DIAGNOSTIC;  Surgeon: Servando Salina, MD;  Location: [place];  Service: Gynecology;  Laterality: N/A;   TOTAL ABDOMINAL HYSTERECTOMY W/ BILATERAL SALPINGOOPHORECTOMY  04/13/2020   full ectocervix not removed  - NEEDS PAP (done at [facility] for endometriosis)     Current Meds  Medication Sig   gabapentin (NEURONTIN) 100 MG capsule Take 1 capsule (100 mg total) by mouth daily. Take with 300 mg tab total 400 mg TID   gabapentin (NEURONTIN) 300 MG capsule Take 1 capsule (300 mg total) by mouth 3 (three) times daily.   ketorolac (TORADOL) 10 MG tablet Take 1 tablet (10 mg total) by mouth every 6 (six) hours as needed.   norethindrone (MICRONOR) 0.35 MG tablet 1 tablet   traMADol (ULTRAM) 50 MG tablet Take 1 tablet (50 mg total) by mouth every  6 (six) hours as needed.   [DISCONTINUED] cyclobenzaprine (FLEXERIL) 5 MG tablet Take 5 mg by mouth 3 (three) times daily as needed for muscle spasms.   [DISCONTINUED] diphenhydrAMINE (BENADRYL) 50 MG tablet Take 1 tablet (50 mg total) by mouth once for 1 dose.   [DISCONTINUED] DULoxetine (CYMBALTA) 30 MG capsule TAKE 1 CAPSULE BY MOUTH EVERY DAY   [DISCONTINUED] meloxicam (MOBIC) 15 MG tablet Take 1 tablet (15 mg total) by mouth daily. Take as needed   [DISCONTINUED]  phentermine 15 MG capsule Take 1 capsule (15 mg total) by mouth every morning.     Allergies:   Beef-derived products, Gadolinium derivatives, Other, Shellfish allergy, Sulfa antibiotics, Sulfamethoxazole-trimethoprim, and Iohexol   Social History   Tobacco Use   Smoking status: Never   Smokeless tobacco: Never  Vaping Use   Vaping Use: Never used  Substance Use Topics   Alcohol use: Yes    Comment: occassional   Drug use: Never     Family Hx: The patient's family history includes Diabetes in her maternal grandmother; Heart disease in her maternal grandfather; Hyperlipidemia in her father and mother; Hypertension in her father and mother; Obesity in her father and mother. There is no history of Colon cancer, Esophageal cancer, Inflammatory bowel disease, Liver disease, Pancreatic cancer, Rectal cancer, or Stomach cancer.  ROS:   Please see the history of present illness.    Review of Systems  Constitutional: Negative.   Respiratory: Negative.    Cardiovascular: Negative.   Musculoskeletal:  Positive for joint pain and myalgias.  Neurological: Negative.   Psychiatric/Behavioral: Negative.      All other systems reviewed and are negative.   Labs/Other Tests and Data Reviewed:    Recent Labs: 03/04/2022: Hemoglobin 11.0; Platelets 435; TSH 2.330   Recent Lipid Panel Lab Results  Component Value Date/Time   CHOL 150 11/25/2018 09:28 AM   TRIG 41 11/25/2018 09:28 AM   HDL 56 11/25/2018 09:28 AM   LDLCALC 86 11/25/2018 09:28 AM    Wt Readings from Last 3 Encounters:  04/30/22 205 lb (93 kg)  04/29/22 204 lb 3.2 oz (92.6 kg)  02/13/22 211 lb (95.7 kg)     Exam:    Vital Signs:  LMP 07/06/2019 (Approximate)     Physical Exam Constitutional:      General: She is not in acute distress.    Appearance: Normal appearance.  Pulmonary:     Effort: Pulmonary effort is normal. No respiratory distress.  Neurological:     General: No focal deficit present.     Mental  Status: She is alert and oriented to person, place, and time. Mental status is at baseline.     Cranial Nerves: No cranial nerve deficit.  Psychiatric:        Mood and Affect: Mood and affect normal.        Behavior: Behavior normal.        Thought Content: Thought content normal.        Cognition and Memory: Memory normal.        Judgment: Judgment normal.     ASSESSMENT & PLAN:    1. Arthralgia, unspecified joint Will continue duloxetine and refer to pain clinic to see any other options.  - DULoxetine (CYMBALTA) 60 MG capsule; TAKE 1 CAPSULE BY MOUTH EVERY DAY  Dispense: 90 capsule; Refill: 1 - Ambulatory referral to Pain Clinic  2. Class 2 obesity due to excess calories without serious comorbidity with body mass index (BMI) of 35.0 to  35.9 in adult Continue phentermine, tolerating well. Goal to lose 1-2 lbs a week.  - phentermine 15 MG capsule; Take 1 capsule (15 mg total) by mouth every morning.  Dispense: 30 capsule; Refill: 1   COVID-19 Education: The signs and symptoms of COVID-19 were discussed with the patient and how to seek care for testing (follow up with PCP or arrange E-visit).  The importance of social distancing was discussed today.  Patient Risk:   After full review of this patients clinical status, I feel that they are at least moderate risk at this time.  Time:   Today, I have spent 10 minutes/ seconds with the patient with telehealth technology discussing above diagnoses.     Medication Adjustments/Labs and Tests Ordered: Current medicines are reviewed at length with the patient today.  Concerns regarding medicines are outlined above.   Tests Ordered: Orders Placed This Encounter  Procedures   Ambulatory referral to Pain Clinic    Medication Changes: Meds ordered this encounter  Medications   DULoxetine (CYMBALTA) 60 MG capsule    Sig: TAKE 1 CAPSULE BY MOUTH EVERY DAY    Dispense:  90 capsule    Refill:  1   DISCONTD: meloxicam (MOBIC) 15 MG  tablet    Sig: Take 1 tablet (15 mg total) by mouth daily.    Dispense:  90 tablet    Refill:  1   phentermine 15 MG capsule    Sig: Take 1 capsule (15 mg total) by mouth every morning.    Dispense:  30 capsule    Refill:  1    Disposition:  Follow up prn  Signed, Minette Brine, FNP

## 2022-04-28 NOTE — Patient Instructions (Signed)

## 2022-04-29 ENCOUNTER — Other Ambulatory Visit: Payer: Self-pay

## 2022-04-29 ENCOUNTER — Inpatient Hospital Stay: Payer: BC Managed Care – PPO | Attending: Oncology | Admitting: Oncology

## 2022-04-29 VITALS — BP 150/101 | HR 72 | Temp 98.0°F | Resp 18 | Ht 64.4 in | Wt 204.2 lb

## 2022-04-29 DIAGNOSIS — D509 Iron deficiency anemia, unspecified: Secondary | ICD-10-CM | POA: Insufficient documentation

## 2022-04-29 NOTE — Progress Notes (Signed)
Hematology and Oncology Follow Up Visit  Gwendolyn Nguyen [id number] Dec 19, 1985 36 y.o. 04/29/2022 8:26 AM Minette Brine, FNPMoore, [place], FNP   Principle Diagnosis: 36 year old woman with iron deficiency anemia diagnosed in May 2023.  She was found to have iron of 26, ferritin of 13 and saturation of 7%.     Current therapy: Under evaluation for iron replacement.  Interim History: Gwendolyn Nguyen returns today for a follow-up.  Since last visit, she reports no major complaints.  She has reported some occasional fatigue and tiredness and periodic bruising.  She has been on oral iron therapy in the past although tolerating it poorly.  She reported nausea and constipation.  She denies any hospitalizations or illnesses.  She denies any hematochezia or melena.  She is currently evaluated by gastroenterology.     Medications: I have reviewed the patient's current medications.  Current Outpatient Medications  Medication Sig Dispense Refill   diphenhydrAMINE (BENADRYL) 50 MG tablet Take 1 tablet (50 mg total) by mouth once for 1 dose. 1 tablet 0   DULoxetine (CYMBALTA) 60 MG capsule TAKE 1 CAPSULE BY MOUTH EVERY DAY 90 capsule 1   gabapentin (NEURONTIN) 100 MG capsule Take 1 capsule (100 mg total) by mouth daily. Take with 300 mg tab total 400 mg TID 90 capsule 1   gabapentin (NEURONTIN) 300 MG capsule Take 1 capsule (300 mg total) by mouth 3 (three) times daily. 90 capsule 2   ketorolac (TORADOL) 10 MG tablet Take 1 tablet (10 mg total) by mouth every 6 (six) hours as needed. 20 tablet 2   norethindrone (MICRONOR) 0.35 MG tablet 1 tablet     phentermine 15 MG capsule Take 1 capsule (15 mg total) by mouth every morning. 30 capsule 1   traMADol (ULTRAM) 50 MG tablet Take 1 tablet (50 mg total) by mouth every 6 (six) hours as needed. 30 tablet 1   No current facility-administered medications for this visit.     Allergies:  Allergies  Allergen Reactions   Gadolinium Derivatives Hives, Itching  and Nausea And Vomiting    Pt vomited immediately during injection.  15 minutes later, pt developed hives and itching.    Shellfish Allergy Hives   Sulfa Antibiotics Other (See Comments)    Patient was told to NOT TAKE THIS   Sulfamethoxazole-Trimethoprim Other (See Comments)    Other reaction(s): hives   Iohexol Hives and Rash    08-2018, rash and hives, needs pre meds S/W PATIENT STATED REACTION FROM CT CONTRAST 11/19     Physical Exam: Blood pressure (!) 150/101, pulse 72, temperature 98 F (36.7 C), temperature source Temporal, resp. rate 18, height 5' 4.4" (1.636 m), weight 204 lb 3.2 oz (92.6 kg), last menstrual period 07/06/2019, SpO2 100 %.  ECOG: 0     General appearance: Comfortable appearing without any discomfort Head: Normocephalic without any trauma Oropharynx: Mucous membranes are moist and pink without any thrush or ulcers. Eyes: Pupils are equal and round reactive to light. Lymph nodes: No cervical, supraclavicular, inguinal or axillary lymphadenopathy.   Heart:regular rate and rhythm.  S1 and S2 without leg edema. Lung: Clear without any rhonchi or wheezes.  No dullness to percussion. Abdomin: Soft, nontender, nondistended with good bowel sounds.  No hepatosplenomegaly. Musculoskeletal: No joint deformity or effusion.  Full range of motion noted. Neurological: No deficits noted on motor, sensory and deep tendon reflex exam. Skin: No petechial rash or dryness.  Appeared moist.  Psychiatric: Mood and affect appeared appropriate.    Lab  Results: Lab Results  Component Value Date   WBC 7.5 03/04/2022   HGB 11.0 (L) 03/04/2022   HCT 35.5 03/04/2022   MCV 79 03/04/2022   PLT 435 03/04/2022     Chemistry      Component Value Date/Time   NA 133 (L) 02/21/2020 1109   NA 138 12/14/2019 1052   K 4.1 02/21/2020 1109   CL 100 02/21/2020 1109   CO2 29 02/21/2020 1109   BUN 10 02/21/2020 1109   BUN 9 12/14/2019 1052   CREATININE 1.03 02/21/2020 1109       Component Value Date/Time   CALCIUM 9.2 02/21/2020 1109   ALKPHOS 78 02/21/2020 1109   AST 116 (H) 02/21/2020 1109   ALT 326 (H) 02/21/2020 1109   BILITOT 0.4 02/21/2020 1109   BILITOT 0.4 12/14/2019 1052       Impression and Plan:  36 year old with:  1.  Iron deficiency anemia diagnosed in May 2023.  Her hemoglobin was 11 with iron level of 26.  This is presumably related to chronic menstrual blood losses as well as poor iron absorption.  Treatment choices were discussed at this time.  Oral iron replacement versus intravenous iron were discussed at this time.  GI toxicity associated with oral iron versus infusion related complications with IV iron were discussed.  After discussion today, she elected to proceed with IV iron therapy given her poor tolerance to previous oral iron therapy.  We will utilize Venofer for a total of 800 mg total dose.  2.  Ecchymosis: No evidence of a hematological disorder likely related to previous prednisone exposure.  3.  Follow-up: In 6 months for repeat follow-up.  30  minutes were spent on this encounter.  The time was dedicated to updating her disease status, treatment choices and addressing treatment options.     Zola Button, MD 7/11/20238:26 AM

## 2022-04-30 ENCOUNTER — Ambulatory Visit: Payer: BC Managed Care – PPO | Admitting: Allergy

## 2022-04-30 ENCOUNTER — Encounter: Payer: Self-pay | Admitting: Allergy

## 2022-04-30 VITALS — BP 130/74 | HR 77 | Temp 97.8°F | Resp 16 | Ht 64.5 in | Wt 205.0 lb

## 2022-04-30 DIAGNOSIS — T50905D Adverse effect of unspecified drugs, medicaments and biological substances, subsequent encounter: Secondary | ICD-10-CM

## 2022-04-30 DIAGNOSIS — R0602 Shortness of breath: Secondary | ICD-10-CM | POA: Diagnosis not present

## 2022-04-30 DIAGNOSIS — L508 Other urticaria: Secondary | ICD-10-CM

## 2022-04-30 DIAGNOSIS — T781XXD Other adverse food reactions, not elsewhere classified, subsequent encounter: Secondary | ICD-10-CM

## 2022-04-30 DIAGNOSIS — T781XXA Other adverse food reactions, not elsewhere classified, initial encounter: Secondary | ICD-10-CM | POA: Diagnosis not present

## 2022-04-30 DIAGNOSIS — T783XXD Angioneurotic edema, subsequent encounter: Secondary | ICD-10-CM

## 2022-04-30 MED ORDER — EPINEPHRINE 0.3 MG/0.3ML IJ SOAJ
0.3000 mg | INTRAMUSCULAR | 1 refills | Status: DC | PRN
Start: 2022-04-30 — End: 2022-12-08

## 2022-04-30 MED ORDER — ALBUTEROL SULFATE HFA 108 (90 BASE) MCG/ACT IN AERS: 2.0000 | INHALATION_SPRAY | Freq: Four times a day (QID) | RESPIRATORY_TRACT | 2 refills | Status: DC | PRN

## 2022-04-30 NOTE — Progress Notes (Signed)
New Patient Note  RE: Gwendolyn Nguyen MRN: [id number] DOB: 1986-03-06 Date of Office Visit: 04/30/2022  Primary care provider: Minette Brine, FNP  Chief Complaint: body pain  History of present illness: Gwendolyn Nguyen is a 36 y.o. female presenting today for evaluation of food allergy from her PCP.   She states she has been having "flares" that include symptoms of hives if its a "food allergy flare".  She also states she has flares where she has increased inflammation in her body with joint aches and pains.  She states she has been hospitalized for this inflammation.  She is trying to figure out what is the cause of this pain.  She states she has had testing for autoimmune disease and states further testing has been negative when she has gone to specialist including rheumatology.  At this time she states she is essentially trying to rule out other options like allergies as to a cause of her continued inflammation and pain.  The  She notes shellfish has caused hives, itching and sometimes throat involvement.  She states for high school graduation 2007 she had hibachi and had a reaction that was "super severe".   First time she had shellfish reaction.  She states after that she could have symptoms even in being in the room with it being cooked.   She states now it seems to be less of an issue as she can be around seafood being cooked without any symptom onset.  She has eaten fish without issue.  She has had artificial crab meat without issue however this is mostly fish.  She is avoiding shellfish mostly.  She does not currently have an epinephrine device.  She states she has had random hives that she has no clue what is triggering it.  It happens on a weekly basis.  She reports having some hives yesterday.  She feels she has been having random hives since 2007 .   Has taken benadryl when she has hives.  They are short lasting.  Do not leave behind any marks or bruising.  She has not had any  preceding illness, no new foods, no new medications, no sting's, no change in soaps/detergents/body products.    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  So she believes she is reacting to something at the school.    She notes some medications she has had reactions to. She states she was told during a past hospitalizations to avoid self antibiotics as she had a reaction.  She is not sure what this reaction was but has stayed away from it.  She has had at least 2 imaging studies with contrast which she has had reactions.  1 was with gadolinium where she recalls having immediate vomiting as well as hives and itching.  Another study was back in 2019 and per her allergy list it was 2 iohexol where she also states she had hives and itching.     She also states there are times where she feels short of breath and it feels like "can't expand lung capacity in its entirety".   She has seen cardiologist for this issue without concern for cardiac issue.  She has not tried use of any inhalers at this time.  She has no history of asthma.  Her PCP did to this food allergy panel. Component     Latest Ref Rng 02/13/2022  Wheat IgG  0.0 - 1.9 ug/mL 14.3 (H)   Rye IgG     0.0 - 1.9 ug/mL 11.0 (H)   Oat IgG     0.0 - 1.9 ug/mL 14.6 (H)   Corn IgG     0.0 - 1.9 ug/mL 4.2 (H)   Peanut IgG     0.0 - 1.9 ug/mL 2.3 (H)   Soybean IgG     0.0 - 1.9 ug/mL <2.0   Shrimp IgG     0.0 - 1.9 ug/mL 4.0 (H)   Tomato IgG     0.0 - 1.9 ug/mL 3.5 (H)   Pork IgG     0.0 - 1.9 ug/mL 3.0 (H)   Potato, White, IgG     0.0 - 1.9 ug/mL <2.0   Haddock IgG     0.0 - 1.9 ug/mL <2.0   Yeast IgG     0.0 - 1.9 ug/mL 7.7 (H)   Onion IgG     0.0 - 1.9 ug/mL 4.1 (H)   Casein IgG     0.0 - 1.9 ug/mL 7.0 (H)   Cheese, Mold Type IgG     0.0 - 1.9 ug/mL 14.9 (H)   Chicken IgG     0.0 - 1.9 ug/mL <2.0   Lamb IgG     0.0 - 1.9  ug/mL 2.2 (H)   Chocolate/Cacao IgG     0.0 - 1.9 ug/mL 2.3 (H)   Coffee IgG     0.0 - 1.9 ug/mL 4.1 (H)   Egg, Whole IgG     0.0 - 1.9 ug/mL 9.8 (H)   Chili Pepper IgG     0.0 - 1.9 ug/mL 7.2 (H)   Green Bean IgG     0.0 - 1.9 ug/mL 2.4 (H)    Review of systems: Review of Systems  Constitutional: Negative.   HENT: Negative.    Eyes: Negative.   Respiratory:  Positive for shortness of breath.   Cardiovascular: Negative.   Gastrointestinal: Negative.   Musculoskeletal:  Positive for arthralgias.  Skin:  Positive for rash.  Allergic/Immunologic: Negative.   Neurological: Negative.     All other systems negative unless noted above in HPI  Past medical history: Past Medical History:  Diagnosis Date   Anemia    Back pain    Constipation    Endometriosis    IBS (irritable bowel syndrome)    Low hemoglobin    Multiple food allergies    PID (acute pelvic inflammatory disease) 63/8756   complicated by tuboovarian abscess   TOA (tubo-ovarian abscess) 07/24/2019    Past surgical history: Past Surgical History:  Procedure Laterality Date   IR RADIOLOGIST EVAL & MGMT  05/19/2019   IR RADIOLOGIST EVAL & MGMT  05/24/2019   LAPAROSCOPIC LYSIS OF ADHESIONS N/A 07/24/2019   Procedure: Laparoscopic Lysis Of Adhesions, Drainage of Pelvic Abscess;  Surgeon: Servando Salina, MD;  Location: [place];  Service: Gynecology;  Laterality: N/A;   LAPAROSCOPIC LYSIS OF ADHESIONS N/A 07/24/2019   Procedure: Laparoscopic Lysis Of Adhesions and Assist With Drainage of Pelvic Abscess;  Surgeon: Georganna Skeans, MD;  Location: [place];  Service: General;  Laterality: N/A;   LAPAROSCOPY N/A 07/24/2019   Procedure: LAPAROSCOPY DIAGNOSTIC;  Surgeon: Servando Salina, MD;  Location: [place];  Service: Gynecology;  Laterality: N/A;   TOTAL ABDOMINAL HYSTERECTOMY W/ BILATERAL SALPINGOOPHORECTOMY  04/13/2020   full ectocervix not removed  - NEEDS PAP ([facility] at [facility]osis)    Family history:   Family History  Problem Relation Age of Onset   Hyperlipidemia Mother    Hypertension Mother    Obesity Mother    Hyperlipidemia Father    Hypertension Father    Obesity Father    Diabetes Maternal Grandmother    Heart disease Maternal Grandfather    Colon cancer Neg Hx    Esophageal cancer Neg Hx    Inflammatory bowel disease Neg Hx    Liver disease Neg Hx    Pancreatic cancer Neg Hx    Rectal cancer Neg Hx    Stomach cancer Neg Hx     Social history: Social History   Socioeconomic History   Marital status: Single    Spouse name: Not on file   Number of children: Not on file   Years of education: Not on file   Highest education level: Not on file  Occupational History   Occupation: Counselor  Tobacco Use   Smoking status: Never   Smokeless tobacco: Never  Vaping Use   Vaping Use: Never used  Substance and Sexual Activity   Alcohol use: Yes    Comment: occassional   Drug use: Never   Sexual activity: Yes    Birth control/protection: None  Other Topics Concern   Not on file  Social History Narrative   Not on file   Social Determinants of Health   Financial Resource Strain: Not on file  Food Insecurity: Not on file  Transportation Needs: Not on file  Physical Activity: Not on file  Stress: Not on file  Social Connections: Not on file  Intimate Partner Violence: Not on file    Medication List: Current Outpatient Medications  Medication Sig Dispense Refill   albuterol (VENTOLIN HFA) 108 (90 Base) MCG/ACT inhaler Inhale 2 puffs into the lungs every 6 (six) hours as needed for wheezing or shortness of breath. 8 g 2   DULoxetine (CYMBALTA) 60 MG capsule TAKE 1 CAPSULE BY MOUTH EVERY DAY 90 capsule 1   EPINEPHrine (EPIPEN 2-PAK) 0.3 mg/0.3 mL IJ SOAJ injection Inject 0.3 mg into the muscle as needed for anaphylaxis. 1 each 1   gabapentin (NEURONTIN) 100 MG capsule Take 1 capsule (100 mg total) by mouth daily. Take with 300 mg tab total 400 mg TID 90 capsule 1    gabapentin (NEURONTIN) 300 MG capsule Take 1 capsule (300 mg total) by mouth 3 (three) times daily. 90 capsule 2   ketorolac (TORADOL) 10 MG tablet Take 1 tablet (10 mg total) by mouth every 6 (six) hours as needed. 20 tablet 2   norethindrone (MICRONOR) 0.35 MG tablet 1 tablet     phentermine 15 MG capsule Take 1 capsule (15 mg total) by mouth every morning. 30 capsule 1   traMADol (ULTRAM) 50 MG tablet Take 1 tablet (50 mg total) by mouth every 6 (six) hours as needed. 30 tablet 1   No current facility-administered medications for this visit.    Known medication allergies: Allergies  Allergen Reactions   Gadolinium Derivatives Hives, Itching and Nausea And Vomiting    Pt vomited immediately during injection.  15 minutes later, pt developed hives and itching.    Shellfish Allergy Hives   Sulfa Antibiotics Other (See Comments)    Patient was told to NOT TAKE THIS   Sulfamethoxazole-Trimethoprim Other (See Comments)    Other reaction(s): hives   Iohexol Hives and Rash    08-2018, rash and hives, needs pre meds S/W PATIENT STATED REACTION FROM CT CONTRAST 11/19     Physical examination: Blood  pressure 130/74, pulse 77, temperature 97.8 F (36.6 C), resp. rate 16, height 5' 4.5" (1.638 m), weight 205 lb (93 kg), last menstrual period 07/06/2019, SpO2 99 %.  General: Alert, interactive, in no acute distress. HEENT: PERRLA, TMs pearly gray, turbinates non-edematous without discharge, post-pharynx non erythematous. Neck: Supple without lymphadenopathy. Lungs: Clear to auscultation without wheezing, rhonchi or rales. {no increased work of breathing. CV: Normal S1, S2 without murmurs. Abdomen: Nondistended, nontender. Skin: Warm and dry, without lesions or rashes. Extremities:  No clubbing, cyanosis or edema. Neuro:   Grossly intact.  Diagnositics/Labs: Labs:  Component     Latest Ref Rng 02/13/2022 03/04/2022  WBC     3.4 - 10.8 x10E3/uL  7.5   RBC     3.77 - 5.28 x10E6/uL   4.49   Hemoglobin     11.1 - 15.9 g/dL  11.0 (L)   HCT     34.0 - 46.6 %  35.5   MCV     79 - 97 fL  79   MCH     26.6 - 33.0 pg  24.5 (L)   MCHC     31.5 - 35.7 g/dL  31.0 (L)   RDW     11.7 - 15.4 %  14.0   Platelets     150 - 450 x10E3/uL  435   Neutrophils     Not Estab. %  63   Lymphs     Not Estab. %  22   Monocytes     Not Estab. %  5   Eos     Not Estab. %  9   Basos     Not Estab. %  1   NEUT#     1.4 - 7.0 x10E3/uL  4.8   Lymphocyte #     0.7 - 3.1 x10E3/uL  1.6   Monocytes Absolute     0.1 - 0.9 x10E3/uL  0.4   EOS (ABSOLUTE)     0.0 - 0.4 x10E3/uL  0.6 (H)   Basophils Absolute     0.0 - 0.2 x10E3/uL  0.1   Immature Granulocytes     Not Estab. %  0   Immature Grans (Abs)     0.0 - 0.1 x10E3/uL  0.0   TSH     0.450 - 4.500 uIU/mL  2.330   Vitamin B12     232 - 1,245 pg/mL  806    CMP from 12/09/2021 Sodium 135 - 146 MMOL/L 138   Potassium 3.5 - 5.3 MMOL/L 5.0   Chloride 98 - 110 MMOL/L 100   CO2 23 - 30 MMOL/L 29   BUN 8 - 24 MG/DL 19   Glucose 70 - 99 MG/DL 90   Comment: Patients taking eltrombopag at doses >/= 100 mg daily may show falsely elevated values of 10% or greater.  Creatinine 0.50 - 1.50 MG/DL 1.20   Calcium 8.5 - 10.5 MG/DL 10.0   Total Protein 6.0 - 8.3 G/DL 7.6   Comment: Patients taking eltrombopag at doses >/= 100 mg daily may show falsely elevated values of 10% or greater.  Albumin  3.5 - 5.0 G/DL 4.4   Total Bilirubin 0.1 - 1.2 MG/DL 0.4   Comment: Patients taking eltrombopag at doses >/= 100 mg daily may show falsely elevated values of 10% or greater.  Alkaline Phosphatase 25 - 125 IU/L or U/L 81   AST (SGOT) 5 - 40 IU/L or U/L 12   ALT (SGPT) 5 - 50 IU/L  or U/L 10   Anion Gap 4 - 14 MMOL/L 9   Est. GFR >=60 ML/MIN/1.73 M*2 61    Spirometry: FEV1: 1.7L 62%, FVC: 1.81L 55% predicted.  The study did not meet ATS criteria in regards to length of breath pursed blow cycle.  This is not a reliable study.    Assessment and  plan:   Adverse food reaction -History of shellfish allergy -We will obtain shellfish IgE panel at this time -Discussed that the IgG food panel done by her PCP is not the appropriate food allergy testing for IgE mediated allergy.  IgG is essentially a memory antibody so all that can be set from an IgG panel is that she has been exposed to the foods with positive values.  Thus the foods on the panel that have a positive result do not represent an allergic food. -Since she does still avoid shellfish for the most part would recommend access to an epinephrine device until we can determine if she no longer has a shellfish allergy.  - We have discussed the following in regards to foods:   Allergy: food allergy is when you have eaten a food, developed an allergic reaction after eating the food and have IgE to the food (positive food testing either by skin testing or blood testing).  Food allergy could lead to life threatening symptoms  Sensitivity: occurs when you have IgE to a food (positive food testing either by skin testing or blood testing) but is a food you eat without any issues.  This is not an allergy and we recommend keeping the food in the diet  Intolerance: this is when you have negative testing by either skin testing or blood testing thus not allergic but the food causes symptoms (like belly pain, bloating, diarrhea etc) with ingestion.  These foods should be avoided to prevent symptoms.    Chronic hives and swelling  - at this time etiology of hives and swelling is unknown.  Hives can be caused by a variety of different triggers including illness/infection, foods, medications, stings, exercise, pressure, vibrations, extremes of temperature to name a few however majority of the time there is no identifiable trigger.  Your symptoms have been ongoing for >6 weeks making this chronic thus will obtain labwork to evaluate: tryptase, hive panel, environmental panel, alpha-gal panel  - For hive control  recommend she start with a daily long-acting antihistamine like Zyrtec or Allegra.  If you still have hives on daily dosing then increase to twice a day dosing.  If you have hives still on twice a day dosing then add in Pepcid 1 tab twice a day.  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  Discussed there are premedication regimens for contrasted studies if she is not needing something emergent -Continue to avoid sulfa antibiotics  Shortness of breath -Spirometry done today however did not meet criteria for the study in regards to length of breaths per study -Recommend that she use albuterol when she is short of breath to see if this relieves symptoms or not.   Albuterol inhaler 2 puffs  every 4-6 hours as needed for cough/wheeze/shortness of breath/chest tightness.  May use 15-20 minutes prior to activity.   Monitor frequency of use.   She was provided with demonstration on how to use the inhaler  Follow-up in 3 months or sooner if needed  I appreciate the opportunity to take part in [place] care. Please do not hesitate to contact me with questions.  Sincerely,   Prudy Feeler, MD Allergy/Immunology [facility]

## 2022-04-30 NOTE — Patient Instructions (Addendum)
Adverse food reaction -History of shellfish allergy -We will obtain shellfish IgE panel at this time -Discussed that the IgG food panel done by her PCP is not the appropriate food allergy testing for IgE mediated allergy.  IgG is essentially a memory antibody so all that can be set from an IgG panel is that she has been exposed to the foods with positive values.  Thus the foods on the panel that have a positive result do not represent an allergic food. -Since she does still avoid shellfish for the most part would recommend access to an epinephrine device until we can determine if she no longer has a shellfish allergy.  - We have discussed the following in regards to foods:   Allergy: food allergy is when you have eaten a food, developed an allergic reaction after eating the food and have IgE to the food (positive food testing either by skin testing or blood testing).  Food allergy could lead to life threatening symptoms  Sensitivity: occurs when you have IgE to a food (positive food testing either by skin testing or blood testing) but is a food you eat without any issues.  This is not an allergy and we recommend keeping the food in the diet  Intolerance: this is when you have negative testing by either skin testing or blood testing thus not allergic but the food causes symptoms (like belly pain, bloating, diarrhea etc) with ingestion.  These foods should be avoided to prevent symptoms.    Chronic hives and swelling  - at this time etiology of hives and swelling is unknown.  Hives can be caused by a variety of different triggers including illness/infection, foods, medications, stings, exercise, pressure, vibrations, extremes of temperature to name a few however majority of the time there is no identifiable trigger.  Your symptoms have been ongoing for >6 weeks making this chronic thus will obtain labwork to evaluate: tryptase, hive panel, environmental panel, alpha-gal panel  - For hive control recommend  she start with a daily long-acting antihistamine like Zyrtec or Allegra.  If you still have hives on daily dosing then increase to twice a day dosing.  If you have hives still on twice a day dosing then add in Pepcid 1 tab twice a day.  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  Discussed there are premedication regimens for contrasted studies if she is not needing something emergent -Continue to avoid sulfa antibiotics  Shortness of breath -Spirometry done today however did not meet criteria for the study in regards to length of breaths per study -Recommend that she use albuterol when she is short of breath to see if this relieves symptoms or not.   Albuterol inhaler 2 puffs every 4-6 hours as needed for cough/wheeze/shortness of breath/chest tightness.  May use 15-20 minutes prior to activity.   Monitor frequency of use.   She was provided with demonstration on how to use the inhaler  Follow-up in 3 months or sooner if needed

## 2022-05-01 ENCOUNTER — Telehealth: Payer: Self-pay | Admitting: Oncology

## 2022-05-01 NOTE — Telephone Encounter (Signed)
Scheduled per 07/11 los, patient has been called and notified of upcoming appointments.

## 2022-05-07 ENCOUNTER — Inpatient Hospital Stay: Payer: BC Managed Care – PPO

## 2022-05-07 ENCOUNTER — Other Ambulatory Visit: Payer: Self-pay

## 2022-05-07 VITALS — BP 133/100 | HR 93 | Temp 98.6°F | Resp 18

## 2022-05-07 DIAGNOSIS — D509 Iron deficiency anemia, unspecified: Secondary | ICD-10-CM | POA: Diagnosis not present

## 2022-05-07 MED ORDER — SODIUM CHLORIDE 0.9 % IV SOLN: Freq: Once | INTRAVENOUS | Status: AC

## 2022-05-07 MED ORDER — SODIUM CHLORIDE 0.9 % IV SOLN
200.0000 mg | Freq: Once | INTRAVENOUS | Status: AC
  Administered 2022-05-07: 200 mg via INTRAVENOUS
  Filled 2022-05-07: qty 200

## 2022-05-07 NOTE — Patient Instructions (Signed)

## 2022-05-07 NOTE — Progress Notes (Signed)
Tolerated first dose of Iron well, stayed 30 minute post observation period. VSS, no signs of distress noted upon discharge.

## 2022-05-08 LAB — ALLERGEN PROFILE, SHELLFISH
Clam IgE: 0.25 kU/L — AB
F023-IgE Crab: 2.36 kU/L — AB
F080-IgE Lobster: 0.62 kU/L — AB
F290-IgE Oyster: <0.1 kU/L
Scallop IgE: 0.12 kU/L — AB
Shrimp IgE: 1.4 kU/L — AB

## 2022-05-08 LAB — ALPHA-GAL PANEL
Allergen Lamb IgE: 0.18 kU/L — AB
Beef IgE: 0.26 kU/L — AB
IgE (Immunoglobulin E), Serum: 682 IU/mL — ABNORMAL HIGH (ref 6–495)
O215-IgE Alpha-Gal: <0.1 kU/L
Pork IgE: <0.1 kU/L

## 2022-05-08 LAB — THYROID ANTIBODIES
Thyroglobulin Antibody: <1 IU/mL (ref 0.0–0.9)
Thyroperoxidase Ab SerPl-aCnc: <9 IU/mL (ref 0–34)

## 2022-05-08 LAB — TRYPTASE: Tryptase: 4.2 ug/L (ref 2.2–13.2)

## 2022-05-08 LAB — CHRONIC URTICARIA: cu index: 8.1 (ref <10)

## 2022-05-08 LAB — SEDIMENTATION RATE: Sed Rate: 28 mm/hr (ref 0–32)

## 2022-05-14 ENCOUNTER — Other Ambulatory Visit: Payer: Self-pay

## 2022-05-14 ENCOUNTER — Inpatient Hospital Stay: Payer: BC Managed Care – PPO

## 2022-05-14 VITALS — BP 132/99 | HR 87 | Temp 98.2°F | Resp 16

## 2022-05-14 DIAGNOSIS — D509 Iron deficiency anemia, unspecified: Secondary | ICD-10-CM | POA: Diagnosis not present

## 2022-05-14 MED ORDER — SODIUM CHLORIDE 0.9 % IV SOLN
200.0000 mg | Freq: Once | INTRAVENOUS | Status: AC
  Administered 2022-05-14: 200 mg via INTRAVENOUS
  Filled 2022-05-14: qty 200

## 2022-05-14 MED ORDER — SODIUM CHLORIDE 0.9 % IV SOLN: Freq: Once | INTRAVENOUS | Status: AC

## 2022-05-14 NOTE — Patient Instructions (Signed)

## 2022-05-14 NOTE — Progress Notes (Signed)
Patient remained for 30 minute observation after iron with no adverse reaction. Vitals stable, patient in no distress upon leaving infusion room.

## 2022-05-21 ENCOUNTER — Ambulatory Visit: Payer: BC Managed Care – PPO

## 2022-05-22 ENCOUNTER — Inpatient Hospital Stay: Payer: BC Managed Care – PPO | Attending: Oncology

## 2022-05-22 ENCOUNTER — Other Ambulatory Visit: Payer: Self-pay

## 2022-05-22 VITALS — BP 139/98 | HR 72 | Temp 98.2°F | Resp 18

## 2022-05-22 DIAGNOSIS — D509 Iron deficiency anemia, unspecified: Secondary | ICD-10-CM | POA: Insufficient documentation

## 2022-05-22 MED ORDER — SODIUM CHLORIDE 0.9 % IV SOLN: Freq: Once | INTRAVENOUS | Status: AC

## 2022-05-22 MED ORDER — SODIUM CHLORIDE 0.9 % IV SOLN
200.0000 mg | Freq: Once | INTRAVENOUS | Status: AC
  Administered 2022-05-22: 200 mg via INTRAVENOUS
  Filled 2022-05-22: qty 200

## 2022-05-22 NOTE — Progress Notes (Signed)
Patient declined to wait her 30 minute post observation. Vss upon discharge patient tolerated treatment well.

## 2022-05-22 NOTE — Patient Instructions (Signed)

## 2022-05-27 ENCOUNTER — Encounter: Payer: Self-pay | Admitting: Nurse Practitioner

## 2022-05-28 ENCOUNTER — Ambulatory Visit: Payer: BC Managed Care – PPO

## 2022-05-29 ENCOUNTER — Inpatient Hospital Stay: Payer: BC Managed Care – PPO

## 2022-05-29 ENCOUNTER — Other Ambulatory Visit: Payer: Self-pay

## 2022-05-29 VITALS — BP 133/98 | HR 88 | Temp 98.2°F | Resp 17

## 2022-05-29 DIAGNOSIS — D509 Iron deficiency anemia, unspecified: Secondary | ICD-10-CM | POA: Diagnosis not present

## 2022-05-29 MED ORDER — SODIUM CHLORIDE 0.9 % IV SOLN: Freq: Once | INTRAVENOUS | Status: AC

## 2022-05-29 MED ORDER — SODIUM CHLORIDE 0.9 % IV SOLN
200.0000 mg | Freq: Once | INTRAVENOUS | Status: AC
  Administered 2022-05-29: 200 mg via INTRAVENOUS
  Filled 2022-05-29: qty 200

## 2022-05-29 NOTE — Patient Instructions (Signed)

## 2022-06-05 ENCOUNTER — Ambulatory Visit: Payer: BC Managed Care – PPO | Admitting: Nurse Practitioner

## 2022-06-05 ENCOUNTER — Encounter: Payer: Self-pay | Admitting: Nurse Practitioner

## 2022-06-05 VITALS — BP 140/100 | HR 86 | Temp 98.1°F | Ht 64.0 in | Wt 202.6 lb

## 2022-06-05 DIAGNOSIS — M797 Fibromyalgia: Secondary | ICD-10-CM

## 2022-06-05 DIAGNOSIS — R221 Localized swelling, mass and lump, neck: Secondary | ICD-10-CM

## 2022-06-05 DIAGNOSIS — I159 Secondary hypertension, unspecified: Secondary | ICD-10-CM | POA: Diagnosis not present

## 2022-06-05 DIAGNOSIS — Z2821 Immunization not carried out because of patient refusal: Secondary | ICD-10-CM

## 2022-06-05 DIAGNOSIS — Z8719 Personal history of other diseases of the digestive system: Secondary | ICD-10-CM

## 2022-06-05 DIAGNOSIS — D509 Iron deficiency anemia, unspecified: Secondary | ICD-10-CM

## 2022-06-05 DIAGNOSIS — Z91018 Allergy to other foods: Secondary | ICD-10-CM

## 2022-06-05 DIAGNOSIS — R030 Elevated blood-pressure reading, without diagnosis of hypertension: Secondary | ICD-10-CM

## 2022-06-05 MED ORDER — DULOXETINE HCL 30 MG PO CPEP: ORAL_CAPSULE | ORAL | 1 refills | Status: DC

## 2022-06-05 NOTE — Patient Instructions (Signed)

## 2022-06-05 NOTE — Progress Notes (Signed)
Barnet Glasgow Martin,acting as a Education administrator for Minette Brine, FNP.,have documented all relevant documentation on the behalf of Minette Brine, FNP,as directed by  Minette Brine, FNP while in the presence of Minette Brine, Diamond.    Subjective:     Patient ID: Gwendolyn Nguyen , female    DOB: [birth year] , 36 y.o.   MRN: [id number]   Chief Complaint  Patient presents with   Follow-up    Medication.    HPI  Patient presents today for a medication check. Patient states compliance with the medication. Patient states the medication Cymbalta works good but the upper dose is a big pill she can no take, patient is wondering if she can get the smaller dose but take 2 pills.   Patient also feels the place she was referred to [facility]) isnt going to cover all her pain when she has flare ups, she also states they had questions about medication. They will be focusing with physical therapy and pain management and will have an xray for her spine. The provider has sent in new medications for her but she does not  know the name.   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   Patient has small bump on her shoulder that has been there for at least 6 months, she wants it looked at because she thinks it is a cyst.  Denies pain. She did not notice until she changed her necklace.  The size has stayed the same.   She continues to see the therapist for counseling   BP Readings from Last 3 Encounters: 06/05/22 : (!) 150/100 05/29/22 : (!) 133/98 05/22/22 : (!) 139/98  Wt Readings from Last 3 Encounters: 06/05/22 : 202 lb 9.6 oz (91.9 kg) 04/30/22 : 205 lb (93 kg) 04/29/22 : 204 lb 3.2 oz (92.6 kg)         Past Medical History:  Diagnosis Date   Anemia    Back pain    Constipation    Endometriosis    IBS (irritable bowel syndrome)    Low hemoglobin    Multiple food allergies    PID (acute pelvic inflammatory disease) 60/7371   complicated by tuboovarian  abscess   TOA (tubo-ovarian abscess) 07/24/2019     Family History  Problem Relation Age of Onset   Hyperlipidemia Mother    Hypertension Mother    Obesity Mother    Hyperlipidemia Father    Hypertension Father    Obesity Father    Diabetes Maternal Grandmother    Heart disease Maternal Grandfather    Colon cancer Neg Hx    Esophageal cancer Neg Hx    Inflammatory bowel disease Neg Hx    Liver disease Neg Hx    Pancreatic cancer Neg Hx    Rectal cancer Neg Hx    Stomach cancer Neg Hx      Current Outpatient Medications:    albuterol (VENTOLIN HFA) 108 (90 Base) MCG/ACT inhaler, Inhale 2 puffs into the lungs every 6 (six) hours as needed for wheezing or shortness of breath., Disp: 8 g, Rfl: 2   cyclobenzaprine (FLEXERIL) 5 MG tablet, Take 5 mg by mouth as directed. 2 times a day per GYN, Disp: , Rfl:    DULoxetine (CYMBALTA) 30 MG capsule, Take 2 capsules by mouth daily, Disp: 120 capsule, Rfl: 1   EPINEPHrine (EPIPEN 2-PAK) 0.3 mg/0.3 mL IJ SOAJ injection, Inject 0.3 mg into the muscle as needed  for anaphylaxis., Disp: 1 each, Rfl: 1   gabapentin (NEURONTIN) 100 MG capsule, Take 1 capsule (100 mg total) by mouth daily. Take with 300 mg tab total 400 mg TID, Disp: 90 capsule, Rfl: 1   gabapentin (NEURONTIN) 300 MG capsule, Take 1 capsule (300 mg total) by mouth 3 (three) times daily., Disp: 90 capsule, Rfl: 2   ketorolac (TORADOL) 10 MG tablet, Take 1 tablet (10 mg total) by mouth every 6 (six) hours as needed., Disp: 20 tablet, Rfl: 2   omeprazole (PRILOSEC) 40 MG capsule, Take 40 mg by mouth daily. As directed by GI, Disp: , Rfl:    phentermine 15 MG capsule, Take 1 capsule (15 mg total) by mouth every morning., Disp: 30 capsule, Rfl: 1   predniSONE (DELTASONE) 5 MG tablet, Take 5 mg by mouth as directed. PRN, Disp: , Rfl:    traMADol (ULTRAM) 50 MG tablet, Take 1 tablet (50 mg total) by mouth every 6 (six) hours as needed., Disp: 30 tablet, Rfl: 1   norethindrone (MICRONOR) 0.35  MG tablet, 1 tablet (Patient not taking: Reported on 06/05/2022), Disp: , Rfl:    Allergies  Allergen Reactions   Beef-Derived Products Hives and Itching   Gadolinium Derivatives Hives, Itching and Nausea And Vomiting    Pt vomited immediately during injection.  15 minutes later, pt developed hives and itching.    Other Hives and Itching    Lamb   Shellfish Allergy Hives   Sulfa Antibiotics Other (See Comments)    Patient was told to NOT TAKE THIS   Sulfamethoxazole-Trimethoprim Other (See Comments)    Other reaction(s): hives   Iohexol Hives and Rash    08-2018, rash and hives, needs pre meds S/W PATIENT STATED REACTION FROM CT CONTRAST 11/19     Review of Systems  Constitutional: Negative.  Negative for fatigue.  Respiratory: Negative.    Cardiovascular: Negative.  Negative for chest pain, palpitations and leg swelling.  Neurological: Negative.   Psychiatric/Behavioral: Negative.       Today's Vitals   06/05/22 1631 06/05/22 1658  BP: (!) 150/100 (!) 140/100  Pulse: 86   Temp: 98.1 F (36.7 C)   TempSrc: Oral   SpO2: 96%   Weight: 202 lb 9.6 oz (91.9 kg)   Height: '5\' 4"'$  (1.626 m)   PainSc: 6    PainLoc: Back    Body mass index is 34.78 kg/m.  Wt Readings from Last 3 Encounters:  06/05/22 202 lb 9.6 oz (91.9 kg)  04/30/22 205 lb (93 kg)  04/29/22 204 lb 3.2 oz (92.6 kg)     Objective:  Physical Exam Vitals reviewed.  Constitutional:      General: She is not in acute distress.    Appearance: Normal appearance.  Cardiovascular:     Rate and Rhythm: Normal rate and regular rhythm.     Pulses: Normal pulses.     Heart sounds: Normal heart sounds. No murmur heard. Pulmonary:     Effort: Pulmonary effort is normal. No respiratory distress.     Breath sounds: Normal breath sounds. No wheezing.  Skin:    General: Skin is warm and dry.     Comments: Right posterior shoulder with firm mass  Neurological:     General: No focal deficit present.     Mental Status:  She is alert and oriented to person, place, and time.  Psychiatric:        Mood and Affect: Mood normal.  Behavior: Behavior normal.        Thought Content: Thought content normal.        Judgment: Judgment normal.         Assessment And Plan:     1. Fibromyalgia Comments: Changed cymbalta backto 30 mg for her to take 2 tabs due to size of pill/capsule. Continues to have pain but does have some improvement  2. History of gastritis  3. Diphtheria-pertussis-tetanus (DPT) vaccination declined  4. Secondary hypertension Comments: Blood pressure improved but still elevated, may be related to pain. May need to add a blood pressure medication.   5. Mass in neck Comments: Firm, mobile mass to right lower neck/shoulder. At this time would like to continue to monitor if worsens will consider imaging      Patient was given opportunity to ask questions. Patient verbalized understanding of the plan and was able to repeat key elements of the plan. All questions were answered to their satisfaction.  Minette Brine, FNP   I, Minette Brine, FNP, have reviewed all documentation for this visit. The documentation on 06/05/22 for the exam, diagnosis, procedures, and orders are all accurate and complete.   IF YOU HAVE BEEN REFERRED TO A SPECIALIST, IT MAY TAKE 1-2 WEEKS TO SCHEDULE/PROCESS THE REFERRAL. IF YOU HAVE NOT HEARD FROM US/SPECIALIST IN TWO WEEKS, PLEASE GIVE [place] A CALL AT [phone].   THE PATIENT IS ENCOURAGED TO PRACTICE SOCIAL DISTANCING DUE TO THE COVID-19 PANDEMIC.

## 2022-06-08 ENCOUNTER — Encounter: Payer: Self-pay | Admitting: Nurse Practitioner

## 2022-06-08 NOTE — Patient Instructions (Incomplete)
Adverse food reaction - Avoid shellfish. In case of an allergic reaction, give Benadryl 4 teaspoonfuls every 4 hours, and if life-threatening symptoms occur, inject with EpiPen 0.3 mg.    Chronic hives and swelling  - Continue to avoid all red meat. In case of an allergic reaction, give Benadryl 4 teaspoonfuls every 4 hours, and if life-threatening symptoms occur, inject with EpiPen 0.3 mg. - For hive control recommend she continue with a daily long-acting antihistamine like Zyrtec or Allegra.  If you still have hives on daily dosing then increase to twice a day dosing.  If you have hives still on twice a day dosing then add in Pepcid 1 tab twice a day.  If you continue to have hives on both H1 and H2 blockers then let us know as would recommend either Singulair and/or starting Xolair monthly injections for chronic spontaneous hives. We will get lab work due to you continuing to have swelling of hands feet, and neck. We will call you with results once they are all back.  Perennial allergic rhinitis - your skin testing today was positive  to major mold mix 4, German cockroach, and mite mix -Start avoidance measures as below -Can use antihistamine Zyrtec or Allegra as above as needed for environmental allergy symptoms -May use over-the-counter fluticasone nasal spray 1 to 2 sprays each nostril once a day as needed for stuffy nose.In the right nostril, point the applicator out toward the right ear. In the left nostril, point the applicator out toward the left ear   Adverse medication effect -Has had 2 reactions related to contrast.  There are premedication regimens for contrasted studies if she is not needing something emergent -Continue to avoid sulfa antibiotics  Shortness of breath  Albuterol inhaler 2 puffs every 4-6 hours as needed for cough/wheeze/shortness of breath/chest tightness.  May use 15-20 minutes prior to activity.   Monitor frequency of use.   Lets do a 30-day trial of Flovent 110  mcg due to daily albuterol use.  Use 2 puffs twice a day with spacer to help prevent cough and wheeze.  Reviewed proper technique.  Spacer given   Keep already scheduled follow up appointment on 08/15/22  @ 10 AM or sooner if needed  Control of Cockroach Allergen  Cockroach allergen has been identified as an important cause of acute attacks of asthma, especially in urban settings.  There are fifty-five species of cockroach that exist in the [place], however only three, the [place], [place] species produce allergen that can affect patients with Asthma.  Allergens can be obtained from fecal particles, egg casings and secretions from cockroaches.    Remove food sources. Reduce access to water. Seal access and entry points. Spray runways with 0.5-1% Diazinon or Chlorpyrifos Blow boric acid power under stoves and refrigerator. Place bait stations (hydramethylnon) at feeding sites.   Control of Dust Mite Allergen Dust mites play a major role in allergic asthma and rhinitis. They occur in environments with high humidity wherever human skin is found. Dust mites absorb humidity from the atmosphere (ie, they do not drink) and feed on organic matter (including shed human and animal skin). Dust mites are a microscopic type of insect that you cannot see with the naked eye. High levels of dust mites have been detected from mattresses, pillows, carpets, upholstered furniture, bed covers, clothes, soft toys and any woven material. The principal allergen of the dust mite is found in its feces. A gram of dust may contain 1,000  mites and 250,000 fecal particles. Mite antigen is easily measured in the air during house cleaning activities. Dust mites do not bite and do not cause harm to humans, other than by triggering allergies/asthma.  Ways to decrease your exposure to dust mites in your home:  1. Encase mattresses, box springs and pillows with a mite-impermeable barrier or cover  2. Wash  sheets, blankets and drapes weekly in hot water (130 F) with detergent and dry them in a dryer on the hot setting.  3. Have the room cleaned frequently with a vacuum cleaner and a damp dust-mop. For carpeting or rugs, vacuuming with a vacuum cleaner equipped with a high-efficiency particulate air (HEPA) filter. The dust mite allergic individual should not be in a room which is being cleaned and should wait 1 hour after cleaning before going into the room.  4. Do not sleep on upholstered furniture (eg, couches).  5. If possible removing carpeting, upholstered furniture and drapery from the home is ideal. Horizontal blinds should be eliminated in the rooms where the person spends the most time (bedroom, study, television room). Washable vinyl, roller-type shades are optimal.  6. Remove all non-washable stuffed toys from the bedroom. Wash stuffed toys weekly like sheets and blankets above.  7. Reduce indoor humidity to less than 50%. Inexpensive humidity monitors can be purchased at most hardware stores. Do not use a humidifier as can make the problem worse and are not recommended.  Control of Mold Allergen Mold and fungi can grow on a variety of surfaces provided certain temperature and moisture conditions exist.  Outdoor molds grow on plants, decaying vegetation and soil.  The major outdoor mold, Alternaria and Cladosporium, are found in very high numbers during hot and dry conditions.  Generally, a late Summer - Fall peak is seen for common outdoor fungal spores.  Rain will temporarily lower outdoor mold spore count, but counts rise rapidly when the rainy period ends.  The most important indoor molds are Aspergillus and Penicillium.  Dark, humid and poorly ventilated basements are ideal sites for mold growth.  The next most common sites of mold growth are the bathroom and the kitchen.  Outdoor [employer] Use air conditioning and keep windows closed Avoid exposure to decaying vegetation. Avoid  leaf raking. Avoid grain handling. Consider wearing a face mask if working in moldy areas.  Indoor Mold Control Maintain humidity below 50%. Clean washable surfaces with 5% bleach solution. Remove sources e.g. Contaminated carpets.

## 2022-06-09 ENCOUNTER — Other Ambulatory Visit: Payer: Self-pay | Admitting: Family

## 2022-06-09 ENCOUNTER — Other Ambulatory Visit: Payer: Self-pay

## 2022-06-09 ENCOUNTER — Telehealth: Payer: Self-pay

## 2022-06-09 ENCOUNTER — Ambulatory Visit (INDEPENDENT_AMBULATORY_CARE_PROVIDER_SITE_OTHER): Payer: BC Managed Care – PPO | Admitting: Family

## 2022-06-09 ENCOUNTER — Encounter: Payer: Self-pay | Admitting: Family

## 2022-06-09 VITALS — BP 120/84 | HR 97 | Temp 98.1°F | Resp 18 | Ht 65.0 in | Wt 205.8 lb

## 2022-06-09 DIAGNOSIS — R0602 Shortness of breath: Secondary | ICD-10-CM | POA: Diagnosis not present

## 2022-06-09 DIAGNOSIS — L508 Other urticaria: Secondary | ICD-10-CM

## 2022-06-09 DIAGNOSIS — J3089 Other allergic rhinitis: Secondary | ICD-10-CM | POA: Diagnosis not present

## 2022-06-09 DIAGNOSIS — T50905D Adverse effect of unspecified drugs, medicaments and biological substances, subsequent encounter: Secondary | ICD-10-CM

## 2022-06-09 DIAGNOSIS — R059 Cough, unspecified: Secondary | ICD-10-CM

## 2022-06-09 DIAGNOSIS — T783XXD Angioneurotic edema, subsequent encounter: Secondary | ICD-10-CM

## 2022-06-09 DIAGNOSIS — T781XXD Other adverse food reactions, not elsewhere classified, subsequent encounter: Secondary | ICD-10-CM

## 2022-06-09 MED ORDER — FLUTICASONE PROPIONATE HFA 110 MCG/ACT IN AERO: INHALATION_SPRAY | RESPIRATORY_TRACT | 1 refills | Status: DC

## 2022-06-09 MED ORDER — ALBUTEROL SULFATE HFA 108 (90 BASE) MCG/ACT IN AERS: 2.0000 | INHALATION_SPRAY | Freq: Four times a day (QID) | RESPIRATORY_TRACT | 1 refills | Status: DC | PRN

## 2022-06-09 NOTE — Telephone Encounter (Signed)
Provider intended to schedule a BP follow up with patient this week due to elevated reading. Patient was seen at [facility] today and her reading was 120/84.

## 2022-06-09 NOTE — Progress Notes (Signed)
[place] [id number] Dept: [phone]  FOLLOW UP NOTE  Patient ID: Gwendolyn Nguyen, female    DOB: 05-25-1986  Age: 36 y.o. MRN: [id number] Date of Office Visit: 06/09/2022  Assessment  Chief Complaint: Allergy Testing and Allergic Reaction (Shellfish and red meat )  HPI Gwendolyn Nguyen is a 36 year old female who presents today for skin testing to environmental allergens.  She was last seen on April 30, 2022 by Dr. Nelva Bush for adverse food reaction, chronic hives and swelling, environmental allergy, adverse medication effect, and shortness of breath.  She has been off all antihistamines for 3 days and preparation for today's testing.  She continues to avoid shellfish without any accidental ingestion or use of her epinephrine autoinjector device.  She reports that her epinephrine autoinjector device is up-to-date.  Chronic hives and swelling: She reports that she has not had any hives since avoiding all red meat.  She does continue to have swelling though that occurs randomly.  The swelling occurs in her hands feet, neck, and her face will look bloated.  She denies any concomitant cardiorespiratory and gastrointestinal symptoms.  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  It almost feels like she has a cold.  She will have sneezing, runny nose, and coughing.  She continues to avoid contrast and sulfa antibiotics.  Shortness of breath: She reports a dry daily cough, tightness from her back, shortness of breath with light activity, trying to catch her breath, or almost holding or skipping a breath.  She has been using her albuterol daily and and reports that it does help when she uses it.  She denies wheezing and nocturnal awakenings due to breathing problems.   Drug Allergies:  Allergies  Allergen Reactions    Beef-Derived Products Hives and Itching   Gadolinium Derivatives Hives, Itching and Nausea And Vomiting    Pt vomited immediately during injection.  15 minutes later, pt developed hives and itching.    Other Hives and Itching    Lamb   Shellfish Allergy Hives   Sulfa Antibiotics Other (See Comments)    Patient was told to NOT TAKE THIS   Sulfamethoxazole-Trimethoprim Other (See Comments)    Other reaction(s): hives   Iohexol Hives and Rash    08-2018, rash and hives, needs pre meds S/W PATIENT STATED REACTION FROM CT CONTRAST 11/19    Review of Systems: Review of Systems  Constitutional:  Negative for fever.       Reports hot flashes at times and problems regulating her temperature  HENT:         Denies rhinorrhea, nasal congestion, and postnasal drip, but as soon as she gets back in her work environment she has sneezing runny nose, and coughing for the first few weeks of school.  Eyes:        Reports no longer having itchy eyes since avoiding red meat  Respiratory:  Positive for cough and shortness of breath. Negative for wheezing.        Reports shortness of breath with light activity, dry cough, trying to catch her breath, or holding her breath, or skipping of breath.  Gastrointestinal:        Reports no longer having heartburn or reflux symptoms since GI has prescribed something for her reflux  Genitourinary:  Positive for frequency.       Reports  increased frequency of urination and reports that primary care is aware.  Skin:  Negative for itching and rash.  Neurological:  Negative for headaches.     Physical Exam: BP 120/84   Pulse 97   Temp 98.1 F (36.7 C)   Resp 18   Ht '5\' 5"'$  (1.651 m)   Wt 205 lb 12.8 oz (93.4 kg)   LMP 07/06/2019 (Approximate)   SpO2 99%   BMI 34.25 kg/m    Physical Exam Constitutional:      Appearance: Normal appearance.  HENT:     Head: Normocephalic and atraumatic.     Comments: Pharynx normal, eyes normal, ears normal, nose: Bilateral  lower turbinates mildly edematous with no drainage noted    Right Ear: Tympanic membrane, ear canal and external ear normal.     Left Ear: Tympanic membrane, ear canal and external ear normal.     Mouth/Throat:     Mouth: Mucous membranes are moist.     Pharynx: Oropharynx is clear.  Eyes:     Conjunctiva/sclera: Conjunctivae normal.  Cardiovascular:     Rate and Rhythm: Regular rhythm. Tachycardia present.     Heart sounds: Normal heart sounds.  Pulmonary:     Effort: Pulmonary effort is normal.     Breath sounds: Normal breath sounds.     Comments: Lungs clear to auscultation Musculoskeletal:     Cervical back: Neck supple.  Skin:    General: Skin is warm.     Comments: No rashes or urticarial lesions noted  Neurological:     Mental Status: She is alert and oriented to person, place, and time.  Psychiatric:        Mood and Affect: Mood normal.        Behavior: Behavior normal.        Thought Content: Thought content normal.        Judgment: Judgment normal.     Diagnostics: FVC 2.21 L (66%), FEV1 1.97 L (70%).  Predicted FVC 3.34 L, predicted FEV1 2.80 L.  Spirometry indicates possible mild restriction.  Unable to do further testing due to using her albuterol before this appointment.  Percutaneous skin testing to environmental allergens today is negative with a good histamine control  Intradermal skin testing today is positive to major mold mix 4, German cockroach, and mite mix  Assessment and Plan: 1. Perennial allergic rhinitis   2. Shortness of breath   3. Chronic urticaria   4. Angioedema, subsequent encounter   5. Adverse food reaction, subsequent encounter   6. Adverse effect of drug, subsequent encounter   7. Cough, unspecified type     Meds ordered this encounter  Medications   fluticasone (FLOVENT HFA) 110 MCG/ACT inhaler    Sig: Inhale 2 puffs twice a day with spacer to help prevent cough and wheeze    Dispense:  1 each    Refill:  1   albuterol  (VENTOLIN HFA) 108 (90 Base) MCG/ACT inhaler    Sig: Inhale 2 puffs into the lungs every 6 (six) hours as needed for wheezing or shortness of breath.    Dispense:  18 g    Refill:  1    Patient Instructions  Adverse food reaction - Avoid shellfish. In case of an allergic reaction, give Benadryl 4 teaspoonfuls every 4 hours, and if life-threatening symptoms occur, inject with EpiPen 0.3 mg.    Chronic hives and swelling  - Continue to avoid all red meat. In case of an allergic reaction, give  Benadryl 4 teaspoonfuls every 4 hours, and if life-threatening symptoms occur, inject with EpiPen 0.3 mg. - For hive control recommend she continue with a daily long-acting antihistamine like Zyrtec or Allegra.  If you still have hives on daily dosing then increase to twice a day dosing.  If you have hives still on twice a day dosing then add in Pepcid 1 tab twice a day.  If you continue to have hives on both H1 and H2 blockers then let us know as would recommend either Singulair and/or starting Xolair monthly injections for chronic spontaneous hives. We will get lab work due to you continuing to have swelling of hands feet, and neck. We will call you with results once they are all back.  Perennial allergic rhinitis - your skin testing today was positive  to major mold mix 4, German cockroach, and mite mix -Start avoidance measures as below -Can use antihistamine Zyrtec or Allegra as above as needed for environmental allergy symptoms -May use over-the-counter fluticasone nasal spray 1 to 2 sprays each nostril once a day as needed for stuffy nose.In the right nostril, point the applicator out toward the right ear. In the left nostril, point the applicator out toward the left ear   Adverse medication effect -Has had 2 reactions related to contrast.  There are premedication regimens for contrasted studies if she is not needing something emergent -Continue to avoid sulfa antibiotics  Shortness of breath   Albuterol inhaler 2 puffs every 4-6 hours as needed for cough/wheeze/shortness of breath/chest tightness.  May use 15-20 minutes prior to activity.   Monitor frequency of use.   Lets do a 30-day trial of Flovent 110 mcg due to daily albuterol use.  Use 2 puffs twice a day with spacer to help prevent cough and wheeze.  Reviewed proper technique.  Spacer given   Keep already scheduled follow up appointment on 08/15/22  @ 10 AM or sooner if needed  Control of Cockroach Allergen  Cockroach allergen has been identified as an important cause of acute attacks of asthma, especially in urban settings.  There are fifty-five species of cockroach that exist in the [place], however only three, the [place], [place] species produce allergen that can affect patients with Asthma.  Allergens can be obtained from fecal particles, egg casings and secretions from cockroaches.    Remove food sources. Reduce access to water. Seal access and entry points. Spray runways with 0.5-1% Diazinon or Chlorpyrifos Blow boric acid power under stoves and refrigerator. Place bait stations (hydramethylnon) at feeding sites.   Control of Dust Mite Allergen Dust mites play a major role in allergic asthma and rhinitis. They occur in environments with high humidity wherever human skin is found. Dust mites absorb humidity from the atmosphere (ie, they do not drink) and feed on organic matter (including shed human and animal skin). Dust mites are a microscopic type of insect that you cannot see with the naked eye. High levels of dust mites have been detected from mattresses, pillows, carpets, upholstered furniture, bed covers, clothes, soft toys and any woven material. The principal allergen of the dust mite is found in its feces. A gram of dust may contain 1,000 mites and 250,000 fecal particles. Mite antigen is easily measured in the air during house cleaning activities. Dust mites do not bite and do not cause harm to  humans, other than by triggering allergies/asthma.  Ways to decrease your exposure to dust mites in your home:  1. Encase mattresses,  box springs and pillows with a mite-impermeable barrier or cover  2. Wash sheets, blankets and drapes weekly in hot water (130 F) with detergent and dry them in a dryer on the hot setting.  3. Have the room cleaned frequently with a vacuum cleaner and a damp dust-mop. For carpeting or rugs, vacuuming with a vacuum cleaner equipped with a high-efficiency particulate air (HEPA) filter. The dust mite allergic individual should not be in a room which is being cleaned and should wait 1 hour after cleaning before going into the room.  4. Do not sleep on upholstered furniture (eg, couches).  5. If possible removing carpeting, upholstered furniture and drapery from the home is ideal. Horizontal blinds should be eliminated in the rooms where the person spends the most time (bedroom, study, television room). Washable vinyl, roller-type shades are optimal.  6. Remove all non-washable stuffed toys from the bedroom. Wash stuffed toys weekly like sheets and blankets above.  7. Reduce indoor humidity to less than 50%. Inexpensive humidity monitors can be purchased at most hardware stores. Do not use a humidifier as can make the problem worse and are not recommended.  Control of Mold Allergen Mold and fungi can grow on a variety of surfaces provided certain temperature and moisture conditions exist.  Outdoor molds grow on plants, decaying vegetation and soil.  The major outdoor mold, Alternaria and Cladosporium, are found in very high numbers during hot and dry conditions.  Generally, a late Summer - Fall peak is seen for common outdoor fungal spores.  Rain will temporarily lower outdoor mold spore count, but counts rise rapidly when the rainy period ends.  The most important indoor molds are Aspergillus and Penicillium.  Dark, humid and poorly ventilated basements are ideal sites  for mold growth.  The next most common sites of mold growth are the bathroom and the kitchen.  Outdoor [employer] Use air conditioning and keep windows closed Avoid exposure to decaying vegetation. Avoid leaf raking. Avoid grain handling. Consider wearing a face mask if working in moldy areas.  Indoor Mold Control Maintain humidity below 50%. Clean washable surfaces with 5% bleach solution. Remove sources e.g. Contaminated carpets.  Return in about 2 months (around 08/15/2022), or if symptoms worsen or fail to improve.    Thank you for the opportunity to care for this patient.  Please do not hesitate to contact me with questions.  Althea Charon, FNP [facility]

## 2022-06-10 ENCOUNTER — Other Ambulatory Visit: Payer: Self-pay | Admitting: *Deleted

## 2022-06-10 LAB — RESULTS CONSOLE HPV: CHL HPV: NEGATIVE

## 2022-06-10 LAB — HM PAP SMEAR: HM Pap smear: NEGATIVE

## 2022-06-11 ENCOUNTER — Other Ambulatory Visit: Payer: Self-pay | Admitting: *Deleted

## 2022-06-11 MED ORDER — PULMICORT FLEXHALER 180 MCG/ACT IN AEPB: 2.0000 | INHALATION_SPRAY | Freq: Two times a day (BID) | RESPIRATORY_TRACT | 5 refills | Status: DC

## 2022-06-11 NOTE — Telephone Encounter (Signed)
What is covered for an inhaled corticosteroid inhaler?

## 2022-06-11 NOTE — Telephone Encounter (Signed)
Please change to Pulmicort Flexhaler 180 mcg 2 puffs twice a day. She will need to come by the office for demonstration. Please send with 1 refill.

## 2022-06-11 NOTE — Telephone Encounter (Signed)
New prescription has been sent in. Called and left a detailed voicemail advising per [facility] permission of change in inhaler.

## 2022-06-17 LAB — COMPLEMENT COMPONENT C1Q: Complement C1Q: 10.3 mg/dL (ref 10.3–20.5)

## 2022-06-17 LAB — C4 COMPLEMENT: Complement C4, Serum: 38 mg/dL (ref 12–38)

## 2022-06-17 LAB — C1 ESTERASE INHIBITOR, FUNCTIONAL: C1INH Functional/C1INH Total MFr SerPl: >93 %mean normal

## 2022-06-17 LAB — C1 ESTERASE INHIBITOR: C1INH SerPl-mCnc: 35 mg/dL (ref 21–39)

## 2022-06-17 NOTE — Progress Notes (Signed)
Please let Gwendolyn Nguyen know that her angioedema labs are normal.

## 2022-06-23 ENCOUNTER — Encounter: Payer: Self-pay | Admitting: Nurse Practitioner

## 2022-06-24 ENCOUNTER — Ambulatory Visit: Payer: BC Managed Care – PPO | Admitting: Nurse Practitioner

## 2022-06-24 ENCOUNTER — Encounter: Payer: Self-pay | Admitting: Nurse Practitioner

## 2022-06-24 VITALS — BP 128/90 | HR 100 | Temp 98.1°F | Ht 65.0 in | Wt 204.8 lb

## 2022-06-24 DIAGNOSIS — E6609 Other obesity due to excess calories: Secondary | ICD-10-CM | POA: Diagnosis not present

## 2022-06-24 DIAGNOSIS — R221 Localized swelling, mass and lump, neck: Secondary | ICD-10-CM | POA: Diagnosis not present

## 2022-06-24 DIAGNOSIS — I158 Other secondary hypertension: Secondary | ICD-10-CM | POA: Diagnosis not present

## 2022-06-24 DIAGNOSIS — Z6834 Body mass index (BMI) 34.0-34.9, adult: Secondary | ICD-10-CM

## 2022-06-24 DIAGNOSIS — L03221 Cellulitis of neck: Secondary | ICD-10-CM

## 2022-06-24 MED ORDER — AMLODIPINE BESYLATE 2.5 MG PO TABS: 2.5000 mg | ORAL_TABLET | Freq: Every day | ORAL | 11 refills | Status: DC

## 2022-06-24 MED ORDER — CEPHALEXIN 500 MG PO CAPS: 500.0000 mg | ORAL_CAPSULE | Freq: Four times a day (QID) | ORAL | 0 refills | Status: DC

## 2022-06-24 NOTE — Progress Notes (Signed)
I,Victoria T Hamilton,acting as a Education administrator for Minette Brine, FNP.,have documented all relevant documentation on the behalf of Minette Brine, FNP,as directed by  Minette Brine, FNP while in the presence of Minette Brine, [place].    Subjective:     Patient ID: Gwendolyn Nguyen , female    DOB: 04-10-1986 , 36 y.o.   MRN: [id number]   Chief Complaint  Patient presents with   Cyst    HPI  Patient presents today for knot she noticed Saturday on her shoulder/neck was inflamed. It was itchy and sore. Now it's larger and painful. Probably 4 times the previous size.she would like to know next steps. She has been on blood pressure medications in the past and felt was related to her pain.   Patient reports GYN Dr Lanelle Bal. [facility] gynecology.       Past Medical History:  Diagnosis Date   Anemia    Asthma    Back pain    Constipation    Endometriosis    IBS (irritable bowel syndrome)    Low hemoglobin    Multiple food allergies    PID (acute pelvic inflammatory disease) 20/1007   complicated by tuboovarian abscess   TOA (tubo-ovarian abscess) 07/24/2019     Family History  Problem Relation Age of Onset   Hyperlipidemia Mother    Hypertension Mother    Obesity Mother    Hyperlipidemia Father    Hypertension Father    Obesity Father    Diabetes Maternal Grandmother    Heart disease Maternal Grandfather    Colon cancer Neg Hx    Esophageal cancer Neg Hx    Inflammatory bowel disease Neg Hx    Liver disease Neg Hx    Pancreatic cancer Neg Hx    Rectal cancer Neg Hx    Stomach cancer Neg Hx      Current Outpatient Medications:    amLODipine (NORVASC) 2.5 MG tablet, Take 1 tablet (2.5 mg total) by mouth daily., Disp: 30 tablet, Rfl: 11   cephALEXin (KEFLEX) 500 MG capsule, Take 1 capsule (500 mg total) by mouth 4 (four) times daily for 10 days., Disp: 40 capsule, Rfl: 0   albuterol (VENTOLIN HFA) 108 (90 Base) MCG/ACT inhaler, Inhale 2 puffs into the lungs every 6 (six) hours as needed for  wheezing or shortness of breath., Disp: 18 g, Rfl: 1   cyclobenzaprine (FLEXERIL) 5 MG tablet, Take 5 mg by mouth as directed. 2 times a day per GYN, Disp: , Rfl:    DULoxetine (CYMBALTA) 30 MG capsule, Take 2 capsules by mouth daily, Disp: 120 capsule, Rfl: 1   EPINEPHrine (EPIPEN 2-PAK) 0.3 mg/0.3 mL IJ SOAJ injection, Inject 0.3 mg into the muscle as needed for anaphylaxis., Disp: 1 each, Rfl: 1   fluticasone (FLOVENT HFA) 110 MCG/ACT inhaler, Inhale 2 puffs twice a day with spacer to help prevent cough and wheeze, Disp: 1 each, Rfl: 1   gabapentin (NEURONTIN) 100 MG capsule, Take 1 capsule (100 mg total) by mouth daily. Take with 300 mg tab total 400 mg TID, Disp: 90 capsule, Rfl: 1   gabapentin (NEURONTIN) 300 MG capsule, Take 1 capsule (300 mg total) by mouth 3 (three) times daily., Disp: 90 capsule, Rfl: 2   ketorolac (TORADOL) 10 MG tablet, Take 1 tablet (10 mg total) by mouth every 6 (six) hours as needed., Disp: 20 tablet, Rfl: 2   norethindrone (MICRONOR) 0.35 MG tablet, , Disp: , Rfl:    omeprazole (PRILOSEC) 40 MG capsule, Take 40  mg by mouth daily. As directed by GI, Disp: , Rfl:    phentermine 15 MG capsule, Take 1 capsule (15 mg total) by mouth every morning., Disp: 30 capsule, Rfl: 1   predniSONE (DELTASONE) 5 MG tablet, Take 5 mg by mouth as directed. PRN, Disp: , Rfl:    PULMICORT FLEXHALER 180 MCG/ACT inhaler, Inhale 2 puffs into the lungs in the morning and at bedtime., Disp: 1 each, Rfl: 5   traMADol (ULTRAM) 50 MG tablet, Take 1 tablet (50 mg total) by mouth every 6 (six) hours as needed., Disp: 30 tablet, Rfl: 1   Allergies  Allergen Reactions   Beef-Derived Products Hives and Itching   Gadolinium Derivatives Hives, Itching and Nausea And Vomiting    Pt vomited immediately during injection.  15 minutes later, pt developed hives and itching.    Other Hives and Itching    Lamb   Shellfish Allergy Hives   Sulfa Antibiotics Other (See Comments)    Patient was told to NOT  TAKE THIS   Sulfamethoxazole-Trimethoprim Other (See Comments)    Other reaction(s): hives   Iohexol Hives and Rash    08-2018, rash and hives, needs pre meds S/W PATIENT STATED REACTION FROM CT CONTRAST 11/19     Review of Systems  Constitutional: Negative.   Respiratory: Negative.    Cardiovascular: Negative.   Neurological: Negative.   Psychiatric/Behavioral: Negative.       Today's Vitals   06/24/22 1445 06/24/22 1532  BP: (!) 140/98 (!) 128/90  Pulse: 100   Temp: 98.1 F (36.7 C)   SpO2: 98%   Weight: 204 lb 12.8 oz (92.9 kg)   Height: '5\' 5"'$  (1.651 m)   PainSc: 5     Body mass index is 34.08 kg/m.  Wt Readings from Last 3 Encounters:  06/24/22 204 lb 12.8 oz (92.9 kg)  06/09/22 205 lb 12.8 oz (93.4 kg)  06/05/22 202 lb 9.6 oz (91.9 kg)    Objective:  Physical Exam Vitals reviewed.  Constitutional:      General: She is not in acute distress.    Appearance: Normal appearance.  Cardiovascular:     Rate and Rhythm: Normal rate and regular rhythm.     Pulses: Normal pulses.     Heart sounds: Normal heart sounds. No murmur heard. Pulmonary:     Effort: Pulmonary effort is normal. No respiratory distress.     Breath sounds: Normal breath sounds. No wheezing.  Skin:    General: Skin is warm and dry.     Findings: Erythema present.     Comments: Right posterior shoulder mass is larger and is now erythematous and hot to touch  Neurological:     General: No focal deficit present.     Mental Status: She is alert and oriented to person, place, and time.  Psychiatric:        Mood and Affect: Mood normal.        Behavior: Behavior normal.        Thought Content: Thought content normal.        Judgment: Judgment normal.         Assessment And Plan:     1. Mass in neck Comments: The area on the left side of neck is now erythematous, larger and tender to touch with hot to touch, will treat for cellulitis and check ulfrasound - cephALEXin (KEFLEX) 500 MG capsule;  Take 1 capsule (500 mg total) by mouth 4 (four) times daily for 10 days.  Dispense: 40 capsule; Refill: 0 - US Soft Tissue Head/Neck (NON-THYROID); Future  2. Class 1 obesity due to excess calories without serious comorbidity with body mass index (BMI) of 34.0 to 34.9 in adult She is encouraged to strive for BMI less than 30 to decrease cardiac risk. Advised to aim for at least 150 minutes of exercise per week.  3. Cellulitis of neck - cephALEXin (KEFLEX) 500 MG capsule; Take 1 capsule (500 mg total) by mouth 4 (four) times daily for 10 days.  Dispense: 40 capsule; Refill: 0 - US Soft Tissue Head/Neck (NON-THYROID); Future  4. Other secondary hypertension Comments: Blood pressure improved after recheck, this is likely related to pain.  - amLODipine (NORVASC) 2.5 MG tablet; Take 1 tablet (2.5 mg total) by mouth daily.  Dispense: 30 tablet; Refill: 11    Patient was given opportunity to ask questions. Patient verbalized understanding of the plan and was able to repeat key elements of the plan. All questions were answered to their satisfaction.  Minette Brine, FNP   I, Minette Brine, FNP, have reviewed all documentation for this visit. The documentation on 06/24/22 for the exam, diagnosis, procedures, and orders are all accurate and complete.   IF YOU HAVE BEEN REFERRED TO A SPECIALIST, IT MAY TAKE 1-2 WEEKS TO SCHEDULE/PROCESS THE REFERRAL. IF YOU HAVE NOT HEARD FROM US/SPECIALIST IN TWO WEEKS, PLEASE GIVE [place] A CALL AT [phone].   THE PATIENT IS ENCOURAGED TO PRACTICE SOCIAL DISTANCING DUE TO THE COVID-19 PANDEMIC.

## 2022-06-24 NOTE — Patient Instructions (Signed)

## 2022-06-29 ENCOUNTER — Encounter: Payer: Self-pay | Admitting: Nurse Practitioner

## 2022-07-01 ENCOUNTER — Other Ambulatory Visit: Payer: BC Managed Care – PPO

## 2022-07-07 ENCOUNTER — Other Ambulatory Visit: Payer: Self-pay | Admitting: Nurse Practitioner

## 2022-07-07 DIAGNOSIS — E6609 Other obesity due to excess calories: Secondary | ICD-10-CM

## 2022-07-14 ENCOUNTER — Other Ambulatory Visit: Payer: Self-pay | Admitting: Nurse Practitioner

## 2022-07-14 DIAGNOSIS — R221 Localized swelling, mass and lump, neck: Secondary | ICD-10-CM

## 2022-07-14 DIAGNOSIS — L03221 Cellulitis of neck: Secondary | ICD-10-CM

## 2022-07-14 MED ORDER — CEPHALEXIN 500 MG PO CAPS: 500.0000 mg | ORAL_CAPSULE | Freq: Four times a day (QID) | ORAL | 0 refills | Status: AC

## 2022-07-21 ENCOUNTER — Encounter: Payer: Self-pay | Admitting: Nurse Practitioner

## 2022-07-22 ENCOUNTER — Ambulatory Visit (INDEPENDENT_AMBULATORY_CARE_PROVIDER_SITE_OTHER): Payer: BC Managed Care – PPO | Admitting: Nurse Practitioner

## 2022-07-22 ENCOUNTER — Encounter: Payer: Self-pay | Admitting: Nurse Practitioner

## 2022-07-22 ENCOUNTER — Other Ambulatory Visit (HOSPITAL_COMMUNITY)
Admission: RE | Admit: 2022-07-22 | Discharge: 2022-07-22 | Disposition: A | Discharge status: Home / Self Care | Payer: BC Managed Care – PPO | Source: Ambulatory Visit | Attending: Nurse Practitioner | Admitting: Nurse Practitioner

## 2022-07-22 VITALS — BP 126/90 | HR 81 | Temp 98.1°F | Ht 65.0 in | Wt 202.8 lb

## 2022-07-22 DIAGNOSIS — Z1159 Encounter for screening for other viral diseases: Secondary | ICD-10-CM

## 2022-07-22 DIAGNOSIS — I159 Secondary hypertension, unspecified: Secondary | ICD-10-CM | POA: Diagnosis not present

## 2022-07-22 DIAGNOSIS — E6609 Other obesity due to excess calories: Secondary | ICD-10-CM

## 2022-07-22 DIAGNOSIS — Z2821 Immunization not carried out because of patient refusal: Secondary | ICD-10-CM

## 2022-07-22 DIAGNOSIS — D509 Iron deficiency anemia, unspecified: Secondary | ICD-10-CM

## 2022-07-22 DIAGNOSIS — Z113 Encounter for screening for infections with a predominantly sexual mode of transmission: Secondary | ICD-10-CM | POA: Insufficient documentation

## 2022-07-22 DIAGNOSIS — Z6833 Body mass index (BMI) 33.0-33.9, adult: Secondary | ICD-10-CM

## 2022-07-22 DIAGNOSIS — Z114 Encounter for screening for human immunodeficiency virus [HIV]: Secondary | ICD-10-CM

## 2022-07-22 DIAGNOSIS — F321 Major depressive disorder, single episode, moderate: Secondary | ICD-10-CM

## 2022-07-22 DIAGNOSIS — Z0001 Encounter for general adult medical examination with abnormal findings: Secondary | ICD-10-CM | POA: Diagnosis not present

## 2022-07-22 DIAGNOSIS — M797 Fibromyalgia: Secondary | ICD-10-CM | POA: Diagnosis not present

## 2022-07-22 DIAGNOSIS — Z0000 Encounter for general adult medical examination without abnormal findings: Secondary | ICD-10-CM

## 2022-07-22 LAB — POCT URINALYSIS DIPSTICK
Bilirubin, UA: NEGATIVE
Blood, UA: NEGATIVE
Glucose, UA: NEGATIVE
Ketones, UA: NEGATIVE
Leukocytes, UA: NEGATIVE
Nitrite, UA: NEGATIVE
Protein, UA: NEGATIVE
Spec Grav, UA: >=1.03 — AB (ref 1.010–1.025)
Urobilinogen, UA: 0.2 E.U./dL
pH, UA: 5.5 (ref 5.0–8.0)

## 2022-07-22 MED ORDER — CARIPRAZINE HCL 1.5 MG PO CAPS: 1.5000 mg | ORAL_CAPSULE | Freq: Every day | ORAL | 5 refills | Status: DC

## 2022-07-22 MED ORDER — AMLODIPINE BESYLATE 5 MG PO TABS: 5.0000 mg | ORAL_TABLET | Freq: Every day | ORAL | 11 refills | Status: DC

## 2022-07-22 MED ORDER — FLUCONAZOLE 100 MG PO TABS: 100.0000 mg | ORAL_TABLET | Freq: Every day | ORAL | 0 refills | Status: DC

## 2022-07-22 NOTE — Patient Instructions (Signed)

## 2022-07-22 NOTE — Progress Notes (Signed)
I,Victoria T Hamilton,acting as a Education administrator for Minette Brine, FNP.,have documented all relevant documentation on the behalf of Minette Brine, FNP,as directed by  Minette Brine, FNP while in the presence of Minette Brine, [place].   Subjective:     Patient ID: Gwendolyn Nguyen , female    DOB: 1986-03-05 , 36 y.o.   MRN: [id number]   Chief Complaint  Patient presents with   Annual Exam    HPI  Here for HM. She reports compliance with medications. She is currently on amlodipine blood pressure elevated. She is currently in pain rating 7/10. She had pizza today. She reports only drinking 2 bottles of water today.   She adds, feeling depressed in the last 2 weeks. Screening completed. She is on cymbalta for her pain. She is seeing a counselor Aggie Cosier) she is are of her symptoms.      Past Medical History:  Diagnosis Date   Anemia    Asthma    Back pain    Constipation    Endometriosis    IBS (irritable bowel syndrome)    Low hemoglobin    Multiple food allergies    PID (acute pelvic inflammatory disease) 67/5916   complicated by tuboovarian abscess   TOA (tubo-ovarian abscess) 07/24/2019     Family History  Problem Relation Age of Onset   Hyperlipidemia Mother    Hypertension Mother    Obesity Mother    Hyperlipidemia Father    Hypertension Father    Obesity Father    Diabetes Maternal Grandmother    Heart disease Maternal Grandfather    Colon cancer Neg Hx    Esophageal cancer Neg Hx    Inflammatory bowel disease Neg Hx    Liver disease Neg Hx    Pancreatic cancer Neg Hx    Rectal cancer Neg Hx    Stomach cancer Neg Hx      Current Outpatient Medications:    albuterol (VENTOLIN HFA) 108 (90 Base) MCG/ACT inhaler, Inhale 2 puffs into the lungs every 6 (six) hours as needed for wheezing or shortness of breath., Disp: 18 g, Rfl: 1   cariprazine (VRAYLAR) 1.5 MG capsule, Take 1 capsule (1.5 mg total) by mouth daily., Disp: 30 capsule, Rfl: 5   cyclobenzaprine (FLEXERIL)  5 MG tablet, Take 5 mg by mouth as directed. 2 times a day per GYN, Disp: , Rfl:    DULoxetine (CYMBALTA) 30 MG capsule, Take 2 capsules by mouth daily, Disp: 120 capsule, Rfl: 1   EPINEPHrine (EPIPEN 2-PAK) 0.3 mg/0.3 mL IJ SOAJ injection, Inject 0.3 mg into the muscle as needed for anaphylaxis., Disp: 1 each, Rfl: 1   fluconazole (DIFLUCAN) 100 MG tablet, Take 1 tablet (100 mg total) by mouth daily. Take 1 tablet by mouth now repeat in 5 days, Disp: 2 tablet, Rfl: 0   fluticasone (FLOVENT HFA) 110 MCG/ACT inhaler, Inhale 2 puffs twice a day with spacer to help prevent cough and wheeze, Disp: 1 each, Rfl: 1   gabapentin (NEURONTIN) 100 MG capsule, Take 1 capsule (100 mg total) by mouth daily. Take with 300 mg tab total 400 mg TID, Disp: 90 capsule, Rfl: 1   gabapentin (NEURONTIN) 300 MG capsule, Take 1 capsule (300 mg total) by mouth 3 (three) times daily., Disp: 90 capsule, Rfl: 2   ketorolac (TORADOL) 10 MG tablet, Take 1 tablet (10 mg total) by mouth every 6 (six) hours as needed., Disp: 20 tablet, Rfl: 2   omeprazole (PRILOSEC) 40 MG capsule, Take 40 mg  by mouth daily. As directed by GI, Disp: , Rfl:    phentermine 15 MG capsule, TAKE 1 CAPSULE BY MOUTH EVERY DAY IN THE MORNING, Disp: 30 capsule, Rfl: 1   predniSONE (DELTASONE) 5 MG tablet, Take 5 mg by mouth as directed. PRN, Disp: , Rfl:    PULMICORT FLEXHALER 180 MCG/ACT inhaler, Inhale 2 puffs into the lungs in the morning and at bedtime., Disp: 1 each, Rfl: 5   traMADol (ULTRAM) 50 MG tablet, Take 1 tablet (50 mg total) by mouth every 6 (six) hours as needed., Disp: 30 tablet, Rfl: 1   amLODipine (NORVASC) 5 MG tablet, Take 1 tablet (5 mg total) by mouth daily., Disp: 30 tablet, Rfl: 11   norethindrone (MICRONOR) 0.35 MG tablet, , Disp: , Rfl:    Allergies  Allergen Reactions   Beef-Derived Products Hives and Itching   Gadolinium Derivatives Hives, Itching and Nausea And Vomiting    Pt vomited immediately during injection.  15 minutes  later, pt developed hives and itching.    Other Hives and Itching    Lamb   Shellfish Allergy Hives   Sulfa Antibiotics Other (See Comments)    Patient was told to NOT TAKE THIS   Sulfamethoxazole-Trimethoprim Other (See Comments)    Other reaction(s): hives   Iohexol Hives and Rash    08-2018, rash and hives, needs pre meds S/W PATIENT STATED REACTION FROM CT CONTRAST 11/19      The patient states she is status post hysterectomy.  Patient's last menstrual period was 07/06/2019 (approximate). Negative for Dysmenorrhea and Negative for Menorrhagia. Negative for: breast discharge, breast lump(s), breast pain and breast self exam. Associated symptoms include abnormal vaginal bleeding. Pertinent negatives include abnormal bleeding (hematology), anxiety, decreased libido, depression, difficulty falling sleep, dyspareunia, history of infertility, nocturia, sexual dysfunction, sleep disturbances, urinary incontinence, urinary urgency, vaginal discharge and vaginal itching. Diet regular.  The patient states her exercise level is in PT 2 times a week. She is exercising at least 3 times a week in addition to PT.   The patient's tobacco use is:  Social History   Tobacco Use  Smoking Status Never  Smokeless Tobacco Never   She has been exposed to passive smoke. The patient's alcohol use is:  Social History   Substance and Sexual Activity  Alcohol Use Yes   Comment: occassional  Additional information: Last pap 05/2022 at [facility], reports she is to continue getting PAPs regularly even with a hysterectomy, next one scheduled for 05/2025.    Review of Systems  Constitutional: Negative.   HENT: Negative.    Eyes: Negative.   Respiratory: Negative.    Cardiovascular: Negative.   Gastrointestinal: Negative.   Endocrine: Negative.   Genitourinary: Negative.   Musculoskeletal: Negative.   Skin: Negative.   Allergic/Immunologic: Negative.   Neurological: Negative.   Hematological: Negative.    Psychiatric/Behavioral: Negative.       Today's Vitals   07/22/22 1450 07/22/22 1558  BP: (!) 130/100 (!) 126/90  Pulse: 81   Temp: 98.1 F (36.7 C)   SpO2: 98%   Weight: 202 lb 12.8 oz (92 kg)   Height: '5\' 5"'$  (1.651 m)   PainSc: 0-No pain    Body mass index is 33.75 kg/m.  Wt Readings from Last 3 Encounters:  07/22/22 202 lb 12.8 oz (92 kg)  06/24/22 204 lb 12.8 oz (92.9 kg)  06/09/22 205 lb 12.8 oz (93.4 kg)    Objective:  Physical Exam Vitals reviewed.  Constitutional:  General: She is not in acute distress.    Appearance: Normal appearance. She is well-developed. She is obese.  HENT:     Head: Normocephalic and atraumatic.     Right Ear: Hearing, tympanic membrane, ear canal and external ear normal. There is no impacted cerumen.     Left Ear: Hearing, tympanic membrane, ear canal and external ear normal. There is no impacted cerumen.     Nose: Nose normal.     Mouth/Throat:     Mouth: Mucous membranes are moist.  Eyes:     General: Lids are normal.     Extraocular Movements: Extraocular movements intact.     Conjunctiva/sclera: Conjunctivae normal.     Pupils: Pupils are equal, round, and reactive to light.     Funduscopic exam:    Right eye: No papilledema.        Left eye: No papilledema.  Neck:     Thyroid: No thyroid mass.     Vascular: No carotid bruit.  Cardiovascular:     Rate and Rhythm: Normal rate and regular rhythm.     Pulses: Normal pulses.     Heart sounds: Normal heart sounds. No murmur heard. Pulmonary:     Effort: Pulmonary effort is normal. No respiratory distress.     Breath sounds: Normal breath sounds. No wheezing.  Chest:     Chest wall: No mass.  Breasts:    Tanner Score is 5.     Right: Normal. No mass or tenderness.     Left: Normal. No mass or tenderness.  Abdominal:     General: Abdomen is flat. Bowel sounds are normal. There is no distension.     Palpations: Abdomen is soft.     Tenderness: There is no abdominal  tenderness.  Genitourinary:    Rectum: Guaiac result negative.  Musculoskeletal:        General: No swelling. Normal range of motion.     Cervical back: Full passive range of motion without pain, normal range of motion and neck supple.     Right lower leg: No edema.     Left lower leg: No edema.  Lymphadenopathy:     Upper Body:     Right upper body: No supraclavicular, axillary or pectoral adenopathy.     Left upper body: No supraclavicular, axillary or pectoral adenopathy.  Skin:    General: Skin is warm and dry.     Capillary Refill: Capillary refill takes less than 2 seconds.  Neurological:     General: No focal deficit present.     Mental Status: She is alert and oriented to person, place, and time.     Cranial Nerves: No cranial nerve deficit.     Sensory: No sensory deficit.  Psychiatric:        Mood and Affect: Mood normal.        Behavior: Behavior normal.        Thought Content: Thought content normal.        Judgment: Judgment normal.         Assessment And Plan:     1. Encounter for annual health examination Behavior modifications discussed and diet history reviewed.   Pt will continue to exercise regularly and modify diet with low GI, plant based foods and decrease intake of processed foods.  Recommend intake of daily multivitamin, Vitamin D, and calcium.  Recommend for preventive screenings, as well as recommend immunizations that include influenza, TDAP. Discussed importance of self breast exams monthly.  2. Encounter for HIV (human immunodeficiency virus) test - HIV Antibody (routine testing w rflx)  3. Encounter for hepatitis C screening test for low risk patient Will check Hepatitis C screening due to recent recommendations to screen all adults 18 years and older - Hepatitis C antibody  4. Screening for STD (sexually transmitted disease) - Hepatitis B surface antigen - RPR - Urine cytology ancillary only - HSV 1 and 2 Ab, IgG  5. Class 1 obesity  due to excess calories with serious comorbidity and body mass index (BMI) of 33.0 to 33.9 in adult She is encouraged to strive for BMI less than 30 to decrease cardiac risk. Advised to aim for at least 150 minutes of exercise per week.  Continue phentermine 15 mg daily .  F/u 2 months for weight check  6. Secondary hypertension Comments: May be related to her pain level, today blood pressure is better than last visit. Will start on amlodipine due to can still have damage related to HTN  - POCT Urinalysis Dipstick (81002) - Microalbumin / creatinine urine ratio - EKG 12-Lead - amLODipine (NORVASC) 5 MG tablet; Take 1 tablet (5 mg total) by mouth daily.  Dispense: 30 tablet; Refill: 11  7. Fibromyalgia Comments: Fairly controlled, continue with medications and treatment of pain  8. Iron deficiency anemia, unspecified iron deficiency anemia type Comments: Had 3 iron infusions will recheck levels.  - Iron, TIBC and Ferritin Panel  9. Current moderate episode of major depressive disorder without prior episode ([place]) Comments: Symptoms are worsening. Will add vraylar as she is already on Cymbalta. Will f/u in 4 weeks.  - cariprazine (VRAYLAR) 1.5 MG capsule; Take 1 capsule (1.5 mg total) by mouth daily.  Dispense: 30 capsule; Refill: 5  10. Influenza vaccination declined Patient declined influenza vaccination at this time. Patient is aware that influenza vaccine prevents illness in 70% of healthy people, and reduces hospitalizations to 30-70% in elderly. This vaccine is recommended annually. Education has been provided regarding the importance of this vaccine but patient still declined. Advised may receive this vaccine at local pharmacy or Health Dept.or vaccine clinic. Aware to provide a copy of the vaccination record if obtained from local pharmacy or Health Dept.  Pt is willing to accept risk associated with refusing vaccination.  11. COVID-19 vaccination declined Declines covid 19 vaccine.  Discussed risk of covid 58 and if she changes her mind about the vaccine to call the office. Education has been provided regarding the importance of this vaccine but patient still declined. Advised may receive this vaccine at local pharmacy or Health Dept.or vaccine clinic. Aware to provide a copy of the vaccination record if obtained from local pharmacy or Health Dept.  Encouraged to take multivitamin, vitamin d, vitamin c and zinc to increase immune system. Aware can call office if would like to have vaccine here at office. Verbalized acceptance and understanding.   Patient was given opportunity to ask questions. Patient verbalized understanding of the plan and was able to repeat key elements of the plan. All questions were answered to their satisfaction.   Minette Brine, FNP   I, Minette Brine, FNP, have reviewed all documentation for this visit. The documentation on 07/22/22 for the exam, diagnosis, procedures, and orders are all accurate and complete.   THE PATIENT IS ENCOURAGED TO PRACTICE SOCIAL DISTANCING DUE TO THE COVID-19 PANDEMIC.

## 2022-07-23 LAB — IRON,TIBC AND FERRITIN PANEL
Ferritin: 172 ng/mL — ABNORMAL HIGH (ref 15–150)
Iron Saturation: 12 % — ABNORMAL LOW (ref 15–55)
Iron: 36 ug/dL (ref 27–159)
Total Iron Binding Capacity: 307 ug/dL (ref 250–450)
UIBC: 271 ug/dL (ref 131–425)

## 2022-07-23 LAB — HSV 1 AND 2 AB, IGG
HSV 1 Glycoprotein G Ab, IgG: 58.7 index — ABNORMAL HIGH (ref 0.00–0.90)
HSV 2 IgG, Type Spec: <0.91 index (ref 0.00–0.90)

## 2022-07-23 LAB — RPR: RPR Ser Ql: NONREACTIVE

## 2022-07-23 LAB — MICROALBUMIN / CREATININE URINE RATIO
Creatinine, Urine: 320.8 mg/dL
Microalb/Creat Ratio: 3 mg/g creat (ref 0–29)
Microalbumin, Urine: 8.3 ug/mL

## 2022-07-23 LAB — HEPATITIS B SURFACE ANTIGEN: Hepatitis B Surface Ag: NEGATIVE

## 2022-07-23 LAB — HIV ANTIBODY (ROUTINE TESTING W REFLEX): HIV Screen 4th Generation wRfx: NONREACTIVE

## 2022-07-23 LAB — HEPATITIS C ANTIBODY: Hep C Virus Ab: NONREACTIVE

## 2022-07-24 LAB — URINE CYTOLOGY ANCILLARY ONLY
Bacterial Vaginitis-Urine: NEGATIVE
Candida Urine: POSITIVE — AB
Chlamydia: NEGATIVE
Comment: NEGATIVE
Comment: NEGATIVE
Comment: NORMAL
Neisseria Gonorrhea: NEGATIVE
Trichomonas: NEGATIVE

## 2022-07-29 ENCOUNTER — Encounter: Payer: Self-pay | Admitting: Nurse Practitioner

## 2022-08-10 NOTE — Patient Instructions (Incomplete)
Adverse food reaction - Avoid shellfish. In case of an allergic reaction, give Benadryl 4 teaspoonfuls every 4 hours, and if life-threatening symptoms occur, inject with EpiPen 0.3 mg.    Chronic hives and swelling  - Continue to avoid all red meat. In case of an allergic reaction, give Benadryl 4 teaspoonfuls every 4 hours, and if life-threatening symptoms occur, inject with EpiPen 0.3 mg. - For hive control recommend she start with a daily long-acting antihistamine like Zyrtec or Allegra.  If you still have hives on daily dosing then increase to twice a day dosing.    Perennial allergic rhinitis - your skin testing on 06/09/22 was positive  to major mold mix 4, German cockroach, and mite mix -Continue  avoidance measures as below -Can use antihistamine Zyrtec or Allegra as above as needed for environmental allergy symptoms -May use over-the-counter fluticasone nasal spray 1 to 2 sprays each nostril once a day as needed for stuffy nose.In the right nostril, point the applicator out toward the right ear. In the left nostril, point the applicator out toward the left ear   Adverse medication effect -Has had 2 reactions related to contrast.  There are premedication regimens for contrasted studies if she is not needing something emergent -Continue to avoid sulfa antibiotics  Shortness of breath  Albuterol inhaler 2 puffs every 4-6 hours as needed for cough/wheeze/shortness of breath/chest tightness.  May use 15-20 minutes prior to activity.   Monitor frequency of use.   Continue Flovent 110 mcg  2 puffs twice a day with spacer to help prevent cough and wheeze.  Reviewed proper technique.    Schedule a follow up appointment in 6-8 weeks with Dr. Nelva Bush or sooner if needed  Control of Cockroach Allergen  Cockroach allergen has been identified as an important cause of acute attacks of asthma, especially in urban settings.  There are fifty-five species of cockroach that exist in the [place],  however only three, the [place], [place] species produce allergen that can affect patients with Asthma.  Allergens can be obtained from fecal particles, egg casings and secretions from cockroaches.    Remove food sources. Reduce access to water. Seal access and entry points. Spray runways with 0.5-1% Diazinon or Chlorpyrifos Blow boric acid power under stoves and refrigerator. Place bait stations (hydramethylnon) at feeding sites.   Control of Dust Mite Allergen Dust mites play a major role in allergic asthma and rhinitis. They occur in environments with high humidity wherever human skin is found. Dust mites absorb humidity from the atmosphere (ie, they do not drink) and feed on organic matter (including shed human and animal skin). Dust mites are a microscopic type of insect that you cannot see with the naked eye. High levels of dust mites have been detected from mattresses, pillows, carpets, upholstered furniture, bed covers, clothes, soft toys and any woven material. The principal allergen of the dust mite is found in its feces. A gram of dust may contain 1,000 mites and 250,000 fecal particles. Mite antigen is easily measured in the air during house cleaning activities. Dust mites do not bite and do not cause harm to humans, other than by triggering allergies/asthma.  Ways to decrease your exposure to dust mites in your home:  1. Encase mattresses, box springs and pillows with a mite-impermeable barrier or cover  2. Wash sheets, blankets and drapes weekly in hot water (130 F) with detergent and dry them in a dryer on the hot setting.  3. Have the  room cleaned frequently with a vacuum cleaner and a damp dust-mop. For carpeting or rugs, vacuuming with a vacuum cleaner equipped with a high-efficiency particulate air (HEPA) filter. The dust mite allergic individual should not be in a room which is being cleaned and should wait 1 hour after cleaning before going into the room.  4.  Do not sleep on upholstered furniture (eg, couches).  5. If possible removing carpeting, upholstered furniture and drapery from the home is ideal. Horizontal blinds should be eliminated in the rooms where the person spends the most time (bedroom, study, television room). Washable vinyl, roller-type shades are optimal.  6. Remove all non-washable stuffed toys from the bedroom. Wash stuffed toys weekly like sheets and blankets above.  7. Reduce indoor humidity to less than 50%. Inexpensive humidity monitors can be purchased at most hardware stores. Do not use a humidifier as can make the problem worse and are not recommended.  Control of Mold Allergen Mold and fungi can grow on a variety of surfaces provided certain temperature and moisture conditions exist.  Outdoor molds grow on plants, decaying vegetation and soil.  The major outdoor mold, Alternaria and Cladosporium, are found in very high numbers during hot and dry conditions.  Generally, a late Summer - Fall peak is seen for common outdoor fungal spores.  Rain will temporarily lower outdoor mold spore count, but counts rise rapidly when the rainy period ends.  The most important indoor molds are Aspergillus and Penicillium.  Dark, humid and poorly ventilated basements are ideal sites for mold growth.  The next most common sites of mold growth are the bathroom and the kitchen.  Outdoor [employer] Use air conditioning and keep windows closed Avoid exposure to decaying vegetation. Avoid leaf raking. Avoid grain handling. Consider wearing a face mask if working in moldy areas.  Indoor Mold Control Maintain humidity below 50%. Clean washable surfaces with 5% bleach solution. Remove sources e.g. Contaminated carpets.

## 2022-08-11 ENCOUNTER — Ambulatory Visit: Payer: BC Managed Care – PPO | Admitting: Family

## 2022-08-11 ENCOUNTER — Encounter: Payer: Self-pay | Admitting: Family

## 2022-08-11 VITALS — BP 148/92 | HR 90 | Temp 98.7°F | Resp 16 | Ht 64.0 in | Wt 204.4 lb

## 2022-08-11 DIAGNOSIS — R059 Cough, unspecified: Secondary | ICD-10-CM

## 2022-08-11 DIAGNOSIS — T783XXD Angioneurotic edema, subsequent encounter: Secondary | ICD-10-CM

## 2022-08-11 DIAGNOSIS — T50905D Adverse effect of unspecified drugs, medicaments and biological substances, subsequent encounter: Secondary | ICD-10-CM

## 2022-08-11 DIAGNOSIS — J3089 Other allergic rhinitis: Secondary | ICD-10-CM

## 2022-08-11 DIAGNOSIS — L508 Other urticaria: Secondary | ICD-10-CM

## 2022-08-11 DIAGNOSIS — T781XXD Other adverse food reactions, not elsewhere classified, subsequent encounter: Secondary | ICD-10-CM | POA: Diagnosis not present

## 2022-08-11 DIAGNOSIS — R0602 Shortness of breath: Secondary | ICD-10-CM | POA: Diagnosis not present

## 2022-08-11 NOTE — Progress Notes (Signed)
[place] [address] Dept: [phone]  FOLLOW UP NOTE  Patient ID: Gwendolyn Nguyen, female    DOB: Apr 21, 1986  Age: 36 y.o. MRN: [id number] Date of Office Visit: 08/11/2022  Assessment  Chief Complaint: Food Allergy (2 mth f/u - Patient states she has been avoiding all food allergens), Perennial Allergic Rhinitis (2 mth f/u - Pretty good), Urticaria (2 mth f/u - still having on/off hives, rashes and redness in her eyes and face.), and Shortness of Breath (2 mth f/u - Has gotten better when her back pain is good)  HPI Gwendolyn Nguyen is a 36 year old female who presents today for follow-up of perennial allergic rhinitis, shortness of breath, chronic urticaria, angioedema, adverse food reaction, adverse effect of drug, and cough.  She was last seen on June 09, 2022 by myself she denies any new diagnosis or surgery since her last visit.  Adverse food reaction: She continues to avoid shellfish without any accidental ingestion or use of her epinephrine autoinjector device.  Hives and swelling: She reports accidental ingestion of pepperoni or sausage pizza.  She thinks she had symptoms.  She has symptoms even when she is not eating red meat.  She will have hives that will occur almost every day. Some days they are more  noticeable than others.They do not last long.  She will take Zyrtec or Benadryl as needed.  She, but she does not take an antihistamine daily.  She does mention that her ear felt different one day.  Her eyes were "fluffing up".  She wondered if her make-up was still good.  Perennial allergic rhinitis: She reports rare rhinorrhea, nasal congestion and post nasal drip. She has these symptoms only if she is getting a cold. She takes Zyrtec or Benadryl just as needed.   She continues to avoid contrast and sulfa antibiotics.  Shortness of breath: she reports a cough that will occur randomly and she can feel drainage at times.She also feels tightness in chest and  shortness of breath occasionally. She also has nocturnal awakenings due to breathing problems at times. She has not required any systemic steroids or made any trips to the ER or urgent care due to breathing problems. She cannot tell if Flovent helps, but  she is only using albuterol once a week versus daily before.She will get symptoms when she has inflammation in her mid section of back or chest.  Drug Allergies:  Allergies  Allergen Reactions   Beef-Derived Products Hives and Itching   Gadolinium Derivatives Hives, Itching and Nausea And Vomiting    Pt vomited immediately during injection.  15 minutes later, pt developed hives and itching.    Other Hives and Itching    Lamb   Shellfish Allergy Hives   Sulfa Antibiotics Other (See Comments)    Patient was told to NOT TAKE THIS   Sulfamethoxazole-Trimethoprim Other (See Comments)    Other reaction(s): hives   Iohexol Hives and Rash    08-2018, rash and hives, needs pre meds S/W PATIENT STATED REACTION FROM CT CONTRAST 11/19    Review of Systems: Review of Systems  Constitutional:  Negative for chills and fever.       Reports that she has a hard time regulating her body temperature.  At times she will have hot flashes.  This mainly occurs when she is having pain.  HENT:         Reports rare rhinorrhea, nasal congestion, and postnasal drip.  She also reports dry mouth  Eyes:  Reports dry eyes for where she will use artificial tears  Respiratory:  Positive for cough and shortness of breath. Negative for wheezing.        Reports cough occasionally she feels it may be due to to drainage but also she can have a random cough.  Also reports tightness in her chest and shortness of breath.  She also has nocturnal awakenings due to breathing problems.  Cardiovascular:  Positive for chest pain.       Reports chest pain when she feels like she needs albuterol  Gastrointestinal:        Denies heartburn or reflux symptoms  Skin:  Positive for  itching and rash.       Reports itchy rash that occurs almost every day.  Neurological:  Negative for headaches.  Endo/Heme/Allergies:  Positive for environmental allergies.     Physical Exam: BP (!) 148/92   Pulse 90   Temp 98.7 F (37.1 C)   Resp 16   Ht '5\' 4"'$  (1.626 m)   Wt 204 lb 6.4 oz (92.7 kg)   LMP 07/06/2019 (Approximate)   SpO2 100%   BMI 35.09 kg/m    Physical Exam Constitutional:      Appearance: Normal appearance.  HENT:     Head: Normocephalic and atraumatic.     Comments: Pharynx normal, eyes normal, ears normal, nose: Bilateral lower turbinates mildly edematous and slightly erythematous with no drainage noted    Right Ear: Tympanic membrane, ear canal and external ear normal.     Left Ear: Tympanic membrane, ear canal and external ear normal.     Mouth/Throat:     Mouth: Mucous membranes are moist.     Pharynx: Oropharynx is clear.  Eyes:     Conjunctiva/sclera: Conjunctivae normal.  Cardiovascular:     Rate and Rhythm: Normal rate and regular rhythm.     Heart sounds: Normal heart sounds.  Pulmonary:     Effort: Pulmonary effort is normal.     Breath sounds: Normal breath sounds.     Comments: Lungs clear to auscultation Musculoskeletal:     Cervical back: Neck supple.  Skin:    General: Skin is warm.     Comments: No rashes or lesions noted on exposed skin.  Neurological:     Mental Status: She is alert and oriented to person, place, and time.  Psychiatric:        Mood and Affect: Mood normal.        Behavior: Behavior normal.        Thought Content: Thought content normal.        Judgment: Judgment normal.     Diagnostics: FVC 2.29 L (69%), FEV1 2.00 L (71%).  Predicted FVC 3.34 L, predicted FEV1 2.80 L.  Spirometry indicates possible mild restriction.  Spirometry is consistent with previous spirometry.  Assessment and Plan: 1. Chronic urticaria   2. Angioedema, subsequent encounter   3. Adverse food reaction, subsequent encounter   4.  Shortness of breath   5. Cough, unspecified type   6. Perennial allergic rhinitis   7. Adverse effect of drug, subsequent encounter     No orders of the defined types were placed in this encounter.   Patient Instructions  Adverse food reaction - Avoid shellfish. In case of an allergic reaction, give Benadryl 4 teaspoonfuls every 4 hours, and if life-threatening symptoms occur, inject with EpiPen 0.3 mg.    Chronic hives and swelling  - Continue to avoid all red meat. In  case of an allergic reaction, give Benadryl 4 teaspoonfuls every 4 hours, and if life-threatening symptoms occur, inject with EpiPen 0.3 mg. - For hive control recommend she start with a daily long-acting antihistamine like Zyrtec or Allegra.  If you still have hives on daily dosing then increase to twice a day dosing.    Perennial allergic rhinitis - your skin testing on 06/09/22 was positive  to major mold mix 4, German cockroach, and mite mix -Continue  avoidance measures as below -Can use antihistamine Zyrtec or Allegra as above as needed for environmental allergy symptoms -May use over-the-counter fluticasone nasal spray 1 to 2 sprays each nostril once a day as needed for stuffy nose.In the right nostril, point the applicator out toward the right ear. In the left nostril, point the applicator out toward the left ear   Adverse medication effect -Has had 2 reactions related to contrast.  There are premedication regimens for contrasted studies if she is not needing something emergent -Continue to avoid sulfa antibiotics  Shortness of breath  Albuterol inhaler 2 puffs every 4-6 hours as needed for cough/wheeze/shortness of breath/chest tightness.  May use 15-20 minutes prior to activity.   Monitor frequency of use.   Continue Flovent 110 mcg  2 puffs twice a day with spacer to help prevent cough and wheeze.  Reviewed proper technique.    Schedule a follow up appointment in 6-8 weeks with Dr. Nelva Bush or sooner if  needed  Control of Cockroach Allergen  Cockroach allergen has been identified as an important cause of acute attacks of asthma, especially in urban settings.  There are fifty-five species of cockroach that exist in the [place], however only three, the [place], [place] species produce allergen that can affect patients with Asthma.  Allergens can be obtained from fecal particles, egg casings and secretions from cockroaches.    Remove food sources. Reduce access to water. Seal access and entry points. Spray runways with 0.5-1% Diazinon or Chlorpyrifos Blow boric acid power under stoves and refrigerator. Place bait stations (hydramethylnon) at feeding sites.   Control of Dust Mite Allergen Dust mites play a major role in allergic asthma and rhinitis. They occur in environments with high humidity wherever human skin is found. Dust mites absorb humidity from the atmosphere (ie, they do not drink) and feed on organic matter (including shed human and animal skin). Dust mites are a microscopic type of insect that you cannot see with the naked eye. High levels of dust mites have been detected from mattresses, pillows, carpets, upholstered furniture, bed covers, clothes, soft toys and any woven material. The principal allergen of the dust mite is found in its feces. A gram of dust may contain 1,000 mites and 250,000 fecal particles. Mite antigen is easily measured in the air during house cleaning activities. Dust mites do not bite and do not cause harm to humans, other than by triggering allergies/asthma.  Ways to decrease your exposure to dust mites in your home:  1. Encase mattresses, box springs and pillows with a mite-impermeable barrier or cover  2. Wash sheets, blankets and drapes weekly in hot water (130 F) with detergent and dry them in a dryer on the hot setting.  3. Have the room cleaned frequently with a vacuum cleaner and a damp dust-mop. For carpeting or rugs, vacuuming  with a vacuum cleaner equipped with a high-efficiency particulate air (HEPA) filter. The dust mite allergic individual should not be in a room which is being cleaned and  should wait 1 hour after cleaning before going into the room.  4. Do not sleep on upholstered furniture (eg, couches).  5. If possible removing carpeting, upholstered furniture and drapery from the home is ideal. Horizontal blinds should be eliminated in the rooms where the person spends the most time (bedroom, study, television room). Washable vinyl, roller-type shades are optimal.  6. Remove all non-washable stuffed toys from the bedroom. Wash stuffed toys weekly like sheets and blankets above.  7. Reduce indoor humidity to less than 50%. Inexpensive humidity monitors can be purchased at most hardware stores. Do not use a humidifier as can make the problem worse and are not recommended.  Control of Mold Allergen Mold and fungi can grow on a variety of surfaces provided certain temperature and moisture conditions exist.  Outdoor molds grow on plants, decaying vegetation and soil.  The major outdoor mold, Alternaria and Cladosporium, are found in very high numbers during hot and dry conditions.  Generally, a late Summer - Fall peak is seen for common outdoor fungal spores.  Rain will temporarily lower outdoor mold spore count, but counts rise rapidly when the rainy period ends.  The most important indoor molds are Aspergillus and Penicillium.  Dark, humid and poorly ventilated basements are ideal sites for mold growth.  The next most common sites of mold growth are the bathroom and the kitchen.  Outdoor [employer] Use air conditioning and keep windows closed Avoid exposure to decaying vegetation. Avoid leaf raking. Avoid grain handling. Consider wearing a face mask if working in moldy areas.  Indoor Mold Control Maintain humidity below 50%. Clean washable surfaces with 5% bleach solution. Remove sources e.g. Contaminated  carpets.  Return in about 6 weeks (around 09/22/2022), or if symptoms worsen or fail to improve.    Thank you for the opportunity to care for this patient.  Please do not hesitate to contact me with questions.  Althea Charon, FNP [facility]

## 2022-08-15 ENCOUNTER — Ambulatory Visit: Payer: BC Managed Care – PPO | Admitting: Allergy

## 2022-08-19 ENCOUNTER — Ambulatory Visit: Payer: BC Managed Care – PPO

## 2022-08-19 ENCOUNTER — Ambulatory Visit: Payer: BC Managed Care – PPO | Admitting: Nurse Practitioner

## 2022-08-19 VITALS — BP 150/98 | HR 96 | Temp 98.2°F

## 2022-08-19 DIAGNOSIS — I159 Secondary hypertension, unspecified: Secondary | ICD-10-CM

## 2022-08-19 MED ORDER — HYDROCHLOROTHIAZIDE 25 MG PO TABS: 25.0000 mg | ORAL_TABLET | Freq: Every day | ORAL | 1 refills | Status: DC

## 2022-08-19 NOTE — Patient Instructions (Signed)
Hypertension, Adult High blood pressure (hypertension) is when the force of blood pumping through the arteries is too strong. The arteries are the blood vessels that carry blood from the heart throughout the body. Hypertension forces the heart to work harder to pump blood and may cause arteries to become narrow or stiff. Untreated or uncontrolled hypertension can lead to a heart attack, heart failure, a stroke, kidney disease, and other problems. A blood pressure reading consists of a higher number over a lower number. Ideally, your blood pressure should be below 120/80. The first ("top") number is called the systolic pressure. It is a measure of the pressure in your arteries as your heart beats. The second ("bottom") number is called the diastolic pressure. It is a measure of the pressure in your arteries as the heart relaxes. What are the causes? The exact cause of this condition is not known. There are some conditions that result in high blood pressure. What increases the risk? Certain factors may make you more likely to develop high blood pressure. Some of these risk factors are under your control, including: Smoking. Not getting enough exercise or physical activity. Being overweight. Having too much fat, sugar, calories, or salt (sodium) in your diet. Drinking too much alcohol. Other risk factors include: Having a personal history of heart disease, diabetes, high cholesterol, or kidney disease. Stress. Having a family history of high blood pressure and high cholesterol. Having obstructive sleep apnea. Age. The risk increases with age. What are the signs or symptoms? High blood pressure may not cause symptoms. Very high blood pressure (hypertensive crisis) may cause: Headache. Fast or irregular heartbeats (palpitations). Shortness of breath. Nosebleed. Nausea and vomiting. Vision changes. Severe chest pain, dizziness, and seizures. How is this diagnosed? This condition is diagnosed by  measuring your blood pressure while you are seated, with your arm resting on a flat surface, your legs uncrossed, and your feet flat on the floor. The cuff of the blood pressure monitor will be placed directly against the skin of your upper arm at the level of your heart. Blood pressure should be measured at least twice using the same arm. Certain conditions can cause a difference in blood pressure between your right and left arms. If you have a high blood pressure reading during one visit or you have normal blood pressure with other risk factors, you may be asked to: Return on a different day to have your blood pressure checked again. Monitor your blood pressure at home for 1 week or longer. If you are diagnosed with hypertension, you may have other blood or imaging tests to help your health care provider understand your overall risk for other conditions. How is this treated? This condition is treated by making healthy lifestyle changes, such as eating healthy foods, exercising more, and reducing your alcohol intake. You may be referred for counseling on a healthy diet and physical activity. Your health care provider may prescribe medicine if lifestyle changes are not enough to get your blood pressure under control and if: Your systolic blood pressure is above 130. Your diastolic blood pressure is above 80. Your personal target blood pressure may vary depending on your medical conditions, your age, and other factors. Follow these instructions at home: Eating and drinking  Eat a diet that is high in fiber and potassium, and low in sodium, added sugar, and fat. An example of this eating plan is called the DASH diet. DASH stands for Dietary Approaches to Stop Hypertension. To eat this way: Eat   plenty of fresh fruits and vegetables. Try to fill one half of your plate at each meal with fruits and vegetables. Eat whole grains, such as whole-wheat pasta, brown rice, or whole-grain bread. Fill about one  fourth of your plate with whole grains. Eat or drink low-fat dairy products, such as skim milk or low-fat yogurt. Avoid fatty cuts of meat, processed or cured meats, and poultry with skin. Fill about one fourth of your plate with lean proteins, such as fish, chicken without skin, beans, eggs, or tofu. Avoid pre-made and processed foods. These tend to be higher in sodium, added sugar, and fat. Reduce your daily sodium intake. Many people with hypertension should eat less than 1,500 mg of sodium a day. Do not drink alcohol if: Your health care provider tells you not to drink. You are pregnant, may be pregnant, or are planning to become pregnant. If you drink alcohol: Limit how much you have to: 0-1 drink a day for women. 0-2 drinks a day for men. Know how much alcohol is in your drink. In the [place] one drink equals one 12 oz bottle of beer (355 mL), one 5 oz glass of wine (148 mL), or one 1 oz glass of hard liquor (44 mL). Lifestyle  Work with your health care provider to maintain a healthy body weight or to lose weight. Ask what an ideal weight is for you. Get at least 30 minutes of exercise that causes your heart to beat faster (aerobic exercise) most days of the week. Activities may include walking, swimming, or biking. Include exercise to strengthen your muscles (resistance exercise), such as Pilates or lifting weights, as part of your weekly exercise routine. Try to do these types of exercises for 30 minutes at least 3 days a week. Do not use any products that contain nicotine or tobacco. These products include cigarettes, chewing tobacco, and vaping devices, such as e-cigarettes. If you need help quitting, ask your health care provider. Monitor your blood pressure at home as told by your health care provider. Keep all follow-up visits. This is important. Medicines Take over-the-counter and prescription medicines only as told by your health care provider. Follow directions carefully. Blood  pressure medicines must be taken as prescribed. Do not skip doses of blood pressure medicine. Doing this puts you at risk for problems and can make the medicine less effective. Ask your health care provider about side effects or reactions to medicines that you should watch for. Contact a health care provider if you: Think you are having a reaction to a medicine you are taking. Have headaches that keep coming back (recurring). Feel dizzy. Have swelling in your ankles. Have trouble with your vision. Get help right away if you: Develop a severe headache or confusion. Have unusual weakness or numbness. Feel faint. Have severe pain in your chest or abdomen. Vomit repeatedly. Have trouble breathing. These symptoms may be an emergency. Get help right away. Call 911. Do not wait to see if the symptoms will go away. Do not drive yourself to the hospital. Summary Hypertension is when the force of blood pumping through your arteries is too strong. If this condition is not controlled, it may put you at risk for serious complications. Your personal target blood pressure may vary depending on your medical conditions, your age, and other factors. For most people, a normal blood pressure is less than 120/80. Hypertension is treated with lifestyle changes, medicines, or a combination of both. Lifestyle changes include losing weight, eating a healthy,   low-sodium diet, exercising more, and limiting alcohol. This information is not intended to replace advice given to you by your health care provider. Make sure you discuss any questions you have with your health care provider. Document Revised: 08/13/2021 Document Reviewed: 08/13/2021 Elsevier Patient Education  2023 [employer]  

## 2022-08-19 NOTE — Progress Notes (Signed)
Patient presents today for BPC. She is currently taking Amlodipine '5mg'$ .   BP Readings from Last 3 Encounters:  08/19/22 (!) 150/98  08/11/22 (!) 148/92  07/22/22 (!) 126/90   Provider would like to start hctz '25mg'$ . Patient understands that she must increase water intake. Patient feels that he blood pressure will get better once pain is controlled.

## 2022-08-27 ENCOUNTER — Encounter: Payer: Self-pay | Admitting: Nurse Practitioner

## 2022-08-30 ENCOUNTER — Telehealth: Payer: Self-pay | Admitting: Oncology

## 2022-08-30 NOTE — Telephone Encounter (Signed)
Called patient regarding January appointment and upcoming changes. Left a voicemail.

## 2022-09-01 ENCOUNTER — Other Ambulatory Visit: Payer: Self-pay | Admitting: Nurse Practitioner

## 2022-09-01 DIAGNOSIS — M797 Fibromyalgia: Secondary | ICD-10-CM

## 2022-09-01 DIAGNOSIS — M2550 Pain in unspecified joint: Secondary | ICD-10-CM

## 2022-09-01 NOTE — Telephone Encounter (Signed)
Yes she will need an appt due to it being more than a month since in reference to this problem.

## 2022-09-03 ENCOUNTER — Other Ambulatory Visit: Payer: Self-pay | Admitting: Pain Medicine

## 2022-09-03 ENCOUNTER — Ambulatory Visit
Admission: RE | Admit: 2022-09-03 | Discharge: 2022-09-03 | Disposition: A | Discharge status: Home / Self Care | Payer: BC Managed Care – PPO | Source: Ambulatory Visit | Attending: Pain Medicine | Admitting: Pain Medicine

## 2022-09-03 DIAGNOSIS — M47817 Spondylosis without myelopathy or radiculopathy, lumbosacral region: Secondary | ICD-10-CM

## 2022-09-10 ENCOUNTER — Ambulatory Visit: Payer: BC Managed Care – PPO | Admitting: Nurse Practitioner

## 2022-09-10 ENCOUNTER — Ambulatory Visit (INDEPENDENT_AMBULATORY_CARE_PROVIDER_SITE_OTHER): Payer: BC Managed Care – PPO | Admitting: Allergy

## 2022-09-10 ENCOUNTER — Other Ambulatory Visit: Payer: Self-pay

## 2022-09-10 ENCOUNTER — Ambulatory Visit: Payer: BC Managed Care – PPO | Admitting: Allergy

## 2022-09-10 ENCOUNTER — Encounter: Payer: Self-pay | Admitting: Allergy

## 2022-09-10 VITALS — Temp 96.3°F | Ht 64.0 in | Wt 206.0 lb

## 2022-09-10 DIAGNOSIS — T783XXD Angioneurotic edema, subsequent encounter: Secondary | ICD-10-CM

## 2022-09-10 DIAGNOSIS — T781XXD Other adverse food reactions, not elsewhere classified, subsequent encounter: Secondary | ICD-10-CM | POA: Diagnosis not present

## 2022-09-10 DIAGNOSIS — J3089 Other allergic rhinitis: Secondary | ICD-10-CM | POA: Diagnosis not present

## 2022-09-10 DIAGNOSIS — M47817 Spondylosis without myelopathy or radiculopathy, lumbosacral region: Secondary | ICD-10-CM | POA: Insufficient documentation

## 2022-09-10 DIAGNOSIS — Z889 Allergy status to unspecified drugs, medicaments and biological substances status: Secondary | ICD-10-CM

## 2022-09-10 DIAGNOSIS — L508 Other urticaria: Secondary | ICD-10-CM | POA: Diagnosis not present

## 2022-09-10 DIAGNOSIS — R0602 Shortness of breath: Secondary | ICD-10-CM

## 2022-09-10 DIAGNOSIS — M545 Low back pain, unspecified: Secondary | ICD-10-CM | POA: Insufficient documentation

## 2022-09-10 DIAGNOSIS — T50905D Adverse effect of unspecified drugs, medicaments and biological substances, subsequent encounter: Secondary | ICD-10-CM

## 2022-09-10 DIAGNOSIS — M2550 Pain in unspecified joint: Secondary | ICD-10-CM | POA: Insufficient documentation

## 2022-09-10 MED ORDER — FAMOTIDINE 20 MG PO TABS: 20.0000 mg | ORAL_TABLET | Freq: Two times a day (BID) | ORAL | 5 refills | Status: DC

## 2022-09-10 MED ORDER — ALBUTEROL SULFATE HFA 108 (90 BASE) MCG/ACT IN AERS: 2.0000 | INHALATION_SPRAY | Freq: Four times a day (QID) | RESPIRATORY_TRACT | 1 refills | Status: DC | PRN

## 2022-09-10 MED ORDER — LEVOCETIRIZINE DIHYDROCHLORIDE 5 MG PO TABS: 5.0000 mg | ORAL_TABLET | Freq: Two times a day (BID) | ORAL | 5 refills | Status: DC

## 2022-09-10 NOTE — Progress Notes (Signed)
RE: Gwendolyn Nguyen MRN: [id number] DOB: 12-21-1985 Date of Telemedicine Visit: 09/10/2022   Primary care provider: Minette Brine, FNP  Chief Complaint: Urticaria (Still having issues even with taking allergy medications ), Allergic Rhinitis  (No issues ), and Asthma (Has not needed albuterol inhaler but once since last visit )   Telemedicine Follow Up Visit via Telephone: I connected with Gwendolyn Nguyen for a follow up on 09/10/22 by telephone and verified that I am speaking with the correct person using two identifiers.   I discussed the limitations, risks, security and privacy concerns of performing an evaluation and management service by telephone and the availability of in person appointments. I also discussed with the patient that there may be a patient responsible charge related to this service. The patient expressed understanding and agreed to proceed.  Patient is at home. Provider is at the office.  Visit start time: 12:00 Visit end time: 12:22 Insurance consent/check in by: [facility] consent and medical assistant/nurse: Gwendolyn Nguyen  History of Present Illness: She is a 36 y.o. female, who is being followed for urticaria with angioedema, food reactions, reactive airway, allergic rhinitis and drug allergy. Her previous allergy office visit was on 08/11/22 with  Gwendolyn Coho FNP She is still having issues with hives even with the allergy medications on board. She states the medications are helping she is noticing less frequent flares but she is still having high flares more than she would like.   She states recently she had bad itching all night that kept her awake.  She is taking Zyrtec at this time 1 tablet a day.  Not currently taking Pepcid. She is avoiding red meat as well as shellfish in diet.  She is not sure if something else is triggering the hives.  She does note joint aches and pains. She states her breathing has been doing quite well.  She has needed to use albuterol once  for a panic attack since the last visit.  She continues to use Flovent medium dose 2 puffs once a day with spacer device.  She has not had any systemic steroid needs or ED or urgent care visits. She has her allergy symptoms have been quite stable at this time as well.  She is not noting any significant nasal, ocular symptoms. She continues to avoid the medications including sulfa and contrast.  Assessment and Plan: Gwendolyn Nguyen is a 36 y.o. female with: Chronic hives and swelling - Etiology of hives is most likely spontaneous in nature as she continues to have episodes of hives despite food avoidance below.  Hives can be caused by a variety of different triggers including illness/infection, foods, medications, stings, exercise, pressure, vibrations, extremes of temperature to name a few however majority of the time there is no identifiable trigger.   - For hive control recommend she start with a daily long-acting antihistamine like Zyrtec or Allegra 1-2 times a day AND Pepcid 1-2 times a day.   Zyrtec/Allegra medications are primary antihistamines or H1 blockers.  Pepcid is a secondary antihistamine and H2 blocker.  You can take Pepcid alongside omeprazole for reflux as these medicines work differently in the gut tract. -If H1 and H2 blockade is not effective enough in controlling hives or swelling then would recommend proceeding with Xolair monthly injections.  This was briefly discussed today  Adverse food reaction - Avoid shellfish and red meat. In case of an allergic reaction, give Benadryl 4 teaspoonfuls every 4 hours, and if life-threatening symptoms occur, inject with EpiPen  0.3 mg.  Perennial allergic rhinitis -Continue avoidance measures for mold, cockroach and dust mites -Can use antihistamine Zyrtec or Allegra as above as needed for environmental allergy symptoms -May use over-the-counter fluticasone nasal spray 1 to 2 sprays each nostril once a day as needed for stuffy nose.In the right  nostril, point the applicator out toward the right ear. In the left nostril, point the applicator out toward the left ear  Shortness of breath  Albuterol inhaler 2 puffs every 4-6 hours as needed for cough/wheeze/shortness of breath/chest tightness.  May use 15-20 minutes prior to activity.   Monitor frequency of use.   Continue Flovent 110 mcg  2 puffs twice a day with spacer to help prevent cough and wheeze.    Control goals:  Full participation in all desired activities (may need albuterol before activity) Albuterol use two time or less a week on average (not counting use with activity) Cough interfering with sleep two time or less a month Oral steroids no more than once a year No hospitalizations  Adverse medication effect -Has had 2 reactions related to contrast.  There are premedication regimens for contrasted studies if she is not needing emergent study requiring contrast -Continue to avoid sulfa antibiotics  Schedule a follow up in 3 months or sooner if needed   Diagnostics: None.  Medication List:  Current Outpatient Medications  Medication Sig Dispense Refill   albuterol (VENTOLIN HFA) 108 (90 Base) MCG/ACT inhaler Inhale 2 puffs into the lungs every 6 (six) hours as needed for wheezing or shortness of breath. 18 g 1   albuterol (VENTOLIN HFA) 108 (90 Base) MCG/ACT inhaler Inhale 2 puffs into the lungs every 6 (six) hours as needed for wheezing or shortness of breath. 18 g 1   amLODipine (NORVASC) 5 MG tablet Take 1 tablet (5 mg total) by mouth daily. 30 tablet 11   cariprazine (VRAYLAR) 1.5 MG capsule Take 1 capsule (1.5 mg total) by mouth daily. 30 capsule 5   cyclobenzaprine (FLEXERIL) 5 MG tablet Take 5 mg by mouth as directed. 2 times a day per GYN     dicyclomine (BENTYL) 20 MG tablet Take 20 mg by mouth 3 (three) times daily.     DULoxetine (CYMBALTA) 30 MG capsule Take 2 capsules by mouth daily 120 capsule 1   EPINEPHrine (EPIPEN 2-PAK) 0.3 mg/0.3 mL IJ SOAJ  injection Inject 0.3 mg into the muscle as needed for anaphylaxis. 1 each 1   famotidine (PEPCID) 20 MG tablet Take 1 tablet (20 mg total) by mouth 2 (two) times daily. 60 tablet 5   fluticasone (FLOVENT HFA) 110 MCG/ACT inhaler Inhale 2 puffs twice a day with spacer to help prevent cough and wheeze 1 each 1   gabapentin (NEURONTIN) 100 MG capsule Take 1 capsule (100 mg total) by mouth daily. Take with 300 mg tab total 400 mg TID 90 capsule 1   gabapentin (NEURONTIN) 300 MG capsule Take 1 capsule (300 mg total) by mouth 3 (three) times daily. 90 capsule 2   hydrochlorothiazide (HYDRODIURIL) 25 MG tablet Take 1 tablet (25 mg total) by mouth daily. 90 tablet 1   HYDROcodone-acetaminophen (NORCO/VICODIN) 5-325 MG tablet Take 1 tablet by mouth 2 (two) times daily as needed.     ketorolac (TORADOL) 10 MG tablet Take 1 tablet (10 mg total) by mouth every 6 (six) hours as needed. 20 tablet 2   levocetirizine (XYZAL) 5 MG tablet Take 1 tablet (5 mg total) by mouth 2 (two) times daily. [place]  tablet 5   omeprazole (PRILOSEC) 40 MG capsule Take 40 mg by mouth daily. As directed by GI     oxybutynin (DITROPAN) 5 MG tablet Take 5 mg by mouth every morning.     phentermine 15 MG capsule TAKE 1 CAPSULE BY MOUTH EVERY DAY IN THE MORNING 30 capsule 1   PULMICORT FLEXHALER 180 MCG/ACT inhaler Inhale 2 puffs into the lungs in the morning and at bedtime. 1 each 5   traMADol (ULTRAM) 50 MG tablet Take 1 tablet (50 mg total) by mouth every 6 (six) hours as needed. 30 tablet 1   venlafaxine XR (EFFEXOR-XR) 75 MG 24 hr capsule Take 75 mg by mouth daily.     No current facility-administered medications for this visit.   Allergies: Allergies  Allergen Reactions   Beef-Derived Products Hives and Itching   Gadolinium Derivatives Hives, Itching and Nausea And Vomiting    Pt vomited immediately during injection.  15 minutes later, pt developed hives and itching.    Other Hives and Itching    Lamb   Shellfish Allergy Hives    Sulfa Antibiotics Other (See Comments)    Patient was told to NOT TAKE THIS   Sulfamethoxazole-Trimethoprim Other (See Comments)    Other reaction(s): hives   Iohexol Hives and Rash    08-2018, rash and hives, needs pre meds S/W PATIENT STATED REACTION FROM CT CONTRAST 11/19   I reviewed her past medical history, social history, family history, and environmental history and no significant changes have been reported from previous visit on 08/11/22.  Review of Systems  Constitutional: Negative.   HENT: Negative.    Eyes: Negative.   Respiratory: Negative.    Cardiovascular: Negative.   Gastrointestinal: Negative.   Musculoskeletal: Negative.   Skin:  Positive for rash.  Allergic/Immunologic: Negative.   Neurological: Negative.    Objective: Physical Exam Not obtained as encounter was done via telephone.   Previous notes and tests were reviewed.  I discussed the assessment and treatment plan with the patient. The patient was provided an opportunity to ask questions and all were answered. The patient agreed with the plan and demonstrated an understanding of the instructions.   The patient was advised to call back or seek an in-person evaluation if the symptoms worsen or if the condition fails to improve as anticipated.  I provided 22 minutes of non-face-to-face time during this encounter.  It was my pleasure to participate in Gwendolyn Nguyen's care today. Please feel free to contact me with any questions or concerns.   Sincerely,  Camille Thau Charmian Muff, MD

## 2022-09-10 NOTE — Patient Instructions (Addendum)
Chronic hives and swelling - Etiology of hives is most likely spontaneous in nature as she continues to have episodes of hives despite food avoidance below.  Hives can be caused by a variety of different triggers including illness/infection, foods, medications, stings, exercise, pressure, vibrations, extremes of temperature to name a few however majority of the time there is no identifiable trigger.   - For hive control recommend she start with a daily long-acting antihistamine like Zyrtec or Allegra 1-2 times a day AND Pepcid 1-2 times a day.   Zyrtec/Allegra medications are primary antihistamines or H1 blockers.  Pepcid is a secondary antihistamine and H2 blocker.  You can take Pepcid alongside omeprazole for reflux as these medicines work differently in the gut tract. -If H1 and H2 blockade is not effective enough in controlling hives or swelling then would recommend proceeding with Xolair monthly injections.  This was briefly discussed today  Adverse food reaction - Avoid shellfish and red meat. In case of an allergic reaction, give Benadryl 4 teaspoonfuls every 4 hours, and if life-threatening symptoms occur, inject with EpiPen 0.3 mg.  Perennial allergic rhinitis -Continue avoidance measures for mold, cockroach and dust mites -Can use antihistamine Zyrtec or Allegra as above as needed for environmental allergy symptoms -May use over-the-counter fluticasone nasal spray 1 to 2 sprays each nostril once a day as needed for stuffy nose.In the right nostril, point the applicator out toward the right ear. In the left nostril, point the applicator out toward the left ear  Shortness of breath  Albuterol inhaler 2 puffs every 4-6 hours as needed for cough/wheeze/shortness of breath/chest tightness.  May use 15-20 minutes prior to activity.   Monitor frequency of use.   Continue Flovent 110 mcg  2 puffs twice a day with spacer to help prevent cough and wheeze.    Control goals:  Full participation in  all desired activities (may need albuterol before activity) Albuterol use two time or less a week on average (not counting use with activity) Cough interfering with sleep two time or less a month Oral steroids no more than once a year No hospitalizations  Adverse medication effect -Has had 2 reactions related to contrast.  There are premedication regimens for contrasted studies if she is not needing emergent study requiring contrast -Continue to avoid sulfa antibiotics  Schedule a follow up in 3 months or sooner if needed

## 2022-09-15 ENCOUNTER — Ambulatory Visit: Payer: BC Managed Care – PPO | Admitting: Family

## 2022-09-17 ENCOUNTER — Encounter: Payer: Self-pay | Admitting: Nurse Practitioner

## 2022-09-24 ENCOUNTER — Encounter: Payer: Self-pay | Admitting: Allergy

## 2022-10-02 ENCOUNTER — Encounter: Payer: Self-pay | Admitting: Nurse Practitioner

## 2022-10-02 ENCOUNTER — Ambulatory Visit (INDEPENDENT_AMBULATORY_CARE_PROVIDER_SITE_OTHER): Payer: BC Managed Care – PPO | Admitting: Nurse Practitioner

## 2022-10-02 VITALS — BP 118/70 | HR 64 | Temp 98.7°F | Ht 64.0 in | Wt 207.0 lb

## 2022-10-02 DIAGNOSIS — R221 Localized swelling, mass and lump, neck: Secondary | ICD-10-CM | POA: Diagnosis not present

## 2022-10-02 DIAGNOSIS — M797 Fibromyalgia: Secondary | ICD-10-CM

## 2022-10-02 DIAGNOSIS — R21 Rash and other nonspecific skin eruption: Secondary | ICD-10-CM | POA: Diagnosis not present

## 2022-10-02 DIAGNOSIS — Z79899 Other long term (current) drug therapy: Secondary | ICD-10-CM

## 2022-10-02 DIAGNOSIS — R6889 Other general symptoms and signs: Secondary | ICD-10-CM

## 2022-10-02 NOTE — Progress Notes (Signed)
I,Tianna Badgett,acting as a Education administrator for [employer], FNP.,have documented all relevant documentation on the behalf of Minette Brine, FNP,as directed by  Minette Brine, FNP while in the presence of Minette Brine, [place].  Subjective:     Patient ID: Gwendolyn Nguyen , female    DOB: Apr 26, 1986 , 36 y.o.   MRN: [id number]   No chief complaint on file.   HPI  She is here today due to concerns about a rash to her face that is intermittent. It is red to face.   She has been on hormone therapy for the hot flashes but is now all day. She was on effexor in the past which what she feels was related to her hot flashes.   She is not taking Vraylar. She is only taking cymbalta.       Past Medical History:  Diagnosis Date   Anemia    Asthma    Back pain    Constipation    Endometriosis    IBS (irritable bowel syndrome)    Low hemoglobin    Multiple food allergies    PID (acute pelvic inflammatory disease) 10/930   complicated by tuboovarian abscess   TOA (tubo-ovarian abscess) 07/24/2019     Family History  Problem Relation Age of Onset   Hyperlipidemia Mother    Hypertension Mother    Obesity Mother    Hyperlipidemia Father    Hypertension Father    Obesity Father    Diabetes Maternal Grandmother    Heart disease Maternal Grandfather    Colon cancer Neg Hx    Esophageal cancer Neg Hx    Inflammatory bowel disease Neg Hx    Liver disease Neg Hx    Pancreatic cancer Neg Hx    Rectal cancer Neg Hx    Stomach cancer Neg Hx      Current Outpatient Medications:    albuterol (VENTOLIN HFA) 108 (90 Base) MCG/ACT inhaler, Inhale 2 puffs into the lungs every 6 (six) hours as needed for wheezing or shortness of breath., Disp: 18 g, Rfl: 1   albuterol (VENTOLIN HFA) 108 (90 Base) MCG/ACT inhaler, Inhale 2 puffs into the lungs every 6 (six) hours as needed for wheezing or shortness of breath., Disp: 18 g, Rfl: 1   amLODipine (NORVASC) 5 MG tablet, Take 1 tablet (5 mg total) by mouth  daily., Disp: 30 tablet, Rfl: 11   cariprazine (VRAYLAR) 1.5 MG capsule, Take 1 capsule (1.5 mg total) by mouth daily., Disp: 30 capsule, Rfl: 5   cyclobenzaprine (FLEXERIL) 5 MG tablet, Take 5 mg by mouth as directed. 2 times a day per GYN, Disp: , Rfl:    dicyclomine (BENTYL) 20 MG tablet, Take 20 mg by mouth 3 (three) times daily., Disp: , Rfl:    DULoxetine (CYMBALTA) 30 MG capsule, Take 2 capsules by mouth daily, Disp: 120 capsule, Rfl: 1   EPINEPHrine (EPIPEN 2-PAK) 0.3 mg/0.3 mL IJ SOAJ injection, Inject 0.3 mg into the muscle as needed for anaphylaxis., Disp: 1 each, Rfl: 1   famotidine (PEPCID) 20 MG tablet, Take 1 tablet (20 mg total) by mouth 2 (two) times daily., Disp: 60 tablet, Rfl: 5   fluticasone (FLOVENT HFA) 110 MCG/ACT inhaler, Inhale 2 puffs twice a day with spacer to help prevent cough and wheeze, Disp: 1 each, Rfl: 1   gabapentin (NEURONTIN) 100 MG capsule, Take 1 capsule (100 mg total) by mouth daily. Take with 300 mg tab total 400 mg TID, Disp: 90 capsule, Rfl: 1  gabapentin (NEURONTIN) 300 MG capsule, Take 1 capsule (300 mg total) by mouth 3 (three) times daily., Disp: 90 capsule, Rfl: 2   hydrochlorothiazide (HYDRODIURIL) 25 MG tablet, Take 1 tablet (25 mg total) by mouth daily., Disp: 90 tablet, Rfl: 1   HYDROcodone-acetaminophen (NORCO/VICODIN) 5-325 MG tablet, Take 1 tablet by mouth 2 (two) times daily as needed., Disp: , Rfl:    ketorolac (TORADOL) 10 MG tablet, Take 1 tablet (10 mg total) by mouth every 6 (six) hours as needed., Disp: 20 tablet, Rfl: 2   levocetirizine (XYZAL) 5 MG tablet, Take 1 tablet (5 mg total) by mouth 2 (two) times daily., Disp: 60 tablet, Rfl: 5   omeprazole (PRILOSEC) 40 MG capsule, Take 40 mg by mouth daily. As directed by GI, Disp: , Rfl:    oxybutynin (DITROPAN) 5 MG tablet, Take 5 mg by mouth every morning., Disp: , Rfl:    phentermine 15 MG capsule, TAKE 1 CAPSULE BY MOUTH EVERY DAY IN THE MORNING, Disp: 30 capsule, Rfl: 1   PULMICORT  FLEXHALER 180 MCG/ACT inhaler, Inhale 2 puffs into the lungs in the morning and at bedtime., Disp: 1 each, Rfl: 5   traMADol (ULTRAM) 50 MG tablet, Take 1 tablet (50 mg total) by mouth every 6 (six) hours as needed., Disp: 30 tablet, Rfl: 1   venlafaxine XR (EFFEXOR-XR) 75 MG 24 hr capsule, Take 75 mg by mouth daily., Disp: , Rfl:    Allergies  Allergen Reactions   Beef-Derived Products Hives and Itching   Gadolinium Derivatives Hives, Itching and Nausea And Vomiting    Pt vomited immediately during injection.  15 minutes later, pt developed hives and itching.    Other Hives and Itching    Lamb   Shellfish Allergy Hives   Sulfa Antibiotics Other (See Comments)    Patient was told to NOT TAKE THIS   Sulfamethoxazole-Trimethoprim Other (See Comments)    Other reaction(s): hives   Iohexol Hives and Rash    08-2018, rash and hives, needs pre meds S/W PATIENT STATED REACTION FROM CT CONTRAST 11/19     Review of Systems  Constitutional: Negative.   Respiratory: Negative.    Cardiovascular: Negative.   Gastrointestinal: Negative.   Genitourinary: Negative.   Neurological: Negative.   Psychiatric/Behavioral: Negative.       Today's Vitals   10/02/22 0830  BP: 118/70  Pulse: 64  Temp: 98.7 F (37.1 C)  TempSrc: Oral  Weight: 207 lb (93.9 kg)  Height: 5' 4" (1.626 m)   Body mass index is 35.53 kg/m.   Objective:  Physical Exam Vitals reviewed.  Constitutional:      Appearance: Normal appearance.  Neck:     Comments: Neck fullness present more on right than left.  Cardiovascular:     Rate and Rhythm: Normal rate and regular rhythm.     Pulses: Normal pulses.     Heart sounds: Normal heart sounds. No murmur heard. Pulmonary:     Effort: Pulmonary effort is normal. No respiratory distress.     Breath sounds: Normal breath sounds. No wheezing.  Skin:    General: Skin is warm and dry.     Capillary Refill: Capillary refill takes less than 2 seconds.  Neurological:      General: No focal deficit present.     Mental Status: She is alert and oriented to person, place, and time.     Cranial Nerves: No cranial nerve deficit.     Motor: No weakness.  Psychiatric:  Mood and Affect: Mood normal.        Behavior: Behavior normal.        Thought Content: Thought content normal.        Judgment: Judgment normal.         Assessment And Plan:     1. Fullness of neck Comments: Will check thryoid ultrasound - US THYROID; Future - CMP14+EGFR  2. Sensation of change in body temperature Comments: Will check metabolic cause vs inflammatory response. - TSH + free T4 - Thyroid Peroxidase Antibody - Autoimmune Profile - US THYROID; Future - CMP14+EGFR - CBC - Iron, TIBC and Ferritin Panel  3. Rash and nonspecific skin eruption Comments: No current rash to face. This may be a response to inflammation  4. Other long term (current) drug therapy - CBC  5. Fibromyalgia Comments: No current pain, controlled at this time message in [place]. Continue f/u with Rheumatology. New referral to Rheumatolog sent to [facility] but office declined     Patient was given opportunity to ask questions. Patient verbalized understanding of the plan and was able to repeat key elements of the plan. All questions were answered to their satisfaction.  Minette Brine, FNP   I, Minette Brine, FNP, have reviewed all documentation for this visit. The documentation on 10/02/22 for the exam, diagnosis, procedures, and orders are all accurate and complete.   IF YOU HAVE BEEN REFERRED TO A SPECIALIST, IT MAY TAKE 1-2 WEEKS TO SCHEDULE/PROCESS THE REFERRAL. IF YOU HAVE NOT HEARD FROM US/SPECIALIST IN TWO WEEKS, PLEASE GIVE [place] A CALL AT [phone] X [phone].   THE PATIENT IS ENCOURAGED TO PRACTICE SOCIAL DISTANCING DUE TO THE COVID-19 PANDEMIC.

## 2022-10-03 LAB — CMP14+EGFR
ALT: 11 IU/L (ref 0–32)
AST: 14 IU/L (ref 0–40)
Albumin/Globulin Ratio: 1.5 (ref 1.2–2.2)
Albumin: 4.8 g/dL (ref 3.9–4.9)
Alkaline Phosphatase: 93 IU/L (ref 44–121)
BUN/Creatinine Ratio: 11 (ref 9–23)
BUN: 12 mg/dL (ref 6–20)
Bilirubin Total: 0.4 mg/dL (ref 0.0–1.2)
CO2: 27 mmol/L (ref 20–29)
Calcium: 10 mg/dL (ref 8.7–10.2)
Chloride: 100 mmol/L (ref 96–106)
Creatinine, Ser: 1.12 mg/dL — ABNORMAL HIGH (ref 0.57–1.00)
Globulin, Total: 3.1 g/dL (ref 1.5–4.5)
Glucose: 93 mg/dL (ref 70–99)
Potassium: 4.8 mmol/L (ref 3.5–5.2)
Sodium: 141 mmol/L (ref 134–144)
Total Protein: 7.9 g/dL (ref 6.0–8.5)
eGFR: 66 mL/min/{1.73_m2} (ref >59)

## 2022-10-03 LAB — AUTOIMMUNE PROFILE
Anti Nuclear Antibody (ANA): NEGATIVE
Complement C3, Serum: 178 mg/dL — ABNORMAL HIGH (ref 82–167)
dsDNA Ab: <1 IU/mL (ref 0–9)

## 2022-10-03 LAB — IRON,TIBC AND FERRITIN PANEL
Ferritin: 379 ng/mL — ABNORMAL HIGH (ref 15–150)
Iron Saturation: 19 % (ref 15–55)
Iron: 58 ug/dL (ref 27–159)
Total Iron Binding Capacity: 303 ug/dL (ref 250–450)
UIBC: 245 ug/dL (ref 131–425)

## 2022-10-03 LAB — CBC
Hematocrit: 41.9 % (ref 34.0–46.6)
Hemoglobin: 13.2 g/dL (ref 11.1–15.9)
MCH: 26.8 pg (ref 26.6–33.0)
MCHC: 31.5 g/dL (ref 31.5–35.7)
MCV: 85 fL (ref 79–97)
Platelets: 300 10*3/uL (ref 150–450)
RBC: 4.93 x10E6/uL (ref 3.77–5.28)
RDW: 12.8 % (ref 11.7–15.4)
WBC: 5.6 10*3/uL (ref 3.4–10.8)

## 2022-10-03 LAB — TSH+FREE T4
Free T4: 1.41 ng/dL (ref 0.82–1.77)
TSH: 0.578 u[IU]/mL (ref 0.450–4.500)

## 2022-10-03 LAB — THYROID PEROXIDASE ANTIBODY: Thyroperoxidase Ab SerPl-aCnc: 17 IU/mL (ref 0–34)

## 2022-10-21 ENCOUNTER — Inpatient Hospital Stay: Payer: BC Managed Care – PPO | Admitting: Oncology

## 2022-10-21 ENCOUNTER — Inpatient Hospital Stay: Payer: BC Managed Care – PPO | Attending: Oncology

## 2022-10-21 ENCOUNTER — Other Ambulatory Visit: Payer: Self-pay

## 2022-10-21 VITALS — BP 159/101 | HR 74 | Temp 97.9°F | Resp 17 | Ht 64.0 in | Wt 206.2 lb

## 2022-10-21 DIAGNOSIS — D509 Iron deficiency anemia, unspecified: Secondary | ICD-10-CM | POA: Diagnosis not present

## 2022-10-21 DIAGNOSIS — Z79899 Other long term (current) drug therapy: Secondary | ICD-10-CM | POA: Insufficient documentation

## 2022-10-21 LAB — IRON AND IRON BINDING CAPACITY (CC-WL,HP ONLY)
Iron: 97 ug/dL (ref 28–170)
Saturation Ratios: 30 % (ref 10.4–31.8)
TIBC: 329 ug/dL (ref 250–450)
UIBC: 232 ug/dL (ref 148–442)

## 2022-10-21 LAB — CBC WITH DIFFERENTIAL (CANCER CENTER ONLY)
Abs Immature Granulocytes: 0.01 10*3/uL (ref 0.00–0.07)
Basophils Absolute: 0 10*3/uL (ref 0.0–0.1)
Basophils Relative: 1 %
Eosinophils Absolute: 0.1 10*3/uL (ref 0.0–0.5)
Eosinophils Relative: 2 %
HCT: 39.5 % (ref 36.0–46.0)
Hemoglobin: 12.6 g/dL (ref 12.0–15.0)
Immature Granulocytes: 0 %
Lymphocytes Relative: 27 %
Lymphs Abs: 1.5 10*3/uL (ref 0.7–4.0)
MCH: 27.5 pg (ref 26.0–34.0)
MCHC: 31.9 g/dL (ref 30.0–36.0)
MCV: 86.1 fL (ref 80.0–100.0)
Monocytes Absolute: 0.3 10*3/uL (ref 0.1–1.0)
Monocytes Relative: 5 %
Neutro Abs: 3.5 10*3/uL (ref 1.7–7.7)
Neutrophils Relative %: 65 %
Platelet Count: 287 10*3/uL (ref 150–400)
RBC: 4.59 MIL/uL (ref 3.87–5.11)
RDW: 13.3 % (ref 11.5–15.5)
WBC Count: 5.4 10*3/uL (ref 4.0–10.5)
nRBC: 0 % (ref 0.0–0.2)

## 2022-10-21 LAB — FERRITIN: Ferritin: 134 ng/mL (ref 11–307)

## 2022-10-21 NOTE — Progress Notes (Signed)
Hematology and Oncology Follow Up Visit  Gwendolyn Nguyen [id number] [birth year] 37 y.o. 10/21/2022 1:41 PM Gwendolyn Nguyen, FNPMoore, Gwendolyn [place], FNP   Principle Diagnosis: 37 year old woman with iron deficiency anemia presented today with ferritin of 13, iron of 26 related to poor iron absorption in May 2023.     Prior therapy: Venofer 800 mg total dose completed on May 29, 2022.   Current therapy: Active surveillance and repeat iron infusion as needed.  Interim History: Ms. Gwendolyn Nguyen returns today for a follow-up visit.  Since the last visit, she reports no major changes in her health.  She tolerated the intravenous iron infusion without any complications.  She does report periodic arthralgias and myalgias which have resulted her in missing some work periodically.  No evidence of hematochezia or melena.  She is status post hysterectomy in 2019.     Medications: Reviewed without changes. Current Outpatient Medications  Medication Sig Dispense Refill   albuterol (VENTOLIN HFA) 108 (90 Base) MCG/ACT inhaler Inhale 2 puffs into the lungs every 6 (six) hours as needed for wheezing or shortness of breath. 18 g 1   albuterol (VENTOLIN HFA) 108 (90 Base) MCG/ACT inhaler Inhale 2 puffs into the lungs every 6 (six) hours as needed for wheezing or shortness of breath. 18 g 1   amLODipine (NORVASC) 5 MG tablet Take 1 tablet (5 mg total) by mouth daily. 30 tablet 11   cariprazine (VRAYLAR) 1.5 MG capsule Take 1 capsule (1.5 mg total) by mouth daily. 30 capsule 5   cyclobenzaprine (FLEXERIL) 5 MG tablet Take 5 mg by mouth as directed. 2 times a day per GYN     dicyclomine (BENTYL) 20 MG tablet Take 20 mg by mouth 3 (three) times daily.     DULoxetine (CYMBALTA) 30 MG capsule Take 2 capsules by mouth daily 120 capsule 1   EPINEPHrine (EPIPEN 2-PAK) 0.3 mg/0.3 mL IJ SOAJ injection Inject 0.3 mg into the muscle as needed for anaphylaxis. 1 each 1   famotidine (PEPCID) 20 MG tablet Take 1 tablet (20 mg total)  by mouth 2 (two) times daily. 60 tablet 5   fluticasone (FLOVENT HFA) 110 MCG/ACT inhaler Inhale 2 puffs twice a day with spacer to help prevent cough and wheeze 1 each 1   gabapentin (NEURONTIN) 100 MG capsule Take 1 capsule (100 mg total) by mouth daily. Take with 300 mg tab total 400 mg TID 90 capsule 1   gabapentin (NEURONTIN) 300 MG capsule Take 1 capsule (300 mg total) by mouth 3 (three) times daily. 90 capsule 2   hydrochlorothiazide (HYDRODIURIL) 25 MG tablet Take 1 tablet (25 mg total) by mouth daily. 90 tablet 1   HYDROcodone-acetaminophen (NORCO/VICODIN) 5-325 MG tablet Take 1 tablet by mouth 2 (two) times daily as needed.     ketorolac (TORADOL) 10 MG tablet Take 1 tablet (10 mg total) by mouth every 6 (six) hours as needed. 20 tablet 2   levocetirizine (XYZAL) 5 MG tablet Take 1 tablet (5 mg total) by mouth 2 (two) times daily. 60 tablet 5   omeprazole (PRILOSEC) 40 MG capsule Take 40 mg by mouth daily. As directed by GI     oxybutynin (DITROPAN) 5 MG tablet Take 5 mg by mouth every morning.     phentermine 15 MG capsule TAKE 1 CAPSULE BY MOUTH EVERY DAY IN THE MORNING 30 capsule 1   PULMICORT FLEXHALER 180 MCG/ACT inhaler Inhale 2 puffs into the lungs in the morning and at bedtime. 1 each 5   traMADol (  ULTRAM) 50 MG tablet Take 1 tablet (50 mg total) by mouth every 6 (six) hours as needed. 30 tablet 1   venlafaxine XR (EFFEXOR-XR) 75 MG 24 hr capsule Take 75 mg by mouth daily.     No current facility-administered medications for this visit.     Allergies:  Allergies  Allergen Reactions   Beef-Derived Products Hives and Itching   Gadolinium Derivatives Hives, Itching and Nausea And Vomiting    Pt vomited immediately during injection.  15 minutes later, pt developed hives and itching.    Other Hives and Itching    Lamb   Shellfish Allergy Hives   Sulfa Antibiotics Other (See Comments)    Patient was told to NOT TAKE THIS   Sulfamethoxazole-Trimethoprim Other (See Comments)     Other reaction(s): hives   Iohexol Hives and Rash    08-2018, rash and hives, needs pre meds S/W PATIENT STATED REACTION FROM CT CONTRAST 11/19     Physical Exam:  Last menstrual period 07/06/2019.   ECOG: 0    General appearance: Alert, awake without any distress. Head: Atraumatic without abnormalities Oropharynx: Without any thrush or ulcers. Eyes: No scleral icterus. Lymph nodes: No lymphadenopathy noted in the cervical, supraclavicular, or axillary nodes Heart:regular rate and rhythm, without any murmurs or gallops.   Lung: Clear to auscultation without any rhonchi, wheezes or dullness to percussion. Abdomin: Soft, nontender without any shifting dullness or ascites. Musculoskeletal: No clubbing or cyanosis. Neurological: No motor or sensory deficits. Skin: No rashes or lesions.      Lab Results: Lab Results  Component Value Date   WBC 5.6 10/02/2022   HGB 13.2 10/02/2022   HCT 41.9 10/02/2022   MCV 85 10/02/2022   PLT 300 10/02/2022     Chemistry      Component Value Date/Time   NA 141 10/02/2022 0907   K 4.8 10/02/2022 0907   CL 100 10/02/2022 0907   CO2 27 10/02/2022 0907   BUN 12 10/02/2022 0907   CREATININE 1.12 (H) 10/02/2022 0907      Component Value Date/Time   CALCIUM 10.0 10/02/2022 0907   ALKPHOS 93 10/02/2022 0907   AST 14 10/02/2022 0907   ALT 11 10/02/2022 0907   BILITOT 0.4 10/02/2022 0907       Impression and Plan:  37 year old with:  1.  Iron deficiency anemia related to poor oral iron absorption diagnosed in May 2023.   She is status post 800 mg of Venofer completed in August 2023.  Repeat laboratory testing on December 14 showed normalization of her iron and ferritin with hemoglobin up to 13.2.  CBC from today continues to show normal counts currently at 12.6 with iron studies are currently pending.   At this time, I recommended multivitamin supplement and a daily basis and no additional IV iron infusion.  2.  Ecchymosis:  Resolved at this time without any evidence of hematological disorder.  3.  Follow-up: Will be as needed in the future.  30  minutes were dedicated to this visit.  The time was spent on updating disease status, treatment choices and outlining future plan of care discussion.     Zola Button, MD 1/2/20241:41 PM

## 2022-10-28 ENCOUNTER — Ambulatory Visit: Payer: BC Managed Care – PPO | Admitting: Oncology

## 2022-10-28 ENCOUNTER — Encounter: Payer: Self-pay | Admitting: Nurse Practitioner

## 2022-10-28 ENCOUNTER — Other Ambulatory Visit: Payer: BC Managed Care – PPO

## 2022-11-03 ENCOUNTER — Other Ambulatory Visit: Payer: Self-pay | Admitting: Nurse Practitioner

## 2022-11-03 ENCOUNTER — Encounter: Payer: Self-pay | Admitting: Nurse Practitioner

## 2022-11-03 DIAGNOSIS — M2550 Pain in unspecified joint: Secondary | ICD-10-CM

## 2022-11-03 MED ORDER — VALACYCLOVIR HCL 1 G PO TABS: ORAL_TABLET | ORAL | 2 refills | Status: DC

## 2022-11-03 MED ORDER — KETOROLAC TROMETHAMINE 10 MG PO TABS: 10.0000 mg | ORAL_TABLET | Freq: Four times a day (QID) | ORAL | 2 refills | Status: DC | PRN

## 2022-11-05 ENCOUNTER — Ambulatory Visit
Admission: RE | Admit: 2022-11-05 | Discharge: 2022-11-05 | Disposition: A | Discharge status: Home / Self Care | Payer: BC Managed Care – PPO | Source: Ambulatory Visit | Attending: Nurse Practitioner | Admitting: Nurse Practitioner

## 2022-11-05 DIAGNOSIS — R6889 Other general symptoms and signs: Secondary | ICD-10-CM

## 2022-11-05 DIAGNOSIS — R221 Localized swelling, mass and lump, neck: Secondary | ICD-10-CM

## 2022-11-06 ENCOUNTER — Encounter: Payer: Self-pay | Admitting: Nurse Practitioner

## 2022-11-06 ENCOUNTER — Other Ambulatory Visit: Payer: Self-pay | Admitting: Nurse Practitioner

## 2022-11-10 ENCOUNTER — Other Ambulatory Visit: Payer: Self-pay | Admitting: Nurse Practitioner

## 2022-11-10 DIAGNOSIS — M797 Fibromyalgia: Secondary | ICD-10-CM

## 2022-11-10 NOTE — Progress Notes (Signed)
 MIGS CONSULTATION  Assessment/Plan:    Problem List Items Addressed This Visit   None Visit Diagnoses     Surgical menopause    -  Primary   Relevant Medications   estradiol (VIVELLE-DOT) 0.025 mg/24 hr (Start on 11/13/2022)   Spastic pelvic floor syndrome       Generalized joint pain            Impression:[name] is a 37 y.o. 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   She returned today for discussion of hormone managemen and follow up of ongoing non gyn pain symptoms.   She continues to have sx concerning for an autoimmune disorder. She has mild pelvic floor spasm and likely OAB.   Surgical Menopause:  - Stopped all HRT due to MSK and joint pain worsening with this, however without improvement - Will restart at low dose patch 0.025mg  and assess for SE   Post-op hysterectomy - Full ectocervix not removed on path however no cervix seen on exam. -  No concern for endo recurrence due to this, but will require ongoing pap smears- pap completed 05/2022 and was normal. R 05/2027.   Endometriosis: - Confirmed on path, negative margins on rectum - s/p definitive tx with hyst BSO  Pelvic floor spasm: - Flexeril  prn - used rarely after increased strenuous activity  MSK/Joint pain - Discussed her case with [facility] Rheum who has offered her a visit - July 2024.   Pt will f/up in person with me at 1 year for annual exam   The patient reports they are physically located in [place]  and is currently: at work, private location conducted a audio/video visit. I spent  75m 30s on the video call with the patient. I spent an additional 10 minutes on pre- and post-visit activities on the date of service .      Subjective:  . CC: follow up of surgical menopause HPI:  Gwendolyn Nguyen is a  37 y.o. 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   She has been having ongoing joint pain, rashes, low grade fevers with ongoing concern for underlying autoimmune process.  She returns today to follow up for surgical menopause and ongoing symptoms. Last visit was 05/2022.  Since last visit she has been seeing [facility] - Starting lyrica in place of gabapentin  - Toradol  courses when needed for severe pain  Pain she is having is ankles, wrists, knees, lower back, at neck, she has been having rashes, SOB, chest pains - On inhalers which has helped - Rashes better with anti-allergy  medicines  Has been off estrogen altogether because prior we weren't sure if her sx were related to an estrogen allergy , however sx have persisted since stopping.  She was taking 0.5mg  estradiol pill. She stopped patch bc estrogen level was too high. However since then she has stopped estrogen altogether bc made her feel worse in terms of brain fog and overall body pain.  She was started on norethidnrone by PCP and did not help, felt like it made her worse as well.  She has stopped that.   Starting before surgery, however worsening after she has had flares of pain that  were full body, MSK and joint, abdominal and pelvic pain.   She has had a consistently elevated ANA however without confirmatory testing of a specific autoimmune disease.  She has been diagnosed with fibromyalgia, however her significant join pain has not been explained. She has seen Rheumatology however has not gotten any answers or treatment recommendations.  She notes this joint pain improves when she has taken prednisone  taper before however she does not have a means to continue use of prednisone  as no formal dx of SLE or other, nor would  this likely be advisable given its SE profile.   Flexeril  BID which provides some relief - however not of the joint pain.    Prior history: She is followed for  history of pelvic pain in s/o recurrent bilateral TOA, multiple pelvic abscesses with SBO, in s/o presumed superinfected endometriomas with lower EUS finding of rectal endo suggesting Stage IV disease, now s/p prolonged admission with diverting colostomy in s/o recurrent infection and pain and subsequent planned definitive hysterectomy, BSO and LAR and diverting ileostomy on 04/13/20 with Dr. Ricka and myself.   Pathology confirmed endometriosis of rectum and ovaries. Underwent ostomy take down on 8/4. Pathology confirmed endometriosis.   Initial history: Pt was hospitalized 04/2019 for R adnexal cystic mass/TOA/hydrosalpinx/SBO vs reactive ileus requiring NG tube.   She ultimately underwent pelvic MRI 05/08/2019 which showed complex appearance with bilateral serpentine and cystic adnexal lesions representing pyosalpinx bilaterally as well as scattered collections with thick enhancing margins along the lower paracolic gutters, perirectal space, and above the urinary bladder suspicious for abscesses. MRI also showed ADENOMYOSIS.  She had undergone CT-guided drainage of these collections. Subsequent injection of the drains demonstrated no evidence of a fistula.  Repeat CT abdomen pelvis 05/20/2019 demonstrated improved findings with resolved abscesses. There was an indeterminate cyst/lesion in the left adnexa that was 5 cm.  DMPA was given.   She was following up with Dr. Rutherford s/p admission  07/08/19 c/o worseinign LLQ pain. WBC was 17.6, Hgb 12.3, Plst 623 at that time. Urine culture was negative. She was treated with ceftriaxone and started on PO flagyl  and doxy.  WBC decreased to 11 on 07/11/19.  Pain however worsened during that week with onset of vaginal bleeding. Exam was improved at that time though continued tenderness. Plan  was to continue DMPA. She was set up for colonoscopy 07/09/29.  She was then readmitted on 9/30 with progressed TOAs on CT and WBC 16.9. and treated with gentamycin, clinda and ampicillin  for recurrent/unresolving TOA.  She was f/b Gen surg and GYN. She underwent colonoscopy during that admission but prep was incomplete.  On 10/4 she underwent Laparosocpic Lysis of adhesoins and drainage of pelvic abscess with Dr. Dann Hummer and Dr. Dickie Rutherford. A JP drain was left in place. Notable omental adhesions were encountered and simple drainage and irrigation of the abscess was performed. Colon was noted to be running near the area.  Following this CT showed no change in the fluid collections and persistent abscess. IR was consulted for drainage.  She was discharged on 10/16 after 14 days of A/G/C. At time of dc Cr was 1.07, Na 128, Glucose 453 No antibiotics on discharge needed and JP was removed before discharge.   Following this admission, she she followed up with pulmonary complaints with SOB, found to have atelectasis on CT chest while in house. Per patient, pulmonary doc said there were no issues but she continues to have some sensation of chest pulling when  she takes a deep breath.  She was also seeing GI for bloody stools which have continued, but per her report she had no findings that could account for them.   Pt reports that prior to admission in July had long term pelvic pain. Reports since menarche has had heavy painful bleeding. She had historically taken iburofpen and able to manage it. Was using different types of birth control to control this in the past. No prior IUD. About 3-4 years ago, pain was impacting ability to work, stand up straight, having N/V associated.  At that point she started holistic approaches including acupuncture, CBD gummies, castor oil packs. During that time she was off and on hormonal methods.  Hormones sometimes made a difference but also came with some  reaction (like hives for instance), some with consistent bleeding.   Prior to recently, never had diagnostic laparoscopy for endometriosis.  Had had prior US , CT, and MRI prior to July and was never told she had endometriomas, but was told she had cysts and fluid. She reports continued monitoring. Was never told she had endometriosis.  Her pain has become daily in the past year as opposed to prior just with menstrual cycle.  Surgical history is notable for  July - had drain on R placed via IR October - laparoscopy with drainage and drain placement    Previous Testing/Records: Outside medical records were reviewed to synthesize the above history, along with the history and physical obtained during the visit.  Outside laboratory, pathology, and imaging reports were reviewed, with pertinent results below.  IMAGING:  None recent  Blood culture negative 07/20/19  Lab Results  Component Value Date   WBC 7.1 09/22/2021   HGB 12.9 09/22/2021   HCT 40.5 09/22/2021   PLT 314 09/22/2021      OB History     Gravida  0   Para  0   Term  0   Preterm  0   AB  0   Living  0      SAB  0   IAB  0   Ectopic  0   Molar  0   Multiple  0   Live Births  0       Obstetric Comments  Menarche at age: 31 Last pap :2023 Abnormal pap: none  Depo provera             History: Past Medical History:  Diagnosis Date  . Anemia   . History of transfusion   . Lower back pain   . Muscle function loss   . Ovarian cyst   . Polyarthralgia     Past Surgical History:  Procedure Laterality Date  . BOWEL OBSTRUCTION  2020  . DIAGNOSTIC LAPAROSCOPY     adhesions  . INTRODUCTION OF GI OR NG TUBE [facility] HISTORICAL RESULT)  04/2019  . PR CLOSE ENTEROSTOMY,RESEC+ANAST N/A 05/23/2020   Procedure: CLOSURE OF ENTEROSTOMY, LARGE OR SMALL INTESTINE; WITH RESECTION & ANASTOMOSIS, OTHER THAN COLORECTAL;  Surgeon: Darice Landry Fairy, MD;  Location: MAIN OR [facility];  Service: Surgical  Oncology  . PR COLSC FLX W/NDSC US  XM RCTM ET AL LMTD&ADJ STRUX N/A 02/16/2020   Procedure: COLONOSCOPY, FLEX, PROX SPLEN FLEX; DX, W/WO SPEC(S) BRUSH/WASH W/WO: DECOMPRESS W/ENDO U/S;  Surgeon: Olam Earnie Henle, MD;  [facility];  Service: Gastroenterology  . PR DRAIN ABD ABSCESS OPEN N/A 03/21/2020   Procedure: Drainage Of Peritoneal Abscess Or Localized Peritonitis, Exclusive Of Appendiceal Abscess; Open;  Surgeon: Darice  Landry Fairy, MD;  Location: MAIN OR [facility];  Service: Surgical Oncology  . PR ILEOSTOMY/JEJUNOSTOMY,NONTUBE N/A 04/13/2020   Procedure: ILEOSTOMY OR JEJUNOSTOMY, NON-TUBE;  Surgeon: Darice Landry Fairy, MD;  Location: MAIN OR [facility];  Service: Surgical Oncology  . PR LAP, SURG COLOSTOMY N/A 03/21/2020   Procedure: Laparoscopy, Surgical, Colostomy Or Skin Level Cecostomy;  Surgeon: Darice Landry Fairy, MD;  Location: MA[facility] OR [facility];  Service: Surgical Oncology  . PR LAP,DIAGNOSTIC ABDOMEN N/A 03/21/2020   Procedure: Exploratory laparoscopy with abdominal washout, possible laparoscopic excision of endometriosis, possible hysterectomy with possible removal of unilateral/bilateral fallopian tubes and ovaries, possible exploratory laparotomy, possible colectomy, possible ostomy formation    ;  Surgeon: Darice Landry Fairy, MD;  Location: MAIN OR [facility];  Service: Surgical Oncology  . PR PART REMOVAL COLON W COLOPROCTOSTOMY N/A 04/13/2020   Procedure: COLECTOMY, PARTIAL; WITH COLOPROCTOSTOMY (LOW PELVIC ANASTOMOSIS);  Surgeon: Darice Landry Fairy, MD;  Location: MAIN OR [facility];  Service: Surgical Oncology  . PR PELVIC EXAMINATION W ANESTH N/A 03/21/2020   Procedure: Pelvic Examination Under Anesthesia (Other Than Local);  Surgeon: Tinnie Marchelle Cash, MD;  Location: MAIN OR [facility];  Service: Advanced Laparoscopy  . PR SIGMOIDOSCOPY FLX DX W/COLLJ SPEC BR/WA IF PFRMD N/A 05/23/2020   Procedure: Sigmoidoscopy, Flexible; Diagnostic, With Or Without Collection Of  Specimen(S) By Brushing Or Washing;  Surgeon: Darice Landry Fairy, MD;  Location: MAIN OR [facility];  Service: Surgical Oncology  . PR TOTAL ABDOM HYSTERECTOMY N/A 04/13/2020   Procedure: TOTAL ABDOMINAL HYSTERECTOMY (CORPUS & CERVIX), W/WO REMOVAL OF TUBE(S), W/WO REMOVAL OF OVARY(S);  Surgeon: Tinnie Marchelle Cash, MD;  Location: MAIN OR [facility];  Service: Advanced Laparoscopy     Current Outpatient Medications on File Prior to Visit  Medication Sig Dispense Refill  . albuterol  HFA 90 mcg/actuation inhaler TAKE 2 PUFFS BY MOUTH EVERY 6 HOURS AS NEEDED FOR WHEEZE OR SHORTNESS OF BREATH    . cyclobenzaprine  (FLEXERIL ) 5 MG tablet Take up to two tablets every 8 hours as needed for muscle spasms. . 180 tablet 1  . diclofenac  submicronized 35 mg cap 1 capsule as needed Orally UP TO THREE TIMES A DAY AS NEEDED for 30 days    . docosanol (ABREVA) 10 % Crea as directed Externally prn    . DULoxetine  (CYMBALTA ) 30 MG capsule Take 2 capsules (60 mg total) by mouth daily.    . EPINEPHrine  (EPIPEN ) 0.3 mg/0.3 mL injection Injection for 30 Days    . gabapentin  (NEURONTIN ) 300 MG capsule Take 2 capsules (600 mg total) by mouth Three (3) times a day.    . HYDROcodone -acetaminophen  (NORCO) 5-325 mg per tablet 1 tablet Orally two times a day as needed for 30 days    . ibuprofen (MOTRIN) 600 MG tablet take 1 tablet by mouth every 6 hours if needed for pain    . ketorolac  (TORADOL ) 10 mg tablet Take 1 tablet (10 mg total) by mouth.    SABRA omeprazole (PRILOSEC) 40 MG capsule 1 capsule 30 minutes before morning meal    . oxybutynin (DITROPAN) 5 MG tablet TAKE 1 TABLET (5 MG TOTAL) BY MOUTH IN THE MORNING 90 tablet 1  . pregabalin (LYRICA) 75 MG capsule 1 capsule (75 mg total).    . PULMICORT  FLEXHALER 180 mcg/actuation inhaler INHALE 2 PUFFS INTO THE LUNGS IN THE MORNING AND AT BEDTIME.    . traMADol  (ULTRAM ) 50 mg tablet Take 1 tablet (50 mg total) by mouth every six (6) hours.    . valACYclovir  (VALTREX )  1000 MG tablet  Take 1 tablet twice a day x 3 days for cold sores outbreaks     No current facility-administered medications on file prior to visit.   Allergies  Allergen Reactions  . Beef Containing Products Hives  . Gadolinium-Containing Contrast Media Hives, Itching and Nausea And Vomiting    Pt vomited immediately during injection.  15 minutes later, pt developed hives and itching.   . Iodine Hives  . Iohexol  Hives and Rash    7/21 patient received benadryl  IV 50 mg one hour prior to contrast without any additional reaction, tolerated contrast well  08-2018, rash and hives, needs pre meds S/W PATIENT STATED REACTION FROM CT CONTRAST 11/19  . Shellfish Derived Hives  . Sulfa (Sulfonamide Antibiotics) Hives  . Sulfamethoxazole-Trimethoprim Other (See Comments) and Hives    Other Reaction(s): Unknown  . House Dust Mite     Other Reaction(s): Unknown  . Other Other (See Comments)    Patient was told to NOT TAKE THIS  Other Reaction(s): Unknown   Family History  Problem Relation Age of Onset  . Diabetes Maternal Grandmother   . Heart attack Maternal Grandfather   . Heart attack Paternal Grandfather     Social History   Socioeconomic History  . Marital status: Single    Spouse name: None  . Number of children: None  . Years of education: None  . Highest education level: None  Tobacco Use  . Smoking status: Never  . Smokeless tobacco: Never  Vaping Use  . Vaping Use: Former  Substance and Sexual Activity  . Alcohol use: Yes  . Drug use: Never  . Sexual activity: Not Currently    Birth control/protection: Injection   Social Determinants of Health   Financial Resource Strain: Low Risk  (04/25/2020)   Overall Financial Resource Strain (CARDIA)   . Difficulty of Paying Living Expenses: Not hard at all  Recent Concern: Financial Resource Strain - Medium Risk (04/16/2020)   Overall Financial Resource Strain (CARDIA)   . Difficulty of Paying Living Expenses: Somewhat hard  Food  Insecurity: No Food Insecurity (04/25/2020)   Hunger Vital Sign   . Worried About Programme researcher, broadcasting/film/video in the Last Year: Never true   . Ran Out of Food in the Last Year: Never true  Transportation Needs: No Transportation Needs (04/25/2020)   PRAPARE - Transportation   . Lack of Transportation (Medical): No   . Lack of Transportation (Non-Medical): No    The following portions of the patient's history were reviewed and updated as appropriate: allergies, current medications, past family history, past medical history, past social history, past surgical history and problem list.   Objective:      Constitutional: Well-developed, well-nourished female in no acute distress Neurological: Alert and oriented to person, place, and time Psychiatric: Mood and affect appropriate  GU: No discrete cervix appreciated on speculum exam. Pap of vaginal cuff completed. Pelvic floor minimally tender

## 2022-11-24 DIAGNOSIS — M25579 Pain in unspecified ankle and joints of unspecified foot: Secondary | ICD-10-CM | POA: Insufficient documentation

## 2022-11-25 ENCOUNTER — Ambulatory Visit: Payer: BC Managed Care – PPO | Admitting: Nurse Practitioner

## 2022-11-25 ENCOUNTER — Encounter: Payer: Self-pay | Admitting: Nurse Practitioner

## 2022-11-25 ENCOUNTER — Other Ambulatory Visit: Payer: Self-pay | Admitting: Nurse Practitioner

## 2022-11-25 VITALS — BP 138/98 | HR 77 | Temp 98.3°F | Ht 64.0 in | Wt 211.0 lb

## 2022-11-25 DIAGNOSIS — F331 Major depressive disorder, recurrent, moderate: Secondary | ICD-10-CM | POA: Diagnosis not present

## 2022-11-25 DIAGNOSIS — I159 Secondary hypertension, unspecified: Secondary | ICD-10-CM

## 2022-11-25 DIAGNOSIS — M797 Fibromyalgia: Secondary | ICD-10-CM

## 2022-11-25 MED ORDER — CARIPRAZINE HCL 1.5 MG PO CAPS: 1.5000 mg | ORAL_CAPSULE | Freq: Every day | ORAL | 3 refills | Status: DC

## 2022-11-25 NOTE — Patient Instructions (Signed)
This is the new Rheumatology specialist that works with [facility] [phone]

## 2022-11-25 NOTE — Progress Notes (Signed)
I,Calahan Pak,acting as a Education administrator for Minette Brine, FNP.,have documented all relevant documentation on the behalf of Minette Brine, FNP,as directed by  Minette Brine, FNP while in the presence of Minette Brine, [place].    Subjective:     Patient ID: Gwendolyn Nguyen , female    DOB: Mar 03, 1986 , 37 y.o.   MRN: [id number]   Chief Complaint  Patient presents with   Follow-up    HPI  Patient presents today for follow up.  She was going to the Pain provider at [place] [facility] - she has a f/u in one month for medication management. She is to see Rheumatology to review her last labs - with Dr. Trudie Reed.   She is seeing Dr. Nelva Bush who has her on 2 inhalers and 2 oral medications which have been helpful.   Gastroenterology - she is having a colonoscopy scheduled next week. She also f/u with spine next week.   She had been having urinary incontinence and was prescribed oxybutynin by her GYN.     Past Medical History:  Diagnosis Date   Anemia    Asthma    Back pain    Constipation    Endometriosis    IBS (irritable bowel syndrome)    Low hemoglobin    Multiple food allergies    PID (acute pelvic inflammatory disease) [id number]   complicated by tuboovarian abscess   TOA (tubo-ovarian abscess) 07/24/2019     Family History  Problem Relation Age of Onset   Hyperlipidemia Mother    Hypertension Mother    Obesity Mother    Hyperlipidemia Father    Hypertension Father    Obesity Father    Diabetes Maternal Grandmother    Heart disease Maternal Grandfather    Colon cancer Neg Hx    Esophageal cancer Neg Hx    Inflammatory bowel disease Neg Hx    Liver disease Neg Hx    Pancreatic cancer Neg Hx    Rectal cancer Neg Hx    Stomach cancer Neg Hx      Current Outpatient Medications:    albuterol (VENTOLIN HFA) 108 (90 Base) MCG/ACT inhaler, Inhale 2 puffs into the lungs every 6 (six) hours as needed for wheezing or shortness of breath., Disp: 18 g, Rfl: 1   amLODipine (NORVASC) 5 MG  tablet, Take 1 tablet (5 mg total) by mouth daily., Disp: 30 tablet, Rfl: 11   cariprazine (VRAYLAR) 1.5 MG capsule, Take 1 capsule (1.5 mg total) by mouth daily., Disp: 30 capsule, Rfl: 3   cyclobenzaprine (FLEXERIL) 5 MG tablet, Take 5 mg by mouth as directed. 2 times a day per GYN, Disp: , Rfl:    dicyclomine (BENTYL) 20 MG tablet, Take 20 mg by mouth 3 (three) times daily., Disp: , Rfl:    DULoxetine (CYMBALTA) 30 MG capsule, Take 2 capsules by mouth daily, Disp: 120 capsule, Rfl: 1   EPINEPHrine (EPIPEN 2-PAK) 0.3 mg/0.3 mL IJ SOAJ injection, Inject 0.3 mg into the muscle as needed for anaphylaxis., Disp: 1 each, Rfl: 1   estradiol (VIVELLE-DOT) 0.025 MG/24HR, Place 1 patch onto the skin 2 (two) times a week., Disp: , Rfl:    fluticasone (FLOVENT HFA) 110 MCG/ACT inhaler, Inhale 2 puffs twice a day with spacer to help prevent cough and wheeze, Disp: 1 each, Rfl: 1   gabapentin (NEURONTIN) 300 MG capsule, Take 1 capsule (300 mg total) by mouth 3 (three) times daily., Disp: 90 capsule, Rfl: 2   HYDROcodone-acetaminophen (NORCO/VICODIN) 5-325 MG tablet,  Take 1 tablet by mouth 2 (two) times daily as needed., Disp: , Rfl:    ketorolac (TORADOL) 10 MG tablet, Take 1 tablet (10 mg total) by mouth every 6 (six) hours as needed., Disp: 20 tablet, Rfl: 2   levocetirizine (XYZAL) 5 MG tablet, Take 1 tablet (5 mg total) by mouth 2 (two) times daily., Disp: 60 tablet, Rfl: 5   omeprazole (PRILOSEC) 40 MG capsule, Take 40 mg by mouth daily. As directed by GI, Disp: , Rfl:    oxybutynin (DITROPAN) 5 MG tablet, Take 5 mg by mouth every morning., Disp: , Rfl:    pregabalin (LYRICA) 75 MG capsule, Take 75 mg by mouth daily., Disp: , Rfl:    traMADol (ULTRAM) 50 MG tablet, Take 1 tablet (50 mg total) by mouth every 6 (six) hours as needed., Disp: 30 tablet, Rfl: 1   valACYclovir (VALTREX) 1000 MG tablet, Take 1 tablet twice a day x 3 days for cold sores outbreaks, Disp: 30 tablet, Rfl: 2   famotidine (PEPCID) 20  MG tablet, Take 1 tablet (20 mg total) by mouth 2 (two) times daily. (Patient not taking: Reported on 11/25/2022), Disp: 60 tablet, Rfl: 5   hydrochlorothiazide (HYDRODIURIL) 25 MG tablet, Take 1 tablet (25 mg total) by mouth daily. (Patient not taking: Reported on 11/25/2022), Disp: 90 tablet, Rfl: 1   Allergies  Allergen Reactions   Beef-Derived Products Hives and Itching   Gadolinium Derivatives Hives, Itching and Nausea And Vomiting    Pt vomited immediately during injection.  15 minutes later, pt developed hives and itching.    Other Hives and Itching    Lamb   Dust Mite Extract     Other Reaction(s): Unknown   Shellfish Allergy Hives   Sulfa Antibiotics Other (See Comments)    Patient was told to NOT TAKE THIS   Sulfamethoxazole-Trimethoprim Other (See Comments)    Other reaction(s): hives   Iohexol Hives and Rash    08-2018, rash and hives, needs pre meds S/W PATIENT STATED REACTION FROM CT CONTRAST 11/19     Review of Systems  Constitutional: Negative.   HENT: Negative.    Eyes: Negative.   Respiratory: Negative.    Cardiovascular: Negative.   Endocrine: Negative.   Genitourinary: Negative.   Allergic/Immunologic: Negative.   Neurological: Negative.   Hematological: Negative.   Psychiatric/Behavioral: Negative.       Today's Vitals   11/25/22 1412  BP: (!) 138/98  Pulse: 77  Temp: 98.3 F (36.8 C)  TempSrc: Oral  SpO2: 96%  Weight: 211 lb (95.7 kg)  Height: 5' 4"$  (1.626 m)   Body mass index is 36.22 kg/m.   Objective:  Physical Exam Vitals reviewed.  Constitutional:      General: She is not in acute distress.    Appearance: Normal appearance.  Cardiovascular:     Rate and Rhythm: Normal rate and regular rhythm.     Pulses: Normal pulses.     Heart sounds: Normal heart sounds. No murmur heard. Pulmonary:     Effort: Pulmonary effort is normal. No respiratory distress.     Breath sounds: Normal breath sounds. No wheezing.  Skin:    General: Skin is  warm and dry.  Neurological:     General: No focal deficit present.     Mental Status: She is alert and oriented to person, place, and time.     Cranial Nerves: No cranial nerve deficit.     Motor: No weakness.  Psychiatric:  Mood and Affect: Mood normal.        Behavior: Behavior normal.        Thought Content: Thought content normal.        Judgment: Judgment normal.         Assessment And Plan:     1. Secondary hypertension Comments: Blood pressure slightly elevated.  She is advised to focus on modifying her lifestyle.  Also encouraged to stay well hydrated with water.  2. Fibromyalgia Comments: We are awaiting a referral to another rheumatologist.  She is asking for [facility] for intermittent needs and doctor visits.  3. Moderate episode of recurrent major depressive disorder (HCC) Comments: To continue the Cymbalta and the Vraylar as an adjunct medication. - cariprazine (VRAYLAR) 1.5 MG capsule; Take 1 capsule (1.5 mg total) by mouth daily.  Dispense: 30 capsule; Refill: 3     Patient was given opportunity to ask questions. Patient verbalized understanding of the plan and was able to repeat key elements of the plan. All questions were answered to their satisfaction.  Minette Brine, FNP   I, Minette Brine, FNP, have reviewed all documentation for this visit. The documentation on 11/25/22 for the exam, diagnosis, procedures, and orders are all accurate and complete.   IF YOU HAVE BEEN REFERRED TO A SPECIALIST, IT MAY TAKE 1-2 WEEKS TO SCHEDULE/PROCESS THE REFERRAL. IF YOU HAVE NOT HEARD FROM US/SPECIALIST IN TWO WEEKS, PLEASE GIVE [place] A CALL AT [phone].   THE PATIENT IS ENCOURAGED TO PRACTICE SOCIAL DISTANCING DUE TO THE COVID-19 PANDEMIC.

## 2022-12-08 ENCOUNTER — Other Ambulatory Visit: Payer: Self-pay | Admitting: Allergy

## 2022-12-09 NOTE — Telephone Encounter (Signed)
 Patient report RA pain ongoing for the past year and recently worsened .   Phone at [phone]  Please advise

## 2023-01-04 ENCOUNTER — Other Ambulatory Visit: Payer: Self-pay | Admitting: Family

## 2023-01-05 NOTE — Telephone Encounter (Signed)
Generic Flovent is no longer covered by patient;s insurance. The only other option on this tier of medications that is covered is Pulmicort Flexhaler. Please advise dosage and instructions for this medication.

## 2023-01-05 NOTE — Telephone Encounter (Signed)
Ok to change to [employer] flexhaler 180 mcg 2 puffs twice a day without a spacer. Rinse mouth out after. If she has not used this inhaler before she can come by the office for a demonstration or she can ask the pharmacist to demonstrate.

## 2023-01-13 NOTE — Telephone Encounter (Signed)
 Patient calling requesting to speak with a manager/supervisor. She was scheduled for an appointment today and was a minute over the grace period and was not able to be seen. She states that she took off work for this appointment and has waiting months for this appointment. She said that she went to the wrong floor to check in after she waited in line, then she came to the correct floor and waited in line to check in. She was checked in and then was called back but the front desk staff said she was almost finished checking her in and the CMA or nurse said that she is a minute after the grace period and can't be seen. She was at the front desk prior to 8:36am. She said that one of the front desk staff was on their personal phones and not checking patient's in.    Call transferred to practice coordinator

## 2023-04-09 ENCOUNTER — Other Ambulatory Visit: Payer: Self-pay | Admitting: Allergy

## 2023-04-10 NOTE — Progress Notes (Signed)
 Rheumatology Clinic Initial Visit Note   Reason for consult: Gwendolyn Nguyen is seen in consultation at the request of Dr. Zoe Alecia Stallings for evaluation of +ANA.  Assessment and Plan: Gwendolyn Nguyen is a 37 y.o.  female with a PMH of PID, endometriosis, SBO, chronic urticaria, angioedema, food reactions who presents today for evaluation of a +ANA. This will be a 3rd opinion.  Prior Labs +ANA 1:80 (or 1:40?) in past, -ANA 09/2022 -RF, -CCP, -ENA, -dsDNA No hypocomplementemia ESR 25, CRP wnl Cr mildly elevated ~1 (GFR ~80), longstanding and stable LFTs wnl No persistent cytopenias  ANA 1:80 (or 1:40?) w/ neg ANA on recheck: No evidence of an ANA associated rheumatic disease by H&P or review of chart today. No further rheumatology work up indicated.  Fibromyalgia  Chronic Fatigue Syndrome  Allergic disease (hives, angioedema, asthma - ?MCAS)  Chronic GI symptoms After Small Bowel Resection: No evidence of an inflammatory arthritis. Took HCQ for a few months but stopped this more recently at the request of her pain physician, felt it helped her itching. Discussed that HCQ can help chronic urticaria but do not see a rheumatologic indication to continue this. With regard to her other constellation of symptoms, it is clear she has an allergic component to her symptoms and is currently off antihistamines, asked her to follow back up with allergist. It is unclear if her GI symptoms are related to allergic disease (?MCAS) vs. post-procedural vs. IBS. No clear IBD by history today. She was hoping for a unifying disease to explain her constellation of symptoms but we discussed that I do not see a clear unifying disease and it is more likely she has a few disease processes that have caused her significant symptoms. I do not see any need for further rheumatologic testing.  No follow-ups on file.   Gwendolyn Holt, MD Assistant Professor of Medicine Department of Medicine/Division of  Rheumatology [facility]  HPI: Gwendolyn Nguyen is a 37 y.o.  female with a PMH of PID, endometriosis, SBO, chronic urticaria, angioedema, food reactions who presents today for evaluation of a +ANA. This will be a 3rd opinion.  Says she was sent to a rheumatologist because her PCP was concerned for fibromyalgia but was told its not that but she doesn't feel like she's had a clear issue.  Patient reports that she has always struggled with her cycle. Always had painful periods and ovarian cysts at one point in time and being monitored. Then she started having more significant stomach pains with nausea and blood in her stool. Was referred to a GI physician and told it was her endometriosis. Then hospitalized that night for a bowel obstruction. Had NG tube and it was bad. Then had back to back hospitalizations and was told she had chronic infections. Recalls many antibiotics back to back. Then ultimately had a hysterectomy and bowel resection (had an ostomy for a period).  Then after her surgery, she continued to have issues. Initially told that it was just the recovery period. Then a year went by and she continued to have issues and this has led to her going back to the hospital. These symptoms included multiple dental carries, dry mouth, eye dryness, dryness of hands and skin, rash (only on the dorsum of her L wrist), hair falling out. Was SOB as well, was told that it was related to the nausea/vomiting. Then saw an allergist and was told she has asthma. Also notes temperature dysregulation and was initially was told  menopausal and this was the cause. Notes that she has diffuse joint and muscle pain.  Ketorolac  was very helpful. Prednisone  was very helpful. Asthma pumps has also helped. Was told these weren't long term medications so has been put on hydromorphone . This is when pain management.  Feels like she's fighting the flu all the time.  Fibromyalgia diagnosis explained some of her  symptoms but she noted that it did not explain the entirety.  Saw a cardiologist and feels like she has seen every doctor and nobody has figured it out. She has not seen a neurologist and feels like this is the only one she hasn't seen.  ROS: 14 systems were reviewed and are negative except for that mentioned in the HPI.  Allergies: Beef containing products, Gadolinium-containing contrast media, Iodine, Iohexol , Shellfish derived, Sulfa (sulfonamide antibiotics), Sulfamethoxazole-trimethoprim, House dust mite, Mite extract, and Other Immunizations: There is no immunization history for the selected administration types on file for this patient.  PMHx:  Past Medical History:  Diagnosis Date  . Anemia   . History of transfusion   . Lower back pain   . Muscle function loss   . Ovarian cyst   . Polyarthralgia    PSHx:  Past Surgical History:  Procedure Laterality Date  . BOWEL OBSTRUCTION  2020  . DIAGNOSTIC LAPAROSCOPY     adhesions  . INTRODUCTION OF GI OR NG TUBE [facility] HISTORICAL RESULT)  04/2019  . PR CLOSE ENTEROSTOMY,RESEC+ANAST N/A 05/23/2020   Procedure: CLOSURE OF ENTEROSTOMY, LARGE OR SMALL INTESTINE; WITH RESECTION & ANASTOMOSIS, OTHER THAN COLORECTAL;  Surgeon: Darice Landry Fairy, MD;  Location: MAIN OR [facility];  Service: Surgical Oncology  . PR COLSC FLX W/NDSC US  XM RCTM ET AL LMTD&ADJ STRUX N/A 02/16/2020   Procedure: COLONOSCOPY, FLEX, PROX SPLEN FLEX; DX, W/WO SPEC(S) BRUSH/WASH W/WO: DECOMPRESS W/ENDO U/S;  Surgeon: Olam Earnie Henle, MD;  Location: GI [facility];  Service: Gastroenterology  . PR DRAIN ABD ABSCESS OPEN N/A 03/21/2020   Procedure: Drainage Of Peritoneal Abscess Or Localized Peritonitis, Exclusive Of Appendiceal Abscess; Open;  Surgeon: Darice Landry Fairy, MD;  Location: MAIN OR [facility];  Service: Surgical Oncology  . PR ILEOSTOMY/JEJUNOSTOMY,NONTUBE N/A 04/13/2020   Procedure: ILEOSTOMY OR JEJUNOSTOMY, NON-TUBE;  Surgeon: Darice Landry Fairy, MD;  Location: MAIN OR [facility];  Service: Surgical Oncology  . PR LAP, SURG COLOSTOMY N/A 03/21/2020   Procedure: Laparoscopy, Surgical, Colostomy Or Skin Level Cecostomy;  Surgeon: Darice Landry Fairy, MD;  Location: MAIN OR [facility];  Service: Surgical Oncology  . PR LAP,DIAGNOSTIC ABDOMEN N/A 03/21/2020   Procedure: Exploratory laparoscopy with abdominal washout, possible laparoscopic excision of endometriosis, possible hysterectomy with possible removal of unilateral/bilateral fallopian tubes and ovaries, possible exploratory laparotomy, possible colectomy, possible ostomy formation    ;  Surgeon: Darice Landry Fairy, MD;  Location: MAIN OR [facility];  Service: Surgical Oncology  . PR PART REMOVAL COLON W COLOPROCTOSTOMY N/A 04/13/2020   Procedure: COLECTOMY, PARTIAL; WITH COLOPROCTOSTOMY (LOW PELVIC ANASTOMOSIS);  Surgeon: Darice Landry Fairy, MD;  Location: MAIN OR [facility];  Service: Surgical Oncology  . PR PELVIC EXAMINATION W ANESTH N/A 03/21/2020   Procedure: Pelvic Examination Under Anesthesia (Other Than Local);  Surgeon: Tinnie Marchelle Cash, MD;  Location: MAIN OR [facility];  Service: Advanced Laparoscopy  . PR SIGMOIDOSCOPY FLX DX W/COLLJ SPEC BR/WA IF PFRMD N/A 05/23/2020   Procedure: Sigmoidoscopy, Flexible; Diagnostic, With Or Without Collection Of Specimen(S) By Brushing Or Washing;  Surgeon: Darice Landry Fairy, MD;  Location: MAIN OR [facility];  Service:  Surgical Oncology  . PR TOTAL ABDOM HYSTERECTOMY N/A 04/13/2020   Procedure: TOTAL ABDOMINAL HYSTERECTOMY (CORPUS & CERVIX), W/WO REMOVAL OF TUBE(S), W/WO REMOVAL OF OVARY(S);  Surgeon: Tinnie Marchelle Cash, MD;  Location: MAIN OR [facility];  Service: Advanced Laparoscopy   Family Hx:  Family History  Problem Relation Age of Onset  . Diabetes Maternal Grandmother   . Heart attack Maternal Grandfather   . Heart attack Paternal Grandfather    Social Hx:  Social History   Socioeconomic History  . Marital status: Single  Tobacco Use  .  Smoking status: Never  . Smokeless tobacco: Never  Vaping Use  . Vaping status: Former  Substance and Sexual Activity  . Alcohol use: Yes  . Drug use: Never  . Sexual activity: Not Currently    Birth control/protection: Injection   Social Determinants of Health   Financial Resource Strain: Low Risk  (04/25/2020)   Overall Financial Resource Strain (CARDIA)   . Difficulty of Paying Living Expenses: Not hard at all  Recent Concern: Financial Resource Strain - Medium Risk (04/16/2020)   Overall Financial Resource Strain (CARDIA)   . Difficulty of Paying Living Expenses: Somewhat hard  Food Insecurity: No Food Insecurity (04/25/2020)   Hunger Vital Sign   . Worried About Programme researcher, broadcasting/film/video in the Last Year: Never true   . Ran Out of Food in the Last Year: Never true  Transportation Needs: No Transportation Needs (04/25/2020)   PRAPARE - Transportation   . Lack of Transportation (Medical): No   . Lack of Transportation (Non-Medical): No    Medications:  Current Outpatient Medications  Medication Sig Dispense Refill  . amlodipine  (NORVASC ) 2.5 MG tablet Take 1 tablet (2.5 mg total) by mouth daily.    . cariprazine  (VRAYLAR ) 1.5 mg capsule Take 1 capsule (1.5 mg total) by mouth.    . fluconazole  (DIFLUCAN ) 100 MG tablet Take 1 tablet (100 mg total) by mouth daily.    SABRA FLUoxetine (PROZAC) 20 MG tablet Take 1 tablet (20 mg total) by mouth daily.    . HYDROmorphone  (DILAUDID ) 2 MG tablet 1 tablet (2 mg total).    . albuterol  HFA 90 mcg/actuation inhaler TAKE 2 PUFFS BY MOUTH EVERY 6 HOURS AS NEEDED FOR WHEEZE OR SHORTNESS OF BREATH    . cyclobenzaprine  (FLEXERIL ) 5 MG tablet Take up to two tablets every 8 hours as needed for muscle spasms. . 180 tablet 1  . diclofenac  submicronized 35 mg cap 1 capsule as needed Orally UP TO THREE TIMES A DAY AS NEEDED for 30 days (Patient not taking: Reported on 04/10/2023)    . docosanol (ABREVA) 10 % Crea as directed Externally prn (Patient not taking:  Reported on 04/10/2023)    . DULoxetine  (CYMBALTA ) 30 MG capsule Take 2 capsules (60 mg total) by mouth daily. (Patient not taking: Reported on 04/10/2023)    . EPINEPHrine  (EPIPEN ) 0.3 mg/0.3 mL injection Injection for 30 Days    . estradiol (VIVELLE-DOT) 0.025 mg/24 hr Place 1 patch on the skin Two (2) times a week. 8 patch 6  . gabapentin  (NEURONTIN ) 300 MG capsule Take 2 capsules (600 mg total) by mouth Three (3) times a day.    . HYDROcodone -acetaminophen  (NORCO) 5-325 mg per tablet 1 tablet Orally two times a day as needed for 30 days (Patient not taking: Reported on 04/10/2023)    . ibuprofen (MOTRIN) 600 MG tablet take 1 tablet by mouth every 6 hours if needed for pain (Patient not taking: Reported on  04/10/2023)    . ketorolac  (TORADOL ) 10 mg tablet Take 1 tablet (10 mg total) by mouth.    SABRA omeprazole (PRILOSEC) 40 MG capsule 1 capsule 30 minutes before morning meal    . oxybutynin (DITROPAN) 5 MG tablet TAKE 1 TABLET (5 MG TOTAL) BY MOUTH IN THE MORNING 90 tablet 1  . pregabalin (LYRICA) 75 MG capsule 1 capsule (75 mg total). (Patient not taking: Reported on 04/10/2023)    . PULMICORT  FLEXHALER 180 mcg/actuation inhaler INHALE 2 PUFFS INTO THE LUNGS IN THE MORNING AND AT BEDTIME.    . traMADol  (ULTRAM ) 50 mg tablet Take 1 tablet (50 mg total) by mouth every six (6) hours.    . valACYclovir  (VALTREX ) 1000 MG tablet Take 1 tablet twice a day x 3 days for cold sores outbreaks (Patient not taking: Reported on 04/10/2023)     No current facility-administered medications for this visit.    Physical Exam:  Vitals:   04/10/23 1433  BP: 115/85  Pulse: 103  Temp: 36.2 C (97.1 F)  TempSrc: Temporal  Weight: 98.5 kg (217 lb 3.2 oz)  Height: 162.6 cm (5' 4)   GEN:  In no acute distress.  Well nourished and well kempt. Pleasant. HEENT: No scarring alopecia. [place]/AT. EOMI, sclera white. Clear orpharynx, no nasal ulcers. Neck:  Supple, no JVD at 90 degrees. No carotid bruit.  Lymph: No cervical  or supraclavicular lymphadenopathy. Chest: Good respiratory effort with symmetrical chest expansion.  Clear. Cardiovascular: Radial pulses 2+ and symmetric, regular. MSK:  Multiple tender points.  Hands: No swelling/synovitis. Full ROM. Able to make fist.  Wrists:  No swelling/synovitis. Full ROM.  Elbows: No swelling/synovitis. Full ROM.  Shoulders: Full ROM.  Neck: Non-tender to mid-line palpation. Full ROM.  Back: Non-tender to mid-line palpation & SI joints. No deformities. Full ROM.  Hips: Full ROM.  Knees: No swelling/synovitis. Full ROM.  Ankles: No swelling/synovitis. Full ROM.  Feet: No swelling/synovitis. Full ROM. Neuro: AAOx3. No focal neurologic deficits. Strength 5/5 bilaterally in upper and lower extremities. Skin: No rashes, nodules or dry skin noted.  No petichiae or purpura.  No cyanosis or clubbing. Psych: Normal mood and affect. Normal thought process.  I personally spent 67 minutes face-to-face and non-face-to-face in the care of this patient, which includes all pre, intra, and post visit time on the date of service.  All documented time was specific to the E/M visit and does not include any procedures that may have been performed.

## 2023-05-11 ENCOUNTER — Other Ambulatory Visit: Payer: Self-pay | Admitting: Allergy

## 2023-07-05 ENCOUNTER — Other Ambulatory Visit: Payer: Self-pay | Admitting: Nurse Practitioner

## 2023-07-05 DIAGNOSIS — I158 Other secondary hypertension: Secondary | ICD-10-CM

## 2023-10-19 ENCOUNTER — Encounter: Payer: Self-pay | Admitting: Oncology

## 2023-11-19 ENCOUNTER — Other Ambulatory Visit: Payer: Self-pay | Admitting: Geriatric Medicine

## 2023-11-19 DIAGNOSIS — K5090 Crohn's disease, unspecified, without complications: Secondary | ICD-10-CM

## 2023-11-27 ENCOUNTER — Encounter: Payer: Self-pay | Admitting: Oncology

## 2023-12-04 ENCOUNTER — Telehealth: Payer: Self-pay

## 2023-12-04 MED ORDER — DIPHENHYDRAMINE HCL 50 MG PO TABS: ORAL_TABLET | ORAL | 0 refills | Status: AC

## 2023-12-04 MED ORDER — PREDNISONE 50 MG PO TABS: ORAL_TABLET | ORAL | 0 refills | Status: DC

## 2023-12-04 NOTE — Progress Notes (Signed)
Spoke with Gwendolyn Nguyen regarding her 13hr prep for her CT scheduled on 12/07/23.  She requested to also have benadryl called in as well. She received instructions on the times to take her medication and she verbalized understanding.

## 2023-12-07 ENCOUNTER — Ambulatory Visit
Admission: RE | Admit: 2023-12-07 | Discharge: 2023-12-07 | Disposition: A | Discharge status: Home / Self Care | Payer: 59 | Source: Ambulatory Visit | Attending: Geriatric Medicine | Admitting: Geriatric Medicine

## 2023-12-07 ENCOUNTER — Encounter: Payer: Self-pay | Admitting: Oncology

## 2023-12-07 DIAGNOSIS — K5090 Crohn's disease, unspecified, without complications: Secondary | ICD-10-CM

## 2023-12-07 MED ORDER — DIPHENHYDRAMINE HCL 50 MG PO TABS: ORAL_TABLET | ORAL | 0 refills | Status: DC

## 2023-12-07 MED ORDER — PREDNISONE 50 MG PO TABS: ORAL_TABLET | ORAL | 0 refills | Status: DC

## 2023-12-14 ENCOUNTER — Ambulatory Visit
Admission: RE | Admit: 2023-12-14 | Discharge: 2023-12-14 | Disposition: A | Discharge status: Home / Self Care | Payer: 59 | Source: Ambulatory Visit | Attending: Geriatric Medicine | Admitting: Geriatric Medicine

## 2023-12-14 MED ORDER — IOPAMIDOL (ISOVUE-300) INJECTION 61%
100.0000 mL | Freq: Once | INTRAVENOUS | Status: AC | PRN
  Administered 2023-12-14: 100 mL via INTRAVENOUS

## 2024-01-22 ENCOUNTER — Encounter: Payer: Self-pay | Admitting: Allergy

## 2024-01-22 ENCOUNTER — Encounter: Payer: Self-pay | Admitting: Oncology

## 2024-01-22 ENCOUNTER — Ambulatory Visit (INDEPENDENT_AMBULATORY_CARE_PROVIDER_SITE_OTHER): Payer: Self-pay | Admitting: Allergy

## 2024-01-22 ENCOUNTER — Other Ambulatory Visit: Payer: Self-pay

## 2024-01-22 VITALS — BP 112/78 | HR 81 | Temp 97.8°F | Resp 18 | Ht 64.17 in | Wt 224.2 lb

## 2024-01-22 DIAGNOSIS — Z888 Allergy status to other drugs, medicaments and biological substances status: Secondary | ICD-10-CM

## 2024-01-22 DIAGNOSIS — L509 Urticaria, unspecified: Secondary | ICD-10-CM

## 2024-01-22 DIAGNOSIS — J3089 Other allergic rhinitis: Secondary | ICD-10-CM | POA: Diagnosis not present

## 2024-01-22 DIAGNOSIS — T781XXD Other adverse food reactions, not elsewhere classified, subsequent encounter: Secondary | ICD-10-CM | POA: Diagnosis not present

## 2024-01-22 DIAGNOSIS — T50905D Adverse effect of unspecified drugs, medicaments and biological substances, subsequent encounter: Secondary | ICD-10-CM

## 2024-01-22 DIAGNOSIS — J4540 Moderate persistent asthma, uncomplicated: Secondary | ICD-10-CM

## 2024-01-22 MED ORDER — FAMOTIDINE 20 MG PO TABS: 20.0000 mg | ORAL_TABLET | Freq: Two times a day (BID) | ORAL | 5 refills | Status: AC

## 2024-01-22 MED ORDER — MONTELUKAST SODIUM 10 MG PO TABS: 10.0000 mg | ORAL_TABLET | Freq: Every day | ORAL | 5 refills | Status: AC

## 2024-01-22 MED ORDER — BUDESONIDE-FORMOTEROL FUMARATE 160-4.5 MCG/ACT IN AERO: 2.0000 | INHALATION_SPRAY | Freq: Two times a day (BID) | RESPIRATORY_TRACT | 5 refills | Status: AC

## 2024-01-22 MED ORDER — EPINEPHRINE 0.3 MG/0.3ML IJ SOAJ
0.3000 mg | INTRAMUSCULAR | 1 refills | Status: AC | PRN
Start: 2024-01-22 — End: ?

## 2024-01-22 MED ORDER — NEFFY 2 MG/0.1ML NA SOLN: 1.0000 | NASAL | 1 refills | Status: AC | PRN

## 2024-01-22 NOTE — Patient Instructions (Addendum)
 Chronic hives and swelling - Etiology of hives is most likely spontaneous in nature as she continues to have episodes of hives despite food avoidance below.  Hives can be caused by a variety of different triggers including illness/infection, foods, medications, stings, exercise, pressure, vibrations, extremes of temperature to name a few however majority of the time there is no identifiable trigger.   - Will update your testing with alpha gal (red meat allergy), tryptase, environmental allergy panel and chronic hive index (autoimmune hives).  - For hive control recommend take the following: Your allergy relief tab 1 tab twice a day Pepcid 20mg  1 tab twice a day Singulair 10mg  1 tab daily at bedtime - if above high-dose antihistamine regimen is not effective enough in control hive symptoms then would recommend proceeding with Xolair monthly injections.  This was briefly discussed today.   Adverse food reaction - Avoid shellfish. In case of an allergic reaction, give Benadryl 4 teaspoonfuls every 4 hours, and if life-threatening symptoms occur, inject with EpiPen 0.3 mg or Neffy intranasl device.  We discussed having option of Neffy which is a needle-free epinephrine device.  Informational sheet provided.  -updating alpha gal pane and shellfish panels  Perennial allergic rhinitis -Continue avoidance measures for mold, cockroach and dust mites -updating environmental allergy panel as above -take your allergy relief tab and singulair as above -May use over-the-counter fluticasone nasal spray 1 to 2 sprays each nostril once a day as needed for stuffy nose.In the right nostril, point the applicator out toward the right ear. In the left nostril, point the applicator out toward the left ear  Shortness of breath/reactive airway - Albuterol inhaler 2 puffs every 4-6 hours as needed for cough/wheeze/shortness of breath/chest tightness.  May use 15-20 minutes prior to activity.   Monitor frequency of use.    - stop Pulmicort flexhaler - start Symbicort 2 puffs twice a day with spacer device. This is a step-up from pulmicort that should provide more airway control.  - lung function testing is low for age  Control goals:  Full participation in all desired activities (may need albuterol before activity) Albuterol use two time or less a week on average (not counting use with activity) Cough interfering with sleep two time or less a month Oral steroids no more than once a year No hospitalizations  Adverse medication effect -Has had 2 reactions related to contrast.  There are premedication regimens for contrasted studies if she is not needing emergent study requiring contrast -Continue to avoid sulfa antibiotics  Schedule a follow up in 3 months or sooner if needed

## 2024-01-22 NOTE — Progress Notes (Signed)
 Follow-up Note  RE: Gwendolyn Nguyen MRN: [id number] DOB: 1986-06-06 Date of Office Visit: 01/22/2024   History of present illness: Gwendolyn Nguyen is a 38 y.o. female presenting today for follow-up of urticaria, food allergy, allergic rhinitis, shortness of breath and adverse medication effect.  Last visit was a telemedicine visit on 09/10/2022 with myself. Discussed the use of AI scribe software for clinical note transcription with the patient, who gave verbal consent to proceed.  She experiences ongoing issues with hives, itching, and intermittent shortness of breath. The hives occur weekly and are accompanied by itchiness, redness, and flushing, particularly on her face and neck. She describes the hives as random and not consistently linked to specific triggers, although she has considered red meat and alcohol as potential causes. Despite eliminating certain foods, she continues to experience symptoms, leading her to reintroduce some foods without consistent reactions. Alcohol consumption often leads to flushing.  In 2023, she underwent a red meat allergy panel which showed low positive levels for beef and lamb, but her alpha-gal level was negative. Despite these results, she continues to experience symptoms, leading her to question the significance of these findings.  She has been taking an allergy relief tablet, likely cetirizine, once daily and Benadryl as needed. However, she needs Benadryl almost daily due to the severity of her symptoms, which affect her sleep. She uses a Pulmicort Flexhaler daily for respiratory issues but reports inconsistent use over the past year due to prescription access issues.  She states she has been consistent with Pulmicort use over the past month.  She uses her albuterol inhaler a couple of times a month.  She has a history of shellfish allergy and does not have an up-to-date EpiPen device. She experiences allergy symptoms such as headaches and congestion,  particularly with the current pollen season.  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 She has a known mold allergy from previous testing.     Review of systems: 10pt ROS negative unless noted above in HPI  Past medical/social/surgical/family history have been reviewed and are unchanged unless specifically indicated below.  No changes  Medication List: Current Outpatient Medications  Medication Sig Dispense Refill   albuterol (VENTOLIN HFA) 108 (90 Base) MCG/ACT inhaler Can inhale 2 puffs every 4-6 hours as needed for cough/wheeze/shortness of breath/chest tightness. 18 g 0   cyclobenzaprine (FLEXERIL) 5 MG tablet Take 5 mg by mouth as directed. 2 times a day per GYN     diphenhydrAMINE (BENADRYL) 50 MG tablet Pt to take 50 mg of prednisone on 09/24/23 at 9:00 pm, 50 mg of prednisone on 09/25/23 at 3:00 am, and 50 mg of prednisone on 09/25/23 at 9:00 am. Pt is also to take 50 mg of benadryl on 09/25/23 at 9:00 am. Please call [phone] with any questions. 1 tablet 0   EPINEPHRINE 0.3 mg/0.3 mL IJ SOAJ injection INJECT 0.3 MG INTO THE MUSCLE AS NEEDED FOR ANAPHYLAXIS. 2 each 1   HYDROcodone-acetaminophen (NORCO/VICODIN) 5-325 MG tablet Take 1 tablet by mouth 2 (two) times daily as needed.     levocetirizine (XYZAL) 5 MG tablet Take 1 tablet (5 mg total) by mouth 2 (two) times daily. 60 tablet 5   omeprazole (PRILOSEC) 40 MG capsule Take 40 mg by mouth daily. As directed by GI     PULMICORT FLEXHALER 180 MCG/ACT inhaler Inhale 2 puffs into the lungs in the morning and at bedtime.  Rinse mouth afterwards. 3 each 1   amLODipine (NORVASC) 5 MG tablet Take 1 tablet (5 mg total) by mouth daily. (Patient not taking: Reported on 01/22/2024) 30 tablet 11   cariprazine (VRAYLAR) 1.5 MG capsule Take 1 capsule (1.5 mg total) by mouth daily. (Patient not taking: Reported on 01/22/2024) 30 capsule 3   dicyclomine  (BENTYL) 20 MG tablet Take 20 mg by mouth 3 (three) times daily. (Patient not taking: Reported on 01/22/2024)     DULoxetine (CYMBALTA) 30 MG capsule Take 2 capsules by mouth daily (Patient not taking: Reported on 01/22/2024) 120 capsule 1   estradiol (VIVELLE-DOT) 0.025 MG/24HR Place 1 patch onto the skin 2 (two) times a week. (Patient not taking: Reported on 01/22/2024)     famotidine (PEPCID) 20 MG tablet Take 1 tablet (20 mg total) by mouth 2 (two) times daily. (Patient not taking: Reported on 01/22/2024) 60 tablet 5   fluticasone (FLOVENT HFA) 110 MCG/ACT inhaler Inhale 2 puffs twice a day with spacer to help prevent cough and wheeze (Patient not taking: Reported on 01/22/2024) 1 each 1   gabapentin (NEURONTIN) 300 MG capsule Take 1 capsule (300 mg total) by mouth 3 (three) times daily. (Patient not taking: Reported on 01/22/2024) 90 capsule 2   hydrochlorothiazide (HYDRODIURIL) 25 MG tablet Take 1 tablet (25 mg total) by mouth daily. (Patient not taking: Reported on 01/22/2024) 90 tablet 1   ketorolac (TORADOL) 10 MG tablet Take 1 tablet (10 mg total) by mouth every 6 (six) hours as needed. (Patient not taking: Reported on 01/22/2024) 20 tablet 2   oxybutynin (DITROPAN) 5 MG tablet Take 5 mg by mouth every morning. (Patient not taking: Reported on 01/22/2024)     predniSONE (DELTASONE) 50 MG tablet Pt to take 50 mg of prednisone on 09/24/23 at 9:00 pm, 50 mg of prednisone on 09/25/23 at 3:00 am, and 50 mg of prednisone on 09/25/23 at 9:00 am. Pt is also to take 50 mg of benadryl on 09/25/23 at 9:00 am. Please call [phone] with any questions. (Patient not taking: Reported on 01/22/2024) 3 tablet 0   predniSONE (DELTASONE) 50 MG tablet Pt to take 50 mg of prednisone on 12/14/23 at 2:00AM, 50 mg of prednisone on 12/14/23 at 8:00AM, and 50 mg of prednisone on 12/14/23 at 2:00PM. Pt is also to take 50 mg of benadryl on 12/14/23 at 2:00PM. Please call [phone] with any questions. (Patient not taking: Reported on  01/22/2024) 3 tablet 0   pregabalin (LYRICA) 75 MG capsule Take 75 mg by mouth daily. (Patient not taking: Reported on 01/22/2024)     traMADol (ULTRAM) 50 MG tablet Take 1 tablet (50 mg total) by mouth every 6 (six) hours as needed. (Patient not taking: Reported on 01/22/2024) 30 tablet 1   valACYclovir (VALTREX) 1000 MG tablet Take 1 tablet twice a day x 3 days for cold sores outbreaks (Patient not taking: Reported on 01/22/2024) 30 tablet 2   No current facility-administered medications for this visit.     Known medication allergies: Allergies  Allergen Reactions   Beef-Derived Drug Products Hives and Itching   Gadolinium Derivatives Hives, Itching and Nausea And Vomiting    Pt vomited immediately during injection.  15 minutes later, pt developed hives and itching.    Other Hives and Itching    Lamb   Dust Mite Extract     Other Reaction(s): Unknown   Shellfish Allergy Hives   Sulfa Antibiotics Other (See Comments)    Patient was told to  NOT TAKE THIS   Sulfamethoxazole-Trimethoprim Other (See Comments)    Other reaction(s): hives   Iohexol Hives and Rash    08-2018, rash and hives, needs pre meds S/W PATIENT STATED REACTION FROM CT CONTRAST 11/19     Physical examination: Blood pressure 112/78, pulse 81, temperature 97.8 F (36.6 C), temperature source Temporal, resp. rate 18, height 5' 4.17" (1.63 m), weight 224 lb 3.2 oz (101.7 kg), last menstrual period 07/06/2019, SpO2 100%.  General: Alert, interactive, in no acute distress. HEENT: PERRLA, TMs pearly gray, turbinates mildly edematous without discharge, post-pharynx non erythematous. Neck: Supple without lymphadenopathy. Lungs: Clear to auscultation without wheezing, rhonchi or rales. {no increased work of breathing. CV: Normal S1, S2 without murmurs. Abdomen: Nondistended, nontender. Skin: Warm and dry, without lesions or rashes. Extremities:  No clubbing, cyanosis or edema. Neuro:   Grossly  intact.  Diagnositics/Labs:  Spirometry: FEV1: 1.7 L 63%, FVC: 2.13 L 66% predicted.  This appears to be quite stable from her previous studies and this as well for her demographic and age.  Assessment and plan: Chronic hives and swelling - Etiology of hives is most likely spontaneous in nature as she continues to have episodes of hives despite food avoidance below.  Hives can be caused by a variety of different triggers including illness/infection, foods, medications, stings, exercise, pressure, vibrations, extremes of temperature to name a few however majority of the time there is no identifiable trigger.   - Will update your testing with alpha gal (red meat allergy), tryptase, environmental allergy panel and chronic hive index (autoimmune hives).  - For hive control recommend take the following: Your allergy relief tab 1 tab twice a day Pepcid 20mg  1 tab twice a day Singulair 10mg  1 tab daily at bedtime - if above high-dose antihistamine regimen is not effective enough in control hive symptoms then would recommend proceeding with Xolair monthly injections.  This was briefly discussed today.   Adverse food reaction - Avoid shellfish. In case of an allergic reaction, give Benadryl 4 teaspoonfuls every 4 hours, and if life-threatening symptoms occur, inject with EpiPen 0.3 mg or Neffy intranasl device.  We discussed having option of Neffy which is a needle-free epinephrine device.  Informational sheet provided.  -updating alpha gal pane and shellfish panels  Perennial allergic rhinitis -Continue avoidance measures for mold, cockroach and dust mites -updating environmental allergy panel as above -take your allergy relief tab and singulair as above -May use over-the-counter fluticasone nasal spray 1 to 2 sprays each nostril once a day as needed for stuffy nose.In the right nostril, point the applicator out toward the right ear. In the left nostril, point the applicator out toward the left  ear  Shortness of breath/reactive airway - Albuterol inhaler 2 puffs every 4-6 hours as needed for cough/wheeze/shortness of breath/chest tightness.  May use 15-20 minutes prior to activity.   Monitor frequency of use.   - stop Pulmicort flexhaler - start Symbicort 2 puffs twice a day with spacer device. This is a step-up from pulmicort that should provide more airway control.  - lung function testing is low for age  Control goals:  Full participation in all desired activities (may need albuterol before activity) Albuterol use two time or less a week on average (not counting use with activity) Cough interfering with sleep two time or less a month Oral steroids no more than once a year No hospitalizations  Adverse medication effect -Has had 2 reactions related to contrast.  There are premedication regimens  for contrasted studies if she is not needing emergent study requiring contrast -Continue to avoid sulfa antibiotics  Schedule a follow up in 3 months or sooner if needed  I appreciate the opportunity to take part in [place] care. Please do not hesitate to contact me with questions.  Sincerely,   Margo Aye, MD Allergy/Immunology [facility]

## 2024-02-01 LAB — ALPHA-GAL PANEL
Allergen Lamb IgE: 0.26 kU/L — AB
Beef IgE: 0.26 kU/L — AB
IgE (Immunoglobulin E), Serum: 546 [IU]/mL — ABNORMAL HIGH (ref 6–495)
Pork IgE: <0.1 kU/L

## 2024-02-01 LAB — ALLERGEN PROFILE, SHELLFISH
Clam IgE: 0.22 kU/L — AB
F023-IgE Crab: 0.56 kU/L — AB
F080-IgE Lobster: 0.35 kU/L — AB
F290-IgE Oyster: <0.1 kU/L
Scallop IgE: <0.1 kU/L
Shrimp IgE: 0.77 kU/L — AB

## 2024-02-01 LAB — ALLERGENS W/TOTAL IGE AREA 2
Alternaria Alternata IgE: <0.1 kU/L
Aspergillus Fumigatus IgE: <0.1 kU/L
Bermuda Grass IgE: <0.1 kU/L
Cat Dander IgE: <0.1 kU/L
Cedar, Mountain IgE: <0.1 kU/L
Cladosporium Herbarum IgE: <0.1 kU/L
Cockroach, German IgE: 0.95 kU/L — AB
Common Silver Birch IgE: <0.1 kU/L
Cottonwood IgE: <0.1 kU/L
D Farinae IgE: 0.61 kU/L — AB
D Pteronyssinus IgE: 0.44 kU/L — AB
Dog Dander IgE: <0.1 kU/L
Elm, American IgE: <0.1 kU/L
Johnson Grass IgE: <0.1 kU/L
Maple/Box Elder IgE: <0.1 kU/L
Mouse Urine IgE: <0.1 kU/L
Oak, White IgE: <0.1 kU/L
Pecan, Hickory IgE: <0.1 kU/L
Penicillium Chrysogen IgE: <0.1 kU/L
Pigweed, Rough IgE: 0.18 kU/L — AB
Ragweed, Short IgE: <0.1 kU/L
Sheep Sorrel IgE Qn: <0.1 kU/L
Timothy Grass IgE: <0.1 kU/L
White Mulberry IgE: <0.1 kU/L

## 2024-02-01 LAB — CHRONIC URTICARIA: cu index: 5.4 (ref <10)

## 2024-02-01 LAB — TRYPTASE: Tryptase: 5.6 ug/L (ref 2.2–13.2)

## 2024-02-03 ENCOUNTER — Encounter: Payer: Self-pay | Admitting: Allergy

## 2024-02-04 ENCOUNTER — Telehealth: Payer: Self-pay

## 2024-02-04 NOTE — Telephone Encounter (Signed)
 Pharmacy Patient Advocate Encounter  Received notification from [facility] that Prior Authorization for Neffy 2MG /0.1ML solution  has been APPROVED from 02/03/2024 to 02/02/2025   PA #/Case ID/Reference #: [id number]

## 2024-03-11 ENCOUNTER — Encounter: Payer: Self-pay | Admitting: Allergy

## 2024-03-22 ENCOUNTER — Encounter: Payer: Self-pay | Admitting: Allergy

## 2024-04-20 ENCOUNTER — Ambulatory Visit: Admitting: Nurse Practitioner

## 2024-04-20 ENCOUNTER — Encounter: Payer: Self-pay | Admitting: Nurse Practitioner

## 2024-04-20 VITALS — BP 110/78 | HR 76 | Temp 97.7°F | Ht 64.0 in | Wt 214.4 lb

## 2024-04-20 DIAGNOSIS — M6281 Muscle weakness (generalized): Secondary | ICD-10-CM | POA: Diagnosis not present

## 2024-04-20 DIAGNOSIS — M2559 Pain in other specified joint: Secondary | ICD-10-CM | POA: Diagnosis not present

## 2024-04-20 DIAGNOSIS — Z2821 Immunization not carried out because of patient refusal: Secondary | ICD-10-CM

## 2024-04-20 DIAGNOSIS — Z79899 Other long term (current) drug therapy: Secondary | ICD-10-CM

## 2024-04-20 DIAGNOSIS — F321 Major depressive disorder, single episode, moderate: Secondary | ICD-10-CM

## 2024-04-20 DIAGNOSIS — R0609 Other forms of dyspnea: Secondary | ICD-10-CM

## 2024-04-20 DIAGNOSIS — E66812 Obesity, class 2: Secondary | ICD-10-CM

## 2024-04-20 DIAGNOSIS — D508 Other iron deficiency anemias: Secondary | ICD-10-CM

## 2024-04-20 DIAGNOSIS — Z6836 Body mass index (BMI) 36.0-36.9, adult: Secondary | ICD-10-CM

## 2024-04-20 DIAGNOSIS — E6609 Other obesity due to excess calories: Secondary | ICD-10-CM

## 2024-04-20 NOTE — Progress Notes (Signed)
 [name], CMA,acting as a Neurosurgeon for Gwendolyn Ada, FNP.,have documented all relevant documentation on the behalf of Gwendolyn Ada, FNP,as directed by  Gwendolyn Ada, FNP while in the presence of Gwendolyn Ada, FNP.  Subjective:  Patient ID: Gwendolyn Nguyen , female    DOB: 1986/05/15 , 38 y.o.   MRN: [id number]  Chief Complaint  Patient presents with   Hypertension    Patient presents today for a bp follow up, Patient reports compliance with medication. Patient denies any chest pain, SOB, or headaches. Patient reports she would like to be tested for MCAS- she reports she is currently on antibiotic for a yeast infection so she is unsure of what her testing will say.     HPI  Patient presents for follow up, has been seeing pain management specialist that has addressed PCP needs over past 18 months.   Patient states that she is having autoimmune symptoms but blood work has been negative.  She is having joint pain, muscle weakness, brain fog, flushing (has to take tylenol  to reduce), extreme allergy  symptoms such as itching, sneezing, dry eye, dry mouth, shortness of breath at rest and with exertion, constipation and diarrhea.  She runs a low grade fever when this occurs with chills and sweating.    She has experienced rashes/redness on her hands and legs, and this last as long as days to weeks the sicker she feels.  She last had a flare two weeks ago, she has issues with ambulation, hypertension, and rashes that last for days.  Flares occur monthly for multiple weeks, doesn't have symptoms when she is on antibiotics.  They are affected by activity and stress.  She was started on medications by her allergist that have improved these symptoms.  Has also started medications for fibromyalgia.  Benadryl  and tylenol  help the most in these cases.  She has had to get antibiotic therapy when treated at the hospital for flares.  This is affecting her ADLs and work, and at times she has to use a cane for  weakness and pain in legs and ankles.    Family history of lymphedema in mother.    Patient seen rheumatology in 2024, was told that she has an overactive immune response, was referred to allergist.  Treatments have been helpful, concerns with taking benadryl  daily.  She is taking Coq10, Vitamin D  and Vitamin B12 supplementation.       Past Medical History:  Diagnosis Date   Anemia    Asthma    Back pain    Constipation    Endometriosis    IBS (irritable bowel syndrome)    Low hemoglobin    Multiple food allergies    PID (acute pelvic inflammatory disease) 04/2019   complicated by tuboovarian abscess   TOA (tubo-ovarian abscess) 07/24/2019     Family History  Problem Relation Age of Onset   Hyperlipidemia Mother    Hypertension Mother    Obesity Mother    Hyperlipidemia Father    Hypertension Father    Obesity Father    Diabetes Maternal Grandmother    Heart disease Maternal Grandfather    Colon cancer Neg Hx    Esophageal cancer Neg Hx    Inflammatory bowel disease Neg Hx    Liver disease Neg Hx    Pancreatic cancer Neg Hx    Rectal cancer Neg Hx    Stomach cancer Neg Hx      Current Outpatient Medications:    albuterol  (VENTOLIN  HFA) 108 (90  Base) MCG/ACT inhaler, Can inhale 2 puffs every 4-6 hours as needed for cough/wheeze/shortness of breath/chest tightness., Disp: 18 g, Rfl: 0   amLODipine  (NORVASC ) 5 MG tablet, Take 5 mg by mouth daily., Disp: , Rfl:    budesonide -formoterol  (SYMBICORT ) 160-4.5 MCG/ACT inhaler, Inhale 2 puffs into the lungs 2 (two) times daily., Disp: 1 each, Rfl: 5   cyclobenzaprine  (FLEXERIL ) 5 MG tablet, Take 10 mg by mouth as directed. 2 times a day per GYN, Disp: , Rfl:    diphenhydrAMINE  (BENADRYL ) 50 MG tablet, Pt to take 50 mg of prednisone  on 09/24/23 at 9:00 pm, 50 mg of prednisone  on 09/25/23 at 3:00 am, and 50 mg of prednisone  on 09/25/23 at 9:00 am. Pt is also to take 50 mg of benadryl  on 09/25/23 at 9:00 am. Please call  [phone] with any questions., Disp: 1 tablet, Rfl: 0   EPINEPHrine  (NEFFY ) 2 MG/0.1ML SOLN, Place 1 spray into the nose as needed (Anaphylaxis)., Disp: 6 each, Rfl: 1   EPINEPHrine  0.3 mg/0.3 mL IJ SOAJ injection, Inject 0.3 mg into the muscle as needed for anaphylaxis., Disp: 2 each, Rfl: 1   famotidine  (PEPCID ) 20 MG tablet, Take 1 tablet (20 mg total) by mouth 2 (two) times daily., Disp: 60 tablet, Rfl: 5   HYDROcodone -acetaminophen  (NORCO/VICODIN) 5-325 MG tablet, Take 1 tablet by mouth 2 (two) times daily as needed., Disp: , Rfl:    levocetirizine (XYZAL ) 5 MG tablet, Take 1 tablet (5 mg total) by mouth 2 (two) times daily., Disp: 60 tablet, Rfl: 5   montelukast  (SINGULAIR ) 10 MG tablet, Take 1 tablet (10 mg total) by mouth at bedtime., Disp: 30 tablet, Rfl: 5   phentermine  15 MG capsule, Take 15 mg by mouth daily., Disp: , Rfl:    SAVELLA TITRATION PACK 12.5 & 25 & 50 MG MISC, Take 25 mg by mouth in the morning and at bedtime., Disp: , Rfl:    Allergies  Allergen Reactions   Beef-Derived Drug Products Hives and Itching   Gadolinium Derivatives Hives, Itching and Nausea And Vomiting    Pt vomited immediately during injection.  15 minutes later, pt developed hives and itching.    Other Hives and Itching    Lamb   Dust Mite Extract     Other Reaction(s): Unknown   Shellfish Allergy  Hives   Sulfa Antibiotics Other (See Comments)    Patient was told to NOT TAKE THIS   Sulfamethoxazole-Trimethoprim Other (See Comments)    Other reaction(s): hives   Iohexol  Hives and Rash    08-2018, rash and hives, needs pre meds S/W PATIENT STATED REACTION FROM CT CONTRAST 11/19     Review of Systems  Constitutional:  Positive for fatigue. Negative for chills and fever.  HENT:  Negative for ear discharge and sore throat.   Eyes:  Negative for pain, redness and itching.  Respiratory:  Positive for shortness of breath (at rest during flares). Negative for cough.   Cardiovascular:  Negative for  chest pain, palpitations and leg swelling.  Gastrointestinal:  Positive for constipation and diarrhea. Negative for nausea and vomiting.  Endocrine: Negative for polydipsia, polyphagia and polyuria.  Genitourinary:  Negative for dysuria.  Musculoskeletal:  Negative for neck pain and neck stiffness.  Skin:  Positive for rash (red hives on hands and legs during flares).  Neurological:  Positive for weakness (leg and arm weakness during flares). Negative for dizziness, light-headedness and headaches.     Today's Vitals   04/20/24 1420  BP: 110/78  Pulse: 76  Temp: 97.7 F (36.5 C)  TempSrc: Oral  Weight: 214 lb 6.4 oz (97.3 kg)  Height: 5' 4 (1.626 m)  PainSc: 0-No pain   Body mass index is 36.8 kg/m.  Wt Readings from Last 3 Encounters:  04/20/24 214 lb 6.4 oz (97.3 kg)  01/22/24 224 lb 3.2 oz (101.7 kg)  11/25/22 211 lb (95.7 kg)      Objective:  Physical Exam Constitutional:      General: She is not in acute distress. HENT:     Head: Normocephalic and atraumatic.  Cardiovascular:     Rate and Rhythm: Normal rate and regular rhythm.     Pulses: Normal pulses.     Heart sounds: Normal heart sounds.  Pulmonary:     Effort: Pulmonary effort is normal.     Breath sounds: Normal breath sounds. No wheezing or rhonchi.  Musculoskeletal:     Right lower leg: No edema.     Left lower leg: No edema.  Skin:    General: Skin is warm and dry.     Capillary Refill: Capillary refill takes less than 2 seconds.     Findings: No erythema, lesion or rash.  Neurological:     General: No focal deficit present.     Mental Status: She is alert and oriented to person, place, and time.     Motor: No weakness.     Coordination: Coordination normal.     Gait: Gait normal.  Psychiatric:        Mood and Affect: Mood normal.        Behavior: Behavior normal.        Thought Content: Thought content normal.        Judgment: Judgment normal.         Assessment And Plan:  Current  moderate episode of major depressive disorder without prior episode [facility]) Assessment & Plan: Depression screen score is now at 0, she is doing well. I had not seen her in several months up to 1 year.   Orders: -     BMP8+eGFR -     TSH + free T4  Tetanus, diphtheria, and acellular pertussis (Tdap) vaccination declined  Class 2 obesity due to excess calories with body mass index (BMI) of 36.0 to 36.9 in adult, unspecified whether serious comorbidity present Assessment & Plan: She is encouraged to strive for BMI less than 30 to decrease cardiac risk. Advised to aim for at least 150 minutes of exercise per week.    Other form of dyspnea Assessment & Plan: Will check CXR. No obvious shortness of breath. Physical exam is normal  Orders: -     DG Chest 2 View; Future  Muscle weakness Assessment & Plan: She has seen Rheumatology and has been doing better. Will recheck her labs for possible autoimmune as these have not been done in over a year.   Orders: -     Vitamin B12 -     ANA, IFA (with reflex)  Pain in other joint -     VITAMIN D  25 Hydroxy (Vit-D Deficiency, Fractures) -     ANA, IFA (with reflex)  Other long term (current) drug therapy -     CBC with Differential/Platelet -     TSH + free T4  Iron  deficiency anemia secondary to inadequate dietary iron  intake Assessment & Plan: She is being followed by Hematology  Orders: -     CBC with Differential/Platelet  Other orders -  FANA Staining Patterns    Return in about 5 months (around 09/20/2024) for phy when able.  Patient was given opportunity to ask questions. Patient verbalized understanding of the plan and was able to repeat key elements of the plan. All questions were answered to their satisfaction.    LILLETTE Gwendolyn Ada, FNP, have reviewed all documentation for this visit. The documentation on 04/20/24 for the exam, diagnosis, procedures, and orders are all accurate and complete.   I have reviewed this  encounter including the documentation in this note and/or discussed this patient with Delon Louder FNP Student. I am certifying that I agree with the content of this note as the primary care nurse practitioner.  Gwendolyn Ada, DNP, FNP-BC   IF YOU HAVE BEEN REFERRED TO A SPECIALIST, IT MAY TAKE 1-2 WEEKS TO SCHEDULE/PROCESS THE REFERRAL. IF YOU HAVE NOT HEARD FROM US /SPECIALIST IN TWO WEEKS, PLEASE GIVE US  A CALL AT [phone] X [phone].

## 2024-04-22 LAB — BMP8+EGFR
BUN/Creatinine Ratio: 12 (ref 9–23)
BUN: 12 mg/dL (ref 6–20)
CO2: 25 mmol/L (ref 20–29)
Calcium: 10.4 mg/dL — ABNORMAL HIGH (ref 8.7–10.2)
Chloride: 98 mmol/L (ref 96–106)
Creatinine, Ser: 0.97 mg/dL (ref 0.57–1.00)
Glucose: 80 mg/dL (ref 70–99)
Potassium: 4.9 mmol/L (ref 3.5–5.2)
Sodium: 139 mmol/L (ref 134–144)
eGFR: 78 mL/min/1.73 (ref >59)

## 2024-04-22 LAB — CBC WITH DIFFERENTIAL/PLATELET
Basophils Absolute: 0.1 x10E3/uL (ref 0.0–0.2)
Basos: 1 %
EOS (ABSOLUTE): 0.3 x10E3/uL (ref 0.0–0.4)
Eos: 4 %
Hematocrit: 42.4 % (ref 34.0–46.6)
Hemoglobin: 13.2 g/dL (ref 11.1–15.9)
Immature Grans (Abs): 0 x10E3/uL (ref 0.0–0.1)
Immature Granulocytes: 0 %
Lymphocytes Absolute: 2.1 x10E3/uL (ref 0.7–3.1)
Lymphs: 24 %
MCH: 25.8 pg — ABNORMAL LOW (ref 26.6–33.0)
MCHC: 31.1 g/dL — ABNORMAL LOW (ref 31.5–35.7)
MCV: 83 fL (ref 79–97)
Monocytes Absolute: 0.5 x10E3/uL (ref 0.1–0.9)
Monocytes: 5 %
Neutrophils Absolute: 5.9 x10E3/uL (ref 1.4–7.0)
Neutrophils: 66 %
Platelets: 338 x10E3/uL (ref 150–450)
RBC: 5.12 x10E6/uL (ref 3.77–5.28)
RDW: 14.4 % (ref 11.7–15.4)
WBC: 8.9 x10E3/uL (ref 3.4–10.8)

## 2024-04-22 LAB — TSH+FREE T4
Free T4: 1.27 ng/dL (ref 0.82–1.77)
TSH: 1.58 u[IU]/mL (ref 0.450–4.500)

## 2024-04-22 LAB — VITAMIN B12: Vitamin B-12: 785 pg/mL (ref 232–1245)

## 2024-04-22 LAB — ANTINUCLEAR ANTIBODIES, IFA: ANA Titer 1: POSITIVE — AB

## 2024-04-22 LAB — FANA STAINING PATTERNS: Homogeneous Pattern: 1:160 {titer} — ABNORMAL HIGH

## 2024-04-22 LAB — VITAMIN D 25 HYDROXY (VIT D DEFICIENCY, FRACTURES): Vit D, 25-Hydroxy: 52.9 ng/mL (ref 30.0–100.0)

## 2024-04-24 ENCOUNTER — Encounter: Payer: Self-pay | Admitting: Allergy

## 2024-05-01 ENCOUNTER — Ambulatory Visit: Payer: Self-pay | Admitting: Nurse Practitioner

## 2024-05-01 DIAGNOSIS — R0600 Dyspnea, unspecified: Secondary | ICD-10-CM | POA: Insufficient documentation

## 2024-05-01 DIAGNOSIS — F321 Major depressive disorder, single episode, moderate: Secondary | ICD-10-CM | POA: Insufficient documentation

## 2024-05-01 DIAGNOSIS — M2550 Pain in unspecified joint: Secondary | ICD-10-CM | POA: Insufficient documentation

## 2024-05-01 DIAGNOSIS — E6609 Other obesity due to excess calories: Secondary | ICD-10-CM | POA: Insufficient documentation

## 2024-05-01 DIAGNOSIS — M6281 Muscle weakness (generalized): Secondary | ICD-10-CM | POA: Insufficient documentation

## 2024-05-01 NOTE — Assessment & Plan Note (Signed)
 Depression screen score is now at 0, she is doing well. I had not seen her in several months up to 1 year.

## 2024-05-01 NOTE — Assessment & Plan Note (Signed)
 She is being followed by Hematology

## 2024-05-01 NOTE — Assessment & Plan Note (Signed)
 Will check CXR. No obvious shortness of breath. Physical exam is normal

## 2024-05-01 NOTE — Assessment & Plan Note (Signed)
 She has seen Rheumatology and has been doing better. Will recheck her labs for possible autoimmune as these have not been done in over a year.

## 2024-05-01 NOTE — Assessment & Plan Note (Signed)
 She is encouraged to strive for BMI less than 30 to decrease cardiac risk. Advised to aim for at least 150 minutes of exercise per week.

## 2024-05-05 ENCOUNTER — Other Ambulatory Visit: Payer: Self-pay | Admitting: Allergy

## 2024-05-05 DIAGNOSIS — L509 Urticaria, unspecified: Secondary | ICD-10-CM

## 2024-05-05 NOTE — Progress Notes (Signed)
 Tryptase ordered to obtain during a flare as previously advised pt to have tryptase drawn when she is symptomatic.  Will be able to compare this tryptase from her baseline tryptase to determine if it is a significant rise or not.

## 2024-05-07 LAB — TRYPTASE: Tryptase: 4.9 ug/L (ref 2.2–13.2)

## 2024-05-09 ENCOUNTER — Ambulatory Visit: Payer: Self-pay | Admitting: Allergy

## 2024-05-12 NOTE — Telephone Encounter (Signed)
 Can you send a copy of her most recent labs to Dr. Stacia at [facility]

## 2024-05-16 ENCOUNTER — Encounter: Payer: Self-pay | Admitting: Nurse Practitioner

## 2024-05-17 ENCOUNTER — Other Ambulatory Visit: Payer: Self-pay | Admitting: Nurse Practitioner

## 2024-05-17 MED ORDER — VALACYCLOVIR HCL 1 G PO TABS: ORAL_TABLET | ORAL | 2 refills | Status: AC

## 2024-05-18 ENCOUNTER — Ambulatory Visit: Admitting: Allergy

## 2024-05-18 ENCOUNTER — Other Ambulatory Visit: Payer: Self-pay

## 2024-05-18 VITALS — BP 138/82 | HR 100 | Temp 98.2°F | Resp 18 | Ht 65.0 in | Wt 214.9 lb

## 2024-05-18 DIAGNOSIS — L509 Urticaria, unspecified: Secondary | ICD-10-CM | POA: Diagnosis not present

## 2024-05-18 DIAGNOSIS — J3089 Other allergic rhinitis: Secondary | ICD-10-CM

## 2024-05-18 DIAGNOSIS — J4540 Moderate persistent asthma, uncomplicated: Secondary | ICD-10-CM | POA: Diagnosis not present

## 2024-05-18 DIAGNOSIS — T781XXD Other adverse food reactions, not elsewhere classified, subsequent encounter: Secondary | ICD-10-CM

## 2024-05-18 MED ORDER — NEFFY 2 MG/0.1ML NA SOLN: 1.0000 | NASAL | 1 refills | Status: DC | PRN

## 2024-05-18 MED ORDER — LEVOCETIRIZINE DIHYDROCHLORIDE 5 MG PO TABS: 5.0000 mg | ORAL_TABLET | Freq: Two times a day (BID) | ORAL | 5 refills | Status: DC

## 2024-05-18 NOTE — Progress Notes (Unsigned)
 Follow-up Note  RE: [name] MRN: [id number] DOB: [birth year] Date of Office Visit: 05/18/2024   History of present illness: Gwendolyn Nguyen is a 38 y.o. female presenting today for follow-up of urticaria, adverse food reaction, allergic rhinitis, reactive airway.  She was last seen in the office on 01/22/2024 by myself. Discussed the use of AI scribe software for clinical note transcription with the patient, who gave verbal consent to proceed.  She experiences significant fatigue, ear pain, and swollen glands. Persistent itching and hives and blotchy red rashes are present, which she associates with hives or welts, but she cannot identify a specific trigger. She mentions there is mold exposure in her office environment as a possible exacerbating factor.  Previous testing in April showed a positive reaction to shellfish and detectable but not clinically significant levels for beef, pork, and lamb. Her tryptase levels during a flare were normal.  Her symptoms fluctuate, with periods of feeling well lasting about a week before symptoms return. Itching and rashes occur without a clear trigger, and stress is noted to potentially worsen her symptoms.  Current medications include loratadine 10 mg twice daily, Singulair , and Pepcid  twice daily.   She experiences shortness of breath, particularly during flares, and uses Symbicort  as a maintenance inhaler. She rarely uses albuterol .    She does continue to avoid shellfish and has access to an epinephrine  device.  Review of systems: 10pt ROS negative unless noted above in HPI   Past medical/social/surgical/family history have been reviewed and are unchanged unless specifically indicated below.  No changes  Medication List: Current Outpatient Medications  Medication Sig Dispense Refill   albuterol  (VENTOLIN  HFA) 108 (90 Base) MCG/ACT inhaler Can inhale 2 puffs every 4-6 hours as needed for cough/wheeze/shortness of breath/chest tightness.  18 g 0   amLODipine  (NORVASC ) 5 MG tablet Take 5 mg by mouth daily.     budesonide -formoterol  (SYMBICORT ) 160-4.5 MCG/ACT inhaler Inhale 2 puffs into the lungs 2 (two) times daily. 1 each 5   cyclobenzaprine  (FLEXERIL ) 5 MG tablet Take 10 mg by mouth as directed. 2 times a day per GYN     diphenhydrAMINE  (BENADRYL ) 50 MG tablet Pt to take 50 mg of prednisone  on 09/24/23 at 9:00 pm, 50 mg of prednisone  on 09/25/23 at 3:00 am, and 50 mg of prednisone  on 09/25/23 at 9:00 am. Pt is also to take 50 mg of benadryl  on 09/25/23 at 9:00 am. Please call [phone] with any questions. 1 tablet 0   EPINEPHrine  (NEFFY ) 2 MG/0.1ML SOLN Place 1 spray into the nose as needed (Anaphylaxis). 6 each 1   EPINEPHrine  0.3 mg/0.3 mL IJ SOAJ injection Inject 0.3 mg into the muscle as needed for anaphylaxis. 2 each 1   famotidine  (PEPCID ) 20 MG tablet Take 1 tablet (20 mg total) by mouth 2 (two) times daily. 60 tablet 5   fluconazole  (DIFLUCAN ) 150 MG tablet Take by mouth.     HYDROcodone -acetaminophen  (NORCO/VICODIN) 5-325 MG tablet Take 1 tablet by mouth 2 (two) times daily as needed.     levocetirizine (XYZAL ) 5 MG tablet Take 1 tablet (5 mg total) by mouth 2 (two) times daily. 60 tablet 5   montelukast  (SINGULAIR ) 10 MG tablet Take 1 tablet (10 mg total) by mouth at bedtime. 30 tablet 5   phentermine  15 MG capsule Take 15 mg by mouth daily.     SAVELLA TITRATION PACK 12.5 & 25 & 50 MG MISC Take 25 mg by mouth in the morning and at bedtime.  valACYclovir  (VALTREX ) 1000 MG tablet Take 1 tablet twice a day x 3 days for cold sores outbreaks 30 tablet 2   No current facility-administered medications for this visit.     Known medication allergies: Allergies  Allergen Reactions   Beef-Derived Drug Products Hives and Itching   Gadolinium Derivatives Hives, Itching and Nausea And Vomiting    Pt vomited immediately during injection.  15 minutes later, pt developed hives and itching.    Other Hives and Itching     Lamb   Dust Mite Extract     Other Reaction(s): Unknown   Shellfish Allergy  Hives   Sulfa Antibiotics Other (See Comments)    Patient was told to NOT TAKE THIS   Sulfamethoxazole-Trimethoprim Other (See Comments)    Other reaction(s): hives   Iohexol  Hives and Rash    08-2018, rash and hives, needs pre meds S/W PATIENT STATED REACTION FROM CT CONTRAST 11/19     Physical examination: Blood pressure 138/82, pulse 100, temperature 98.2 F (36.8 C), temperature source Temporal, resp. rate 18, height 5' 5 (1.651 m), weight 214 lb 14.4 oz (97.5 kg), last menstrual period 07/06/2019, SpO2 98%.  General: Alert, interactive, in no acute distress. HEENT: PERRLA, TMs pearly gray, turbinates non-edematous without discharge, post-pharynx non erythematous. Neck: Supple without lymphadenopathy. Lungs: Clear to auscultation without wheezing, rhonchi or rales. {no increased work of breathing. CV: Normal S1, S2 without murmurs. Abdomen: Nondistended, nontender. Skin: Warm and dry, without lesions or rashes. Extremities:  No clubbing, cyanosis or edema. Neuro:   Grossly intact.  Diagnostics/Labs: Labs:  Component     Latest Ref Rng 05/05/2024  Tryptase     2.2 - 13.2 ug/L 4.9   Obtained during a flare and this does not have a significant rate of rise from her baseline   Assessment and plan: Chronic hives and swelling - Etiology of hives is most likely spontaneous in nature as she continues to have episodes of hives despite food avoidance below.  Hives can be caused by a variety of different triggers including illness/infection, foods, medications, stings, exercise, pressure, vibrations, extremes of temperature to name a few however majority of the time there is no identifiable trigger.   - hive panel testing was largely unremarkable - For hive control recommend take the following: Your allergy  relief tab 1 tab twice a day --> will change to Xyzal  5mg  twice a day Pepcid  20mg  1 tab twice a  day Singulair  10mg  1 tab daily at bedtime - Xolair monthly injections discussed today as next step for hive/swelling/itch control since oral regimen is not effective enough.  Protocol, benefits and risk discussed as well as mechanism of action. Tammy, our Xolair coordinator, will call you (from [place] office) about this medication and insurance coverage. Informational brochure provided.   Adverse food reaction - Avoid shellfish. In case of an allergic reaction, give Benadryl  4 teaspoonfuls every 4 hours, and if life-threatening symptoms occur, inject with EpiPen  0.3 mg or Neffy  intranasl device.  We discussed having option of Neffy  which is a needle-free epinephrine  device.  Informational sheet provided.   Perennial allergic rhinitis -Continue avoidance measures for mold, cockroach and dust mites -antihistamine and singulair  as above -May use over-the-counter fluticasone  nasal spray 1 to 2 sprays each nostril once a day as needed for stuffy nose.In the right nostril, point the applicator out toward the right ear. In the left nostril, point the applicator out toward the left ear  Shortness of breath/reactive airway - Albuterol  inhaler 2 puffs every  4-6 hours as needed for cough/wheeze/shortness of breath/chest tightness.  May use 15-20 minutes prior to activity.   Monitor frequency of use.   - continue Symbicort  2 puffs twice a day with spacer device. This is a step-up from pulmicort  that should provide more airway control.  - lung function testing is low for age  Control goals:  Full participation in all desired activities (may need albuterol  before activity) Albuterol  use two time or less a week on average (not counting use with activity) Cough interfering with sleep two time or less a month Oral steroids no more than once a year No hospitalizations  Adverse medication effect -Has had 2 reactions related to contrast.  There are premedication regimens for contrasted studies if she is not  needing emergent study requiring contrast -Continue to avoid sulfa antibiotics  Schedule a follow up in 3-4 months or sooner if needed  I appreciate the opportunity to take part in [place] care. Please do not hesitate to contact me with questions.  Sincerely,   Danita Brain, MD Allergy /Immunology [facility]

## 2024-05-18 NOTE — Patient Instructions (Addendum)
 Chronic hives and swelling - Etiology of hives is most likely spontaneous in nature as she continues to have episodes of hives despite food avoidance below.  Hives can be caused by a variety of different triggers including illness/infection, foods, medications, stings, exercise, pressure, vibrations, extremes of temperature to name a few however majority of the time there is no identifiable trigger.   - hive panel testing was largely unremarkable - For hive control recommend take the following: Your allergy  relief tab 1 tab twice a day --> will change to Xyzal  5mg  twice a day Pepcid  20mg  1 tab twice a day Singulair  10mg  1 tab daily at bedtime - Xolair monthly injections discussed today as next step for hive/swelling/itch control since oral regimen is not effective enough.  Protocol, benefits and risk discussed as well as mechanism of action. Tammy, our Xolair coordinator, will call you (from [place] office) about this medication and insurance coverage. Informational brochure provided.   Adverse food reaction - Avoid shellfish. In case of an allergic reaction, give Benadryl  4 teaspoonfuls every 4 hours, and if life-threatening symptoms occur, inject with EpiPen  0.3 mg or Neffy  intranasl device.  We discussed having option of Neffy  which is a needle-free epinephrine  device.  Informational sheet provided.   Perennial allergic rhinitis -Continue avoidance measures for mold, cockroach and dust mites -antihistamine and singulair  as above -May use over-the-counter fluticasone  nasal spray 1 to 2 sprays each nostril once a day as needed for stuffy nose.In the right nostril, point the applicator out toward the right ear. In the left nostril, point the applicator out toward the left ear  Shortness of breath/reactive airway - Albuterol  inhaler 2 puffs every 4-6 hours as needed for cough/wheeze/shortness of breath/chest tightness.  May use 15-20 minutes prior to activity.   Monitor frequency of use.   -  continue Symbicort  2 puffs twice a day with spacer device. This is a step-up from pulmicort  that should provide more airway control.  - lung function testing is low for age  Control goals:  Full participation in all desired activities (may need albuterol  before activity) Albuterol  use two time or less a week on average (not counting use with activity) Cough interfering with sleep two time or less a month Oral steroids no more than once a year No hospitalizations  Adverse medication effect -Has had 2 reactions related to contrast.  There are premedication regimens for contrasted studies if she is not needing emergent study requiring contrast -Continue to avoid sulfa antibiotics  Schedule a follow up in 3-4 months or sooner if needed

## 2024-05-19 ENCOUNTER — Encounter: Payer: Self-pay | Admitting: Allergy

## 2024-05-23 ENCOUNTER — Telehealth: Payer: Self-pay | Admitting: *Deleted

## 2024-05-23 NOTE — Telephone Encounter (Signed)
-----   Message from Danita Avelina Brain sent at 05/18/2024  6:24 PM EDT ----- Please start Xolair approval process for hives

## 2024-05-23 NOTE — Telephone Encounter (Signed)
 Called patient and advised approval, copay card and submit to [employer] for Xolair. Will reach out once delivery set to make appt to start therapy with one hour wait and initial 3 injs in clinic

## 2024-06-03 ENCOUNTER — Ambulatory Visit (INDEPENDENT_AMBULATORY_CARE_PROVIDER_SITE_OTHER): Admitting: *Deleted

## 2024-06-03 DIAGNOSIS — L501 Idiopathic urticaria: Secondary | ICD-10-CM | POA: Diagnosis not present

## 2024-06-03 MED ORDER — OMALIZUMAB 300 MG/2  ML ~~LOC~~ SOSY
300.0000 mg | PREFILLED_SYRINGE | SUBCUTANEOUS | Status: AC
  Administered 2024-06-03 – 2024-11-11 (×6): 300 mg via SUBCUTANEOUS

## 2024-06-03 NOTE — Progress Notes (Signed)
 Started Xolair  today. Received 300mg  and will continue every 4 weeks. Goal is to self administer at home after initial 3 injections in clinic. Has Epipen . Consent signed.

## 2024-07-01 ENCOUNTER — Ambulatory Visit (INDEPENDENT_AMBULATORY_CARE_PROVIDER_SITE_OTHER)

## 2024-07-01 ENCOUNTER — Encounter: Payer: Self-pay | Admitting: Nurse Practitioner

## 2024-07-01 DIAGNOSIS — L501 Idiopathic urticaria: Secondary | ICD-10-CM

## 2024-07-04 ENCOUNTER — Ambulatory Visit: Payer: Self-pay

## 2024-07-04 NOTE — Telephone Encounter (Signed)
 FYI Only or Action Required?: FYI only for provider.  Patient was last seen in primary care on 04/20/2024 by Georgina Speaks, FNP.  Called Nurse Triage reporting Vaginal Pain.  Symptoms began a week ago.  Interventions attempted: Prescription medications: Vaginal cream and Other: Coconut oil.  Symptoms are: gradually worsening.  Triage Disposition: See PCP When Office is Open (Within 3 Days)  Patient/caregiver understands and will follow disposition?: Yes     Copied from CRM [id number]. Topic: Clinical - Red Word Triage >> Jul 04, 2024  3:36 PM Fonda T wrote: [facility] that prompted transfer to Nurse Triage: Patient calling with lower bladder pain, burning and frequency with urination, and reoccurring fever blister. Reason for Disposition  [1] Vaginal itching AND [2] not improved > 3 days following Care Advice  Answer Assessment - Initial Assessment Questions Patient states she also has vaginal itching/ discomfort that is the most concerning. Patient also has bladder pain, burning and frequency with urination, and reoccurring fever blisters.  Patinet concerned about a possible infection. Uses coconut oil and vaginal cream on area for symptoms.     1. SEVERITY: How bad is the pain?  (e.g., Scale 1-10; mild, moderate, or severe)     Mild  3. PATTERN: Is pain present every time you urinate or just sometimes?      Comes and goes  4. ONSET: When did the painful urination start?      Burning started last week.  5. FEVER: Do you have a fever? If Yes, ask: What is your temperature, how was it measured, and when did it start?     Unsure but feels like she may have had one/  6. PAST UTI: Have you had a urine infection before? If Yes, ask: When was the last time? and What happened that time?      Yes  8. OTHER SYMPTOMS: Do you have any other symptoms? (e.g., blood in urine, flank pain, genital sores, urgency, vaginal discharge)     Urinary urgency and states symptoms come and  go  Protocols used: Vaginal Symptoms-A-AH

## 2024-07-06 ENCOUNTER — Encounter: Payer: Self-pay | Admitting: Nurse Practitioner

## 2024-07-06 ENCOUNTER — Ambulatory Visit: Payer: Self-pay | Admitting: Nurse Practitioner

## 2024-07-06 VITALS — BP 130/80 | HR 80 | Temp 98.4°F | Ht 65.0 in | Wt 215.0 lb

## 2024-07-06 DIAGNOSIS — Z2821 Immunization not carried out because of patient refusal: Secondary | ICD-10-CM

## 2024-07-06 DIAGNOSIS — B001 Herpesviral vesicular dermatitis: Secondary | ICD-10-CM

## 2024-07-06 DIAGNOSIS — R809 Proteinuria, unspecified: Secondary | ICD-10-CM

## 2024-07-06 DIAGNOSIS — N949 Unspecified condition associated with female genital organs and menstrual cycle: Secondary | ICD-10-CM

## 2024-07-06 DIAGNOSIS — R350 Frequency of micturition: Secondary | ICD-10-CM

## 2024-07-06 DIAGNOSIS — R300 Dysuria: Secondary | ICD-10-CM

## 2024-07-06 DIAGNOSIS — R3989 Other symptoms and signs involving the genitourinary system: Secondary | ICD-10-CM | POA: Diagnosis not present

## 2024-07-06 LAB — POCT URINALYSIS DIP (CLINITEK)
Blood, UA: NEGATIVE
Glucose, UA: NEGATIVE mg/dL
Leukocytes, UA: NEGATIVE
Nitrite, UA: NEGATIVE
POC PROTEIN,UA: 100 — AB
Spec Grav, UA: 1.025 (ref 1.010–1.025)
Urobilinogen, UA: 0.2 U/dL
pH, UA: 6 (ref 5.0–8.0)

## 2024-07-06 NOTE — Progress Notes (Signed)
 [name], CMA,acting as a Neurosurgeon for [name], FNP.,have documented all relevant documentation on the behalf of [name], FNP,as directed by  [name], FNP while in the presence of [name], FNP.  Subjective:  Patient ID: Gwendolyn Nguyen , female    DOB: 1986-09-30 , 38 y.o.   MRN: [id number]  Chief Complaint  Patient presents with   Dysuria    Patient presents today for urinary frequency, dysuria, vaginal itch, vaginal discomfort, and bladder pain.  Patient reports she has had these symptoms on and off for the past month.  She reports she keeps having fever blisters.  Urine and Swab sent out through [facility].   Discussed the use of AI scribe software for clinical note transcription with the patient, who gave verbal consent to proceed.  History of Present Illness Gwendolyn Nguyen is a 38 year old female with a history of recurrent infections who presents with urinary frequency, painful urination, and vaginal discomfort.  She has been experiencing urinary frequency, painful urination, vaginal itching, and discomfort for approximately one month. There is a sense of urgency and incomplete bladder emptying. These symptoms have been intermittent since her hysterectomy five years ago. She has a history of recurrent infections.  She denies any current vaginal discharge but reports discomfort and urgency. She has not taken any medications like AZO for UTI symptoms recently. In the past, she has used over-the-counter Monistat for yeast infections. She drinks close to sixty ounces of water daily and has had protein detected in her urine. She has not had a urinalysis done elsewhere recently, except during pain management visits where urine samples are collected but not analyzed for infections.  She mentions a potential allergy  to surgical clips from her hysterectomy, which she suspects might be causing recurrent symptoms such as temperature dysregulation and flushing. She  has not been evaluated by a urologist for these ongoing symptoms. She has a history of fibromyalgia and has experienced symptoms like heart difficulties and bladder issues post-surgery. She has not been sexually active recently.    She seen Dr. Stacia in [place]     Past Medical History:  Diagnosis Date   Anemia    Asthma    Back pain    Constipation    Endometriosis    IBS (irritable bowel syndrome)    Low hemoglobin    Multiple food allergies    PID (acute pelvic inflammatory disease) 04/2019   complicated by tuboovarian abscess   TOA (tubo-ovarian abscess) 07/24/2019     Family History  Problem Relation Age of Onset   Hyperlipidemia Mother    Hypertension Mother    Obesity Mother    Hyperlipidemia Father    Hypertension Father    Obesity Father    Diabetes Maternal Grandmother    Heart disease Maternal Grandfather    Colon cancer Neg Hx    Esophageal cancer Neg Hx    Inflammatory bowel disease Neg Hx    Liver disease Neg Hx    Pancreatic cancer Neg Hx    Rectal cancer Neg Hx    Stomach cancer Neg Hx      Current Outpatient Medications:    albuterol  (VENTOLIN  HFA) 108 (90 Base) MCG/ACT inhaler, Can inhale 2 puffs every 4-6 hours as needed for cough/wheeze/shortness of breath/chest tightness., Disp: 18 g, Rfl: 0   amLODipine  (NORVASC ) 5 MG tablet, Take 5 mg by mouth daily., Disp: , Rfl:    budesonide -formoterol  (SYMBICORT ) 160-4.5 MCG/ACT inhaler, Inhale 2 puffs  into the lungs 2 (two) times daily., Disp: 1 each, Rfl: 5   cyclobenzaprine  (FLEXERIL ) 5 MG tablet, Take 10 mg by mouth as directed. 2 times a day per GYN, Disp: , Rfl:    diphenhydrAMINE  (BENADRYL ) 50 MG tablet, Pt to take 50 mg of prednisone  on 09/24/23 at 9:00 pm, 50 mg of prednisone  on 09/25/23 at 3:00 am, and 50 mg of prednisone  on 09/25/23 at 9:00 am. Pt is also to take 50 mg of benadryl  on 09/25/23 at 9:00 am. Please call [phone] with any questions., Disp: 1 tablet, Rfl: 0   EPINEPHrine   (NEFFY ) 2 MG/0.1ML SOLN, Place 1 spray into the nose as needed (Anaphylaxis)., Disp: 6 each, Rfl: 1   EPINEPHrine  (NEFFY ) 2 MG/0.1ML SOLN, Place 1 spray into the nose as needed (Anaphylaxis)., Disp: 6 each, Rfl: 1   EPINEPHrine  0.3 mg/0.3 mL IJ SOAJ injection, Inject 0.3 mg into the muscle as needed for anaphylaxis., Disp: 2 each, Rfl: 1   famotidine  (PEPCID ) 20 MG tablet, Take 1 tablet (20 mg total) by mouth 2 (two) times daily., Disp: 60 tablet, Rfl: 5   fluconazole  (DIFLUCAN ) 150 MG tablet, Take by mouth., Disp: , Rfl:    HYDROcodone -acetaminophen  (NORCO/VICODIN) 5-325 MG tablet, Take 1 tablet by mouth 2 (two) times daily as needed., Disp: , Rfl:    levocetirizine (XYZAL ) 5 MG tablet, Take 1 tablet (5 mg total) by mouth 2 (two) times daily., Disp: 60 tablet, Rfl: 5   levocetirizine (XYZAL ) 5 MG tablet, Take 1 tablet (5 mg total) by mouth 2 (two) times daily., Disp: 60 tablet, Rfl: 5   montelukast  (SINGULAIR ) 10 MG tablet, Take 1 tablet (10 mg total) by mouth at bedtime., Disp: 30 tablet, Rfl: 5   phentermine  15 MG capsule, Take 15 mg by mouth daily., Disp: , Rfl:    SAVELLA TITRATION PACK 12.5 & 25 & 50 MG MISC, Take 25 mg by mouth in the morning and at bedtime., Disp: , Rfl:    valACYclovir  (VALTREX ) 1000 MG tablet, Take 1 tablet twice a day x 3 days for cold sores outbreaks, Disp: 30 tablet, Rfl: 2  Current Facility-Administered Medications:    omalizumab  (XOLAIR ) prefilled syringe 300 mg, 300 mg, Subcutaneous, Q28 days, Jeneal Danita Macintosh, MD, 300 mg at 07/01/24 9043   Allergies  Allergen Reactions   Beef-Derived Drug Products Hives and Itching   Gadolinium Derivatives Hives, Itching and Nausea And Vomiting    Pt vomited immediately during injection.  15 minutes later, pt developed hives and itching.    Other Hives and Itching    Lamb   Dust Mite Extract     Other Reaction(s): Unknown   Shellfish Allergy  Hives   Sulfa Antibiotics Other (See Comments)    Patient was told to NOT  TAKE THIS   Sulfamethoxazole-Trimethoprim Other (See Comments)    Other reaction(s): hives   Iohexol  Hives and Rash    08-2018, rash and hives, needs pre meds S/W PATIENT STATED REACTION FROM CT CONTRAST 11/19     Review of Systems  Constitutional: Negative.   Respiratory: Negative.    Cardiovascular: Negative.   Genitourinary:  Positive for dysuria, urgency and vaginal discharge. Negative for flank pain and hematuria.  Neurological: Negative.   Psychiatric/Behavioral: Negative.       Today's Vitals   07/06/24 1604  BP: 130/80  Pulse: 80  Temp: 98.4 F (36.9 C)  TempSrc: Oral  Weight: 215 lb (97.5 kg)  Height: 5' 5 (1.651 m)  PainSc: 0-No pain  Body mass index is 35.78 kg/m.  Wt Readings from Last 3 Encounters:  07/06/24 215 lb (97.5 kg)  05/18/24 214 lb 14.4 oz (97.5 kg)  04/20/24 214 lb 6.4 oz (97.3 kg)     Objective:  Physical Exam Vitals and nursing note reviewed.  Constitutional:      General: She is not in acute distress. HENT:     Head: Normocephalic and atraumatic.  Cardiovascular:     Rate and Rhythm: Normal rate and regular rhythm.     Pulses: Normal pulses.     Heart sounds: Normal heart sounds.  Pulmonary:     Effort: Pulmonary effort is normal.     Breath sounds: Normal breath sounds. No wheezing or rhonchi.  Musculoskeletal:     Right lower leg: No edema.     Left lower leg: No edema.  Skin:    General: Skin is warm and dry.     Capillary Refill: Capillary refill takes less than 2 seconds.     Findings: No erythema, lesion or rash.  Neurological:     General: No focal deficit present.     Mental Status: She is alert and oriented to person, place, and time.     Motor: No weakness.     Coordination: Coordination normal.     Gait: Gait normal.  Psychiatric:        Mood and Affect: Mood normal.        Behavior: Behavior normal.        Thought Content: Thought content normal.        Judgment: Judgment normal.     Assessment And Plan:   Vaginal discomfort Assessment & Plan: Vaginal itching and discomfort with no discharge. Previous yeast infection treated. - Consider further evaluation if symptoms persist or worsen. - will check PCR for infections  Orders: -     POCT URINALYSIS DIP (CLINITEK)  Bladder pain -     POCT URINALYSIS DIP (CLINITEK)  Dysuria Assessment & Plan: Intermittent urinary symptoms and bladder pain post-hysterectomy. Recent urinalysis negative for UTI. She is concerned her symptoms are related to post-surgical bladder issues or other conditions. - Send urine for culture to confirm or rule out UTI. - Refer to urologist if symptoms persist. - Repeat urinalysis in 1-2 weeks. - Consider kidney or abdominal ultrasound if symptoms persist and urinalysis is normal.  Orders: -     POCT URINALYSIS DIP (CLINITEK) -     CBC with Differential/Platelet  Urinary frequency -     POCT URINALYSIS DIP (CLINITEK) -     CBC with Differential/Platelet -     Hemoglobin A1c  Fever blister Assessment & Plan: Recurrent cold sores possibly related to stress or viral infection.   Influenza vaccination declined  Proteinuria, unspecified type Assessment & Plan: Proteinuria possibly due to dehydration. - Encourage increased fluid intake. - Repeat urinalysis in 1-2 weeks.       Return for keep same next.  Patient was given opportunity to ask questions. Patient verbalized understanding of the plan and was able to repeat key elements of the plan. All questions were answered to their satisfaction.    LILLETTE [name], FNP, have reviewed all documentation for this visit. The documentation on 07/06/24 for the exam, diagnosis, procedures, and orders are all accurate and complete.   IF YOU HAVE BEEN REFERRED TO A SPECIALIST, IT MAY TAKE 1-2 WEEKS TO SCHEDULE/PROCESS THE REFERRAL. IF YOU HAVE NOT HEARD FROM US /SPECIALIST IN TWO WEEKS, PLEASE GIVE US  A CALL  AT [phone].

## 2024-07-07 LAB — CBC WITH DIFFERENTIAL/PLATELET
Basophils Absolute: 0.1 x10E3/uL (ref 0.0–0.2)
Basos: 1 %
EOS (ABSOLUTE): 0.2 x10E3/uL (ref 0.0–0.4)
Eos: 2 %
Hematocrit: 41 % (ref 34.0–46.6)
Hemoglobin: 12.4 g/dL (ref 11.1–15.9)
Immature Grans (Abs): 0 x10E3/uL (ref 0.0–0.1)
Immature Granulocytes: 0 %
Lymphocytes Absolute: 2 x10E3/uL (ref 0.7–3.1)
Lymphs: 23 %
MCH: 25.2 pg — ABNORMAL LOW (ref 26.6–33.0)
MCHC: 30.2 g/dL — ABNORMAL LOW (ref 31.5–35.7)
MCV: 83 fL (ref 79–97)
Monocytes Absolute: 0.5 x10E3/uL (ref 0.1–0.9)
Monocytes: 5 %
Neutrophils Absolute: 5.8 x10E3/uL (ref 1.4–7.0)
Neutrophils: 69 %
Platelets: 352 x10E3/uL (ref 150–450)
RBC: 4.92 x10E6/uL (ref 3.77–5.28)
RDW: 14 % (ref 11.7–15.4)
WBC: 8.5 x10E3/uL (ref 3.4–10.8)

## 2024-07-07 LAB — HEMOGLOBIN A1C
Est. average glucose Bld gHb Est-mCnc: 108 mg/dL
Hgb A1c MFr Bld: 5.4 % (ref 4.8–5.6)

## 2024-07-13 ENCOUNTER — Ambulatory Visit: Payer: Self-pay | Admitting: Nurse Practitioner

## 2024-07-13 DIAGNOSIS — B001 Herpesviral vesicular dermatitis: Secondary | ICD-10-CM | POA: Insufficient documentation

## 2024-07-13 DIAGNOSIS — R3989 Other symptoms and signs involving the genitourinary system: Secondary | ICD-10-CM | POA: Insufficient documentation

## 2024-07-13 DIAGNOSIS — R809 Proteinuria, unspecified: Secondary | ICD-10-CM | POA: Insufficient documentation

## 2024-07-13 DIAGNOSIS — R350 Frequency of micturition: Secondary | ICD-10-CM | POA: Insufficient documentation

## 2024-07-13 DIAGNOSIS — R300 Dysuria: Secondary | ICD-10-CM | POA: Insufficient documentation

## 2024-07-13 DIAGNOSIS — N949 Unspecified condition associated with female genital organs and menstrual cycle: Secondary | ICD-10-CM | POA: Insufficient documentation

## 2024-07-13 NOTE — Assessment & Plan Note (Signed)
 Recurrent cold sores possibly related to stress or viral infection.

## 2024-07-13 NOTE — Assessment & Plan Note (Signed)
 Intermittent urinary symptoms and bladder pain post-hysterectomy. Recent urinalysis negative for UTI. She is concerned her symptoms are related to post-surgical bladder issues or other conditions. - Send urine for culture to confirm or rule out UTI. - Refer to urologist if symptoms persist. - Repeat urinalysis in 1-2 weeks. - Consider kidney or abdominal ultrasound if symptoms persist and urinalysis is normal.

## 2024-07-13 NOTE — Assessment & Plan Note (Signed)
 Vaginal itching and discomfort with no discharge. Previous yeast infection treated. - Consider further evaluation if symptoms persist or worsen. - will check PCR for infections

## 2024-07-13 NOTE — Assessment & Plan Note (Signed)
 Proteinuria possibly due to dehydration. - Encourage increased fluid intake. - Repeat urinalysis in 1-2 weeks.

## 2024-07-14 ENCOUNTER — Other Ambulatory Visit: Payer: Self-pay | Admitting: Nurse Practitioner

## 2024-07-14 ENCOUNTER — Encounter: Payer: Self-pay | Admitting: Nurse Practitioner

## 2024-07-14 MED ORDER — METRONIDAZOLE 500 MG PO TABS
500.0000 mg | ORAL_TABLET | Freq: Three times a day (TID) | ORAL | 0 refills | Status: AC
Start: 2024-07-14 — End: 2024-07-24

## 2024-07-27 ENCOUNTER — Ambulatory Visit (INDEPENDENT_AMBULATORY_CARE_PROVIDER_SITE_OTHER)

## 2024-07-27 ENCOUNTER — Telehealth: Payer: Self-pay | Admitting: *Deleted

## 2024-07-27 DIAGNOSIS — L501 Idiopathic urticaria: Secondary | ICD-10-CM

## 2024-07-27 NOTE — Telephone Encounter (Signed)
 Called patient per clinic wants to go home admin. Message sent to [employer] to reflect same and number in instructions given to patient

## 2024-08-02 ENCOUNTER — Other Ambulatory Visit

## 2024-08-02 DIAGNOSIS — N949 Unspecified condition associated with female genital organs and menstrual cycle: Secondary | ICD-10-CM

## 2024-08-04 LAB — NUSWAB VAGINITIS PLUS (VG+)
Candida albicans, NAA: POSITIVE — AB
Candida glabrata, NAA: NEGATIVE
Chlamydia trachomatis, NAA: NEGATIVE
Neisseria gonorrhoeae, NAA: NEGATIVE
Trich vag by NAA: NEGATIVE

## 2024-08-09 ENCOUNTER — Other Ambulatory Visit: Payer: Self-pay

## 2024-08-09 DIAGNOSIS — B379 Candidiasis, unspecified: Secondary | ICD-10-CM

## 2024-08-09 MED ORDER — FLUCONAZOLE 150 MG PO TABS: ORAL_TABLET | ORAL | 0 refills | Status: AC

## 2024-08-18 ENCOUNTER — Other Ambulatory Visit: Payer: Self-pay

## 2024-08-18 ENCOUNTER — Encounter: Payer: Self-pay | Admitting: Allergy

## 2024-08-18 ENCOUNTER — Ambulatory Visit: Admitting: Allergy

## 2024-08-18 VITALS — BP 126/78 | HR 87 | Temp 98.0°F | Resp 18 | Ht 65.0 in | Wt 209.3 lb

## 2024-08-18 DIAGNOSIS — T7819XD Other adverse food reactions, not elsewhere classified, subsequent encounter: Secondary | ICD-10-CM | POA: Diagnosis not present

## 2024-08-18 DIAGNOSIS — J4540 Moderate persistent asthma, uncomplicated: Secondary | ICD-10-CM | POA: Diagnosis not present

## 2024-08-18 DIAGNOSIS — J3089 Other allergic rhinitis: Secondary | ICD-10-CM

## 2024-08-18 DIAGNOSIS — L501 Idiopathic urticaria: Secondary | ICD-10-CM | POA: Diagnosis not present

## 2024-08-18 DIAGNOSIS — Z888 Allergy status to other drugs, medicaments and biological substances status: Secondary | ICD-10-CM

## 2024-08-18 DIAGNOSIS — T50905D Adverse effect of unspecified drugs, medicaments and biological substances, subsequent encounter: Secondary | ICD-10-CM

## 2024-08-18 NOTE — Progress Notes (Signed)
 Follow-up yeah so I will have to know prior and person to be that I that yeah okay yeah I do not know if   Follow-up Note  RE: Gwendolyn Nguyen MRN: [id number] DOB: Nov 12, 1985 Date of Office Visit: 08/18/2024   History of present illness: Gwendolyn Nguyen is a 38 y.o. female presenting today for follow-up of urticaria, allergic rhinitis, adverse food reaction, reactive airway and adverse medication effect.  She was last seen in the office on 05/18/24 by myself.    Discussed the use of AI scribe software for clinical note transcription with the patient, who gave verbal consent to proceed.  She has experienced no major health changes since her last visit in July, although she had bacterial vaginosis and a yeast infection, both treated with antibiotics. She is currently finishing her antibiotic course.  She is hoping now that these infections have been treated that that will also lessen the issues with her hives.  She is on Xolair  now., having received three doses, with the fourth dose due next week. She notes improvement in her hives, as well as improvement in shortness of breath, since starting Xolair .  She takes Xyzal  and Pepcid  twice a day and Singulair  daily.  She did go on vacation about 2 weeks ago and forgot her oral medications for a week and states she did not notice any worsening symptoms while off of these medications.  She did resume them when she got back home.  No current hives and no need for albuterol  or nasal spray. She is on Symbicort , taking two puffs twice a day, and has not experienced significant respiratory symptoms recently.  She reports minor allergy  symptoms such as runny nose and sneezing, but no significant issues. She has an EpiPen  and a nasal spray for emergencies, which she has not needed to use.  She continues to avoid shellfish.     Review of systems: 10pt ROS negative unless noted above in HPI   Past medical/social/surgical/family history have been reviewed and  are unchanged unless specifically indicated below.  No changes  Medication List: Current Outpatient Medications  Medication Sig Dispense Refill   albuterol  (VENTOLIN  HFA) 108 (90 Base) MCG/ACT inhaler Can inhale 2 puffs every 4-6 hours as needed for cough/wheeze/shortness of breath/chest tightness. 18 g 0   amLODipine  (NORVASC ) 5 MG tablet Take 5 mg by mouth daily.     budesonide -formoterol  (SYMBICORT ) 160-4.5 MCG/ACT inhaler Inhale 2 puffs into the lungs 2 (two) times daily. 1 each 5   cyclobenzaprine  (FLEXERIL ) 5 MG tablet Take 10 mg by mouth as directed. 2 times a day per GYN     EPINEPHrine  (NEFFY ) 2 MG/0.1ML SOLN Place 1 spray into the nose as needed (Anaphylaxis). 6 each 1   HYDROcodone -acetaminophen  (NORCO/VICODIN) 5-325 MG tablet Take 1 tablet by mouth 2 (two) times daily as needed.     levocetirizine (XYZAL ) 5 MG tablet Take 1 tablet (5 mg total) by mouth 2 (two) times daily. 60 tablet 5   levocetirizine (XYZAL ) 5 MG tablet Take 1 tablet (5 mg total) by mouth 2 (two) times daily. 60 tablet 5   montelukast  (SINGULAIR ) 10 MG tablet Take 1 tablet (10 mg total) by mouth at bedtime. 30 tablet 5   SAVELLA TITRATION PACK 12.5 & 25 & 50 MG MISC Take 25 mg by mouth in the morning and at bedtime.     diphenhydrAMINE  (BENADRYL ) 50 MG tablet Pt to take 50 mg of prednisone  on 09/24/23 at 9:00 pm, 50 mg of prednisone  on 09/25/23  at 3:00 am, and 50 mg of prednisone  on 09/25/23 at 9:00 am. Pt is also to take 50 mg of benadryl  on 09/25/23 at 9:00 am. Please call [phone] with any questions. 1 tablet 0   EPINEPHrine  (NEFFY ) 2 MG/0.1ML SOLN Place 1 spray into the nose as needed (Anaphylaxis). 6 each 1   EPINEPHrine  0.3 mg/0.3 mL IJ SOAJ injection Inject 0.3 mg into the muscle as needed for anaphylaxis. 2 each 1   famotidine  (PEPCID ) 20 MG tablet Take 1 tablet (20 mg total) by mouth 2 (two) times daily. 60 tablet 5   fluconazole  (DIFLUCAN ) 150 MG tablet Take by mouth.     fluconazole  (DIFLUCAN ) 150 MG  tablet Take one tablet by mouth at the onset of symptoms and repeat in 5 days 2 tablet 0   phentermine  15 MG capsule Take 15 mg by mouth daily.     valACYclovir  (VALTREX ) 1000 MG tablet Take 1 tablet twice a day x 3 days for cold sores outbreaks 30 tablet 2   Current Facility-Administered Medications  Medication Dose Route Frequency Provider Last Rate Last Admin   omalizumab  (XOLAIR ) prefilled syringe 300 mg  300 mg Subcutaneous Q28 days Jeneal Danita Macintosh, MD   300 mg at 07/27/24 1557     Known medication allergies: Allergies  Allergen Reactions   Bovine (Beef) Protein-Containing Drug Products Hives and Itching   Gadolinium Derivatives Hives, Itching and Nausea And Vomiting    Pt vomited immediately during injection.  15 minutes later, pt developed hives and itching.    Other Hives and Itching    Lamb   Dust Mite Extract     Other Reaction(s): Unknown   Shellfish Allergy  Hives   Sulfa Antibiotics Other (See Comments)    Patient was told to NOT TAKE THIS   Sulfamethoxazole-Trimethoprim Other (See Comments)    Other reaction(s): hives   Iohexol  Hives and Rash    08-2018, rash and hives, needs pre meds S/W PATIENT STATED REACTION FROM CT CONTRAST 11/19     Physical examination: Blood pressure 126/78, pulse 87, temperature 98 F (36.7 C), temperature source Temporal, resp. rate 18, height 5' 5 (1.651 m), weight 209 lb 4.8 oz (94.9 kg), last menstrual period 07/06/2019, SpO2 98%.  General: Alert, interactive, in no acute distress. HEENT: PERRLA, TMs pearly gray, turbinates non-edematous without discharge, post-pharynx non erythematous. Neck: Supple without lymphadenopathy. Lungs: Clear to auscultation without wheezing, rhonchi or rales. {no increased work of breathing. CV: Normal S1, S2 without murmurs. Abdomen: Nondistended, nontender. Skin: Warm and dry, without lesions or rashes. Extremities:  No clubbing, cyanosis or edema. Neuro:   Grossly  intact.  Diagnostics/Labs:  Spirometry: FEV1: 2.02L 76%, FVC: 2.43L 76% predicted.  This is a much improved study from previous and is approaching normal  Assessment and plan: Chronic hives and swelling - Etiology of hives is spontaneous in nature. Hives can be caused by a variety of different triggers including illness/infection, foods, medications, stings, exercise, pressure, vibrations, extremes of temperature to name a few however majority of the time there is no identifiable trigger.   - hive panel testing was largely unremarkable - Continue Xolair  injections once a month.  Have access to your epinephrine  device. - Now that hives are much better control recommend weaning your oral regimen as follows:         - Take Xyzal , Pepcid  and Singulair  all once a day for the next week        - If no increase in hives symptoms  then stop the medication.  If symptoms do increase then go back up on dosing  Adverse food reaction - Avoid shellfish. In case of an allergic reaction, give Benadryl  4 teaspoonfuls every 4 hours, and if life-threatening symptoms occur, inject with EpiPen  0.3 mg or Neffy  intranasl device.  Perennial allergic rhinitis -Continue avoidance measures for mold, cockroach and dust mites -antihistamine and singulair  as above -May use over-the-counter fluticasone  nasal spray 1 to 2 sprays each nostril once a day as needed for stuffy nose.In the right nostril, point the applicator out toward the right ear. In the left nostril, point the applicator out toward the left ear  Shortness of breath/reactive airway - Albuterol  inhaler 2 puffs every 4-6 hours as needed for cough/wheeze/shortness of breath/chest tightness.  May use 15-20 minutes prior to activity.   Monitor frequency of use.   - Try to wean down to Symbicort  1 puff twice a day with spacer device.  If you notice increase in symptoms then go back up to 2 puffs twice a day - lung function much improved  Control goals:  Full  participation in all desired activities (may need albuterol  before activity) Albuterol  use two time or less a week on average (not counting use with activity) Cough interfering with sleep two time or less a month Oral steroids no more than once a year No hospitalizations  Adverse medication effect -Has had 2 reactions related to contrast.  There are premedication regimens for contrasted studies if she is not needing emergent study requiring contrast -Continue to avoid sulfa antibiotics  Follow up in about 6 months or sooner if needed  I appreciate the opportunity to take part in [place] care. Please do not hesitate to contact me with questions.  Sincerely,   Danita Brain, MD Allergy /Immunology [facility]

## 2024-08-18 NOTE — Patient Instructions (Addendum)
 Chronic hives and swelling - Etiology of hives is spontaneous in nature. Hives can be caused by a variety of different triggers including illness/infection, foods, medications, stings, exercise, pressure, vibrations, extremes of temperature to name a few however majority of the time there is no identifiable trigger.   - hive panel testing was largely unremarkable - Continue Xolair  injections once a month.  Have access to your epinephrine  device. - Now that hives are much better control recommend weaning your oral regimen as follows:         - Take Xyzal , Pepcid  and Singulair  all once a day for the next week        - If no increase in hives symptoms then stop the medication.  If symptoms do increase then go back up on dosing  Adverse food reaction - Avoid shellfish. In case of an allergic reaction, give Benadryl  4 teaspoonfuls every 4 hours, and if life-threatening symptoms occur, inject with EpiPen  0.3 mg or Neffy  intranasl device.  Perennial allergic rhinitis -Continue avoidance measures for mold, cockroach and dust mites -antihistamine and singulair  as above -May use over-the-counter fluticasone  nasal spray 1 to 2 sprays each nostril once a day as needed for stuffy nose.In the right nostril, point the applicator out toward the right ear. In the left nostril, point the applicator out toward the left ear  Shortness of breath/reactive airway - Albuterol  inhaler 2 puffs every 4-6 hours as needed for cough/wheeze/shortness of breath/chest tightness.  May use 15-20 minutes prior to activity.   Monitor frequency of use.   - Try to wean down to Symbicort  1 puff twice a day with spacer device.  If you notice increase in symptoms then go back up to 2 puffs twice a day - lung function much improved  Control goals:  Full participation in all desired activities (may need albuterol  before activity) Albuterol  use two time or less a week on average (not counting use with activity) Cough interfering with  sleep two time or less a month Oral steroids no more than once a year No hospitalizations  Adverse medication effect -Has had 2 reactions related to contrast.  There are premedication regimens for contrasted studies if she is not needing emergent study requiring contrast -Continue to avoid sulfa antibiotics  Follow up in about 6 months or sooner if needed

## 2024-08-25 ENCOUNTER — Ambulatory Visit (INDEPENDENT_AMBULATORY_CARE_PROVIDER_SITE_OTHER)

## 2024-08-25 DIAGNOSIS — L501 Idiopathic urticaria: Secondary | ICD-10-CM | POA: Diagnosis not present

## 2024-09-15 ENCOUNTER — Inpatient Hospital Stay (HOSPITAL_COMMUNITY)
Admission: EM | Admit: 2024-09-15 | Discharge: 2024-09-22 | DRG: 331 | Attending: Internal Medicine | Admitting: Internal Medicine

## 2024-09-15 ENCOUNTER — Other Ambulatory Visit: Payer: Self-pay

## 2024-09-15 ENCOUNTER — Emergency Department (HOSPITAL_COMMUNITY)

## 2024-09-15 ENCOUNTER — Encounter (HOSPITAL_COMMUNITY): Payer: Self-pay

## 2024-09-15 DIAGNOSIS — J45909 Unspecified asthma, uncomplicated: Secondary | ICD-10-CM | POA: Diagnosis not present

## 2024-09-15 DIAGNOSIS — I10 Essential (primary) hypertension: Secondary | ICD-10-CM

## 2024-09-15 DIAGNOSIS — K56609 Unspecified intestinal obstruction, unspecified as to partial versus complete obstruction: Principal | ICD-10-CM | POA: Diagnosis present

## 2024-09-15 DIAGNOSIS — K660 Peritoneal adhesions (postprocedural) (postinfection): Secondary | ICD-10-CM | POA: Diagnosis not present

## 2024-09-15 LAB — COMPREHENSIVE METABOLIC PANEL WITH GFR
ALT: 14 U/L (ref 0–44)
AST: 17 U/L (ref 15–41)
Albumin: 4.2 g/dL (ref 3.5–5.0)
Alkaline Phosphatase: 91 U/L (ref 38–126)
Anion gap: 13 (ref 5–15)
BUN: 14 mg/dL (ref 6–20)
CO2: 25 mmol/L (ref 22–32)
Calcium: 9.9 mg/dL (ref 8.9–10.3)
Chloride: 100 mmol/L (ref 98–111)
Creatinine, Ser: 1.16 mg/dL — ABNORMAL HIGH (ref 0.44–1.00)
GFR, Estimated: >60 mL/min (ref >60)
Glucose, Bld: 122 mg/dL — ABNORMAL HIGH (ref 70–99)
Potassium: 3.8 mmol/L (ref 3.5–5.1)
Sodium: 138 mmol/L (ref 135–145)
Total Bilirubin: 0.7 mg/dL (ref 0.0–1.2)
Total Protein: 8.1 g/dL (ref 6.5–8.1)

## 2024-09-15 LAB — CBC
HCT: 45.6 % (ref 36.0–46.0)
Hemoglobin: 14.4 g/dL (ref 12.0–15.0)
MCH: 26 pg (ref 26.0–34.0)
MCHC: 31.6 g/dL (ref 30.0–36.0)
MCV: 82.5 fL (ref 80.0–100.0)
Platelets: 347 K/uL (ref 150–400)
RBC: 5.53 MIL/uL — ABNORMAL HIGH (ref 3.87–5.11)
RDW: 13.9 % (ref 11.5–15.5)
WBC: 12.7 K/uL — ABNORMAL HIGH (ref 4.0–10.5)
nRBC: 0 % (ref 0.0–0.2)

## 2024-09-15 LAB — LIPASE, BLOOD: Lipase: 21 U/L (ref 11–51)

## 2024-09-15 LAB — D-DIMER, QUANTITATIVE: D-Dimer, Quant: 1.75 ug{FEU}/mL — ABNORMAL HIGH (ref 0.00–0.50)

## 2024-09-15 MED ORDER — ONDANSETRON HCL 4 MG/2ML IJ SOLN
4.0000 mg | Freq: Once | INTRAMUSCULAR | Status: AC | PRN
  Administered 2024-09-15: 4 mg via INTRAVENOUS
  Filled 2024-09-15: qty 2

## 2024-09-15 MED ORDER — LACTATED RINGERS IV BOLUS
1000.0000 mL | Freq: Once | INTRAVENOUS | Status: AC
  Administered 2024-09-15: 1000 mL via INTRAVENOUS

## 2024-09-15 MED ORDER — ACETAMINOPHEN 325 MG PO TABS: 650.0000 mg | ORAL_TABLET | Freq: Four times a day (QID) | ORAL | Status: DC | PRN

## 2024-09-15 MED ORDER — OXYCODONE HCL 5 MG PO TABS
5.0000 mg | ORAL_TABLET | ORAL | Status: DC | PRN
  Administered 2024-09-15 – 2024-09-20 (×14): 5 mg via ORAL
  Filled 2024-09-15 (×15): qty 1

## 2024-09-15 MED ORDER — ONDANSETRON HCL 4 MG/2ML IJ SOLN
4.0000 mg | Freq: Once | INTRAMUSCULAR | Status: AC
  Administered 2024-09-15: 4 mg via INTRAVENOUS
  Filled 2024-09-15: qty 2

## 2024-09-15 MED ORDER — PREDNISONE 5 MG PO TABS
50.0000 mg | ORAL_TABLET | Freq: Four times a day (QID) | ORAL | Status: DC
  Administered 2024-09-15: 50 mg via ORAL
  Filled 2024-09-15: qty 2

## 2024-09-15 MED ORDER — LACTATED RINGERS IV SOLN: INTRAVENOUS | Status: AC

## 2024-09-15 MED ORDER — FENTANYL CITRATE (PF) 50 MCG/ML IJ SOSY
100.0000 ug | PREFILLED_SYRINGE | Freq: Once | INTRAMUSCULAR | Status: AC
  Administered 2024-09-15: 100 ug via INTRAVENOUS
  Filled 2024-09-15: qty 2

## 2024-09-15 MED ORDER — IOHEXOL 350 MG/ML SOLN
75.0000 mL | Freq: Once | INTRAVENOUS | Status: AC | PRN
  Administered 2024-09-15: 75 mL via INTRAVENOUS

## 2024-09-15 MED ORDER — ENOXAPARIN SODIUM 40 MG/0.4ML IJ SOSY
40.0000 mg | PREFILLED_SYRINGE | INTRAMUSCULAR | Status: DC
  Administered 2024-09-19 – 2024-09-21 (×2): 40 mg via SUBCUTANEOUS
  Filled 2024-09-15 (×4): qty 0.4

## 2024-09-15 MED ORDER — METOCLOPRAMIDE HCL 5 MG/ML IJ SOLN
10.0000 mg | Freq: Once | INTRAMUSCULAR | Status: AC
  Administered 2024-09-15: 10 mg via INTRAVENOUS
  Filled 2024-09-15: qty 2

## 2024-09-15 MED ORDER — ONDANSETRON HCL 4 MG PO TABS: 4.0000 mg | ORAL_TABLET | Freq: Four times a day (QID) | ORAL | Status: DC | PRN

## 2024-09-15 MED ORDER — FENTANYL CITRATE (PF) 50 MCG/ML IJ SOSY
50.0000 ug | PREFILLED_SYRINGE | Freq: Once | INTRAMUSCULAR | Status: AC
  Administered 2024-09-15: 50 ug via INTRAVENOUS
  Filled 2024-09-15: qty 1

## 2024-09-15 MED ORDER — LABETALOL HCL 5 MG/ML IV SOLN
10.0000 mg | INTRAVENOUS | Status: DC | PRN
  Administered 2024-09-17 – 2024-09-18 (×3): 10 mg via INTRAVENOUS
  Filled 2024-09-15 (×4): qty 4

## 2024-09-15 MED ORDER — SODIUM CHLORIDE 0.9% FLUSH
3.0000 mL | Freq: Two times a day (BID) | INTRAVENOUS | Status: DC
  Administered 2024-09-15 – 2024-09-21 (×11): 3 mL via INTRAVENOUS

## 2024-09-15 MED ORDER — HYDROMORPHONE HCL 1 MG/ML IJ SOLN
0.5000 mg | INTRAMUSCULAR | Status: DC | PRN
  Administered 2024-09-16 – 2024-09-18 (×14): 0.5 mg via INTRAVENOUS
  Filled 2024-09-15 (×14): qty 0.5

## 2024-09-15 MED ORDER — SODIUM CHLORIDE 0.9 % IV BOLUS
500.0000 mL | Freq: Once | INTRAVENOUS | Status: AC
  Administered 2024-09-15: 500 mL via INTRAVENOUS

## 2024-09-15 MED ORDER — METHYLPREDNISOLONE SODIUM SUCC 40 MG IJ SOLR
40.0000 mg | Freq: Once | INTRAMUSCULAR | Status: AC
  Administered 2024-09-15: 40 mg via INTRAVENOUS
  Filled 2024-09-15: qty 1

## 2024-09-15 MED ORDER — ACETAMINOPHEN 650 MG RE SUPP: 650.0000 mg | Freq: Four times a day (QID) | RECTAL | Status: DC | PRN

## 2024-09-15 MED ORDER — PROCHLORPERAZINE EDISYLATE 10 MG/2ML IJ SOLN
5.0000 mg | INTRAMUSCULAR | Status: DC | PRN
  Administered 2024-09-18 – 2024-09-19 (×2): 5 mg via INTRAVENOUS
  Filled 2024-09-15 (×2): qty 2

## 2024-09-15 MED ORDER — ONDANSETRON HCL 4 MG/2ML IJ SOLN
4.0000 mg | Freq: Four times a day (QID) | INTRAMUSCULAR | Status: DC | PRN
  Administered 2024-09-17 – 2024-09-19 (×2): 4 mg via INTRAVENOUS
  Filled 2024-09-15 (×2): qty 2

## 2024-09-15 MED ORDER — ALBUTEROL SULFATE (2.5 MG/3ML) 0.083% IN NEBU: 2.5000 mg | INHALATION_SOLUTION | Freq: Four times a day (QID) | RESPIRATORY_TRACT | Status: DC | PRN

## 2024-09-15 MED ORDER — DIPHENHYDRAMINE HCL 25 MG PO CAPS
25.0000 mg | ORAL_CAPSULE | Freq: Once | ORAL | Status: AC
  Filled 2024-09-15: qty 1

## 2024-09-15 MED ORDER — DIPHENHYDRAMINE HCL 25 MG PO CAPS
50.0000 mg | ORAL_CAPSULE | Freq: Once | ORAL | Status: AC
  Administered 2024-09-15: 50 mg via ORAL
  Filled 2024-09-15: qty 2

## 2024-09-15 MED ORDER — DIPHENHYDRAMINE HCL 50 MG/ML IJ SOLN
25.0000 mg | Freq: Once | INTRAMUSCULAR | Status: AC
  Administered 2024-09-15: 25 mg via INTRAVENOUS
  Filled 2024-09-15: qty 1

## 2024-09-15 MED ORDER — FLUTICASONE FUROATE-VILANTEROL 100-25 MCG/ACT IN AEPB
1.0000 | INHALATION_SPRAY | Freq: Every day | RESPIRATORY_TRACT | Status: DC
  Administered 2024-09-16 – 2024-09-21 (×6): 1 via RESPIRATORY_TRACT
  Filled 2024-09-15 (×2): qty 28

## 2024-09-15 NOTE — Consult Note (Signed)
 Reason for Consult:SBO Referring Physician: Lavonia Pat  Gwendolyn Nguyen is an 38 y.o. female.  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 She took gas-ex without relief. She had several episodes of vomiting into today so she came to the ED. W/U includes CT A/P showing mid small bowel obstruction. I was asked to see her in consultation.  Past Medical History:  Diagnosis Date   Anemia    Asthma    Back pain    Constipation    Endometriosis    IBS (irritable bowel syndrome)    Low hemoglobin    Multiple food allergies    PID (acute pelvic inflammatory disease) 04/2019   complicated by tuboovarian abscess   TOA (tubo-ovarian abscess) 07/24/2019    Past Surgical History:  Procedure Laterality Date   IR RADIOLOGIST EVAL & MGMT  05/19/2019   IR RADIOLOGIST EVAL & MGMT  05/24/2019   LAPAROSCOPIC LYSIS OF ADHESIONS N/A 07/24/2019   Procedure: Laparoscopic Lysis Of Adhesions, Drainage of Pelvic Abscess;  Surgeon: Rutherford Gain, MD;  Location: [facility] OR;  Service: Gynecology;  Laterality: N/A;   LAPAROSCOPIC LYSIS OF ADHESIONS N/A 07/24/2019   Procedure: Laparoscopic Lysis Of Adhesions and Assist With Drainage of Pelvic Abscess;  Surgeon: Sebastian Moles, MD;  Location: [facility] OR;  Service: General;  Laterality: N/A;   LAPAROSCOPY N/A 07/24/2019   Procedure: LAPAROSCOPY DIAGNOSTIC;  Surgeon: Rutherford Gain, MD;  Location: [facility] OR;  Service: Gynecology;  Laterality: N/A;   TOTAL ABDOMINAL HYSTERECTOMY W/ BILATERAL SALPINGOOPHORECTOMY  04/13/2020   full ectocervix not removed  - NEEDS PAP (done at [facility] for endometriosis)    Family History  Problem Relation Age of Onset   Hyperlipidemia Mother    Hypertension Mother    Obesity Mother    Hyperlipidemia Father    Hypertension Father    Obesity Father    Diabetes Maternal Grandmother    Heart  disease Maternal Grandfather    Colon cancer Neg Hx    Esophageal cancer Neg Hx    Inflammatory bowel disease Neg Hx    Liver disease Neg Hx    Pancreatic cancer Neg Hx    Rectal cancer Neg Hx    Stomach cancer Neg Hx     Social History:  reports that she has never smoked. She has never used smokeless tobacco. She reports current alcohol use. She reports that she does not use drugs.  Allergies:  Allergies  Allergen Reactions   Bovine (Beef) Protein-Containing Drug Products Hives and Itching   Gadolinium Derivatives Hives, Itching and Nausea And Vomiting    Pt vomited immediately during injection.  15 minutes later, pt developed hives and itching.    Other Hives and Itching    Lamb   Dust Mite Extract     Other Reaction(s): Unknown   Shellfish Allergy  Hives   Sulfa Antibiotics Other (See Comments)    Patient was told to NOT TAKE THIS   Sulfamethoxazole-Trimethoprim Other (See Comments)    Other reaction(s): hives   Iohexol  Hives and Rash    08-2018, rash and hives, needs pre meds S/W PATIENT STATED REACTION FROM CT CONTRAST 11/19    Medications: I have reviewed the patient's current medications.  Results for orders placed or performed during the hospital encounter of 09/15/24 (from the past 48 hours)  Lipase, blood     Status: None   Collection Time: 09/15/24  1:54 PM  Result Value Ref Range   Lipase 21 11 - 51 U/L    Comment: Performed at [facility], [address] [place], [place] [address]  Comprehensive metabolic panel     Status: Abnormal   Collection Time: 09/15/24  1:54 PM  Result Value Ref Range   Sodium 138 135 - 145 mmol/L   Potassium 3.8 3.5 - 5.1 mmol/L   Chloride 100 98 - 111 mmol/L   CO2 25 22 - 32 mmol/L   Glucose, Bld 122 (H) 70 - 99 mg/dL    Comment: Glucose reference range applies only to samples taken after fasting for at least 8 hours.   BUN 14 6 - 20 mg/dL   Creatinine, Ser 8.83 (H) 0.44 - 1.00 mg/dL   Calcium 9.9 8.9 - 89.6 mg/dL    Total Protein 8.1 6.5 - 8.1 g/dL   Albumin  4.2 3.5 - 5.0 g/dL   AST 17 15 - 41 U/L   ALT 14 0 - 44 U/L   Alkaline Phosphatase 91 38 - 126 U/L   Total Bilirubin 0.7 0.0 - 1.2 mg/dL   GFR, Estimated >39 >39 mL/min    Comment: (NOTE) Calculated using the CKD-EPI Creatinine Equation (2021)    Anion gap 13 5 - 15    Comment: Performed at [facility], [address], [place], [place] [address]  CBC     Status: Abnormal   Collection Time: 09/15/24  1:54 PM  Result Value Ref Range   WBC 12.7 (H) 4.0 - 10.5 K/uL   RBC 5.53 (H) 3.87 - 5.11 MIL/uL   Hemoglobin 14.4 12.0 - 15.0 g/dL   HCT 54.3 63.9 - 53.9 %   MCV 82.5 80.0 - 100.0 fL   MCH 26.0 26.0 - 34.0 pg   MCHC 31.6 30.0 - 36.0 g/dL   RDW 86.0 88.4 - 84.4 %   Platelets 347 150 - 400 K/uL   nRBC 0.0 0.0 - 0.2 %    Comment: Performed at [facility], [address] [place], [place] [address]  D-dimer, quantitative     Status: Abnormal   Collection Time: 09/15/24  3:48 PM  Result Value Ref Range   D-Dimer, Quant 1.75 (H) 0.00 - 0.50 ug/mL-FEU    Comment: (NOTE) At the manufacturer cut-off value of 0.5 g/mL FEU, this assay has a negative predictive value of 95-100%.This assay is intended for use in conjunction with a clinical pretest probability (PTP) assessment model to exclude pulmonary embolism (PE) and deep venous thrombosis (DVT) in outpatients suspected of PE or DVT. Results should be correlated with clinical presentation. Performed at [facility], [address], [place], [place] [address]     CT ABDOMEN PELVIS W CONTRAST Result Date: 09/15/2024 EXAM: CT ABDOMEN AND PELVIS WITH CONTRAST 09/15/2024 09:02:40 PM TECHNIQUE: CT of the abdomen and pelvis was performed with the administration of 75 mL of iohexol  (OMNIPAQUE ) 350 MG/ML injection. Multiplanar reformatted images are provided for review. Automated exposure control, iterative reconstruction, and/or weight-based adjustment of the mA/kV was utilized to  reduce the radiation dose to as low as reasonably achievable. COMPARISON: None available. CLINICAL HISTORY: evaluate intraabdominal pathology, we are giving pretreatment FINDINGS: LOWER CHEST: No acute abnormality. LIVER: The liver is unremarkable. GALLBLADDER AND BILE DUCTS: Gallbladder is unremarkable. No biliary ductal dilatation. SPLEEN: No acute abnormality. PANCREAS: No acute abnormality. ADRENAL GLANDS: No acute abnormality. KIDNEYS, URETERS AND BLADDER: No stones in the kidneys or ureters. No  hydronephrosis. No perinephric or periureteral stranding. Urinary bladder is unremarkable. GI AND BOWEL: Stomach demonstrates no acute abnormality. Dilated proximal and mid small bowel loops. Mid to distal small bowel decompressed. Findings compatible with mid small bowel obstruction. Exact transition and cause not visualized. PERITONEUM AND RETROPERITONEUM: Trace perihepatic ascites. No free air. VASCULATURE: Aorta is normal in caliber. LYMPH NODES: No lymphadenopathy. REPRODUCTIVE ORGANS: Prior hysterectomy. No adnexal mass. BONES AND SOFT TISSUES: No acute osseous abnormality. No focal soft tissue abnormality. IMPRESSION: 1. Mid small bowel obstruction; exact transition and cause not visualized. 2. Trace perihepatic ascites. Electronically signed by: Franky Crease MD 09/15/2024 09:14 PM EST RP Workstation: [id number]   CT Angio Chest PE W and/or Wo Contrast Result Date: 09/15/2024 EXAM: CTA CHEST 09/15/2024 09:02:40 PM TECHNIQUE: CTA of the chest was performed after the administration of 75 mL of iohexol  (OMNIPAQUE ) 350 MG/ML injection. Multiplanar reformatted images are provided for review. MIP images are provided for review. Automated exposure control, iterative reconstruction, and/or weight based adjustment of the mA/kV was utilized to reduce the radiation dose to as low as reasonably achievable. COMPARISON: 06/24/2019. CLINICAL HISTORY: evaluate for pe evaluate for pe FINDINGS: PULMONARY ARTERIES: Pulmonary  arteries are adequately opacified for evaluation. No acute pulmonary embolus. Main pulmonary artery is normal in caliber. MEDIASTINUM: The heart and pericardium demonstrate no acute abnormality. There is no acute abnormality of the thoracic aorta. LYMPH NODES: No mediastinal, hilar or axillary lymphadenopathy. LUNGS AND PLEURA: Right base atelectasis. No focal consolidation or pulmonary edema. No evidence of pleural effusion or pneumothorax. UPPER ABDOMEN: Limited images of the upper abdomen are unremarkable. SOFT TISSUES AND BONES: No acute bone or soft tissue abnormality. IMPRESSION: 1. No pulmonary embolism. 2. No acute cardiopulmonary disease. Electronically signed by: Franky Crease MD 09/15/2024 09:10 PM EST RP Workstation: [id number]    Review of Systems  Constitutional:  Positive for appetite change.  HENT: Negative.    Eyes: Negative.   Respiratory: Negative.    Cardiovascular: Negative.   Gastrointestinal:  Positive for abdominal distention, abdominal pain, constipation, nausea and vomiting.  Endocrine: Negative.   Genitourinary: Negative.   Musculoskeletal: Negative.   Allergic/Immunologic: Negative.   Neurological: Negative.   Hematological: Negative.   Psychiatric/Behavioral: Negative.     Blood pressure (!) 132/90, pulse 80, temperature 98 F (36.7 C), temperature source Oral, resp. rate 14, height 5' 4 (1.626 m), weight 99.8 kg, last menstrual period 07/06/2019, SpO2 100%. Physical Exam Constitutional:      Appearance: She is not ill-appearing.  HENT:     Head: Normocephalic.  Cardiovascular:     Rate and Rhythm: Normal rate and regular rhythm.  Pulmonary:     Effort: Pulmonary effort is normal.     Breath sounds: No wheezing.  Abdominal:     General: There is distension.     Palpations: Abdomen is soft.     Tenderness: There is abdominal tenderness in the epigastric area. There is no guarding or rebound.  Skin:    General: Skin is warm.     Capillary Refill:  Capillary refill takes less than 2 seconds.  Neurological:     Mental Status: She is alert and oriented to person, place, and time.  Psychiatric:        Mood and Affect: Mood normal.     Assessment/Plan: SBO History of multiple previous abdominal surgeries  Agree with medical admission, IVF, bowel rest (ice chips and sips water OK). Her stomach is not distended so can hold off on NGT. Will consider  SBO protocol with PO contrast if able to clarify her contrast allergies. No need for emergent surgery. We will follow.   Dann FORBES Hummer 09/15/2024, 10:17 PM

## 2024-09-15 NOTE — ED Triage Notes (Signed)
 C.O abd pain since yesterday, n/v. Last BM was 2 days ago. C/O SHOB with pain.

## 2024-09-15 NOTE — ED Provider Notes (Signed)
 [place] EMERGENCY DEPARTMENT AT [facility] Provider Note   CSN: [id number] Arrival date & time: 09/15/24  1338     Patient presents with: Abdominal Pain   Mee Macdonnell is a 38 y.o. female. Hx of anemia, asthma, IBS, PID and TOA in 2020, SBO, food allergies, angioedema p/w abd pain, N/V since yesterday. Endorses last BM was 2 days ago. Hx per pt. endorses she developed sudden onset nausea, vomiting, and epigastric abdominal pains yesterday.  She also endorses generalized abdominal pain, with some abdominal pain in the left lower quadrant.  She states this feels very similar to prior episode of SBO that required surgical intervention.  She states the vomit is nonbloody nonbilious.  Denies fever or chills.  Denies chest pain or shortness of breath.  History of total hysterectomy, including ovaries.    Abdominal Pain      Prior to Admission medications   Medication Sig Start Date End Date Taking? Authorizing Provider  albuterol  (VENTOLIN  HFA) 108 (90 Base) MCG/ACT inhaler Can inhale 2 puffs every 4-6 hours as needed for cough/wheeze/shortness of breath/chest tightness. 05/13/23   Jeneal Danita Macintosh, MD  amLODipine  (NORVASC ) 5 MG tablet Take 5 mg by mouth daily. 04/12/24   [provider]  budesonide -formoterol  (SYMBICORT ) 160-4.5 MCG/ACT inhaler Inhale 2 puffs into the lungs 2 (two) times daily. 01/22/24   Jeneal Danita Macintosh, MD  cyclobenzaprine  (FLEXERIL ) 5 MG tablet Take 10 mg by mouth as directed. 2 times a day per GYN    [provider]  diphenhydrAMINE  (BENADRYL ) 50 MG tablet Pt to take 50 mg of prednisone  on 09/24/23 at 9:00 pm, 50 mg of prednisone  on 09/25/23 at 3:00 am, and 50 mg of prednisone  on 09/25/23 at 9:00 am. Pt is also to take 50 mg of benadryl  on 09/25/23 at 9:00 am. Please call [phone] with any questions. 12/04/23   Suttle, Ester PARAS, MD  EPINEPHrine  (NEFFY ) 2 MG/0.1ML SOLN Place 1 spray into the nose as needed (Anaphylaxis).  01/22/24   Jeneal Danita Macintosh, MD  EPINEPHrine  (NEFFY ) 2 MG/0.1ML SOLN Place 1 spray into the nose as needed (Anaphylaxis). 05/18/24   Jeneal Danita Macintosh, MD  EPINEPHrine  0.3 mg/0.3 mL IJ SOAJ injection Inject 0.3 mg into the muscle as needed for anaphylaxis. 01/22/24   Jeneal Danita Macintosh, MD  famotidine  (PEPCID ) 20 MG tablet Take 1 tablet (20 mg total) by mouth 2 (two) times daily. 01/22/24   Jeneal Danita Macintosh, MD  fluconazole  (DIFLUCAN ) 150 MG tablet Take by mouth.    [provider]  fluconazole  (DIFLUCAN ) 150 MG tablet Take one tablet by mouth at the onset of symptoms and repeat in 5 days 08/09/24   Georgina Speaks, FNP  HYDROcodone -acetaminophen  (NORCO/VICODIN) 5-325 MG tablet Take 1 tablet by mouth 2 (two) times daily as needed. 07/15/22   [provider]  levocetirizine (XYZAL ) 5 MG tablet Take 1 tablet (5 mg total) by mouth 2 (two) times daily. 09/10/22   Jeneal Danita Macintosh, MD  levocetirizine (XYZAL ) 5 MG tablet Take 1 tablet (5 mg total) by mouth 2 (two) times daily. 05/18/24   Jeneal Danita Macintosh, MD  montelukast  (SINGULAIR ) 10 MG tablet Take 1 tablet (10 mg total) by mouth at bedtime. 01/22/24   Jeneal Danita Macintosh, MD  phentermine  15 MG capsule Take 15 mg by mouth daily. 04/07/24   [provider]  SAVELLA TITRATION PACK 12.5 & 25 & 50 MG MISC Take 25 mg by mouth in the morning and at bedtime. 04/08/24  [provider]  valACYclovir  (VALTREX ) 1000 MG tablet Take 1 tablet twice a day x 3 days for cold sores outbreaks 05/17/24   Georgina Speaks, FNP    Allergies: Bovine (beef) protein-containing drug products, Gadolinium derivatives, Other, Dust mite extract, Shellfish allergy , Sulfa antibiotics, Sulfamethoxazole-trimethoprim, and Iohexol     Review of Systems  Gastrointestinal:  Positive for abdominal pain.    Updated Vital Signs BP (!) 168/106 (BP Location: Left Arm)   Pulse 61   Temp 97.7 F (36.5 C) (Oral)    Resp 16   Ht 5' 4 (1.626 m)   Wt 99.8 kg   LMP 07/06/2019 (Approximate)   SpO2 99%   BMI 37.76 kg/m   Physical Exam Vitals and nursing note reviewed.  Constitutional:      General: She is not in acute distress.    Appearance: She is well-developed.  HENT:     Head: Normocephalic and atraumatic.  Eyes:     Conjunctiva/sclera: Conjunctivae normal.  Cardiovascular:     Rate and Rhythm: Regular rhythm. Tachycardia present.     Heart sounds: No murmur heard. Pulmonary:     Effort: Pulmonary effort is normal. No respiratory distress.     Breath sounds: Normal breath sounds.  Abdominal:     General: Abdomen is flat.     Palpations: Abdomen is soft.     Tenderness: There is generalized abdominal tenderness (Mild generalized TTP) and tenderness in the epigastric area and left lower quadrant. There is no right CVA tenderness, left CVA tenderness, guarding or rebound. Negative signs include Murphy's sign, Rovsing's sign and McBurney's sign.  Musculoskeletal:        General: No swelling.     Cervical back: Neck supple.  Skin:    General: Skin is warm and dry.     Capillary Refill: Capillary refill takes less than 2 seconds.  Neurological:     Mental Status: She is alert and oriented to person, place, and time.  Psychiatric:        Mood and Affect: Mood normal.     (all labs ordered are listed, but only abnormal results are displayed) Labs Reviewed  COMPREHENSIVE METABOLIC PANEL WITH GFR - Abnormal; Notable for the following components:      Result Value   Glucose, Bld 122 (*)    Creatinine, Ser 1.16 (*)    All other components within normal limits  CBC - Abnormal; Notable for the following components:   WBC 12.7 (*)    RBC 5.53 (*)    All other components within normal limits  D-DIMER, QUANTITATIVE - Abnormal; Notable for the following components:   D-Dimer, Quant 1.75 (*)    All other components within normal limits  LIPASE, BLOOD  URINALYSIS, ROUTINE W REFLEX MICROSCOPIC   HIV ANTIBODY (ROUTINE TESTING W REFLEX)  BASIC METABOLIC PANEL WITH GFR  MAGNESIUM   CBC    EKG: None  Radiology: CT ABDOMEN PELVIS W CONTRAST Result Date: 09/15/2024 EXAM: CT ABDOMEN AND PELVIS WITH CONTRAST 09/15/2024 09:02:40 PM TECHNIQUE: CT of the abdomen and pelvis was performed with the administration of 75 mL of iohexol  (OMNIPAQUE ) 350 MG/ML injection. Multiplanar reformatted images are provided for review. Automated exposure control, iterative reconstruction, and/or weight-based adjustment of the mA/kV was utilized to reduce the radiation dose to as low as reasonably achievable. COMPARISON: None available. CLINICAL HISTORY: evaluate intraabdominal pathology, we are giving pretreatment FINDINGS: LOWER CHEST: No acute abnormality. LIVER: The liver is unremarkable. GALLBLADDER AND BILE DUCTS: Gallbladder is unremarkable. No biliary  ductal dilatation. SPLEEN: No acute abnormality. PANCREAS: No acute abnormality. ADRENAL GLANDS: No acute abnormality. KIDNEYS, URETERS AND BLADDER: No stones in the kidneys or ureters. No hydronephrosis. No perinephric or periureteral stranding. Urinary bladder is unremarkable. GI AND BOWEL: Stomach demonstrates no acute abnormality. Dilated proximal and mid small bowel loops. Mid to distal small bowel decompressed. Findings compatible with mid small bowel obstruction. Exact transition and cause not visualized. PERITONEUM AND RETROPERITONEUM: Trace perihepatic ascites. No free air. VASCULATURE: Aorta is normal in caliber. LYMPH NODES: No lymphadenopathy. REPRODUCTIVE ORGANS: Prior hysterectomy. No adnexal mass. BONES AND SOFT TISSUES: No acute osseous abnormality. No focal soft tissue abnormality. IMPRESSION: 1. Mid small bowel obstruction; exact transition and cause not visualized. 2. Trace perihepatic ascites. Electronically signed by: Franky Crease MD 09/15/2024 09:14 PM EST RP Workstation: [id number]   CT Angio Chest PE W and/or Wo Contrast Result Date:  09/15/2024 EXAM: CTA CHEST 09/15/2024 09:02:40 PM TECHNIQUE: CTA of the chest was performed after the administration of 75 mL of iohexol  (OMNIPAQUE ) 350 MG/ML injection. Multiplanar reformatted images are provided for review. MIP images are provided for review. Automated exposure control, iterative reconstruction, and/or weight based adjustment of the mA/kV was utilized to reduce the radiation dose to as low as reasonably achievable. COMPARISON: 06/24/2019. CLINICAL HISTORY: evaluate for pe evaluate for pe FINDINGS: PULMONARY ARTERIES: Pulmonary arteries are adequately opacified for evaluation. No acute pulmonary embolus. Main pulmonary artery is normal in caliber. MEDIASTINUM: The heart and pericardium demonstrate no acute abnormality. There is no acute abnormality of the thoracic aorta. LYMPH NODES: No mediastinal, hilar or axillary lymphadenopathy. LUNGS AND PLEURA: Right base atelectasis. No focal consolidation or pulmonary edema. No evidence of pleural effusion or pneumothorax. UPPER ABDOMEN: Limited images of the upper abdomen are unremarkable. SOFT TISSUES AND BONES: No acute bone or soft tissue abnormality. IMPRESSION: 1. No pulmonary embolism. 2. No acute cardiopulmonary disease. Electronically signed by: Franky Crease MD 09/15/2024 09:10 PM EST RP Workstation: [id number]     Procedures   Medications Ordered in the ED  fluticasone  furoate-vilanterol (BREO ELLIPTA ) 100-25 MCG/ACT 1 puff (has no administration in time range)  enoxaparin  (LOVENOX ) injection 40 mg (has no administration in time range)  sodium chloride  flush (NS) 0.9 % injection 3 mL (3 mLs Intravenous Given 09/15/24 2258)  acetaminophen  (TYLENOL ) tablet 650 mg (has no administration in time range)    Or  acetaminophen  (TYLENOL ) suppository 650 mg (has no administration in time range)  lactated ringers  infusion ( Intravenous New Bag/Given 09/15/24 2312)  oxyCODONE  (Oxy IR/ROXICODONE ) immediate release tablet 5 mg (5 mg Oral Given  09/15/24 2306)  HYDROmorphone  (DILAUDID ) injection 0.5 mg (0.5 mg Intravenous Given 09/16/24 0036)  ondansetron  (ZOFRAN ) tablet 4 mg (has no administration in time range)    Or  ondansetron  (ZOFRAN ) injection 4 mg (has no administration in time range)  prochlorperazine  (COMPAZINE ) injection 5 mg (has no administration in time range)  albuterol  (PROVENTIL ) (2.5 MG/3ML) 0.083% nebulizer solution 2.5 mg (has no administration in time range)  labetalol  (NORMODYNE ) injection 10 mg (has no administration in time range)  ondansetron  (ZOFRAN ) injection 4 mg (4 mg Intravenous Given 09/15/24 1357)  lactated ringers  bolus 1,000 mL (1,000 mLs Intravenous New Bag/Given 09/15/24 1549)  fentaNYL  (SUBLIMAZE ) injection 50 mcg (50 mcg Intravenous Given 09/15/24 1628)  diphenhydrAMINE  (BENADRYL ) capsule 50 mg (50 mg Oral Given 09/15/24 1623)  metoCLOPramide  (REGLAN ) injection 10 mg (10 mg Intravenous Given 09/15/24 1626)  diphenhydrAMINE  (BENADRYL ) injection 25 mg (25 mg Intravenous Given 09/15/24 1627)  methylPREDNISolone   sodium succinate (SOLU-MEDROL ) 40 mg/mL injection 40 mg (40 mg Intravenous Given 09/15/24 1656)  diphenhydrAMINE  (BENADRYL ) capsule 25 mg ( Oral See Alternative 09/15/24 2040)    Or  diphenhydrAMINE  (BENADRYL ) injection 25 mg (25 mg Intravenous Given 09/15/24 2040)  fentaNYL  (SUBLIMAZE ) injection 100 mcg (100 mcg Intravenous Given 09/15/24 2239)  ondansetron  (ZOFRAN ) injection 4 mg (4 mg Intravenous Given 09/15/24 2240)  iohexol  (OMNIPAQUE ) 350 MG/ML injection 75 mL (75 mLs Intravenous Contrast Given 09/15/24 2104)  sodium chloride  0.9 % bolus 500 mL (500 mLs Intravenous New Bag/Given 09/15/24 2303)    Clinical Course as of 09/16/24 0045  Thu Sep 15, 2024  2009 Imaging can occur approx 8:30 pm today [BS]    Clinical Course User Index [BS] Arlee Katz, MD                                 Medical Decision Making Amount and/or Complexity of Data Reviewed Labs:  ordered. Radiology: ordered.  Risk Prescription drug management. Decision regarding hospitalization.   Based on patient presentation, history, evaluation, high suspicion for SBO.  Patient with nausea and vomiting for 2 days, has not been passing flatus, and has remained constipated.  Patient also endorses symptoms are very similar to prior SBO.  Imaging overall reassuring against evidence of PE, however evidence of SBO without clear transition point.  Discussed with general surgery, agreeable with plan to place NG tube, admit to medicine at this time.  Discussed with hospitalist team, agreeable to admission.  Pain well-controlled in the ED, has remained overall hemodynamically stable, mild evidence of AKI while in the ED fluid resuscitated.  Hemodynamically stable at time of admission.     Final diagnoses:  SBO (small bowel obstruction) [facility])    ED Discharge Orders     None          Arlee Katz, MD 09/16/24 [id number]    Simon Lavonia SAILOR, MD 09/16/24 2238

## 2024-09-15 NOTE — H&P (Signed)
 History and Physical    Gwendolyn Nguyen FMW:[id number] DOB: 01-01-1986 DOA: 09/15/2024  PCP: Jerome Heron Ruth, PA-C   Patient coming from: Home   Chief Complaint: Abdominal pain, N/V   HPI: Gwendolyn Nguyen is a 38 y.o. female with medical history significant for depression, fibromyalgia, hypertension, urticaria, reactive airways disease, history of abdominal hysterectomy with bilateral salpingo-oophorectomy, and laparoscopic lysis of adhesions in 2020 who presents with abdominal pain, nausea, and vomiting.  Patient reports developing generalized crampy abdominal discomfort yesterday shortly after eating.  She has continued to have abdominal discomfort as well as nausea and several episodes of vomiting.  ED Course: Upon arrival to the ED, patient is found to be afebrile and saturating well on room air with normal HR and elevated BP.  Labs are most notable for creatinine 1.16, WBC 12,700, normal LFTs and lipase, and D-dimer 1.75.  There is no PE or other acute finding on CTA chest.  CT of the abdomen and pelvis reveals SBO.  Surgery (Dr. Sebastian) was consulted by the ED physician and the patient was treated with Zofran , Reglan , and fentanyl .  Review of Systems:  All other systems reviewed and apart from HPI, are negative.  Past Medical History:  Diagnosis Date   Anemia    Asthma    Back pain    Constipation    Endometriosis    IBS (irritable bowel syndrome)    Low hemoglobin    Multiple food allergies    PID (acute pelvic inflammatory disease) 04/2019   complicated by tuboovarian abscess   TOA (tubo-ovarian abscess) 07/24/2019    Past Surgical History:  Procedure Laterality Date   IR RADIOLOGIST EVAL & MGMT  05/19/2019   IR RADIOLOGIST EVAL & MGMT  05/24/2019   LAPAROSCOPIC LYSIS OF ADHESIONS N/A 07/24/2019   Procedure: Laparoscopic Lysis Of Adhesions, Drainage of Pelvic Abscess;  Surgeon: Rutherford Gain, MD;  Location: [facility] OR;  Service: Gynecology;  Laterality: N/A;    LAPAROSCOPIC LYSIS OF ADHESIONS N/A 07/24/2019   Procedure: Laparoscopic Lysis Of Adhesions and Assist With Drainage of Pelvic Abscess;  Surgeon: Sebastian Moles, MD;  Location: [facility] OR;  Service: General;  Laterality: N/A;   LAPAROSCOPY N/A 07/24/2019   Procedure: LAPAROSCOPY DIAGNOSTIC;  Surgeon: Rutherford Gain, MD;  Location: [facility] OR;  Service: Gynecology;  Laterality: N/A;   TOTAL ABDOMINAL HYSTERECTOMY W/ BILATERAL SALPINGOOPHORECTOMY  04/13/2020   full ectocervix not removed  - NEEDS PAP (done at [facility] for endometriosis)    Social History:   reports that she has never smoked. She has never used smokeless tobacco. She reports current alcohol use. She reports that she does not use drugs.  Allergies  Allergen Reactions   Bovine (Beef) Protein-Containing Drug Products Hives and Itching   Gadolinium Derivatives Hives, Itching and Nausea And Vomiting    Pt vomited immediately during injection.  15 minutes later, pt developed hives and itching.    Other Hives and Itching    Lamb   Dust Mite Extract     Other Reaction(s): Unknown   Shellfish Allergy  Hives   Sulfa Antibiotics Other (See Comments)    Patient was told to NOT TAKE THIS   Sulfamethoxazole-Trimethoprim Other (See Comments)    Other reaction(s): hives   Iohexol  Hives and Rash    08-2018, rash and hives, needs pre meds S/W PATIENT STATED REACTION FROM CT CONTRAST 11/19    Family History  Problem Relation Age of Onset   Hyperlipidemia Mother    Hypertension Mother    Obesity Mother  Hyperlipidemia Father    Hypertension Father    Obesity Father    Diabetes Maternal Grandmother    Heart disease Maternal Grandfather    Colon cancer Neg Hx    Esophageal cancer Neg Hx    Inflammatory bowel disease Neg Hx    Liver disease Neg Hx    Pancreatic cancer Neg Hx    Rectal cancer Neg Hx    Stomach cancer Neg Hx      Prior to Admission medications   Medication Sig Start Date End Date Taking? Authorizing Provider  albuterol   (VENTOLIN  HFA) 108 (90 Base) MCG/ACT inhaler Can inhale 2 puffs every 4-6 hours as needed for cough/wheeze/shortness of breath/chest tightness. 05/13/23   Jeneal Danita Macintosh, MD  amLODipine  (NORVASC ) 5 MG tablet Take 5 mg by mouth daily. 04/12/24   [provider]  budesonide -formoterol  (SYMBICORT ) 160-4.5 MCG/ACT inhaler Inhale 2 puffs into the lungs 2 (two) times daily. 01/22/24   Jeneal Danita Macintosh, MD  cyclobenzaprine  (FLEXERIL ) 5 MG tablet Take 10 mg by mouth as directed. 2 times a day per GYN    [provider]  diphenhydrAMINE  (BENADRYL ) 50 MG tablet Pt to take 50 mg of prednisone  on 09/24/23 at 9:00 pm, 50 mg of prednisone  on 09/25/23 at 3:00 am, and 50 mg of prednisone  on 09/25/23 at 9:00 am. Pt is also to take 50 mg of benadryl  on 09/25/23 at 9:00 am. Please call [phone] with any questions. 12/04/23   Suttle, Ester PARAS, MD  EPINEPHrine  (NEFFY ) 2 MG/0.1ML SOLN Place 1 spray into the nose as needed (Anaphylaxis). 01/22/24   Jeneal Danita Macintosh, MD  EPINEPHrine  (NEFFY ) 2 MG/0.1ML SOLN Place 1 spray into the nose as needed (Anaphylaxis). 05/18/24   Jeneal Danita Macintosh, MD  EPINEPHrine  0.3 mg/0.3 mL IJ SOAJ injection Inject 0.3 mg into the muscle as needed for anaphylaxis. 01/22/24   Jeneal Danita Macintosh, MD  famotidine  (PEPCID ) 20 MG tablet Take 1 tablet (20 mg total) by mouth 2 (two) times daily. 01/22/24   Jeneal Danita Macintosh, MD  fluconazole  (DIFLUCAN ) 150 MG tablet Take by mouth.    [provider]  fluconazole  (DIFLUCAN ) 150 MG tablet Take one tablet by mouth at the onset of symptoms and repeat in 5 days 08/09/24   Georgina Speaks, FNP  HYDROcodone -acetaminophen  (NORCO/VICODIN) 5-325 MG tablet Take 1 tablet by mouth 2 (two) times daily as needed. 07/15/22   [provider]  levocetirizine (XYZAL ) 5 MG tablet Take 1 tablet (5 mg total) by mouth 2 (two) times daily. 09/10/22   Jeneal Danita Macintosh, MD  levocetirizine (XYZAL ) 5 MG  tablet Take 1 tablet (5 mg total) by mouth 2 (two) times daily. 05/18/24   Jeneal Danita Macintosh, MD  montelukast  (SINGULAIR ) 10 MG tablet Take 1 tablet (10 mg total) by mouth at bedtime. 01/22/24   Jeneal Danita Macintosh, MD  phentermine  15 MG capsule Take 15 mg by mouth daily. 04/07/24   [provider]  SAVELLA TITRATION PACK 12.5 & 25 & 50 MG MISC Take 25 mg by mouth in the morning and at bedtime. 04/08/24   [provider]  valACYclovir  (VALTREX ) 1000 MG tablet Take 1 tablet twice a day x 3 days for cold sores outbreaks 05/17/24   Georgina Speaks, FNP    Physical Exam: Vitals:   09/15/24 1342 09/15/24 1345 09/15/24 1727 09/15/24 2034  BP:  (!) 159/130 (!) 132/96 (!) 132/90  Pulse:  100 67 80  Resp:  (!) 23 15 14   Temp:  ROLLEN)  97.4 F (36.3 C) 97.7 F (36.5 C) 98 F (36.7 C)  TempSrc:  Oral Oral Oral  SpO2:  100% 100% 100%  Weight: 99.8 kg     Height: 5' 4 (1.626 m)        Constitutional: NAD, calm  Eyes: PERTLA, lids and conjunctivae normal ENMT: Mucous membranes are moist. Posterior pharynx clear of any exudate or lesions.   Neck: supple, no masses  Respiratory: no wheezing, no crackles. No accessory muscle use.  Cardiovascular: S1 & S2 heard, regular rate and rhythm. No extremity edema.   Abdomen: Soft, distended, tender without guarding. Bowel sounds hypoactive.  Musculoskeletal: no clubbing / cyanosis. No joint deformity upper and lower extremities.   Skin: no significant rashes, lesions, ulcers. Warm, dry, well-perfused. Neurologic: CN 2-12 grossly intact. Moving all extremities. Alert and oriented.  Psychiatric: Pleasant. Cooperative.    Labs and Imaging on Admission: I have personally reviewed following labs and imaging studies  CBC: Recent Labs  Lab 09/15/24 1354  WBC 12.7*  HGB 14.4  HCT 45.6  MCV 82.5  PLT 347   Basic Metabolic Panel: Recent Labs  Lab 09/15/24 1354  NA 138  K 3.8  CL 100  CO2 25  GLUCOSE 122*  BUN 14   CREATININE 1.16*  CALCIUM 9.9   GFR: Estimated Creatinine Clearance: 76.9 mL/min (A) (by C-G formula based on SCr of 1.16 mg/dL (H)). Liver Function Tests: Recent Labs  Lab 09/15/24 1354  AST 17  ALT 14  ALKPHOS 91  BILITOT 0.7  PROT 8.1  ALBUMIN  4.2   Recent Labs  Lab 09/15/24 1354  LIPASE 21   No results for input(s): AMMONIA in the last 168 hours. Coagulation Profile: No results for input(s): INR, PROTIME in the last 168 hours. Cardiac Enzymes: No results for input(s): CKTOTAL, CKMB, CKMBINDEX, TROPONINI in the last 168 hours. BNP (last 3 results) No results for input(s): PROBNP in the last 8760 hours. HbA1C: No results for input(s): HGBA1C in the last 72 hours. CBG: No results for input(s): GLUCAP in the last 168 hours. Lipid Profile: No results for input(s): CHOL, HDL, LDLCALC, TRIG, CHOLHDL, LDLDIRECT in the last 72 hours. Thyroid  Function Tests: No results for input(s): TSH, T4TOTAL, FREET4, T3FREE, THYROIDAB in the last 72 hours. Anemia Panel: No results for input(s): VITAMINB12, FOLATE, FERRITIN, TIBC, IRON , RETICCTPCT in the last 72 hours. Urine analysis:    Component Value Date/Time   COLORURINE YELLOW 07/20/2019 1855   APPEARANCEUR CLEAR 07/20/2019 1855   LABSPEC 1.008 07/20/2019 1855   PHURINE 7.0 07/20/2019 1855   GLUCOSEU NEGATIVE 07/20/2019 1855   HGBUR SMALL (A) 07/20/2019 1855   BILIRUBINUR small (A) 07/06/2024 1613   BILIRUBINUR negative 07/22/2022 1728   KETONESUR trace (5) (A) 07/06/2024 1613   KETONESUR NEGATIVE 07/20/2019 1855   PROTEINUR Negative 07/22/2022 1728   PROTEINUR NEGATIVE 07/20/2019 1855   UROBILINOGEN 0.2 07/06/2024 1613   UROBILINOGEN 0.2 07/30/2018 1859   NITRITE Negative 07/06/2024 1613   NITRITE negative 07/22/2022 1728   NITRITE NEGATIVE 07/20/2019 1855   LEUKOCYTESUR Negative 07/06/2024 1613   LEUKOCYTESUR NEGATIVE 07/20/2019 1855   Sepsis  Labs: @LABRCNTIP (procalcitonin:4,lacticidven:4) )No results found for this or any previous visit (from the past 240 hours).   Radiological Exams on Admission: CT ABDOMEN PELVIS W CONTRAST Result Date: 09/15/2024 EXAM: CT ABDOMEN AND PELVIS WITH CONTRAST 09/15/2024 09:02:40 PM TECHNIQUE: CT of the abdomen and pelvis was performed with the administration of 75 mL of iohexol  (OMNIPAQUE ) 350 MG/ML injection. Multiplanar reformatted images  are provided for review. Automated exposure control, iterative reconstruction, and/or weight-based adjustment of the mA/kV was utilized to reduce the radiation dose to as low as reasonably achievable. COMPARISON: None available. CLINICAL HISTORY: evaluate intraabdominal pathology, we are giving pretreatment FINDINGS: LOWER CHEST: No acute abnormality. LIVER: The liver is unremarkable. GALLBLADDER AND BILE DUCTS: Gallbladder is unremarkable. No biliary ductal dilatation. SPLEEN: No acute abnormality. PANCREAS: No acute abnormality. ADRENAL GLANDS: No acute abnormality. KIDNEYS, URETERS AND BLADDER: No stones in the kidneys or ureters. No hydronephrosis. No perinephric or periureteral stranding. Urinary bladder is unremarkable. GI AND BOWEL: Stomach demonstrates no acute abnormality. Dilated proximal and mid small bowel loops. Mid to distal small bowel decompressed. Findings compatible with mid small bowel obstruction. Exact transition and cause not visualized. PERITONEUM AND RETROPERITONEUM: Trace perihepatic ascites. No free air. VASCULATURE: Aorta is normal in caliber. LYMPH NODES: No lymphadenopathy. REPRODUCTIVE ORGANS: Prior hysterectomy. No adnexal mass. BONES AND SOFT TISSUES: No acute osseous abnormality. No focal soft tissue abnormality. IMPRESSION: 1. Mid small bowel obstruction; exact transition and cause not visualized. 2. Trace perihepatic ascites. Electronically signed by: Franky Crease MD 09/15/2024 09:14 PM EST RP Workstation: [id number]   CT Angio Chest PE W  and/or Wo Contrast Result Date: 09/15/2024 EXAM: CTA CHEST 09/15/2024 09:02:40 PM TECHNIQUE: CTA of the chest was performed after the administration of 75 mL of iohexol  (OMNIPAQUE ) 350 MG/ML injection. Multiplanar reformatted images are provided for review. MIP images are provided for review. Automated exposure control, iterative reconstruction, and/or weight based adjustment of the mA/kV was utilized to reduce the radiation dose to as low as reasonably achievable. COMPARISON: 06/24/2019. CLINICAL HISTORY: evaluate for pe evaluate for pe FINDINGS: PULMONARY ARTERIES: Pulmonary arteries are adequately opacified for evaluation. No acute pulmonary embolus. Main pulmonary artery is normal in caliber. MEDIASTINUM: The heart and pericardium demonstrate no acute abnormality. There is no acute abnormality of the thoracic aorta. LYMPH NODES: No mediastinal, hilar or axillary lymphadenopathy. LUNGS AND PLEURA: Right base atelectasis. No focal consolidation or pulmonary edema. No evidence of pleural effusion or pneumothorax. UPPER ABDOMEN: Limited images of the upper abdomen are unremarkable. SOFT TISSUES AND BONES: No acute bone or soft tissue abnormality. IMPRESSION: 1. No pulmonary embolism. 2. No acute cardiopulmonary disease. Electronically signed by: Franky Crease MD 09/15/2024 09:10 PM EST RP Workstation: [id number]     Assessment/Plan   1. SBO  - Appreciate surgery assessment and recommendations  - Continue bowel rest, supportive care   2. Asthma  - Not in exacerbation   - Continue ICS-LABA and as-needed SABA    3. Hypertension  - Treat as-needed only for now    DVT prophylaxis: Lovenox   Code Status: Full  Level of Care: Level of care: Med-Surg Family Communication: Significant other at bedside   Disposition Plan:  Patient is from: Home  Anticipated d/c is to: Home  Anticipated d/c date is: 09/18/24 Patient currently: Pending return of bowel function  Consults called: Surgery  Admission  status: Inpatient     Evalene GORMAN Sprinkles, MD [facility]  09/15/2024, 10:38 PM

## 2024-09-16 ENCOUNTER — Encounter: Payer: Self-pay | Admitting: Oncology

## 2024-09-16 ENCOUNTER — Telehealth (HOSPITAL_COMMUNITY): Payer: Self-pay | Admitting: Pharmacy Technician

## 2024-09-16 ENCOUNTER — Other Ambulatory Visit (HOSPITAL_COMMUNITY): Payer: Self-pay

## 2024-09-16 DIAGNOSIS — K56609 Unspecified intestinal obstruction, unspecified as to partial versus complete obstruction: Secondary | ICD-10-CM | POA: Diagnosis not present

## 2024-09-16 LAB — URINALYSIS, ROUTINE W REFLEX MICROSCOPIC
Bilirubin Urine: NEGATIVE
Glucose, UA: NEGATIVE mg/dL
Hgb urine dipstick: NEGATIVE
Ketones, ur: NEGATIVE mg/dL
Leukocytes,Ua: NEGATIVE
Nitrite: NEGATIVE
Protein, ur: NEGATIVE mg/dL
Specific Gravity, Urine: 1.035 — ABNORMAL HIGH (ref 1.005–1.030)
pH: 6 (ref 5.0–8.0)

## 2024-09-16 LAB — CBC
HCT: 40.8 % (ref 36.0–46.0)
Hemoglobin: 12.9 g/dL (ref 12.0–15.0)
MCH: 26.1 pg (ref 26.0–34.0)
MCHC: 31.6 g/dL (ref 30.0–36.0)
MCV: 82.6 fL (ref 80.0–100.0)
Platelets: 328 K/uL (ref 150–400)
RBC: 4.94 MIL/uL (ref 3.87–5.11)
RDW: 14.1 % (ref 11.5–15.5)
WBC: 11.4 K/uL — ABNORMAL HIGH (ref 4.0–10.5)
nRBC: 0 % (ref 0.0–0.2)

## 2024-09-16 LAB — BASIC METABOLIC PANEL WITH GFR
Anion gap: 12 (ref 5–15)
BUN: 12 mg/dL (ref 6–20)
CO2: 26 mmol/L (ref 22–32)
Calcium: 9.3 mg/dL (ref 8.9–10.3)
Chloride: 103 mmol/L (ref 98–111)
Creatinine, Ser: 1.07 mg/dL — ABNORMAL HIGH (ref 0.44–1.00)
GFR, Estimated: >60 mL/min (ref >60)
Glucose, Bld: 103 mg/dL — ABNORMAL HIGH (ref 70–99)
Potassium: 4.3 mmol/L (ref 3.5–5.1)
Sodium: 141 mmol/L (ref 135–145)

## 2024-09-16 LAB — MAGNESIUM: Magnesium: 2.3 mg/dL (ref 1.7–2.4)

## 2024-09-16 LAB — HIV ANTIBODY (ROUTINE TESTING W REFLEX): HIV Screen 4th Generation wRfx: NONREACTIVE

## 2024-09-16 MED ORDER — DIPHENHYDRAMINE HCL 50 MG/ML IJ SOLN
12.5000 mg | Freq: Three times a day (TID) | INTRAMUSCULAR | Status: DC | PRN
  Administered 2024-09-16 – 2024-09-21 (×4): 12.5 mg via INTRAVENOUS
  Filled 2024-09-16 (×4): qty 1

## 2024-09-16 NOTE — Plan of Care (Signed)
  Problem: Education: Goal: Knowledge of General Education information will improve Description: Including pain rating scale, medication(s)/side effects and non-pharmacologic comfort measures Outcome: Progressing   Problem: Activity: Goal: Risk for activity intolerance will decrease Outcome: Progressing   Problem: Elimination: Goal: Will not experience complications related to urinary retention Outcome: Progressing   Problem: Skin Integrity: Goal: Risk for impaired skin integrity will decrease Outcome: Progressing

## 2024-09-16 NOTE — Progress Notes (Signed)
 Subjective: Less bloated today.  Still with some more focal pain in her LLQ, but overall abdominal pain is improved.  No nausea or vomiting.  No bowel function yet.  ROS: See above, otherwise other systems negative  Objective: Vital signs in last 24 hours: Temp:  [97.4 F (36.3 C)-98 F (36.7 C)] 97.9 F (36.6 C) (11/28 0527) Pulse Rate:  [61-100] 67 (11/28 0527) Resp:  [14-23] 16 (11/28 0029) BP: (125-168)/(90-130) 125/96 (11/28 0527) SpO2:  [99 %-100 %] 99 % (11/28 0527) Weight:  [99.8 kg] 99.8 kg (11/27 1342) Last BM Date : 09/13/24  Intake/Output from previous day: 11/27 0701 - 11/28 0700 In: 360 [I.V.:360] Out: -  Intake/Output this shift: No intake/output data recorded.  PE: Gen: NAD Abd: soft, less distended, mild bloating remains, +BS, mild lower abdominal tenderness  Lab Results:  Recent Labs    09/15/24 1354 09/16/24 0628  WBC 12.7* 11.4*  HGB 14.4 12.9  HCT 45.6 40.8  PLT 347 328   BMET Recent Labs    09/15/24 1354 09/16/24 0628  NA 138 141  K 3.8 4.3  CL 100 103  CO2 25 26  GLUCOSE 122* 103*  BUN 14 12  CREATININE 1.16* 1.07*  CALCIUM 9.9 9.3   PT/INR No results for input(s): LABPROT, INR in the last 72 hours. CMP     Component Value Date/Time   NA 141 09/16/2024 0628   NA 139 04/20/2024 1541   K 4.3 09/16/2024 0628   CL 103 09/16/2024 0628   CO2 26 09/16/2024 0628   GLUCOSE 103 (H) 09/16/2024 0628   BUN 12 09/16/2024 0628   BUN 12 04/20/2024 1541   CREATININE 1.07 (H) 09/16/2024 0628   CALCIUM 9.3 09/16/2024 0628   PROT 8.1 09/15/2024 1354   PROT 7.9 10/02/2022 0907   ALBUMIN  4.2 09/15/2024 1354   ALBUMIN  4.8 10/02/2022 0907   AST 17 09/15/2024 1354   ALT 14 09/15/2024 1354   ALKPHOS 91 09/15/2024 1354   BILITOT 0.7 09/15/2024 1354   BILITOT 0.4 10/02/2022 0907   GFRNONAA >60 09/16/2024 0628   GFRAA 85 12/14/2019 1052   Lipase     Component Value Date/Time   LIPASE 21 09/15/2024 1354        Studies/Results: CT ABDOMEN PELVIS W CONTRAST Result Date: 09/15/2024 EXAM: CT ABDOMEN AND PELVIS WITH CONTRAST 09/15/2024 09:02:40 PM TECHNIQUE: CT of the abdomen and pelvis was performed with the administration of 75 mL of iohexol  (OMNIPAQUE ) 350 MG/ML injection. Multiplanar reformatted images are provided for review. Automated exposure control, iterative reconstruction, and/or weight-based adjustment of the mA/kV was utilized to reduce the radiation dose to as low as reasonably achievable. COMPARISON: None available. CLINICAL HISTORY: evaluate intraabdominal pathology, we are giving pretreatment FINDINGS: LOWER CHEST: No acute abnormality. LIVER: The liver is unremarkable. GALLBLADDER AND BILE DUCTS: Gallbladder is unremarkable. No biliary ductal dilatation. SPLEEN: No acute abnormality. PANCREAS: No acute abnormality. ADRENAL GLANDS: No acute abnormality. KIDNEYS, URETERS AND BLADDER: No stones in the kidneys or ureters. No hydronephrosis. No perinephric or periureteral stranding. Urinary bladder is unremarkable. GI AND BOWEL: Stomach demonstrates no acute abnormality. Dilated proximal and mid small bowel loops. Mid to distal small bowel decompressed. Findings compatible with mid small bowel obstruction. Exact transition and cause not visualized. PERITONEUM AND RETROPERITONEUM: Trace perihepatic ascites. No free air. VASCULATURE: Aorta is normal in caliber. LYMPH NODES: No lymphadenopathy. REPRODUCTIVE ORGANS: Prior hysterectomy. No adnexal mass. BONES AND SOFT TISSUES: No acute osseous abnormality. No  focal soft tissue abnormality. IMPRESSION: 1. Mid small bowel obstruction; exact transition and cause not visualized. 2. Trace perihepatic ascites. Electronically signed by: Franky Crease MD 09/15/2024 09:14 PM EST RP Workstation: [id number]   CT Angio Chest PE W and/or Wo Contrast Result Date: 09/15/2024 EXAM: CTA CHEST 09/15/2024 09:02:40 PM TECHNIQUE: CTA of the chest was performed after the  administration of 75 mL of iohexol  (OMNIPAQUE ) 350 MG/ML injection. Multiplanar reformatted images are provided for review. MIP images are provided for review. Automated exposure control, iterative reconstruction, and/or weight based adjustment of the mA/kV was utilized to reduce the radiation dose to as low as reasonably achievable. COMPARISON: 06/24/2019. CLINICAL HISTORY: evaluate for pe evaluate for pe FINDINGS: PULMONARY ARTERIES: Pulmonary arteries are adequately opacified for evaluation. No acute pulmonary embolus. Main pulmonary artery is normal in caliber. MEDIASTINUM: The heart and pericardium demonstrate no acute abnormality. There is no acute abnormality of the thoracic aorta. LYMPH NODES: No mediastinal, hilar or axillary lymphadenopathy. LUNGS AND PLEURA: Right base atelectasis. No focal consolidation or pulmonary edema. No evidence of pleural effusion or pneumothorax. UPPER ABDOMEN: Limited images of the upper abdomen are unremarkable. SOFT TISSUES AND BONES: No acute bone or soft tissue abnormality. IMPRESSION: 1. No pulmonary embolism. 2. No acute cardiopulmonary disease. Electronically signed by: Franky Crease MD 09/15/2024 09:10 PM EST RP Workstation: [id number]    Anti-infectives: Anti-infectives (From admission, onward)    None        Assessment/Plan SBO in setting of complex PSH -will keep NPO today x few ice chips.  She is allergic to contrast with hives.  She did tolerate with the 4 hr prep last night -hold on NGT -cont bowel rest -check plain film in am.  If this is improving and clinically improving, will continue this management.  However, if she is not improving, then may consider prep for contrast and try the SBO protocol -mobilize   FEN - NPO x ice, IVFs per primary VTE - Lovenox  ID - none currently needed  I reviewed hospitalist notes, last 24 h vitals and pain scores, last 48 h intake and output, last 24 h labs and trends, and last 24 h imaging results.   LOS:  1 day    Burnard FORBES Banter , [facility] 09/16/2024, 9:06 AM Please see Amion for pager number during day hours 7:00am-4:30pm or 7:00am -11:30am on weekends

## 2024-09-16 NOTE — Progress Notes (Signed)
  Progress Note   Patient: Gwendolyn Nguyen FMW:[id number] DOB: Mar 14, 1986 DOA: 09/15/2024     1 DOS: the patient was seen and examined on 09/16/2024   Brief hospital course: HPI: Gwendolyn Nguyen is a 38 y.o. female with medical history significant for depression, fibromyalgia, hypertension, urticaria, reactive airways disease, history of abdominal hysterectomy with bilateral salpingo-oophorectomy, and laparoscopic lysis of adhesions in 2020 who presents with abdominal pain, nausea, and vomiting.   Patient reports developing generalized crampy abdominal discomfort yesterday shortly after eating.  She has continued to have abdominal discomfort as well as nausea and several episodes of vomiting.   ED Course: Upon arrival to the ED, patient is found to be afebrile and saturating well on room air with normal HR and elevated BP.  Labs are most notable for creatinine 1.16, WBC 12,700, normal LFTs and lipase, and D-dimer 1.75.  There is no PE or other acute finding on CTA chest.  CT of the abdomen and pelvis reveals SBO.   Surgery (Dr. Sebastian) was consulted by the ED physician and the patient was treated with Zofran , Reglan , and fentanyl .  Assessment and Plan:  38 year old female/PMH of depression, fibromyalgia, HTN, urticaria, reactive airways disease, history of abdominal hysterectomy with bilateral salpingo-oophorectomy and laparoscopy lysis of adhesions in 2020 presented with nausea vomiting and abdominal pain.  1. SBO/nausea/vomiting/abdominal pain - Appreciate surgery assessment and recommendations  - Continue bowel rest, n.p.o., IVF, supportive care    2. Asthma  - Not in exacerbation   - Continue ICS-LABA and as-needed SABA     3. Hypertension  - Treat as-needed only for now      Subjective: Complains some abdominal discomfort, she did not pass flatus or bowel movement  Physical Exam: Vitals:   09/16/24 0029 09/16/24 0140 09/16/24 0527 09/16/24 1104  BP: (!) 168/106 (!) 134/95 (!)  125/96 (!) 140/98  Pulse: 61  67 68  Resp: 16     Temp: 97.7 F (36.5 C)  97.9 F (36.6 C) 98 F (36.7 C)  TempSrc: Oral  Oral Oral  SpO2: 99%  99% 100%  Weight:      Height:       Constitutional: Alert, awake, calm, comfortable HEENT: Neck supple Respiratory: clear to auscultation bilaterally, no wheezing, no crackles. Normal respiratory effort. No accessory muscle use.  Cardiovascular: Regular rate and rhythm, no murmurs / rubs / gallops. No extremity edema. 2+ pedal pulses. No carotid bruits.  Abdomen: Mild diffuse tenderness, no rebound, BS sluggish  Musculoskeletal: no clubbing / cyanosis. No joint deformity upper and lower extremities. Good ROM, no contractures. Normal muscle tone.  Skin: no rashes, lesions, ulcers. No induration Neurologic: CN 2-12 grossly intact. Sensation intact, DTR normal. Strength 5/5 x all 4 extremities.  Psychiatric: Normal judgment and insight. Alert and oriented x 3. Normal mood.   Data Reviewed:  Reviewed  Family Communication: Husband was at bedside  Disposition: Status is: Inpatient Remains inpatient appropriate because: Ongoing recovery from small bowel obstruction  Planned Discharge Destination: Home    Time spent: 35 minutes  Author: Nena Rebel, MD 09/16/2024 2:37 PM  For on call review [email or web address].

## 2024-09-16 NOTE — Hospital Course (Addendum)
 38 y.o. female with medical history significant for depression, fibromyalgia, hypertension, urticaria, reactive airways disease, history of abdominal hysterectomy with bilateral salpingo-oophorectomy, and laparoscopic lysis of adhesions in 2020 who presented with abdominal pain, nausea, and vomiting.  CT of the abdomen and pelvis reveals SBO.   Unfortunately, left AMA very early morning of 12/4 despite attempts of staff to convince patient to stay.    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  Monitor output   Asthma  - Not in exacerbation   - Continue ICS-LABA and as-needed SABA     Hypertension -No history of hypertension and not taking any medications at home -May be related to stress due to small bowel obstruction -Treat as-needed only for now with hydralazine  and labetalol 

## 2024-09-16 NOTE — Telephone Encounter (Signed)
 Patient Product/process Development Scientist completed.    The patient is insured through [employer] [facility]. Patient has [employer], may use a copay card, and/or apply for patient assistance if available.    Ran test claim for Breo Ellipta  100-25 mcg and the current 30 day co-pay is $30.00.   This test claim was processed through [facility]- copay amounts may vary at other pharmacies due to pharmacy/plan contracts, or as the patient moves through the different stages of their insurance plan.     Gwendolyn Nguyen, CPHT Pharmacy Technician Patient Advocate Specialist Lead [facility] Patient Advocate Team Direct Number: [phone]  Fax: [phone]

## 2024-09-17 ENCOUNTER — Inpatient Hospital Stay (HOSPITAL_COMMUNITY)

## 2024-09-17 DIAGNOSIS — K56609 Unspecified intestinal obstruction, unspecified as to partial versus complete obstruction: Secondary | ICD-10-CM | POA: Diagnosis not present

## 2024-09-17 LAB — BASIC METABOLIC PANEL WITH GFR
Anion gap: 8 (ref 5–15)
BUN: 14 mg/dL (ref 6–20)
CO2: 29 mmol/L (ref 22–32)
Calcium: 8.9 mg/dL (ref 8.9–10.3)
Chloride: 100 mmol/L (ref 98–111)
Creatinine, Ser: 1.24 mg/dL — ABNORMAL HIGH (ref 0.44–1.00)
GFR, Estimated: 58 mL/min — ABNORMAL LOW (ref >60)
Glucose, Bld: 89 mg/dL (ref 70–99)
Potassium: 3.9 mmol/L (ref 3.5–5.1)
Sodium: 137 mmol/L (ref 135–145)

## 2024-09-17 LAB — CBC
HCT: 41.2 % (ref 36.0–46.0)
Hemoglobin: 12.9 g/dL (ref 12.0–15.0)
MCH: 26.2 pg (ref 26.0–34.0)
MCHC: 31.3 g/dL (ref 30.0–36.0)
MCV: 83.7 fL (ref 80.0–100.0)
Platelets: 300 K/uL (ref 150–400)
RBC: 4.92 MIL/uL (ref 3.87–5.11)
RDW: 14.1 % (ref 11.5–15.5)
WBC: 9.2 K/uL (ref 4.0–10.5)
nRBC: 0 % (ref 0.0–0.2)

## 2024-09-17 MED ORDER — DIATRIZOATE MEGLUMINE & SODIUM 66-10 % PO SOLN
90.0000 mL | Freq: Once | ORAL | Status: AC
  Administered 2024-09-17: 90 mL via ORAL
  Filled 2024-09-17: qty 90

## 2024-09-17 NOTE — Progress Notes (Signed)
 Provider notified due to increase in lower ABD pain. On initial assessment there was distention on the LLQ area, pain, hypo bowl sounds and tenderness. Once the pt complained of increase in pain I reassessed and it seemed to be distended about the same as before just different pain locations and she was tearful. I had given her oral pain meds and after speaking with the provider she instructed that I give the PRN IV pain meds as well. I checked back in with the Pt after giving the meds and she stated the pain has eased up 6/10 on the pain scale and she was going to rest. Provider made aware.

## 2024-09-17 NOTE — Progress Notes (Signed)
  Progress Note   Patient: Gwendolyn Nguyen FMW:[id number] DOB: [birth year] DOA: 09/15/2024     2 DOS: the patient was seen and examined on 09/17/2024   Brief hospital course: HPI: [name] is a 38 y.o. female with medical history significant for depression, fibromyalgia, hypertension, urticaria, reactive airways disease, history of abdominal hysterectomy with bilateral salpingo-oophorectomy, and laparoscopic lysis of adhesions in 2020 who presents with abdominal pain, nausea, and vomiting.   Patient reports developing generalized crampy abdominal discomfort yesterday shortly after eating.  She has continued to have abdominal discomfort as well as nausea and several episodes of vomiting.   ED Course: Upon arrival to the ED, patient is found to be afebrile and saturating well on room air with normal HR and elevated BP.  Labs are most notable for creatinine 1.16, WBC 12,700, normal LFTs and lipase, and D-dimer 1.75.  There is no PE or other acute finding on CTA chest.  CT of the abdomen and pelvis reveals SBO.   Surgery (Dr. Sebastian) was consulted by the ED physician and the patient was treated with Zofran , Reglan , and fentanyl .  11/29: Abdominal x-ray showed small bowel obstruction  Assessment and Plan:  38 year old female/PMH of depression, fibromyalgia, HTN, urticaria, reactive airways disease, history of abdominal hysterectomy with bilateral salpingo-oophorectomy and laparoscopy lysis of adhesions in 2020 presented with nausea vomiting and abdominal pain.   1. SBO/nausea/vomiting/abdominal pain - Appreciate surgery assessment and recommendations  - Continue bowel rest, n.p.o., IVF, supportive care - Abdominal x-ray this morning showed persistent small bowel obstruction    2. Asthma  - Not in exacerbation   - Continue ICS-LABA and as-needed SABA     3. Hypertension  - Treat as-needed only for now       Subjective: Still complains of abdominal discomfort but she stated that  she passed flatus today  Physical Exam: Vitals:   09/16/24 2033 09/17/24 0420 09/17/24 0825 09/17/24 1015  BP: (!) 153/101 (!) 141/93  (!) 140/109  Pulse: 75 68  81  Resp: 18 16  17   Temp: 97.9 F (36.6 C) 98.3 F (36.8 C)  98 F (36.7 C)  TempSrc: Oral Oral  Oral  SpO2: 99% 96% 96% 100%  Weight:      Height:       Constitutional: Alert, awake, calm, comfortable HEENT: Neck supple Respiratory: clear to auscultation bilaterally, no wheezing, no crackles. Normal respiratory effort. No accessory muscle use.  Cardiovascular: Regular rate and rhythm, no murmurs / rubs / gallops. No extremity edema. 2+ pedal pulses. No carotid bruits.  Abdomen: Sluggish bowel sounds, mild tenderness Musculoskeletal: no clubbing / cyanosis. No joint deformity upper and lower extremities. Good ROM, no contractures. Normal muscle tone.  Skin: no rashes, lesions, ulcers. No induration Neurologic: CN 2-12 grossly intact. Sensation intact, DTR normal. Strength 5/5 x all 4 extremities.  Psychiatric: Normal judgment and insight. Alert and oriented x 3. Normal mood.   Data Reviewed:  Reviewed  Family Communication: Husband at bedside  Disposition: Status is: Inpatient Remains inpatient appropriate because: Ongoing recovery from his small bowel obstruction requiring IV fluid and further workup  Planned Discharge Destination: Home    Time spent: 35 minutes  Author: Nena Rebel, MD 09/17/2024 5:02 PM  For on call review [email or web address].

## 2024-09-17 NOTE — Progress Notes (Signed)
   09/17/24 1647  Spiritual Encounters  Type of Visit Initial  Care provided to: Patient;Family  Conversation partners present during encounter Nurse  Reason for visit Advance directives  OnCall Visit No   This chaplain visited the patient and family (mother and father) and educated them on the need for an Proofreader. The patient agreed that the document was necessary and said she would get it filled out with her parents. She understands that there are no notaries on staff on the weekend but will have her nurse page a chaplain on Monday to get the document finalized.

## 2024-09-17 NOTE — Progress Notes (Signed)
 X-ray at bedside.

## 2024-09-17 NOTE — Plan of Care (Signed)

## 2024-09-17 NOTE — Progress Notes (Signed)
 Subjective: Feeling better overall, but still with some pain in LLQ c/w findings on plain film still.  Passing some flatus, but no other bowel function yet.    ROS: See above, otherwise other systems negative  Objective: Vital signs in last 24 hours: Temp:  [97.9 F (36.6 C)-98.5 F (36.9 C)] 98.3 F (36.8 C) (11/29 0420) Pulse Rate:  [68-75] 68 (11/29 0420) Resp:  [16-18] 16 (11/29 0420) BP: (140-153)/(93-101) 141/93 (11/29 0420) SpO2:  [96 %-100 %] 96 % (11/29 0825) Last BM Date : 09/13/24  Intake/Output from previous day: No intake/output data recorded. Intake/Output this shift: No intake/output data recorded.  PE: Gen: NAD Abd: soft, mild bloating, mild lower abdominal tenderness, specifically on left side  Lab Results:  Recent Labs    09/16/24 0628 09/17/24 0111  WBC 11.4* 9.2  HGB 12.9 12.9  HCT 40.8 41.2  PLT 328 300   BMET Recent Labs    09/16/24 0628 09/17/24 0111  NA 141 137  K 4.3 3.9  CL 103 100  CO2 26 29  GLUCOSE 103* 89  BUN 12 14  CREATININE 1.07* 1.24*  CALCIUM 9.3 8.9   PT/INR No results for input(s): LABPROT, INR in the last 72 hours. CMP     Component Value Date/Time   NA 137 09/17/2024 0111   NA 139 04/20/2024 1541   K 3.9 09/17/2024 0111   CL 100 09/17/2024 0111   CO2 29 09/17/2024 0111   GLUCOSE 89 09/17/2024 0111   BUN 14 09/17/2024 0111   BUN 12 04/20/2024 1541   CREATININE 1.24 (H) 09/17/2024 0111   CALCIUM 8.9 09/17/2024 0111   PROT 8.1 09/15/2024 1354   PROT 7.9 10/02/2022 0907   ALBUMIN  4.2 09/15/2024 1354   ALBUMIN  4.8 10/02/2022 0907   AST 17 09/15/2024 1354   ALT 14 09/15/2024 1354   ALKPHOS 91 09/15/2024 1354   BILITOT 0.7 09/15/2024 1354   BILITOT 0.4 10/02/2022 0907   GFRNONAA 58 (L) 09/17/2024 0111   GFRAA 85 12/14/2019 1052   Lipase     Component Value Date/Time   LIPASE 21 09/15/2024 1354       Studies/Results: DG Abd Portable 1V Result Date: 09/17/2024 EXAM: 1 VIEW XRAY OF THE  ABDOMEN 09/17/2024 04:33:00 AM COMPARISON: CT with iv contrast 09/15/2024. CLINICAL HISTORY: 881154 SBO (small bowel obstruction) (HCC) 881154 SBO (small bowel obstruction) (HCC) FINDINGS: BOWEL: Dilated small bowel segments in the left hemiabdomen measure up to 4.3 cm with minimal if any change. Colonic gas is still seen at least as far as the sigmoid colon, with very little small bowel aeration on the right. SOFT TISSUES: No opaque urinary calculi. There are postsurgical changes in the left lower quadrant and overlying the sacrum. BONES: No acute osseous abnormality. IMPRESSION: 1. Findings consistent with small bowel obstruction, with dilated small bowel segments in the left hemiabdomen measuring up to 4.3 cm and minimal to no interval change. 2. Colonic gas is present to at least the sigmoid colon, with very little small bowel aeration on the right. Electronically signed by: Francis Quam MD 09/17/2024 05:45 AM EST RP Workstation: [id number]   CT ABDOMEN PELVIS W CONTRAST Result Date: 09/15/2024 EXAM: CT ABDOMEN AND PELVIS WITH CONTRAST 09/15/2024 09:02:40 PM TECHNIQUE: CT of the abdomen and pelvis was performed with the administration of 75 mL of iohexol  (OMNIPAQUE ) 350 MG/ML injection. Multiplanar reformatted images are provided for review. Automated exposure control, iterative reconstruction, and/or weight-based adjustment of the mA/kV was  utilized to reduce the radiation dose to as low as reasonably achievable. COMPARISON: None available. CLINICAL HISTORY: evaluate intraabdominal pathology, we are giving pretreatment FINDINGS: LOWER CHEST: No acute abnormality. LIVER: The liver is unremarkable. GALLBLADDER AND BILE DUCTS: Gallbladder is unremarkable. No biliary ductal dilatation. SPLEEN: No acute abnormality. PANCREAS: No acute abnormality. ADRENAL GLANDS: No acute abnormality. KIDNEYS, URETERS AND BLADDER: No stones in the kidneys or ureters. No hydronephrosis. No perinephric or periureteral stranding.  Urinary bladder is unremarkable. GI AND BOWEL: Stomach demonstrates no acute abnormality. Dilated proximal and mid small bowel loops. Mid to distal small bowel decompressed. Findings compatible with mid small bowel obstruction. Exact transition and cause not visualized. PERITONEUM AND RETROPERITONEUM: Trace perihepatic ascites. No free air. VASCULATURE: Aorta is normal in caliber. LYMPH NODES: No lymphadenopathy. REPRODUCTIVE ORGANS: Prior hysterectomy. No adnexal mass. BONES AND SOFT TISSUES: No acute osseous abnormality. No focal soft tissue abnormality. IMPRESSION: 1. Mid small bowel obstruction; exact transition and cause not visualized. 2. Trace perihepatic ascites. Electronically signed by: Franky Crease MD 09/15/2024 09:14 PM EST RP Workstation: [id number]   CT Angio Chest PE W and/or Wo Contrast Result Date: 09/15/2024 EXAM: CTA CHEST 09/15/2024 09:02:40 PM TECHNIQUE: CTA of the chest was performed after the administration of 75 mL of iohexol  (OMNIPAQUE ) 350 MG/ML injection. Multiplanar reformatted images are provided for review. MIP images are provided for review. Automated exposure control, iterative reconstruction, and/or weight based adjustment of the mA/kV was utilized to reduce the radiation dose to as low as reasonably achievable. COMPARISON: 06/24/2019. CLINICAL HISTORY: evaluate for pe evaluate for pe FINDINGS: PULMONARY ARTERIES: Pulmonary arteries are adequately opacified for evaluation. No acute pulmonary embolus. Main pulmonary artery is normal in caliber. MEDIASTINUM: The heart and pericardium demonstrate no acute abnormality. There is no acute abnormality of the thoracic aorta. LYMPH NODES: No mediastinal, hilar or axillary lymphadenopathy. LUNGS AND PLEURA: Right base atelectasis. No focal consolidation or pulmonary edema. No evidence of pleural effusion or pneumothorax. UPPER ABDOMEN: Limited images of the upper abdomen are unremarkable. SOFT TISSUES AND BONES: No acute bone or soft tissue  abnormality. IMPRESSION: 1. No pulmonary embolism. 2. No acute cardiopulmonary disease. Electronically signed by: Franky Crease MD 09/15/2024 09:10 PM EST RP Workstation: [id number]    Anti-infectives: Anti-infectives (From admission, onward)    None        Assessment/Plan SBO in setting of complex PSH -d/w radiology.  There is no contraindication with IV dye contrast allergy  to not being able to tolerate oral contrast.  No prep is needed. -will start SBO protocol without NGT and follow contrast. -cont bowel rest -mobilize   FEN - NPO x ice, IVFs per primary VTE - Lovenox  ID - none currently needed  I reviewed hospitalist notes, last 24 h vitals and pain scores, last 48 h intake and output, last 24 h labs and trends, and last 24 h imaging results.   LOS: 2 days    Burnard FORBES Banter , [facility] 09/17/2024, 8:40 AM Please see Amion for pager number during day hours 7:00am-4:30pm or 7:00am -11:30am on weekends

## 2024-09-18 DIAGNOSIS — K56609 Unspecified intestinal obstruction, unspecified as to partial versus complete obstruction: Secondary | ICD-10-CM | POA: Diagnosis not present

## 2024-09-18 LAB — BASIC METABOLIC PANEL WITH GFR
Anion gap: 12 (ref 5–15)
BUN: 12 mg/dL (ref 6–20)
CO2: 28 mmol/L (ref 22–32)
Calcium: 9.1 mg/dL (ref 8.9–10.3)
Chloride: 97 mmol/L — ABNORMAL LOW (ref 98–111)
Creatinine, Ser: 1.29 mg/dL — ABNORMAL HIGH (ref 0.44–1.00)
GFR, Estimated: 55 mL/min — ABNORMAL LOW (ref >60)
Glucose, Bld: 107 mg/dL — ABNORMAL HIGH (ref 70–99)
Potassium: 4.1 mmol/L (ref 3.5–5.1)
Sodium: 137 mmol/L (ref 135–145)

## 2024-09-18 LAB — CBC
HCT: 41.7 % (ref 36.0–46.0)
Hemoglobin: 13 g/dL (ref 12.0–15.0)
MCH: 26.1 pg (ref 26.0–34.0)
MCHC: 31.2 g/dL (ref 30.0–36.0)
MCV: 83.6 fL (ref 80.0–100.0)
Platelets: 298 K/uL (ref 150–400)
RBC: 4.99 MIL/uL (ref 3.87–5.11)
RDW: 13.9 % (ref 11.5–15.5)
WBC: 9.5 K/uL (ref 4.0–10.5)
nRBC: 0 % (ref 0.0–0.2)

## 2024-09-18 LAB — MAGNESIUM: Magnesium: 2.2 mg/dL (ref 1.7–2.4)

## 2024-09-18 MED ORDER — ALUM & MAG HYDROXIDE-SIMETH 200-200-20 MG/5ML PO SUSP: 30.0000 mL | Freq: Four times a day (QID) | ORAL | Status: DC | PRN

## 2024-09-18 MED ORDER — ACETAMINOPHEN 500 MG PO TABS
1000.0000 mg | ORAL_TABLET | Freq: Four times a day (QID) | ORAL | Status: DC
  Administered 2024-09-18 (×3): 1000 mg via ORAL
  Filled 2024-09-18 (×3): qty 2

## 2024-09-18 MED ORDER — METHOCARBAMOL 500 MG PO TABS
500.0000 mg | ORAL_TABLET | Freq: Three times a day (TID) | ORAL | Status: DC | PRN
  Administered 2024-09-18: 500 mg via ORAL
  Filled 2024-09-18: qty 1

## 2024-09-18 MED ORDER — HYDRALAZINE HCL 20 MG/ML IJ SOLN: 10.0000 mg | Freq: Four times a day (QID) | INTRAMUSCULAR | Status: DC | PRN

## 2024-09-18 MED ORDER — METOCLOPRAMIDE HCL 5 MG/ML IJ SOLN
10.0000 mg | Freq: Three times a day (TID) | INTRAMUSCULAR | Status: DC
  Administered 2024-09-18 – 2024-09-20 (×7): 10 mg via INTRAVENOUS
  Filled 2024-09-18 (×7): qty 2

## 2024-09-18 MED ORDER — PANTOPRAZOLE SODIUM 40 MG IV SOLR
40.0000 mg | Freq: Two times a day (BID) | INTRAVENOUS | Status: AC
  Administered 2024-09-18 – 2024-09-19 (×4): 40 mg via INTRAVENOUS
  Filled 2024-09-18 (×4): qty 10

## 2024-09-18 NOTE — Plan of Care (Signed)

## 2024-09-18 NOTE — Progress Notes (Signed)
 Subjective: Doesn't feel well this am as she started her period and is having a lot of cramps.  + BM, no nausea  ROS: See above, otherwise other systems negative  Objective: Vital signs in last 24 hours: Temp:  [98 F (36.7 C)-98.5 F (36.9 C)] 98.4 F (36.9 C) (11/30 0615) Pulse Rate:  [67-81] 68 (11/30 0615) Resp:  [16-18] 17 (11/30 0615) BP: (140-183)/(91-124) 165/115 (11/30 0615) SpO2:  [96 %-100 %] 99 % (11/30 0615) Last BM Date : 09/13/24  Intake/Output from previous day: No intake/output data recorded. Intake/Output this shift: No intake/output data recorded.  PE: Gen: NAD Abd: soft, lower abdominal tenderness secondary to cramps, ND  Lab Results:  Recent Labs    09/17/24 0111 09/18/24 0056  WBC 9.2 9.5  HGB 12.9 13.0  HCT 41.2 41.7  PLT 300 298   BMET Recent Labs    09/17/24 0111 09/18/24 0056  NA 137 137  K 3.9 4.1  CL 100 97*  CO2 29 28  GLUCOSE 89 107*  BUN 14 12  CREATININE 1.24* 1.29*  CALCIUM 8.9 9.1   PT/INR No results for input(s): LABPROT, INR in the last 72 hours. CMP     Component Value Date/Time   NA 137 09/18/2024 0056   NA 139 04/20/2024 1541   K 4.1 09/18/2024 0056   CL 97 (L) 09/18/2024 0056   CO2 28 09/18/2024 0056   GLUCOSE 107 (H) 09/18/2024 0056   BUN 12 09/18/2024 0056   BUN 12 04/20/2024 1541   CREATININE 1.29 (H) 09/18/2024 0056   CALCIUM 9.1 09/18/2024 0056   PROT 8.1 09/15/2024 1354   PROT 7.9 10/02/2022 0907   ALBUMIN  4.2 09/15/2024 1354   ALBUMIN  4.8 10/02/2022 0907   AST 17 09/15/2024 1354   ALT 14 09/15/2024 1354   ALKPHOS 91 09/15/2024 1354   BILITOT 0.7 09/15/2024 1354   BILITOT 0.4 10/02/2022 0907   GFRNONAA 55 (L) 09/18/2024 0056   GFRAA 85 12/14/2019 1052   Lipase     Component Value Date/Time   LIPASE 21 09/15/2024 1354       Studies/Results: DG Abd Portable 1V-Small Bowel Obstruction Protocol-initial, 8 hr delay Result Date: 09/17/2024 EXAM: 1 VIEW XRAY OF THE ABDOMEN  09/17/2024 08:06:00 PM COMPARISON: 09/17/2024 CLINICAL HISTORY: FINDINGS: BOWEL: Persistent dilation of the small bowel. Enteric contrast is present throughout the colon. SOFT TISSUES: Surgical clips and enteric suture lines in left lower quadrant. Postsurgical changes in left lower quadrant. No opaque urinary calculi. BONES: No acute osseous abnormality. IMPRESSION: 1. Persistent evaluation of the small bowel with passage of enteric contrast into the colon. Findings are compatible with ileus. Electronically signed by: Norman Gatlin MD 09/17/2024 08:25 PM EST RP Workstation: [id number]   DG Abd Portable 1V Result Date: 09/17/2024 EXAM: 1 VIEW XRAY OF THE ABDOMEN 09/17/2024 04:33:00 AM COMPARISON: CT with iv contrast 09/15/2024. CLINICAL HISTORY: 881154 SBO (small bowel obstruction) (HCC) 881154 SBO (small bowel obstruction) (HCC) FINDINGS: BOWEL: Dilated small bowel segments in the left hemiabdomen measure up to 4.3 cm with minimal if any change. Colonic gas is still seen at least as far as the sigmoid colon, with very little small bowel aeration on the right. SOFT TISSUES: No opaque urinary calculi. There are postsurgical changes in the left lower quadrant and overlying the sacrum. BONES: No acute osseous abnormality. IMPRESSION: 1. Findings consistent with small bowel obstruction, with dilated small bowel segments in the left hemiabdomen measuring up to 4.3 cm and  minimal to no interval change. 2. Colonic gas is present to at least the sigmoid colon, with very little small bowel aeration on the right. Electronically signed by: Francis Quam MD 09/17/2024 05:45 AM EST RP Workstation: [id number]    Anti-infectives: Anti-infectives (From admission, onward)    None        Assessment/Plan SBO in setting of complex PSH -x-ray reviewed with contrast in her colon -will adv to CLD  -add robaxin  and scheduled tylenol  to help with menstrual cramps -try to minimize narcotics to help with bowel  motility  FEN - CLD, IVFs per primary VTE - Lovenox  ID - none currently needed  I reviewed hospitalist notes, last 24 h vitals and pain scores, last 48 h intake and output, last 24 h labs and trends, and last 24 h imaging results.   LOS: 3 days    Burnard FORBES Banter , [facility] 09/18/2024, 8:17 AM Please see Amion for pager number during day hours 7:00am-4:30pm or 7:00am -11:30am on weekends

## 2024-09-18 NOTE — Progress Notes (Signed)
 BP still elevated, made day shift nurse aware and she was going to contact the provider.

## 2024-09-18 NOTE — Progress Notes (Signed)
 Progress Note   Patient: Gwendolyn Nguyen FMW:[id number] DOB: 05-27-1986 DOA: 09/15/2024     3 DOS: the patient was seen and examined on 09/18/2024   Brief hospital course: HPI: Gwendolyn Nguyen is a 38 y.o. female with medical history significant for depression, fibromyalgia, hypertension, urticaria, reactive airways disease, history of abdominal hysterectomy with bilateral salpingo-oophorectomy, and laparoscopic lysis of adhesions in 2020 who presents with abdominal pain, nausea, and vomiting.   Patient reports developing generalized crampy abdominal discomfort yesterday shortly after eating.  She has continued to have abdominal discomfort as well as nausea and several episodes of vomiting.   ED Course: Upon arrival to the ED, patient is found to be afebrile and saturating well on room air with normal HR and elevated BP.  Labs are most notable for creatinine 1.16, WBC 12,700, normal LFTs and lipase, and D-dimer 1.75.  There is no PE or other acute finding on CTA chest.  CT of the abdomen and pelvis reveals SBO.   Surgery (Dr. Sebastian) was consulted by the ED physician and the patient was treated with Zofran , Reglan , and fentanyl .  11/29: Abdominal x-ray showed small bowel obstruction 11/29: Bowel movement one-time loose 11/30: Clear liquid diet  Assessment and Plan:  38 year old female/PMH of depression, fibromyalgia, HTN, urticaria, reactive airways disease, history of abdominal hysterectomy with bilateral salpingo-oophorectomy and laparoscopy lysis of adhesions in 2020 presented with nausea vomiting and abdominal pain.   1. SBO/nausea/vomiting/abdominal pain - Appreciate surgery assessment and recommendations - Patient had 1 loose bowel movement 11/29 - Complaints of bloating, retching  and reflux symptoms. - Started on clear liquid diet, continue IVF, supportive care - Add Maalox, Protonix  and Reglan  for prokinetic effect - 11/29: Abdominal x-ray this morning showed persistent small  bowel obstruction     2. Asthma  - Not in exacerbation   - Continue ICS-LABA and as-needed SABA     3. Hypertension -No history of hypertension and not taking any medications at home -May be related to stress due to small bowel obstruction -Treat as-needed only for now with hydralazine and labetalol      Subjective: Complains of bloating some retching and reflux symptoms.  Physical Exam: Vitals:   09/18/24 0456 09/18/24 0615 09/18/24 0817 09/18/24 1019  BP: (!) 183/124 (!) 165/115 (!) 145/111 (!) 182/122  Pulse: 72 68 92 69  Resp: 18 17 17    Temp: 98.5 F (36.9 C) 98.4 F (36.9 C) 98.6 F (37 C)   TempSrc: Oral Oral Oral   SpO2: 99% 99% 97% 97%  Weight:      Height:       Constitutional: Alert, awake, calm, comfortable HEENT: Neck supple Respiratory: clear to auscultation bilaterally, no wheezing, no crackles. Normal respiratory effort. No accessory muscle use.  Cardiovascular: Regular rate and rhythm, no murmurs / rubs / gallops. No extremity edema. 2+ pedal pulses. No carotid bruits.  Abdomen: Bowel sounds are present but sluggish Musculoskeletal: no clubbing / cyanosis. No joint deformity upper and lower extremities. Good ROM, no contractures. Normal muscle tone.  Skin: no rashes, lesions, ulcers. No induration Neurologic: CN 2-12 grossly intact. Sensation intact, DTR normal. Strength 5/5 x all 4 extremities.  Psychiatric: Normal judgment and insight. Alert and oriented x 3. Normal mood.   Data Reviewed:  Reviewed  Family Communication: Husband was at bedside  Disposition: Status is: Inpatient Remains inpatient appropriate because: Ongoing recovery from small bowel obstruction  Planned Discharge Destination: Home    Time spent: 35 minutes  Author: Nena Rebel, MD 09/18/2024  11:30 AM  For on call review [email or web address].

## 2024-09-19 ENCOUNTER — Inpatient Hospital Stay (HOSPITAL_COMMUNITY)

## 2024-09-19 DIAGNOSIS — K56609 Unspecified intestinal obstruction, unspecified as to partial versus complete obstruction: Secondary | ICD-10-CM | POA: Diagnosis not present

## 2024-09-19 LAB — BASIC METABOLIC PANEL WITH GFR
Anion gap: 10 (ref 5–15)
BUN: 12 mg/dL (ref 6–20)
CO2: 30 mmol/L (ref 22–32)
Calcium: 9.7 mg/dL (ref 8.9–10.3)
Chloride: 96 mmol/L — ABNORMAL LOW (ref 98–111)
Creatinine, Ser: 1.15 mg/dL — ABNORMAL HIGH (ref 0.44–1.00)
GFR, Estimated: >60 mL/min (ref >60)
Glucose, Bld: 115 mg/dL — ABNORMAL HIGH (ref 70–99)
Potassium: 4 mmol/L (ref 3.5–5.1)
Sodium: 136 mmol/L (ref 135–145)

## 2024-09-19 LAB — CBC
HCT: 47 % — ABNORMAL HIGH (ref 36.0–46.0)
Hemoglobin: 14.9 g/dL (ref 12.0–15.0)
MCH: 26.2 pg (ref 26.0–34.0)
MCHC: 31.7 g/dL (ref 30.0–36.0)
MCV: 82.7 fL (ref 80.0–100.0)
Platelets: 323 K/uL (ref 150–400)
RBC: 5.68 MIL/uL — ABNORMAL HIGH (ref 3.87–5.11)
RDW: 14 % (ref 11.5–15.5)
WBC: 7.3 K/uL (ref 4.0–10.5)
nRBC: 0 % (ref 0.0–0.2)

## 2024-09-19 MED ORDER — HYDROMORPHONE HCL 1 MG/ML IJ SOLN
1.0000 mg | INTRAMUSCULAR | Status: DC | PRN
  Administered 2024-09-19 – 2024-09-20 (×4): 1 mg via INTRAVENOUS
  Filled 2024-09-19 (×4): qty 1

## 2024-09-19 MED ORDER — ACETAMINOPHEN 10 MG/ML IV SOLN
1000.0000 mg | Freq: Four times a day (QID) | INTRAVENOUS | Status: DC
  Administered 2024-09-19 – 2024-09-20 (×3): 1000 mg via INTRAVENOUS
  Filled 2024-09-19 (×3): qty 100

## 2024-09-19 MED ORDER — HYDROMORPHONE HCL 1 MG/ML IJ SOLN
0.5000 mg | Freq: Once | INTRAMUSCULAR | Status: AC
  Administered 2024-09-19: 0.5 mg via INTRAVENOUS
  Filled 2024-09-19: qty 0.5

## 2024-09-19 MED ORDER — MORPHINE SULFATE (PF) 2 MG/ML IV SOLN
1.0000 mg | Freq: Once | INTRAVENOUS | Status: AC
  Administered 2024-09-19: 1 mg via INTRAVENOUS
  Filled 2024-09-19: qty 1

## 2024-09-19 MED ORDER — SODIUM CHLORIDE 0.9 % IV SOLN: INTRAVENOUS | Status: AC

## 2024-09-19 NOTE — Progress Notes (Signed)
 VAST consult received to obtain IV access. Upon arrival at bedside spoke with patient who reported she is supposed to be having surgery and does not know how long she will be in hospital.  Assessed bilateral upper arms with ultrasound; right arm without appropriate cephalic or basilic vessels, brachial vessels deep; left arm without appropriate cephalic or basilic vessels. Attempted long midline placement using Seldinger technique, but was unsuccessful threading catheter fully into vein.  Assessment of lower bilateral arms with ultrasound revealed minimal to no appropriate vessels for cannulation.  Have asked second VAST RN to come assess vasculature, but suspect that PICC might be needed for further access.

## 2024-09-19 NOTE — Plan of Care (Signed)

## 2024-09-19 NOTE — Progress Notes (Signed)
 PROGRESS NOTE    Gwendolyn Nguyen  FMW:[id number] DOB: 1986-03-23 DOA: 09/15/2024 PCP: Jerome Heron Ruth, PA-C   Brief Narrative:   38 y.o. female with medical history significant for depression, fibromyalgia, hypertension, urticaria, reactive airways disease, history of abdominal hysterectomy with bilateral salpingo-oophorectomy, and laparoscopic lysis of adhesions in 2020 who presents with abdominal pain, nausea, and vomiting.  CT of the abdomen and pelvis reveals SBO.    Surgery consulted May need exp lap if symptoms not improving.  Assessment & Plan:  Principal Problem:   SBO (small bowel obstruction) (HCC) Active Problems:   Essential hypertension   Chronic asthma without complication    38 year old female/PMH of depression, fibromyalgia, HTN, urticaria, reactive airways disease, history of abdominal hysterectomy with bilateral salpingo-oophorectomy and laparoscopy lysis of adhesions in 2020 presented with nausea vomiting and abdominal pain.   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   -will place NGT if further episodes of vomiting -Repeat KUB done, report is pending.   Asthma  - Not in exacerbation   - Continue ICS-LABA and as-needed SABA     Hypertension -No history of hypertension and not taking any medications at home -May be related to stress due to small bowel obstruction -Treat as-needed only for now with hydralazine and labetalol   Disposition: Home  DVT prophylaxis: enoxaparin  (LOVENOX ) injection 40 mg Start: 09/16/24 1000     Code  Status: Full Code Family Communication:  Family at bedside Status is: Inpatient Remains inpatient appropriate because: SBO    Subjective:  Complaining of abdominal discomfort. Had an episode of vomiting this am.  She has not had a Bowel movement or passed flatus yet.   Examination:  General exam: Appears to be in mild distress Respiratory system: Clear to auscultation. Respiratory effort normal. Cardiovascular system: S1 & S2 heard, RRR. No JVD, murmurs, rubs, gallops or clicks. No pedal edema. Gastrointestinal system: Abdomen is nondistended, soft and nontender. No organomegaly or masses felt. Absent bowel sounds Central nervous system: Alert and oriented. No focal neurological deficits. Extremities: Symmetric 5 x 5 power. Skin: No rashes, lesions or ulcers Psychiatry: Judgement and insight appear normal. Mood & affect appropriate.      Diet Orders (From admission, onward)     Start     Ordered   09/18/24 0800  Diet clear liquid Room service appropriate? Yes; Fluid consistency: Thin  Diet effective now       Question Answer Comment  Room service appropriate? Yes   Fluid consistency: Thin      09/18/24 0759            Objective: Vitals:   09/18/24 2206 09/19/24 0609 09/19/24 0755 09/19/24 1042  BP: (!) 154/89 (!) 155/94  (!) 136/106  Pulse: 73 78  90  Resp: 16 16  17   Temp: 98.1 F (36.7 C) 98.4 F (36.9 C)  98 F (36.7 C)  TempSrc:    Oral  SpO2: 100% 100% 97% 100%  Weight:      Height:        Intake/Output Summary (Last 24 hours) at 09/19/2024 1325 Last data filed at 09/18/2024 1600 Gross per 24 hour  Intake 360  ml  Output --  Net 360 ml   Filed Weights   09/15/24 1342  Weight: 99.8 kg    Scheduled Meds:  acetaminophen   1,000 mg Oral Q6H   enoxaparin  (LOVENOX ) injection  40 mg Subcutaneous Q24H   fluticasone  furoate-vilanterol  1 puff Inhalation Daily   metoCLOPramide  (REGLAN ) injection  10 mg Intravenous TID AC & HS   pantoprazole   (PROTONIX ) IV  40 mg Intravenous Q12H   sodium chloride  flush  3 mL Intravenous Q12H   Continuous Infusions:  Nutritional status     Body mass index is 37.76 kg/m.  Data Reviewed:   CBC: Recent Labs  Lab 09/15/24 1354 09/16/24 0628 09/17/24 0111 09/18/24 0056 09/19/24 0527  WBC 12.7* 11.4* 9.2 9.5 7.3  HGB 14.4 12.9 12.9 13.0 14.9  HCT 45.6 40.8 41.2 41.7 47.0*  MCV 82.5 82.6 83.7 83.6 82.7  PLT 347 328 300 298 323   Basic Metabolic Panel: Recent Labs  Lab 09/15/24 1354 09/16/24 0628 09/17/24 0111 09/18/24 0056 09/19/24 0527  NA 138 141 137 137 136  K 3.8 4.3 3.9 4.1 4.0  CL 100 103 100 97* 96*  CO2 25 26 29 28 30   GLUCOSE 122* 103* 89 107* 115*  BUN 14 12 14 12 12   CREATININE 1.16* 1.07* 1.24* 1.29* 1.15*  CALCIUM 9.9 9.3 8.9 9.1 9.7  MG  --  2.3  --  2.2  --    GFR: Estimated Creatinine Clearance: 77.6 mL/min (A) (by C-G formula based on SCr of 1.15 mg/dL (H)). Liver Function Tests: Recent Labs  Lab 09/15/24 1354  AST 17  ALT 14  ALKPHOS 91  BILITOT 0.7  PROT 8.1  ALBUMIN  4.2   Recent Labs  Lab 09/15/24 1354  LIPASE 21   No results for input(s): AMMONIA in the last 168 hours. Coagulation Profile: No results for input(s): INR, PROTIME in the last 168 hours. Cardiac Enzymes: No results for input(s): CKTOTAL, CKMB, CKMBINDEX, TROPONINI in the last 168 hours. BNP (last 3 results) No results for input(s): PROBNP in the last 8760 hours. HbA1C: No results for input(s): HGBA1C in the last 72 hours. CBG: No results for input(s): GLUCAP in the last 168 hours. Lipid Profile: No results for input(s): CHOL, HDL, LDLCALC, TRIG, CHOLHDL, LDLDIRECT in the last 72 hours. Thyroid  Function Tests: No results for input(s): TSH, T4TOTAL, FREET4, T3FREE, THYROIDAB in the last 72 hours. Anemia Panel: No results for input(s): VITAMINB12, FOLATE, FERRITIN, TIBC, IRON , RETICCTPCT in the last 72 hours. Sepsis  Labs: No results for input(s): PROCALCITON, LATICACIDVEN in the last 168 hours.  No results found for this or any previous visit (from the past 240 hours).       Radiology Studies: DG Abd Portable 1V-Small Bowel Obstruction Protocol-initial, 8 hr delay Result Date: 09/17/2024 EXAM: 1 VIEW XRAY OF THE ABDOMEN 09/17/2024 08:06:00 PM COMPARISON: 09/17/2024 CLINICAL HISTORY: FINDINGS: BOWEL: Persistent dilation of the small bowel. Enteric contrast is present throughout the colon. SOFT TISSUES: Surgical clips and enteric suture lines in left lower quadrant. Postsurgical changes in left lower quadrant. No opaque urinary calculi. BONES: No acute osseous abnormality. IMPRESSION: 1. Persistent evaluation of the small bowel with passage of enteric contrast into the colon. Findings are compatible with ileus. Electronically signed by: Norman Gatlin MD 09/17/2024 08:25 PM EST RP Workstation: [id number]           LOS: 4 days   Time spent= 35 mins    Deliliah Room, MD [facility]  If 7PM-7AM, please  contact night-coverage  09/19/2024, 1:25 PM

## 2024-09-19 NOTE — Progress Notes (Signed)
 Pt had one episode of vomiting, it was a moderate amount, yellow in color. Provider made aware.

## 2024-09-19 NOTE — Progress Notes (Signed)
 CCS contacted for further intervention.  Patient with more abdominal distention and abdominal pain. Patient verbalizes relief after IV dilaudid  0.5mg   given.

## 2024-09-19 NOTE — Progress Notes (Addendum)
 16 Fr NG tube was inserted. The patient experienced immediate vomiting and a 300 mL green output via the tube, followed by 300 mL of emesis. Due to discomfort, the patient requested removal of the tube, which was performed. Noted small nose bleed on left nare. Held pressure on nose bridge until it subsided. The patient is willing to attempt reinsertion once feeling better. Report given to night nurse. CCS updated.

## 2024-09-19 NOTE — Progress Notes (Signed)
   Subjective/Chief Complaint: Patient with 1 episode of vomiting last night.  Feels bloated and uncomfortable this morning.   Objective: Vital signs in last 24 hours: Temp:  [98.1 F (36.7 C)-98.8 F (37.1 C)] 98.4 F (36.9 C) (12/01 0609) Pulse Rate:  [65-81] 78 (12/01 0609) Resp:  [16-17] 16 (12/01 0609) BP: (137-182)/(89-122) 155/94 (12/01 0609) SpO2:  [97 %-100 %] 97 % (12/01 0755) Last BM Date : 09/18/24  Intake/Output from previous day: 11/30 0701 - 12/01 0700 In: 460 [P.O.:460] Out: -  Intake/Output this shift: No intake/output data recorded.  Abdomen: Distended.  Scars noted from previous surgery.  No rebound or guarding.  Lab Results:  Recent Labs    09/18/24 0056 09/19/24 0527  WBC 9.5 7.3  HGB 13.0 14.9  HCT 41.7 47.0*  PLT 298 323   BMET Recent Labs    09/18/24 0056 09/19/24 0527  NA 137 136  K 4.1 4.0  CL 97* 96*  CO2 28 30  GLUCOSE 107* 115*  BUN 12 12  CREATININE 1.29* 1.15*  CALCIUM 9.1 9.7   PT/INR No results for input(s): LABPROT, INR in the last 72 hours. ABG No results for input(s): PHART, HCO3 in the last 72 hours.  Invalid input(s): PCO2, PO2  Studies/Results: DG Abd Portable 1V-Small Bowel Obstruction Protocol-initial, 8 hr delay Result Date: 09/17/2024 EXAM: 1 VIEW XRAY OF THE ABDOMEN 09/17/2024 08:06:00 PM COMPARISON: 09/17/2024 CLINICAL HISTORY: FINDINGS: BOWEL: Persistent dilation of the small bowel. Enteric contrast is present throughout the colon. SOFT TISSUES: Surgical clips and enteric suture lines in left lower quadrant. Postsurgical changes in left lower quadrant. No opaque urinary calculi. BONES: No acute osseous abnormality. IMPRESSION: 1. Persistent evaluation of the small bowel with passage of enteric contrast into the colon. Findings are compatible with ileus. Electronically signed by: Norman Gatlin MD 09/17/2024 08:25 PM EST RP Workstation: [id number]    Anti-infectives: Anti-infectives (From  admission, onward)    None       Assessment/Plan:  SBO in setting of complex PSH More nausea and vomiting overnight.  More distended today.  Repeat KUB  Manage replace NG tube if she continues to vomit  May need laparotomy the next 24 hours for failure to progress  Denies bowel movement or flatus in the last 18 hours   FEN - CLD, IVFs per primary VTE - Lovenox  ID - none currently needed   I reviewed hospitalist notes, last 24 h vitals and pain scores, last 48 h intake and output, last 24 h labs and trends, and last 24 h imaging results.     LOS: 4 days    Debby DELENA Shipper MD 09/19/2024 Moderate complexity

## 2024-09-19 NOTE — Plan of Care (Signed)

## 2024-09-19 NOTE — Progress Notes (Addendum)
 Administered 1mg  Morphine  IV as ordered for patient's reported abdominal pain. Patient verbalized within two minutes of administration that the pain persists with no relief.  Rechecked on patient. Patient reports current pain is worse than prior to medication administration. Pain scale reassessed from 8/10 to 10/10. Dr. Dino notified via epic chat  and made aware of the patient's worsening pain and insufficient pain control. IV Dilaudid  ordered  Dr. Dino advised to contact surgery since patient is requesting to MD/surgeon

## 2024-09-20 ENCOUNTER — Inpatient Hospital Stay (HOSPITAL_COMMUNITY): Admitting: Anesthesiology

## 2024-09-20 ENCOUNTER — Inpatient Hospital Stay (HOSPITAL_COMMUNITY)

## 2024-09-20 ENCOUNTER — Encounter (HOSPITAL_COMMUNITY): Admission: EM | Payer: Self-pay | Source: Home / Self Care | Attending: Hospitalist

## 2024-09-20 ENCOUNTER — Encounter (HOSPITAL_COMMUNITY): Payer: Self-pay | Admitting: Family Medicine

## 2024-09-20 DIAGNOSIS — K56609 Unspecified intestinal obstruction, unspecified as to partial versus complete obstruction: Secondary | ICD-10-CM

## 2024-09-20 DIAGNOSIS — K660 Peritoneal adhesions (postprocedural) (postinfection): Secondary | ICD-10-CM

## 2024-09-20 HISTORY — PX: LYSIS OF ADHESION: SHX5961

## 2024-09-20 HISTORY — PX: LAPAROTOMY: SHX154

## 2024-09-20 LAB — TYPE AND SCREEN
ABO/RH(D): O NEG
Antibody Screen: NEGATIVE

## 2024-09-20 SURGERY — LAPAROTOMY, EXPLORATORY
Anesthesia: General | Site: Abdomen

## 2024-09-20 MED ORDER — HYDROMORPHONE HCL 1 MG/ML IJ SOLN
0.5000 mg | INTRAMUSCULAR | Status: DC | PRN
  Administered 2024-09-20 (×2): 2 mg via INTRAVENOUS
  Administered 2024-09-21: 1 mg via INTRAVENOUS
  Administered 2024-09-21 (×5): 2 mg via INTRAVENOUS
  Administered 2024-09-21 (×2): 1 mg via INTRAVENOUS
  Administered 2024-09-21 (×3): 2 mg via INTRAVENOUS
  Administered 2024-09-22: 1 mg via INTRAVENOUS
  Filled 2024-09-20 (×3): qty 2
  Filled 2024-09-20: qty 1
  Filled 2024-09-20: qty 2
  Filled 2024-09-20: qty 1
  Filled 2024-09-20 (×2): qty 2
  Filled 2024-09-20: qty 1
  Filled 2024-09-20 (×2): qty 2
  Filled 2024-09-20: qty 1
  Filled 2024-09-20 (×2): qty 2

## 2024-09-20 MED ORDER — OXYCODONE HCL 5 MG PO TABS: 5.0000 mg | ORAL_TABLET | Freq: Once | ORAL | Status: DC | PRN

## 2024-09-20 MED ORDER — PROPOFOL 10 MG/ML IV BOLUS
INTRAVENOUS | Status: AC
  Filled 2024-09-20: qty 20

## 2024-09-20 MED ORDER — LIDOCAINE 2% (20 MG/ML) 5 ML SYRINGE
INTRAMUSCULAR | Status: AC
  Filled 2024-09-20: qty 5

## 2024-09-20 MED ORDER — MIDAZOLAM HCL (PF) 2 MG/2ML IJ SOLN
INTRAMUSCULAR | Status: DC | PRN
  Administered 2024-09-20: 2 mg via INTRAVENOUS

## 2024-09-20 MED ORDER — METHOCARBAMOL 1000 MG/10ML IJ SOLN
500.0000 mg | Freq: Four times a day (QID) | INTRAMUSCULAR | Status: DC
  Administered 2024-09-20 – 2024-09-21 (×6): 500 mg via INTRAVENOUS
  Filled 2024-09-20 (×6): qty 10

## 2024-09-20 MED ORDER — ACETAMINOPHEN 10 MG/ML IV SOLN: 1000.0000 mg | Freq: Once | INTRAVENOUS | Status: DC | PRN

## 2024-09-20 MED ORDER — DEXAMETHASONE SOD PHOSPHATE PF 10 MG/ML IJ SOLN
INTRAMUSCULAR | Status: DC | PRN
  Administered 2024-09-20: 10 mg via INTRAVENOUS

## 2024-09-20 MED ORDER — PHENYLEPHRINE HCL (PRESSORS) 10 MG/ML IV SOLN
INTRAVENOUS | Status: DC | PRN
  Administered 2024-09-20 (×2): 160 ug via INTRAVENOUS

## 2024-09-20 MED ORDER — CHLORHEXIDINE GLUCONATE 0.12 % MT SOLN: 15.0000 mL | Freq: Once | OROMUCOSAL | Status: AC

## 2024-09-20 MED ORDER — HYDROMORPHONE HCL 1 MG/ML IJ SOLN
0.5000 mg | INTRAMUSCULAR | Status: DC | PRN
  Administered 2024-09-20 (×3): 0.5 mg via INTRAVENOUS

## 2024-09-20 MED ORDER — FENTANYL CITRATE (PF) 250 MCG/5ML IJ SOLN
INTRAMUSCULAR | Status: AC
  Filled 2024-09-20: qty 5

## 2024-09-20 MED ORDER — ONDANSETRON HCL 4 MG/2ML IJ SOLN: 4.0000 mg | Freq: Four times a day (QID) | INTRAMUSCULAR | Status: DC | PRN

## 2024-09-20 MED ORDER — KETAMINE HCL 50 MG/5ML IJ SOSY
PREFILLED_SYRINGE | INTRAMUSCULAR | Status: AC
  Filled 2024-09-20: qty 5

## 2024-09-20 MED ORDER — METOCLOPRAMIDE HCL 5 MG/ML IJ SOLN: 10.0000 mg | Freq: Three times a day (TID) | INTRAMUSCULAR | Status: DC | PRN

## 2024-09-20 MED ORDER — SODIUM CHLORIDE 0.9 % IV SOLN: INTRAVENOUS | Status: DC

## 2024-09-20 MED ORDER — CHLORHEXIDINE GLUCONATE CLOTH 2 % EX PADS
6.0000 | MEDICATED_PAD | Freq: Every day | CUTANEOUS | Status: DC
  Administered 2024-09-20 – 2024-09-21 (×2): 6 via TOPICAL

## 2024-09-20 MED ORDER — ONDANSETRON HCL 4 MG/2ML IJ SOLN
INTRAMUSCULAR | Status: AC
  Filled 2024-09-20: qty 2

## 2024-09-20 MED ORDER — LACTATED RINGERS IV SOLN: INTRAVENOUS | Status: DC

## 2024-09-20 MED ORDER — KETOROLAC TROMETHAMINE 30 MG/ML IJ SOLN
30.0000 mg | Freq: Once | INTRAMUSCULAR | Status: AC
  Administered 2024-09-20: 30 mg via INTRAVENOUS

## 2024-09-20 MED ORDER — 0.9 % SODIUM CHLORIDE (POUR BTL) OPTIME
TOPICAL | Status: DC | PRN
  Administered 2024-09-20: 2000 mL

## 2024-09-20 MED ORDER — ACETAMINOPHEN 10 MG/ML IV SOLN
1000.0000 mg | Freq: Four times a day (QID) | INTRAVENOUS | Status: AC
  Administered 2024-09-20 – 2024-09-21 (×4): 1000 mg via INTRAVENOUS
  Filled 2024-09-20 (×4): qty 100

## 2024-09-20 MED ORDER — LIDOCAINE HCL (CARDIAC) PF 100 MG/5ML IV SOSY
PREFILLED_SYRINGE | INTRAVENOUS | Status: DC | PRN
  Administered 2024-09-20: 100 mg via INTRAVENOUS

## 2024-09-20 MED ORDER — KETOROLAC TROMETHAMINE 30 MG/ML IJ SOLN
INTRAMUSCULAR | Status: AC
  Filled 2024-09-20: qty 1

## 2024-09-20 MED ORDER — PROPOFOL 10 MG/ML IV BOLUS
INTRAVENOUS | Status: DC | PRN
  Administered 2024-09-20: 200 mg via INTRAVENOUS

## 2024-09-20 MED ORDER — SUGAMMADEX SODIUM 200 MG/2ML IV SOLN
INTRAVENOUS | Status: DC | PRN
  Administered 2024-09-20: 400 mg via INTRAVENOUS

## 2024-09-20 MED ORDER — ONDANSETRON HCL 4 MG/2ML IJ SOLN
INTRAMUSCULAR | Status: DC | PRN
  Administered 2024-09-20: 4 mg via INTRAVENOUS

## 2024-09-20 MED ORDER — MIDAZOLAM HCL 2 MG/2ML IJ SOLN
INTRAMUSCULAR | Status: AC
  Filled 2024-09-20: qty 2

## 2024-09-20 MED ORDER — HYDROMORPHONE HCL 1 MG/ML IJ SOLN
INTRAMUSCULAR | Status: AC
  Filled 2024-09-20: qty 1

## 2024-09-20 MED ORDER — ONDANSETRON HCL 4 MG PO TABS: 4.0000 mg | ORAL_TABLET | ORAL | Status: DC | PRN

## 2024-09-20 MED ORDER — ROCURONIUM BROMIDE 10 MG/ML (PF) SYRINGE
PREFILLED_SYRINGE | INTRAVENOUS | Status: AC
  Filled 2024-09-20: qty 10

## 2024-09-20 MED ORDER — CHLORHEXIDINE GLUCONATE 0.12 % MT SOLN
OROMUCOSAL | Status: AC
  Administered 2024-09-20: 15 mL via OROMUCOSAL
  Filled 2024-09-20: qty 15

## 2024-09-20 MED ORDER — ORAL CARE MOUTH RINSE: 15.0000 mL | Freq: Once | OROMUCOSAL | Status: AC

## 2024-09-20 MED ORDER — PHENYLEPHRINE HCL-NACL 20-0.9 MG/250ML-% IV SOLN
INTRAVENOUS | Status: DC | PRN
  Administered 2024-09-20: 40 ug/min via INTRAVENOUS

## 2024-09-20 MED ORDER — PHENYLEPHRINE 80 MCG/ML (10ML) SYRINGE FOR IV PUSH (FOR BLOOD PRESSURE SUPPORT)
PREFILLED_SYRINGE | INTRAVENOUS | Status: DC | PRN
  Administered 2024-09-20: 80 ug via INTRAVENOUS

## 2024-09-20 MED ORDER — DEXMEDETOMIDINE HCL IN NACL 80 MCG/20ML IV SOLN
INTRAVENOUS | Status: AC
  Filled 2024-09-20: qty 40

## 2024-09-20 MED ORDER — SODIUM CHLORIDE 0.9 % IV SOLN
2.0000 g | INTRAVENOUS | Status: AC
  Administered 2024-09-20: 2 g via INTRAVENOUS
  Filled 2024-09-20: qty 2

## 2024-09-20 MED ORDER — ONDANSETRON HCL 4 MG/2ML IJ SOLN: 4.0000 mg | Freq: Once | INTRAMUSCULAR | Status: DC | PRN

## 2024-09-20 MED ORDER — SUCCINYLCHOLINE CHLORIDE 200 MG/10ML IV SOSY
PREFILLED_SYRINGE | INTRAVENOUS | Status: DC | PRN
  Administered 2024-09-20: 200 mg via INTRAVENOUS

## 2024-09-20 MED ORDER — ROCURONIUM BROMIDE 100 MG/10ML IV SOLN
INTRAVENOUS | Status: DC | PRN
  Administered 2024-09-20: 80 mg via INTRAVENOUS

## 2024-09-20 MED ORDER — OXYCODONE HCL 5 MG/5ML PO SOLN: 5.0000 mg | Freq: Once | ORAL | Status: DC | PRN

## 2024-09-20 MED ORDER — FENTANYL CITRATE (PF) 250 MCG/5ML IJ SOLN
INTRAMUSCULAR | Status: DC | PRN
  Administered 2024-09-20: 150 ug via INTRAVENOUS
  Administered 2024-09-20: 100 ug via INTRAVENOUS

## 2024-09-20 MED ORDER — PROCHLORPERAZINE EDISYLATE 10 MG/2ML IJ SOLN: 5.0000 mg | INTRAMUSCULAR | Status: DC | PRN

## 2024-09-20 MED ORDER — KETAMINE HCL 50 MG/5ML IJ SOSY
PREFILLED_SYRINGE | INTRAMUSCULAR | Status: DC | PRN
  Administered 2024-09-20: 20 mg via INTRAVENOUS
  Administered 2024-09-20: 30 mg via INTRAVENOUS
  Administered 2024-09-20: 50 mg via INTRAVENOUS

## 2024-09-20 MED ORDER — DEXMEDETOMIDINE HCL IN NACL 80 MCG/20ML IV SOLN
INTRAVENOUS | Status: DC | PRN
  Administered 2024-09-20 (×4): 10 ug via INTRAVENOUS

## 2024-09-20 SURGICAL SUPPLY — 31 items
CHLORAPREP W/TINT 26 (MISCELLANEOUS) ×1 IMPLANT
COVER SURGICAL LIGHT HANDLE (MISCELLANEOUS) ×1 IMPLANT
DRAPE LAPAROSCOPIC ABDOMINAL (DRAPES) ×1 IMPLANT
DRAPE WARM FLUID 44X44 (DRAPES) ×1 IMPLANT
DRSG OPSITE POSTOP 4X10 (GAUZE/BANDAGES/DRESSINGS) IMPLANT
ELECT BLADE 6.5 EXT (BLADE) IMPLANT
ELECT CAUTERY BLADE 6.4 (BLADE) ×1 IMPLANT
ELECTRODE REM PT RTRN 9FT ADLT (ELECTROSURGICAL) ×1 IMPLANT
GLOVE BIO SURGEON STRL SZ8 (GLOVE) ×1 IMPLANT
GLOVE BIOGEL PI IND STRL 8 (GLOVE) ×1 IMPLANT
GOWN STRL REUS W/ TWL LRG LVL3 (GOWN DISPOSABLE) ×1 IMPLANT
GOWN STRL REUS W/ TWL XL LVL3 (GOWN DISPOSABLE) ×1 IMPLANT
HANDLE SUCTION POOLE (INSTRUMENTS) ×1 IMPLANT
KIT BASIN OR (CUSTOM PROCEDURE TRAY) ×1 IMPLANT
KIT TURNOVER KIT B (KITS) ×1 IMPLANT
LIGASURE IMPACT 36 18CM CVD LR (INSTRUMENTS) IMPLANT
MANIFOLD NEPTUNE II (INSTRUMENTS) IMPLANT
PACK GENERAL/GYN (CUSTOM PROCEDURE TRAY) ×1 IMPLANT
PAD ARMBOARD POSITIONER FOAM (MISCELLANEOUS) ×1 IMPLANT
RELOAD STAPLE 75 3.8 BLU REG (ENDOMECHANICALS) IMPLANT
SOLN 0.9% NACL POUR BTL 1000ML (IV SOLUTION) ×2 IMPLANT
STAPLER GUN LINEAR PROX 60 (STAPLE) IMPLANT
STAPLER PROXIMATE 75MM BLUE (STAPLE) IMPLANT
STAPLER SKIN PROX 35W (STAPLE) ×1 IMPLANT
SUT PDS AB 1 TP1 96 (SUTURE) ×2 IMPLANT
SUT VIC AB 2-0 SH 18 (SUTURE) ×1 IMPLANT
SUT VIC AB 3-0 SH 18 (SUTURE) ×1 IMPLANT
SUT VICRYL AB 2 0 TIES (SUTURE) ×1 IMPLANT
SUT VICRYL AB 3 0 TIES (SUTURE) ×1 IMPLANT
TOWEL GREEN STERILE FF (TOWEL DISPOSABLE) ×1 IMPLANT
TRAY FOLEY MTR SLVR 14FR STAT (SET/KITS/TRAYS/PACK) IMPLANT

## 2024-09-20 NOTE — Transfer of Care (Signed)
 Immediate Anesthesia Transfer of Care Note  Patient: Gwendolyn Nguyen  Procedure(s) Performed: LAPAROTOMY, EXPLORATORY WITH SMALL BOWEL RESECTION (Abdomen) LYSIS OF ADHESIONS (Abdomen)  Patient Location: PACU  Anesthesia Type:General  Level of Consciousness: sedated and responds to stimulation  Airway & Oxygen Therapy: Patient Spontanous Breathing and Patient connected to face mask oxygen  Post-op Assessment: Report given to RN and Post -op Vital signs reviewed and stable  Post vital signs: Reviewed and stable  Last Vitals:  Vitals Value Taken Time  BP 122/94 09/20/24 16:00  Temp    Pulse 82 09/20/24 15:58  Resp 12 09/20/24 16:01  SpO2 95 % 09/20/24 15:58  Vitals shown include unfiled device data.  Last Pain:  Vitals:   09/20/24 1333  TempSrc:   PainSc: 5       Patients Stated Pain Goal: 2 (09/20/24 0442)  Complications: There were no known notable events for this encounter.

## 2024-09-20 NOTE — H&P (View-Only) (Signed)
 Subjective/Chief Complaint: Had NGT reinserted overnight with 300cc out and then vomited 300cc of bilious effluent.  Unable to tolerate NGT and removed it.  Still having some pain in her abdomen, but feels a little better this morning.  Passing some flatus.   Objective: Vital signs in last 24 hours: Temp:  [98 F (36.7 C)-99.2 F (37.3 C)] 98.9 F (37.2 C) (12/02 0607) Pulse Rate:  [69-92] 69 (12/02 0607) Resp:  [17-18] 18 (12/02 0607) BP: (122-154)/(83-121) 122/83 (12/02 0607) SpO2:  [97 %-100 %] 98 % (12/02 0750) Last BM Date : 09/18/24  Intake/Output from previous day: 12/01 0701 - 12/02 0700 In: 1123.1 [P.O.:100; I.V.:823.1; IV Piggyback:200] Out: -  Intake/Output this shift: No intake/output data recorded.  Abdomen: some distention.  Scars noted from previous surgery.  No rebound or guarding.  Lab Results:  Recent Labs    09/18/24 0056 09/19/24 0527  WBC 9.5 7.3  HGB 13.0 14.9  HCT 41.7 47.0*  PLT 298 323   BMET Recent Labs    09/18/24 0056 09/19/24 0527  NA 137 136  K 4.1 4.0  CL 97* 96*  CO2 28 30  GLUCOSE 107* 115*  BUN 12 12  CREATININE 1.29* 1.15*  CALCIUM 9.1 9.7   PT/INR No results for input(s): LABPROT, INR in the last 72 hours. ABG No results for input(s): PHART, HCO3 in the last 72 hours.  Invalid input(s): PCO2, PO2  Studies/Results: DG Abd Portable 1V Result Date: 09/20/2024 EXAM: 1 VIEW XRAY OF THE ABDOMEN 09/20/2024 05:57:03 AM COMPARISON: Comparison yesterday, 09/19/2024. CLINICAL HISTORY: SBO (small bowel obstruction) (HCC). FINDINGS: BOWEL: Stable small bowel dilatation concerning for ileus or distal small bowel obstruction. Contrast noted in nondilated colon. SOFT TISSUES: No opaque urinary calculi. BONES: No acute osseous abnormality. IMPRESSION: 1. Stable small bowel dilatation concerning for ileus or distal small bowel obstruction. 2. Contrast present within a nondilated colon. Electronically signed by: Lynwood Seip  MD 09/20/2024 08:02 AM EST RP Workstation: [id number]   DG Abd Portable 1V Result Date: 09/19/2024 CLINICAL DATA:  Abdominal pain EXAM: PORTABLE ABDOMEN - 1 VIEW COMPARISON:  Abdominal x-ray 09/19/2024 FINDINGS: There are dilated air-filled small bowel loops in the central and left abdomen measuring up to 4.5 cm similar to the prior examination. Contrast is seen throughout nondilated colon, also unchanged. No evidence for free air on this supine view. Lung bases are clear. No suspicious calcifications are identified. There surgical clips in the mid abdomen. IMPRESSION: Stable partial small bowel obstruction. Electronically Signed   By: Greig Pique M.D.   On: 09/19/2024 22:25   DG Abd 1 View Result Date: 09/19/2024 CLINICAL DATA:  Follow-up small bowel obstruction. EXAM: ABDOMEN - 1 VIEW COMPARISON:  09/17/2024.  Abdomen and pelvis CT dated 09/15/2024. FINDINGS: No significant change in multiple dilated small bowel loops. Oral contrast throughout normal caliber colon and rectum. Left mid to lower abdominal and right pelvic surgical clips. Unremarkable bones. IMPRESSION: Stable partial small bowel obstruction. Electronically Signed   By: Elspeth Bathe M.D.   On: 09/19/2024 15:51    Anti-infectives: Anti-infectives (From admission, onward)    Start     Dose/Rate Route Frequency Ordered Stop   09/20/24 1000  cefoTEtan (CEFOTAN) 2 g in sodium chloride  0.9 % 100 mL IVPB        2 g 200 mL/hr over 30 Minutes Intravenous On call to O.R. 09/20/24 0901 09/21/24 0559       Assessment/Plan:  SBO in setting of complex PSH -More nausea  and vomiting overnight.  + flatus this am -reviewed plain film this morning.  Her SB is much more dilated than yesterday and today than it had been previously -given persistent symptoms, I think the patient likely has a high grade partial obstruction that will not allow her to resolve without surgical intervention. -we discussed this at length.  She is agreeable to move  forward with operative intervention after discussion of the procedure, etc. -she requests that her NGT be placed in her R nare during surgery.  I told her to let the CRNA know when she gets downstairs since they will be the ones placing it. -d/w primary service.   FEN - NPO/IVFs per primary VTE - Lovenox  ID - cefotetan on call   I reviewed hospitalist notes, last 24 h vitals and pain scores, last 48 h intake and output, last 24 h labs and trends, and last 24 h imaging results.     LOS: 5 days   Burnard FORBES Banter, PA-C  09/20/2024

## 2024-09-20 NOTE — Progress Notes (Signed)
 Subjective/Chief Complaint: Had NGT reinserted overnight with 300cc out and then vomited 300cc of bilious effluent.  Unable to tolerate NGT and removed it.  Still having some pain in her abdomen, but feels a little better this morning.  Passing some flatus.   Objective: Vital signs in last 24 hours: Temp:  [98 F (36.7 C)-99.2 F (37.3 C)] 98.9 F (37.2 C) (12/02 0607) Pulse Rate:  [69-92] 69 (12/02 0607) Resp:  [17-18] 18 (12/02 0607) BP: (122-154)/(83-121) 122/83 (12/02 0607) SpO2:  [97 %-100 %] 98 % (12/02 0750) Last BM Date : 09/18/24  Intake/Output from previous day: 12/01 0701 - 12/02 0700 In: 1123.1 [P.O.:100; I.V.:823.1; IV Piggyback:200] Out: -  Intake/Output this shift: No intake/output data recorded.  Abdomen: some distention.  Scars noted from previous surgery.  No rebound or guarding.  Lab Results:  Recent Labs    09/18/24 0056 09/19/24 0527  WBC 9.5 7.3  HGB 13.0 14.9  HCT 41.7 47.0*  PLT 298 323   BMET Recent Labs    09/18/24 0056 09/19/24 0527  NA 137 136  K 4.1 4.0  CL 97* 96*  CO2 28 30  GLUCOSE 107* 115*  BUN 12 12  CREATININE 1.29* 1.15*  CALCIUM 9.1 9.7   PT/INR No results for input(s): LABPROT, INR in the last 72 hours. ABG No results for input(s): PHART, HCO3 in the last 72 hours.  Invalid input(s): PCO2, PO2  Studies/Results: DG Abd Portable 1V Result Date: 09/20/2024 EXAM: 1 VIEW XRAY OF THE ABDOMEN 09/20/2024 05:57:03 AM COMPARISON: Comparison yesterday, 09/19/2024. CLINICAL HISTORY: SBO (small bowel obstruction) (HCC). FINDINGS: BOWEL: Stable small bowel dilatation concerning for ileus or distal small bowel obstruction. Contrast noted in nondilated colon. SOFT TISSUES: No opaque urinary calculi. BONES: No acute osseous abnormality. IMPRESSION: 1. Stable small bowel dilatation concerning for ileus or distal small bowel obstruction. 2. Contrast present within a nondilated colon. Electronically signed by: Lynwood Seip  MD 09/20/2024 08:02 AM EST RP Workstation: [id number]   DG Abd Portable 1V Result Date: 09/19/2024 CLINICAL DATA:  Abdominal pain EXAM: PORTABLE ABDOMEN - 1 VIEW COMPARISON:  Abdominal x-ray 09/19/2024 FINDINGS: There are dilated air-filled small bowel loops in the central and left abdomen measuring up to 4.5 cm similar to the prior examination. Contrast is seen throughout nondilated colon, also unchanged. No evidence for free air on this supine view. Lung bases are clear. No suspicious calcifications are identified. There surgical clips in the mid abdomen. IMPRESSION: Stable partial small bowel obstruction. Electronically Signed   By: Greig Pique M.D.   On: 09/19/2024 22:25   DG Abd 1 View Result Date: 09/19/2024 CLINICAL DATA:  Follow-up small bowel obstruction. EXAM: ABDOMEN - 1 VIEW COMPARISON:  09/17/2024.  Abdomen and pelvis CT dated 09/15/2024. FINDINGS: No significant change in multiple dilated small bowel loops. Oral contrast throughout normal caliber colon and rectum. Left mid to lower abdominal and right pelvic surgical clips. Unremarkable bones. IMPRESSION: Stable partial small bowel obstruction. Electronically Signed   By: Elspeth Bathe M.D.   On: 09/19/2024 15:51    Anti-infectives: Anti-infectives (From admission, onward)    Start     Dose/Rate Route Frequency Ordered Stop   09/20/24 1000  cefoTEtan (CEFOTAN) 2 g in sodium chloride  0.9 % 100 mL IVPB        2 g 200 mL/hr over 30 Minutes Intravenous On call to O.R. 09/20/24 0901 09/21/24 0559       Assessment/Plan:  SBO in setting of complex PSH -More nausea  and vomiting overnight.  + flatus this am -reviewed plain film this morning.  Her SB is much more dilated than yesterday and today than it had been previously -given persistent symptoms, I think the patient likely has a high grade partial obstruction that will not allow her to resolve without surgical intervention. -we discussed this at length.  She is agreeable to move  forward with operative intervention after discussion of the procedure, etc. -she requests that her NGT be placed in her R nare during surgery.  I told her to let the CRNA know when she gets downstairs since they will be the ones placing it. -d/w primary service.   FEN - NPO/IVFs per primary VTE - Lovenox  ID - cefotetan on call   I reviewed hospitalist notes, last 24 h vitals and pain scores, last 48 h intake and output, last 24 h labs and trends, and last 24 h imaging results.     LOS: 5 days   Burnard FORBES Banter, PA-C  09/20/2024

## 2024-09-20 NOTE — Anesthesia Preprocedure Evaluation (Addendum)
 Anesthesia Evaluation  Patient identified by MRN, date of birth, ID band Patient awake    Reviewed: Allergy  & Precautions, NPO status , Patient's Chart, lab work & pertinent test results, reviewed documented beta blocker date and time   History of Anesthesia Complications Negative for: history of anesthetic complications  Airway Mallampati: II  TM Distance: >3 FB     Dental no notable dental hx.    Pulmonary shortness of breath, asthma    breath sounds clear to auscultation       Cardiovascular hypertension,  Rhythm:Regular Rate:Normal     Neuro/Psych neg Seizures PSYCHIATRIC DISORDERS  Depression       GI/Hepatic ,,,(+) neg Cirrhosis      SBO   Endo/Other    Renal/GU Renal disease     Musculoskeletal   Abdominal  (+)   Bowel sounds: absent.  Peds  Hematology  (+) Blood dyscrasia, anemia   Anesthesia Other Findings   Reproductive/Obstetrics                              Anesthesia Physical Anesthesia Plan  ASA: 2  Anesthesia Plan: General   Post-op Pain Management:    Induction: Intravenous and Rapid sequence  PONV Risk Score and Plan: 2 and Ondansetron   Airway Management Planned: Oral ETT  Additional Equipment:   Intra-op Plan:   Post-operative Plan: Extubation in OR  Informed Consent: I have reviewed the patients History and Physical, chart, labs and discussed the procedure including the risks, benefits and alternatives for the proposed anesthesia with the patient or authorized representative who has indicated his/her understanding and acceptance.     Dental advisory given  Plan Discussed with: CRNA  Anesthesia Plan Comments:          Anesthesia Quick Evaluation

## 2024-09-20 NOTE — Interval H&P Note (Signed)
 History and Physical Interval Note:  09/20/2024 1:26 PM  Gaby Harney  has presented today for surgery, with the diagnosis of lysis of adhesions.  The various methods of treatment have been discussed with the patient and family. After consideration of risks, benefits and other options for treatment, the patient has consented to  Procedure(s): LAPAROTOMY, EXPLORATORY (N/A) as a surgical intervention.  The patient's history has been reviewed, patient examined, no change in status, stable for surgery.  I have reviewed the patient's chart and labs.  Questions were answered to the patient's satisfaction.    The patient has not progressed despite maximal medical management.  She does have contrast in her colon but unfortunately still has pain and distention therefore is a high-grade partial obstruction.  Recommend exploratory laparotomy with lysis of adhesions possible bowel resection depending on findings.  Risks and benefits reviewed.  Complications of bleeding, infection, bowel injury, bladder injury, colon injury, need for ostomy, fistula formation, wound complications and recurrent bowel obstructions reviewed today.The procedure has been discussed with the patient.  Alternative therapies have been discussed with the patient.  Operative risks include bleeding,  Infection,  Organ injury,  Nerve injury,  Blood vessel injury,  DVT,  Pulmonary embolism,  Death,  And possible reoperation.  Medical management risks include worsening of present situation.  The success of the procedure is 50 -90 % at treating patients symptoms.  The patient understands and agrees to proceed.  Brilyn Tuller A Jasmarie Coppock

## 2024-09-20 NOTE — Progress Notes (Signed)
 Offered to insert NG tube, patient requested that nausea and pain be resolved first. Patient also requested to give some more time before re inserting.

## 2024-09-20 NOTE — Progress Notes (Signed)
 Offered to insert NG tube, patient wants to know first if she is going for surgery. NG tube not inserted.

## 2024-09-20 NOTE — Progress Notes (Signed)
 Progress Note    Gwendolyn Nguyen   FMW:[id number]  DOB: [birth year]  DOA: 09/15/2024     5 PCP: Jerome Heron Ruth, PA-C  Initial CC: abd pain, N/V  Hospital Course: 38 y.o. female with medical history significant for depression, fibromyalgia, hypertension, urticaria, reactive airways disease, history of abdominal hysterectomy with bilateral salpingo-oophorectomy, and laparoscopic lysis of adhesions in 2020 who presented with abdominal pain, nausea, and vomiting.  CT of the abdomen and pelvis reveals SBO.    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  - Appreciate surgery assessment and recommendations - trialed on CLD but did not have sufficient improvement with serial imaging and monitoring - ultimately recommended for ex lap on 12/2; underwent partial SBR and LOA - monitor for return of bowel function post-op - defer need for TPN to surgery given semi-prolonged NPO and likely ileus post-op   Asthma  - Not in exacerbation   - Continue ICS-LABA and as-needed SABA     Hypertension -No history of hypertension and not taking any medications at home -May be related to stress due to small bowel obstruction -Treat as-needed only for now with hydralazine and labetalol   Interval History:  No events overnight.  Resting in bed comfortable this morning with no significant abdominal pain but still not having much BM (liquid mostly).  Understands she will undergo ex-lap today.    Antimicrobials:   DVT prophylaxis:  enoxaparin  (LOVENOX ) injection 40 mg Start: 09/16/24 1000   Code Status:   Code Status: Full Code  Mobility Assessment (Last 72 Hours)     Mobility Assessment     Row Name 09/20/24 1231 09/19/24 2040 09/19/24 0800 09/18/24 2330 09/18/24 0900   Does the patient have exclusion criteria? No- Perform mobility assessment No- Perform mobility assessment No-  Perform mobility assessment No- Perform mobility assessment No- Perform mobility assessment   What is the highest level of mobility based on the mobility assessment? Level 4 (Ambulates with assistance) - Balance while stepping forward/back - Complete Level 4 (Ambulates with assistance) - Balance while stepping forward/back - Complete Level 5 (Ambulates independently) - Balance while walking independently - Complete Level 5 (Ambulates independently) - Balance while walking independently - Complete Level 5 (Ambulates independently) - Balance while walking independently - Complete    Row Name 09/17/24 2030           Does the patient have exclusion criteria? No- Perform mobility assessment       What is the highest level of mobility based on the mobility assessment? Level 5 (Ambulates independently) - Balance while walking independently - Complete          Diet: Diet Orders (From admission, onward)     Start     Ordered   09/20/24 0859  Diet NPO time specified Except for: Sips with Meds  Diet effective now       Question:  Except for  Answer:  [name] with Meds   09/20/24 0901            Barriers to discharge: none Disposition Plan:  Home  HH orders placed: n/a Status is: Inpt  Objective: Blood pressure (!) 138/96, pulse 90, temperature 98.1 F (36.7 C), resp. rate 13, height 5' 4 (1.626 m), weight 99.8 kg, last menstrual period 07/06/2019, SpO2 92%.  Examination:  Physical Exam Constitutional:      Appearance: Normal appearance.  HENT:  Head: Normocephalic and atraumatic.     Mouth/Throat:     Mouth: Mucous membranes are moist.  Eyes:     Extraocular Movements: Extraocular movements intact.  Cardiovascular:     Rate and Rhythm: Normal rate and regular rhythm.  Pulmonary:     Effort: Pulmonary effort is normal. No respiratory distress.     Breath sounds: Normal breath sounds. No wheezing.  Abdominal:     General: Bowel sounds are decreased. There is no distension.      Palpations: Abdomen is soft.     Tenderness: There is no abdominal tenderness.     Comments: Decreased BS in lower quadrants  Musculoskeletal:        General: Normal range of motion.     Cervical back: Normal range of motion and neck supple.  Skin:    General: Skin is warm and dry.  Neurological:     General: No focal deficit present.     Mental Status: She is alert.  Psychiatric:        Mood and Affect: Mood normal.      Consultants:  General surgery  Procedures:  12/2: Ex-lap with partial SBR and LOA  Data Reviewed: Results for orders placed or performed during the hospital encounter of 09/15/24 (from the past 24 hours)  Type and screen [facility]     Status: None   Collection Time: 09/20/24  9:38 AM  Result Value Ref Range   ABO/RH(D) O NEG    Antibody Screen NEG    Sample Expiration      09/23/2024,2359 Performed at [facility], [address] [place], [place] [address]     I have reviewed pertinent nursing notes, vitals, labs, and images as necessary. I have ordered labwork to follow up on as indicated.  I have reviewed the last notes from staff over past 24 hours. I have discussed patient's care plan and test results with nursing staff, CM/SW, and other staff as appropriate.  Old records reviewed in assessment of this patient  Time spent: Greater than 50% of the 55 minute visit was spent in counseling/coordination of care for the patient as laid out in the A&P.   LOS: 5 days   Alm Apo, MD [facility] 09/20/2024, 5:18 PM

## 2024-09-20 NOTE — Op Note (Signed)
 Preoperative diagnosis: Small bowel obstruction  Postoperative diagnosis: Same  Procedure: Exploratory laparotomy with small bowel resection and lysis of adhesions  Surgeon: Debby Shipper, MD  Assistant: Burnard Banter, PA-C  Anesthesia: General  EBL: Minimal  Specimen: Segment of distal ileum to pathology  Indications for procedure: The patient is a 38 year old female with small bowel obstruction.  Past history of multiple abdominal procedures.  She has been managed nonoperatively and initially improved but has worsened.  Laparotomy recommended to the patient since she has not progressed with medical management and NG tube management.The procedure has been discussed with the patient.  Alternative therapies have been discussed with the patient.  Operative risks include bleeding,  Infection,  Organ injury,  Nerve injury, small bowel resection, possible colostomy, blood vessel injury,  DVT,  Pulmonary embolism,  Death,  And possible reoperation.  Medical management risks include worsening of present situation.  The success of the procedure is 50 -90 % at treating patients symptoms.  The patient understands and agrees to proceed.    Description of procedure: The patient was met in the holding area and questions were answered.  She was taken to the operating and placed supine upon the operating room table.  After induction of general anesthesia, the abdomen is prepped and draped in sterile fashion and timeout performed.  She received appropriate preoperative antibiotics.  A midline incision was used and dissection was carried down to the previous scar in her midline.  The fascia was encountered.  A small incision was made I was able to get my finger into the abdominal cavity.  We opened the fascia in a cranial fashion toward the xiphoid.  We then extended our incision down toward the pubis.  There is a loop of tethered small bowel that we had to take off the anterior abdominal wall.  It was quite  contorted and twisted.  There were multiple interloop adhesions and adhesions of the small bowel to the abdominal wall.  She had a previous ileostomy and subsequent takedown.  This anastomosis was identified.  This was in the mid ileum.  There is significant lesions in the segment of bowel where the bowel was taken off the anterior abdominal wall and where the ileostomy anastomosis was.  We opted to resect this due to tortuosity of it.  This was done with 2 firings of a GIA 75 stapler.  A common anastomosis was created with a third firing of the GIA 75 stapler and a TX 60.  This made a nice new anastomosis with removing the previous anastomosis.  This was widely patent.  Stitches were placed in the crotch anastomosis.  There is no tension or evidence of leakage.  Common defect closed with 3-0 Vicryl.  There is an area of constriction of the small bowel from adhesion this was released.  We then ran the small bowel from the ligament of Treitz to the ileocecal valve and it was widely patent.  The colon was examined and was grossly normal.  Liver and stomach were grossly normal.  NG tube in the stomach.  No liver masses or gallbladder pathology that was obvious.  We then closed the abdomen with double-stranded #1 PDS.  Skin was closed staples.  All counts were found to be correct.  Honeycomb dressing placed.  The patient was then awoke extubated taken to recovery in satisfactory condition.

## 2024-09-20 NOTE — Anesthesia Procedure Notes (Signed)
 Procedure Name: Intubation Date/Time: 09/20/2024 2:04 PM  Performed by: Gabryelle Whitmoyer L, CRNAPre-anesthesia Checklist: Patient identified, Emergency Drugs available, Suction available and Patient being monitored Patient Re-evaluated:Patient Re-evaluated prior to induction Oxygen Delivery Method: Circle System Utilized Preoxygenation: Pre-oxygenation with 100% oxygen Induction Type: IV induction Ventilation: Mask ventilation without difficulty Laryngoscope Size: Mac and 3 Grade View: Grade I Tube type: Oral Tube size: 7.5 mm Number of attempts: 1 Airway Equipment and Method: Stylet and Oral airway Placement Confirmation: ETT inserted through vocal cords under direct vision, positive ETCO2 and breath sounds checked- equal and bilateral Secured at: 21 cm Tube secured with: Tape Dental Injury: Teeth and Oropharynx as per pre-operative assessment

## 2024-09-20 NOTE — Plan of Care (Signed)

## 2024-09-21 ENCOUNTER — Encounter: Payer: Self-pay | Admitting: Nurse Practitioner

## 2024-09-21 ENCOUNTER — Encounter (HOSPITAL_COMMUNITY): Payer: Self-pay | Admitting: Surgery

## 2024-09-21 DIAGNOSIS — K56609 Unspecified intestinal obstruction, unspecified as to partial versus complete obstruction: Secondary | ICD-10-CM | POA: Diagnosis not present

## 2024-09-21 LAB — BASIC METABOLIC PANEL WITH GFR
Anion gap: 7 (ref 5–15)
BUN: 14 mg/dL (ref 6–20)
CO2: 27 mmol/L (ref 22–32)
Calcium: 9 mg/dL (ref 8.9–10.3)
Chloride: 103 mmol/L (ref 98–111)
Creatinine, Ser: 1.2 mg/dL — ABNORMAL HIGH (ref 0.44–1.00)
GFR, Estimated: >60 mL/min (ref >60)
Glucose, Bld: 119 mg/dL — ABNORMAL HIGH (ref 70–99)
Potassium: 4.4 mmol/L (ref 3.5–5.1)
Sodium: 137 mmol/L (ref 135–145)

## 2024-09-21 LAB — CBC WITH DIFFERENTIAL/PLATELET
Abs Immature Granulocytes: 0.05 K/uL (ref 0.00–0.07)
Basophils Absolute: 0 K/uL (ref 0.0–0.1)
Basophils Relative: 0 %
Eosinophils Absolute: 0 K/uL (ref 0.0–0.5)
Eosinophils Relative: 0 %
HCT: 43.9 % (ref 36.0–46.0)
Hemoglobin: 13.7 g/dL (ref 12.0–15.0)
Immature Granulocytes: 0 %
Lymphocytes Relative: 5 %
Lymphs Abs: 0.7 K/uL (ref 0.7–4.0)
MCH: 26.1 pg (ref 26.0–34.0)
MCHC: 31.2 g/dL (ref 30.0–36.0)
MCV: 83.8 fL (ref 80.0–100.0)
Monocytes Absolute: 0.8 K/uL (ref 0.1–1.0)
Monocytes Relative: 6 %
Neutro Abs: 10.6 K/uL — ABNORMAL HIGH (ref 1.7–7.7)
Neutrophils Relative %: 89 %
Platelets: 385 K/uL (ref 150–400)
RBC: 5.24 MIL/uL — ABNORMAL HIGH (ref 3.87–5.11)
RDW: 14.2 % (ref 11.5–15.5)
WBC: 12.1 K/uL — ABNORMAL HIGH (ref 4.0–10.5)
nRBC: 0 % (ref 0.0–0.2)

## 2024-09-21 LAB — MAGNESIUM: Magnesium: 2.1 mg/dL (ref 1.7–2.4)

## 2024-09-21 MED ORDER — PHENOL 1.4 % MT LIQD
1.0000 | OROMUCOSAL | Status: DC | PRN
  Administered 2024-09-21: 1 via OROMUCOSAL
  Filled 2024-09-21: qty 177

## 2024-09-21 MED ORDER — DIPHENHYDRAMINE HCL 50 MG/ML IJ SOLN
12.5000 mg | Freq: Once | INTRAMUSCULAR | Status: AC
  Administered 2024-09-21: 12.5 mg via INTRAVENOUS
  Filled 2024-09-21: qty 1

## 2024-09-21 MED ORDER — DIPHENHYDRAMINE HCL 50 MG/ML IJ SOLN
12.5000 mg | INTRAMUSCULAR | Status: DC | PRN
  Administered 2024-09-21: 12.5 mg via INTRAVENOUS
  Filled 2024-09-21: qty 1

## 2024-09-21 NOTE — Progress Notes (Signed)
 Progress Note    Gwendolyn Nguyen   FMW:[id number]  DOB: [birth year]  DOA: 09/15/2024     6 PCP: Jerome Heron Ruth, PA-C  Initial CC: abd pain, N/V  Hospital Course: 38 y.o. female with medical history significant for depression, fibromyalgia, hypertension, urticaria, reactive airways disease, history of abdominal hysterectomy with bilateral salpingo-oophorectomy, and laparoscopic lysis of adhesions in 2020 who presented with abdominal pain, nausea, and vomiting.  CT of the abdomen and pelvis reveals SBO.    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  Monitor output   Asthma  - Not in exacerbation   - Continue ICS-LABA and as-needed SABA     Hypertension -No history of hypertension and not taking any medications at home -May be related to stress due to small bowel obstruction -Treat as-needed only for now with hydralazine  and labetalol   Interval History:  Appears more comfortable this morning.  Postop pain expected.  NG tube in place.  No flatus or BM yet.  No significant nausea nor vomiting either. Encouraged her to ambulate some as well today.   Antimicrobials:   DVT prophylaxis:  enoxaparin  (LOVENOX ) injection 40 mg Start: 09/16/24 1000   Code Status:   Code Status: Full Code  Mobility Assessment (Last 72 Hours)     Mobility Assessment     Row Name 09/21/24 1145 09/20/24 2040 09/20/24 1231 09/19/24 2040 09/19/24 0800   Does the patient have exclusion criteria? No- Perform mobility  assessment No- Perform mobility assessment No- Perform mobility assessment No- Perform mobility assessment No- Perform mobility assessment   What is the highest level of mobility based on the mobility assessment? Level 4 (Ambulates with assistance) - Balance while stepping forward/back - Complete Level 4 (Ambulates with assistance) - Balance while stepping forward/back - Complete Level 4 (Ambulates with assistance) - Balance while stepping forward/back - Complete Level 4 (Ambulates with assistance) - Balance while stepping forward/back - Complete Level 5 (Ambulates independently) - Balance while walking independently - Complete    Row Name 09/18/24 2330           Does the patient have exclusion criteria? No- Perform mobility assessment       What is the highest level of mobility based on the mobility assessment? Level 5 (Ambulates independently) - Balance while walking independently - Complete          Diet: Diet Orders (From admission, onward)     Start     Ordered   09/20/24 1832  Diet NPO time specified Except for: Ice Chips  Diet effective now       Question:  Except for  Answer:  Ice Chips   09/20/24 1831            Barriers to discharge: none Disposition Plan:  Home  HH orders placed: n/a Status is: Inpt  Objective: Blood pressure (!) 154/97, pulse 98, temperature 98 F (36.7 C), temperature source Oral, resp. rate 16, height 5' 4 (1.626 m), weight 99.8 kg, last menstrual period 07/06/2019, SpO2 100%.  Examination:  Physical Exam  Constitutional:      Appearance: Normal appearance.  HENT:     Head: Normocephalic and atraumatic.     Mouth/Throat:     Mouth: Mucous membranes are moist.  Eyes:     Extraocular Movements: Extraocular movements intact.  Cardiovascular:     Rate and Rhythm: Normal rate and regular rhythm.  Pulmonary:     Effort: Pulmonary effort is normal. No respiratory distress.     Breath sounds: Normal breath sounds. No wheezing.  Abdominal:      General: Bowel sounds are decreased. There is no distension.     Palpations: Abdomen is soft.     Tenderness: There is no abdominal tenderness.     Comments: Decreased BS in lower quadrants  Genitourinary:    Cervix: No cervical motion tenderness.  Musculoskeletal:        General: Normal range of motion.     Cervical back: Normal range of motion and neck supple.  Skin:    General: Skin is warm and dry.  Neurological:     General: No focal deficit present.     Mental Status: She is alert.  Psychiatric:        Mood and Affect: Mood normal.      Consultants:  General surgery  Procedures:  12/2: Ex-lap with partial SBR and LOA  Data Reviewed: Results for orders placed or performed during the hospital encounter of 09/15/24 (from the past 24 hours)  Basic metabolic panel with GFR     Status: Abnormal   Collection Time: 09/21/24  5:00 AM  Result Value Ref Range   Sodium 137 135 - 145 mmol/L   Potassium 4.4 3.5 - 5.1 mmol/L   Chloride 103 98 - 111 mmol/L   CO2 27 22 - 32 mmol/L   Glucose, Bld 119 (H) 70 - 99 mg/dL   BUN 14 6 - 20 mg/dL   Creatinine, Ser 8.79 (H) 0.44 - 1.00 mg/dL   Calcium 9.0 8.9 - 89.6 mg/dL   GFR, Estimated >39 >39 mL/min   Anion gap 7 5 - 15  CBC with Differential/Platelet     Status: Abnormal   Collection Time: 09/21/24  5:00 AM  Result Value Ref Range   WBC 12.1 (H) 4.0 - 10.5 K/uL   RBC 5.24 (H) 3.87 - 5.11 MIL/uL   Hemoglobin 13.7 12.0 - 15.0 g/dL   HCT 56.0 63.9 - 53.9 %   MCV 83.8 80.0 - 100.0 fL   MCH 26.1 26.0 - 34.0 pg   MCHC 31.2 30.0 - 36.0 g/dL   RDW 85.7 88.4 - 84.4 %   Platelets 385 150 - 400 K/uL   nRBC 0.0 0.0 - 0.2 %   Neutrophils Relative % 89 %   Neutro Abs 10.6 (H) 1.7 - 7.7 K/uL   Lymphocytes Relative 5 %   Lymphs Abs 0.7 0.7 - 4.0 K/uL   Monocytes Relative 6 %   Monocytes Absolute 0.8 0.1 - 1.0 K/uL   Eosinophils Relative 0 %   Eosinophils Absolute 0.0 0.0 - 0.5 K/uL   Basophils Relative 0 %   Basophils Absolute 0.0 0.0  - 0.1 K/uL   Immature Granulocytes 0 %   Abs Immature Granulocytes 0.05 0.00 - 0.07 K/uL  Magnesium      Status: None   Collection Time: 09/21/24  5:00 AM  Result Value Ref Range   Magnesium  2.1 1.7 - 2.4 mg/dL    I have reviewed pertinent nursing notes, vitals, labs, and images as necessary. I  have ordered labwork to follow up on as indicated.  I have reviewed the last notes from staff over past 24 hours. I have discussed patient's care plan and test results with nursing staff, CM/SW, and other staff as appropriate.  Old records reviewed in assessment of this patient  Time spent: Greater than 50% of the 55 minute visit was spent in counseling/coordination of care for the patient as laid out in the A&P.   LOS: 6 days   Alm Apo, MD [facility] 09/21/2024, 5:07 PM

## 2024-09-21 NOTE — Progress Notes (Signed)
 This morning during shift change at approximately 0720 patient called the front desk stating My tube came out and I put it back in but I want someone to come look and make sure it's right. Night shift nurse and reporting nurse looked at the marking of this patient's NG tube and it was still intact and in the same position as when it was inserted with 400 ml of drainage in suction canister. Later, the patient explained it came undone at the suction port not pulled out like it was told to us  at shift change and that she connected it at the suction port herself. PA made aware.

## 2024-09-21 NOTE — Progress Notes (Signed)
 Order received to discontinue foley catheter, foley catheter removed, tolerated well with 100 ml output. Bed in lowest position and call light within reach.

## 2024-09-21 NOTE — Anesthesia Postprocedure Evaluation (Signed)
 Anesthesia Post Note  Patient: Gwendolyn Nguyen  Procedure(s) Performed: LAPAROTOMY, EXPLORATORY WITH SMALL BOWEL RESECTION (Abdomen) LYSIS OF ADHESIONS (Abdomen)     Patient location during evaluation: PACU Anesthesia Type: General Level of consciousness: awake and alert Pain management: pain level controlled Vital Signs Assessment: post-procedure vital signs reviewed and stable Respiratory status: spontaneous breathing, nonlabored ventilation, respiratory function stable and patient connected to nasal cannula oxygen Cardiovascular status: blood pressure returned to baseline and stable Postop Assessment: no apparent nausea or vomiting Anesthetic complications: no   There were no known notable events for this encounter.  Last Vitals:    Last Pain:                 Garnette FORBES Skillern

## 2024-09-21 NOTE — Plan of Care (Signed)
   Problem: Education: Goal: Knowledge of General Education information will improve Description Including pain rating scale, medication(s)/side effects and non-pharmacologic comfort measures Outcome: Progressing

## 2024-09-21 NOTE — Progress Notes (Signed)
 1 Day Post-Op   Subjective/Chief Complaint: NGT was hooked up to sump port all night.  No flatus.  Pain seems well controlled.     Objective: Vital signs in last 24 hours: Temp:  [97.5 F (36.4 C)-98.7 F (37.1 C)] 98 F (36.7 C) (12/03 1018) Pulse Rate:  [82-98] 98 (12/03 1018) Resp:  [11-18] 16 (12/03 1018) BP: (122-154)/(84-107) 154/97 (12/03 1018) SpO2:  [90 %-100 %] 100 % (12/03 1018) Weight:  [99.8 kg] 99.8 kg (12/02 1324) Last BM Date : 09/18/24  Intake/Output from previous day: 12/02 0701 - 12/03 0700 In: 1711.8 [I.V.:1411.8; IV Piggyback:300] Out: 850 [Urine:750; Blood:100] Intake/Output this shift: Total I/O In: -  Out: 600 [Urine:200; Emesis/NG output:400]  Abdomen: some distention, appropriately tender, midline incision c/d/I with staples and honeycomb dressing.  NGT correctly hooked up and readvanced after being pulled out some overnight .  Lab Results:  Recent Labs    09/19/24 0527 09/21/24 0500  WBC 7.3 12.1*  HGB 14.9 13.7  HCT 47.0* 43.9  PLT 323 385   BMET Recent Labs    09/19/24 0527 09/21/24 0500  NA 136 137  K 4.0 4.4  CL 96* 103  CO2 30 27  GLUCOSE 115* 119*  BUN 12 14  CREATININE 1.15* 1.20*  CALCIUM 9.7 9.0   PT/INR No results for input(s): LABPROT, INR in the last 72 hours. ABG No results for input(s): PHART, HCO3 in the last 72 hours.  Invalid input(s): PCO2, PO2  Studies/Results: DG Abd Portable 1V Result Date: 09/20/2024 EXAM: 1 VIEW XRAY OF THE ABDOMEN 09/20/2024 05:57:03 AM COMPARISON: Comparison yesterday, 09/19/2024. CLINICAL HISTORY: SBO (small bowel obstruction) (HCC). FINDINGS: BOWEL: Stable small bowel dilatation concerning for ileus or distal small bowel obstruction. Contrast noted in nondilated colon. SOFT TISSUES: No opaque urinary calculi. BONES: No acute osseous abnormality. IMPRESSION: 1. Stable small bowel dilatation concerning for ileus or distal small bowel obstruction. 2. Contrast present within a  nondilated colon. Electronically signed by: Lynwood Seip MD 09/20/2024 08:02 AM EST RP Workstation: [id number]   DG Abd Portable 1V Result Date: 09/19/2024 CLINICAL DATA:  Abdominal pain EXAM: PORTABLE ABDOMEN - 1 VIEW COMPARISON:  Abdominal x-ray 09/19/2024 FINDINGS: There are dilated air-filled small bowel loops in the central and left abdomen measuring up to 4.5 cm similar to the prior examination. Contrast is seen throughout nondilated colon, also unchanged. No evidence for free air on this supine view. Lung bases are clear. No suspicious calcifications are identified. There surgical clips in the mid abdomen. IMPRESSION: Stable partial small bowel obstruction. Electronically Signed   By: Greig Pique M.D.   On: 09/19/2024 22:25    Anti-infectives: Anti-infectives (From admission, onward)    Start     Dose/Rate Route Frequency Ordered Stop   09/20/24 1100  cefoTEtan (CEFOTAN) 2 g in sodium chloride  0.9 % 100 mL IVPB        2 g 200 mL/hr over 30 Minutes Intravenous On call to O.R. 09/20/24 0901 09/20/24 1415       Assessment/Plan: POD 1, s/p ex lap with LOA and SBR by Dr. Vanderbilt 12/2 for SBO in setting of complex PSH -continue NGT and await ileus and bowel function -ambulate -pulm toilet -DC foley today -labs reviewed and are stable   FEN - NPO/NGT/IVFs per primary VTE - Lovenox  ID - cefotetan on call     LOS: 6 days   Burnard FORBES Banter, PA-C  09/21/2024

## 2024-09-21 NOTE — Progress Notes (Signed)
 Initial Nutrition Assessment  DOCUMENTATION CODES:   Obesity unspecified  INTERVENTION:  Recommend TPN within 24-48 hours as pt has been NPO/CLD x 6 days, If TPN imitated: TPN per pharmacy Estimated Nutritional Needs:  Kcal:  2100-2300 kcal Protein: 120-140 gm Fluid:  >2L/day Titrate TPN up to goal rate as pt at risk of refeeding  Add MVI  to TPN  Add 100 mg IV Thiamine  Monitor magnesium , potassium, and phosphorus daily for at least 4 days, MD to replete as needed, as pt is at risk for refeeding syndrome Continue TPN until pt tolerating a solid food diet and meeting >60% estimated needs by po Monitor for diet advancement, add ONS as able: CLD: Boost Breeze po TID, each supplement provides 250 kcal and 9 grams of protein FLD and above: Ensure Plus High Protein po BID, each supplement provides 350 kcal and 20 grams of protein   *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   NUTRITION DIAGNOSIS:   Increased nutrient needs related to acute illness as evidenced by estimated needs.  GOAL:   Patient will meet greater than or equal to 90% of their needs  MONITOR:   Diet advancement, Labs, Weight trends, I & O's  REASON FOR ASSESSMENT:   NPO/Clear Liquid Diet    ASSESSMENT:  38 y.o. 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 Found to have SBO.  12/2 - s/p ex lap, partial SBR and LOA, NGT placed   Pt reports having a great appetite and po intake PTA. Started taking a weight loss medication 3 months ago but states she has not lost any weight. UBW 220 lbs. Pt started having abdominal pain, nausea, and vomiting 7 days ago on 11/26. Pt treated conservatively with nonoperative management for SBO however  has failed medical management and underwent ex lap with SBR and LOA yesterday. Has been NPO/CLD for 6 days. Has not been able to tolerate clears and experienced nausea/vomiting. Recommend starting TPN within 24-48 hours as pt has been NPO/CLD x 7 days and may have ileus post op. Discussed with surgery.    Had NGT placed yesterday, NGT fixed today with copious amounts in  container. Pt states she had abdominal pain earlier but now feels better, no N/V today.   Weight on admission appears stated, recommend updated standing weight if possible.  Typical Dietary recall prior to onset of symptoms: Breakfast: fruit yogurt with granola Lunch: Sandwich Dinner: Typically snacks like fruit or yogurt Drinks: water, juice and Gatorade on weekends Alcohol: Only on special occasions   Admit weight: 99.8 kg Current weight: 99.8 kg  UBW: 220 lbs      Wt Readings from Last 10 Encounters:  09/20/24 99.8 kg  08/18/24 94.9 kg  07/06/24 97.5 kg  05/18/24 97.5 kg  04/20/24 97.3 kg  01/22/24 101.7 kg  11/25/22 95.7 kg  10/21/22 93.5 kg  10/02/22 93.9 kg  09/10/22 93.4 kg    Average Meal Intake: NPO  Nutritionally Relevant Medications: Scheduled Meds:  Chlorhexidine  Gluconate Cloth  6 each Topical Daily   enoxaparin  (LOVENOX ) injection  40 mg Subcutaneous Q24H   fluticasone  furoate-vilanterol  1 puff Inhalation Daily   methocarbamol  (ROBAXIN ) injection  500 mg Intravenous QID   sodium chloride  flush  3 mL Intravenous Q12H   Continuous Infusions:  sodium chloride  Stopped (09/20/24 1939)   Labs Reviewed: Creatinine 1.20  CBG ranges from 115-119 mg/dL over the last 24 hours HgbA1c 5.4  NUTRITION - FOCUSED PHYSICAL EXAM:  Flowsheet Row Most Recent Value  Orbital Region No depletion  Upper Arm Region No depletion  Thoracic and Lumbar Region No depletion  Buccal Region No depletion  Temple Region No depletion  Clavicle Bone Region No depletion  Clavicle and Acromion Bone Region No depletion   Scapular Bone Region No depletion  Dorsal Hand No depletion  Patellar Region No depletion  Anterior Thigh Region No depletion  Posterior Calf Region No depletion  Edema (RD Assessment) None  Hair Reviewed  Eyes Reviewed  Mouth Reviewed  Skin Reviewed  Nails Reviewed    Diet Order:   Diet Order             Diet NPO time specified Except for: Ice Chips  Diet effective now                   EDUCATION NEEDS:   Education needs have been addressed  Skin:  Skin Assessment: Skin Integrity Issues: Skin Integrity Issues:: Incisions Incisions: Closed surgical incision abdomen  Last BM:  11/30, type 7  Height:   Ht Readings from Last 1 Encounters:  09/20/24 5' 4 (1.626 m)    Weight:   Wt Readings from Last 1 Encounters:  09/20/24 99.8 kg    Ideal Body Weight:  54.5 kg  BMI:  Body mass index is 37.76 kg/m.  Estimated Nutritional Needs:   Kcal:  2100-2300 kcal  Protein:  120-140 gm  Fluid:  >2L/day   Gwendolyn Nguyen, RD Registered Dietitian  See Amion for more information

## 2024-09-22 ENCOUNTER — Ambulatory Visit

## 2024-09-22 DIAGNOSIS — Z5329 Procedure and treatment not carried out because of patient's decision for other reasons: Secondary | ICD-10-CM

## 2024-09-22 DIAGNOSIS — K56609 Unspecified intestinal obstruction, unspecified as to partial versus complete obstruction: Secondary | ICD-10-CM | POA: Diagnosis not present

## 2024-09-22 LAB — SURGICAL PATHOLOGY

## 2024-09-22 NOTE — Progress Notes (Addendum)
 Patient was transferred to 5N29 from 6N with sister who came from the ED.CN and primary nurse were about to enter the room when patient told to these RN, I don't need you!.Minutes later after arriving to the room, pt's sister asked if pt can be discharged at this hour so she can just take her home.CN explained the process of discharging a patient vs signing an AMA form. Patient and sister were educated about the risk of leaving AMA. Sister was agreeable to sign the form but pt eventually signed AMA. Pt at first refused to remove the IV and didn't want to be touched but later let this nurse remove the IV.  0342am: Patient left the unit with sister.

## 2024-09-22 NOTE — Progress Notes (Signed)
 Received the patient with NG not connected to suction. Checked for the result of NG confirmation on the results review tab but with not results seen. Referred to Lynwood Kipper NP and found the confirmation via Surgery notes about the confirmation and connected the NG tube to the suction as ordered. Charge nurse aware.

## 2024-09-22 NOTE — Progress Notes (Signed)
 Patient is complaining that she is not updated with the procedures done with her. Referred to Lynwood Kipper, NP and advised that surgery is the one to update the patient. Informed Charge nurse about the complaint.

## 2024-09-22 NOTE — Progress Notes (Signed)
 Pt called and stated there is something wrong with her NG-tube.This RN went to the pt room and pt stated the NG-tube is not in place, NG-tube was still in place. On call provider notified. Pt then called and requested Ice ships and pain medication. Went to pt room to give her the pain medication, I noticed that pt removed her NG- tube.Pt then told this RN that she reason she removed the NG tube because she does not want to wait for 30 minutes and get the NG-tube obstruct her breathing. This nurse went  and asked the Receptionist if the pt called about difficulty breathing, the receptionist said no, pt only asked for ice chips and pain medication. On call provider notified  that the pt removed her NG-tube. Per on call provider's order to ask the pt if she is agreeable, to reinsert the NG tube. Pt was agreeable to get another NG-tube, This RN and charge nurse went to insert the NG tube.  Pt requested to speak to to the on call provider.

## 2024-09-22 NOTE — Progress Notes (Signed)
    Against Medical Advice  Earlier tonight, patient had pulled out her NG tube and alerted staff she wanted to leave AGAINST MEDICAL ADVICE if she was not transferred out of 6N unit.    I spoke with her over the phone regarding this decision.   The patient is alert/ oriented x3 and is capable of making her own medical decisions. She verbalized understanding that she requires hospitalization for treatment of her SBO and that leaving AMA could result in irreversible bowel damage, disability, or death.  Although she was agreeable to have NG tube reinserted, the patient remained dissatisfied with her care and repeatedly insisted on an immediate transfer to another unit. In an effort to maintain her hospitalization and address her concerns, the house supervisor was contacted, and arrangements were made to transfer her to 5N.  Shortly after arriving to 5N, I was notified that patient had left AGAINST MEDICAL ADVICE. As the patient had already departed, I was unable to further discuss the risks associated with leaving AMA.  Patient signed AMA form, education provided by nursing staff.   Aaradhya Kysar, DNP, ACNPC- AG [facility] [place]

## 2024-09-22 NOTE — Progress Notes (Signed)
 Patient complains of itchiness and Informed Lynwood Kipper, NP and a order given.

## 2024-09-22 NOTE — Progress Notes (Signed)
 Patient received from 6 N, came walking along the 2 RNs. Patient in phone trying to go down to ED to receive her sister, she was told that the security will be helping her sister up to 5N. Patient was restless refusing to get in the room to get her vitals. After the sister arrives, sister asked to come back after sometimes to get her vitals and to provide some private time. The sister later comes out and asked the options to get her discharged at this time. They were made aware of AMA and the process as well as consequences. The sister mention this nurse that if she doesn't take her home tonight she will create problem for every body so its its better to take her home and proceeded with AMA.

## 2024-09-22 NOTE — Final Progress Note (Signed)
 0130 Was told by pt's primary RN that pt's NGTube was out and that she complained earlier that it bothers her and thought it is sliding out. RN informed the on call NP and was waiting for further order. Per nurse sec. The last time pt called she asked for ice chips and pain med. But did not mentioned that she pulled her NG tube out. Rn found that it was out when she came in and gave her pain med. Pt said that it bothers her breathing that's why she pulled it out. On call NP was notified and ordered to place ir back in. When this RN and primary RN went inside the room pt said that she would like to be transferred to a different unit or else she will go home. Pt. Verbalizes  I'm not comfortable being in this unit, I have concern about the care I received. Have called pt advocate yesterday but nobody picked up. I think because it's after hours. Asked the pt. If she can tell me about her concerns so I can let leadership know and she refused. [place]  you work here and I know you will be bias w/ your opinion, I would like to talk to a third party/MD. NP on called was notified about pt request for transfer. NP called and talked to the pt and transfer order was placed. Per NP she will call [facility] to facilitate faster transfer of pt. Transferred pt to 5N29 and report was given by primary RN to [employer] nurse.

## 2024-09-22 NOTE — Progress Notes (Signed)
 Patient stated the NG tube is placed properly this time. Initial output acquired approx 20cc. NGT insertion at level 55.

## 2024-09-23 NOTE — Discharge Summary (Signed)
 Physician Discharge Summary   Gwendolyn Nguyen FMW:[id number] DOB: 10-28-1985 DOA: 09/15/2024  PCP: Jerome Heron Ruth, PA-C  Admit date: 09/15/2024 Discharge date: 09/22/2024 LEFT AMA  Admitted From: Home Disposition:  Home Discharging physician: Alm Apo, MD  [facility]: 38 y.o. female with medical history significant for depression, fibromyalgia, hypertension, urticaria, reactive airways disease, history of abdominal hysterectomy with bilateral salpingo-oophorectomy, and laparoscopic lysis of adhesions in 2020 who presented with abdominal pain, nausea, and vomiting.  CT of the abdomen and pelvis reveals SBO.   Unfortunately, left AMA very early morning of 12/4 despite attempts of staff to convince patient to stay.    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  Monitor output   Asthma  - Not in exacerbation   - Continue ICS-LABA and as-needed SABA     Hypertension -No history of hypertension and not taking any medications at home -May be related to stress due to small bowel obstruction -Treat as-needed only for now with hydralazine  and labetalol    Principal Diagnosis: SBO (small bowel obstruction) (HCC)  Discharge Diagnoses: Active Hospital Problems   Diagnosis Date Noted   SBO (small bowel obstruction) (HCC) 09/15/2024    Priority: 1.   Essential hypertension 09/15/2024   Chronic asthma without complication 09/15/2024    Resolved Hospital Problems  No resolved problems  to display.      Allergies as of 09/22/2024       Reactions   Bovine (beef) Protein-containing Drug Products Hives, Itching   Gadolinium Derivatives Hives, Itching, Nausea And Vomiting   Pt vomited immediately during injection.  15 minutes later, pt developed hives and itching.    Other Hives, Itching   Lamb   Dust Mite Extract Other (See Comments)    Unknown   Shellfish Allergy  Hives   Sulfa Antibiotics Other (See Comments)   Patient was told to NOT TAKE THIS   Sulfamethoxazole-trimethoprim Other (See Comments)   Other reaction(s): hives   Iohexol  Hives, Rash   08-2018, rash and hives, needs pre meds S/W PATIENT STATED REACTION FROM CT CONTRAST 11/19        Medication List     ASK your doctor about these medications    acetaminophen  500 MG tablet Commonly known as: TYLENOL  Take 1,000 mg by mouth daily as needed for mild pain (pain score 1-3) or moderate pain (pain score 4-6).   albuterol  108 (90 Base) MCG/ACT inhaler Commonly known as: VENTOLIN  HFA Can inhale 2 puffs every 4-6 hours as needed for cough/wheeze/shortness of breath/chest tightness.   amLODipine  5 MG tablet Commonly known as: NORVASC  Take 5 mg by mouth daily.   budesonide -formoterol  160-4.5 MCG/ACT inhaler Commonly known as: Symbicort  Inhale 2 puffs into the lungs 2 (two) times daily.   cyclobenzaprine  5 MG tablet Commonly known as: FLEXERIL  Take 10 mg by mouth at bedtime.   diphenhydrAMINE  50 MG tablet Commonly known as: BENADRYL  Pt to take 50 mg of prednisone  on 09/24/23 at 9:00 pm, 50 mg of prednisone  on 09/25/23 at 3:00 am, and 50 mg  of prednisone  on 09/25/23 at 9:00 am. Pt is also to take 50 mg of benadryl  on 09/25/23 at 9:00 am. Please call [phone] with any questions.   EPINEPHrine  0.3 mg/0.3 mL Soaj injection Commonly known as: EPI-PEN Inject 0.3 mg into the muscle as needed for anaphylaxis. Ask about: Which instructions should I use?   Neffy  2 MG/0.1ML Soln Generic drug:  EPINEPHrine  Place 1 spray into the nose as needed (Anaphylaxis). Ask about: Which instructions should I use?   estradiol 0.01 % Crea vaginal cream Commonly known as: ESTRACE Place 1 g vaginally.   estradiol 0.025 MG/24HR Commonly known as: VIVELLE-DOT Place 1 patch onto the skin 2 (two) times a week.   famotidine  20 MG tablet Commonly known as: Pepcid  Take 1 tablet (20 mg total) by mouth 2 (two) times daily.   fluconazole  150 MG tablet Commonly known as: Diflucan  Take one tablet by mouth at the onset of symptoms and repeat in 5 days Ask about: Which instructions should I use?   HYDROmorphone  2 MG tablet Commonly known as: DILAUDID  Take 2 mg by mouth in the morning and at bedtime.   montelukast  10 MG tablet Commonly known as: Singulair  Take 1 tablet (10 mg total) by mouth at bedtime.   Movantik 12.5 MG Tabs tablet Generic drug: naloxegol oxalate Take 12.5 mg by mouth daily.   phentermine  15 MG capsule Take 15 mg by mouth daily.   progesterone 100 MG capsule Commonly known as: PROMETRIUM Take 100 mg by mouth daily.   Savella Titration Pack 12.5 & 25 & 50 MG Misc Generic drug: Milnacipran HCl Take 25 mg by mouth in the morning and at bedtime.   valACYclovir  1000 MG tablet Commonly known as: Valtrex  Take 1 tablet twice a day x 3 days for cold sores outbreaks   Xolair  300 MG/2ML injection Generic drug: omalizumab  Inject into the skin.        Allergies  Allergen Reactions   Bovine (Beef) Protein-Containing Drug Products Hives and Itching   Gadolinium Derivatives Hives, Itching and Nausea And Vomiting    Pt vomited immediately during injection.  15 minutes later, pt developed hives and itching.    Other Hives and Itching    Lamb   Dust Mite Extract Other (See Comments)     Unknown   Shellfish Allergy  Hives   Sulfa Antibiotics Other (See Comments)    Patient was told to NOT TAKE THIS   Sulfamethoxazole-Trimethoprim Other (See Comments)    Other  reaction(s): hives   Iohexol  Hives and Rash    08-2018, rash and hives, needs pre meds S/W PATIENT STATED REACTION FROM CT CONTRAST 11/19    Discharge Exam: BP (!) 141/93 (BP Location: Right Arm)   Pulse 97   Temp 98.3 F (36.8 C) (Oral)   Resp 20   Ht 5' 4 (1.626 m)   Wt 99.8 kg   LMP 07/06/2019 (Approximate)   SpO2 99%   BMI 37.76 kg/m    The results of significant diagnostics from this hospitalization (including imaging, microbiology, ancillary and laboratory) are listed below for reference.   Microbiology: No results found for this or any previous visit (from the past 240 hours).   Labs: BNP (last 3 results) No results for input(s): BNP in the last 8760 hours. Basic Metabolic Panel: Recent Labs  Lab 09/17/24 0111 09/18/24 0056 09/19/24 0527 09/21/24 0500  NA 137 137 136 137  K 3.9 4.1 4.0 4.4  CL 100 97* 96* 103  CO2 29 28 30  27  GLUCOSE 89 107* 115* 119*  BUN 14 12 12 14   CREATININE 1.24* 1.29* 1.15* 1.20*  CALCIUM 8.9 9.1 9.7 9.0  MG  --  2.2  --  2.1   Liver Function Tests: No results for input(s): AST, ALT, ALKPHOS, BILITOT, PROT, ALBUMIN  in the last 168 hours. No results for input(s): LIPASE, AMYLASE in the last 168 hours. No results for input(s): AMMONIA in the last 168 hours. CBC: Recent Labs  Lab 09/17/24 0111 09/18/24 0056 09/19/24 0527 09/21/24 0500  WBC 9.2 9.5 7.3 12.1*  NEUTROABS  --   --   --  10.6*  HGB 12.9 13.0 14.9 13.7  HCT 41.2 41.7 47.0* 43.9  MCV 83.7 83.6 82.7 83.8  PLT 300 298 323 385   Cardiac Enzymes: No results for input(s): CKTOTAL, CKMB, CKMBINDEX, TROPONINI in the last 168 hours. BNP: Invalid input(s): POCBNP CBG: No results for input(s): GLUCAP in the last 168 hours. D-Dimer No results for input(s): DDIMER in the last 72 hours. Hgb A1c No results for input(s): HGBA1C in the last 72 hours. Lipid Profile No results for input(s): CHOL, HDL, LDLCALC, TRIG, CHOLHDL,  LDLDIRECT in the last 72 hours. Thyroid  function studies No results for input(s): TSH, T4TOTAL, T3FREE, THYROIDAB in the last 72 hours.  Invalid input(s): FREET3 Anemia work up No results for input(s): VITAMINB12, FOLATE, FERRITIN, TIBC, IRON , RETICCTPCT in the last 72 hours. Urinalysis    Component Value Date/Time   COLORURINE YELLOW 09/16/2024 0500   APPEARANCEUR CLEAR 09/16/2024 0500   LABSPEC 1.035 (H) 09/16/2024 0500   PHURINE 6.0 09/16/2024 0500   GLUCOSEU NEGATIVE 09/16/2024 0500   HGBUR NEGATIVE 09/16/2024 0500   BILIRUBINUR NEGATIVE 09/16/2024 0500   BILIRUBINUR small (A) 07/06/2024 1613   BILIRUBINUR negative 07/22/2022 1728   KETONESUR NEGATIVE 09/16/2024 0500   PROTEINUR NEGATIVE 09/16/2024 0500   UROBILINOGEN 0.2 07/06/2024 1613   UROBILINOGEN 0.2 07/30/2018 1859   NITRITE NEGATIVE 09/16/2024 0500   LEUKOCYTESUR NEGATIVE 09/16/2024 0500   Sepsis Labs Recent Labs  Lab 09/17/24 0111 09/18/24 0056 09/19/24 0527 09/21/24 0500  WBC 9.2 9.5 7.3 12.1*   Microbiology No results found for this or any previous visit (from the past 240 hours).  Procedures/Studies: DG Abd Portable 1V Result Date: 09/20/2024 EXAM: 1 VIEW XRAY OF THE ABDOMEN 09/20/2024 05:57:03 AM COMPARISON: Comparison yesterday, 09/19/2024. CLINICAL HISTORY: SBO (small bowel obstruction) (HCC). FINDINGS: BOWEL: Stable small bowel dilatation concerning for ileus or distal small bowel obstruction. Contrast noted in nondilated colon. SOFT TISSUES: No opaque urinary calculi. BONES: No acute osseous abnormality. IMPRESSION: 1. Stable small bowel dilatation concerning for ileus or distal small bowel obstruction. 2. Contrast present within a nondilated colon. Electronically signed by: Lynwood Seip MD 09/20/2024 08:02 AM EST RP Workstation: [id number]   DG Abd Portable 1V Result Date: 09/19/2024 CLINICAL DATA:  Abdominal pain EXAM: PORTABLE ABDOMEN - 1 VIEW COMPARISON:  Abdominal x-ray  09/19/2024 FINDINGS: There are dilated air-filled small bowel loops in the central and left abdomen measuring up to 4.5 cm similar to the prior examination. Contrast is seen throughout nondilated colon, also unchanged. No evidence for free air on this supine view. Lung bases are clear. No suspicious calcifications are identified. There surgical clips in the mid abdomen. IMPRESSION: Stable partial small bowel obstruction. Electronically Signed   By: Greig Pique M.D.   On: 09/19/2024 22:25   DG Abd 1 View Result Date: 09/19/2024 CLINICAL DATA:  Follow-up small bowel obstruction. EXAM: ABDOMEN - 1 VIEW COMPARISON:  09/17/2024.  Abdomen and pelvis CT dated 09/15/2024. FINDINGS: No significant change in multiple dilated small bowel loops. Oral contrast throughout normal caliber colon and rectum. Left mid to lower abdominal and right pelvic surgical clips. Unremarkable bones. IMPRESSION: Stable partial small bowel obstruction. Electronically Signed   By: Elspeth Bathe M.D.   On: 09/19/2024 15:51   DG Abd Portable 1V-Small Bowel Obstruction Protocol-initial, 8 hr delay Result Date: 09/17/2024 EXAM: 1 VIEW XRAY OF THE ABDOMEN 09/17/2024 08:06:00 PM COMPARISON: 09/17/2024 CLINICAL HISTORY: FINDINGS: BOWEL: Persistent dilation of the small bowel. Enteric contrast is present throughout the colon. SOFT TISSUES: Surgical clips and enteric suture lines in left lower quadrant. Postsurgical changes in left lower quadrant. No opaque urinary calculi. BONES: No acute osseous abnormality. IMPRESSION: 1. Persistent evaluation of the small bowel with passage of enteric contrast into the colon. Findings are compatible with ileus. Electronically signed by: Norman Gatlin MD 09/17/2024 08:25 PM EST RP Workstation: [id number]   DG Abd Portable 1V Result Date: 09/17/2024 EXAM: 1 VIEW XRAY OF THE ABDOMEN 09/17/2024 04:33:00 AM COMPARISON: CT with iv contrast 09/15/2024. CLINICAL HISTORY: [id number] SBO (small bowel obstruction) (HCC)  [id number] SBO (small bowel obstruction) (HCC) FINDINGS: BOWEL: Dilated small bowel segments in the left hemiabdomen measure up to 4.3 cm with minimal if any change. Colonic gas is still seen at least as far as the sigmoid colon, with very little small bowel aeration on the right. SOFT TISSUES: No opaque urinary calculi. There are postsurgical changes in the left lower quadrant and overlying the sacrum. BONES: No acute osseous abnormality. IMPRESSION: 1. Findings consistent with small bowel obstruction, with dilated small bowel segments in the left hemiabdomen measuring up to 4.3 cm and minimal to no interval change. 2. Colonic gas is present to at least the sigmoid colon, with very little small bowel aeration on the right. Electronically signed by: Francis Quam MD 09/17/2024 05:45 AM EST RP Workstation: [id number]   CT ABDOMEN PELVIS W CONTRAST Result Date: 09/15/2024 EXAM: CT ABDOMEN AND PELVIS WITH CONTRAST 09/15/2024 09:02:40 PM TECHNIQUE: CT of the abdomen and pelvis was performed with the administration of 75 mL of iohexol  (OMNIPAQUE ) 350 MG/ML injection. Multiplanar reformatted images are provided for review. Automated exposure control, iterative reconstruction, and/or weight-based adjustment of the mA/kV was utilized to reduce the radiation dose to as low as reasonably achievable. COMPARISON: None available. CLINICAL HISTORY: evaluate intraabdominal pathology, we are giving pretreatment FINDINGS: LOWER CHEST: No acute abnormality. LIVER: The liver is unremarkable. GALLBLADDER AND BILE DUCTS: Gallbladder is unremarkable. No biliary ductal dilatation. SPLEEN: No acute abnormality. PANCREAS: No acute abnormality. ADRENAL GLANDS: No acute abnormality. KIDNEYS, URETERS AND BLADDER: No stones in the kidneys or ureters. No hydronephrosis. No perinephric or periureteral stranding. Urinary bladder is unremarkable. GI AND BOWEL: Stomach demonstrates no acute abnormality. Dilated proximal and mid small bowel loops.  Mid to distal small bowel decompressed. Findings compatible with mid small bowel obstruction. Exact transition and cause not visualized. PERITONEUM AND RETROPERITONEUM: Trace perihepatic ascites. No free air. VASCULATURE: Aorta is normal in caliber. LYMPH NODES: No lymphadenopathy. REPRODUCTIVE ORGANS: Prior hysterectomy. No adnexal mass. BONES AND SOFT TISSUES: No acute osseous abnormality. No focal soft tissue abnormality. IMPRESSION: 1. Mid small bowel obstruction; exact transition and cause not visualized. 2. Trace perihepatic ascites. Electronically signed by: Franky Crease MD 09/15/2024 09:14 PM EST RP Workstation: [id number]   CT Angio Chest PE W and/or Wo Contrast Result Date: 09/15/2024 EXAM: CTA CHEST 09/15/2024 09:02:40 PM TECHNIQUE: CTA of the chest was performed  after the administration of 75 mL of iohexol  (OMNIPAQUE ) 350 MG/ML injection. Multiplanar reformatted images are provided for review. MIP images are provided for review. Automated exposure control, iterative reconstruction, and/or weight based adjustment of the mA/kV was utilized to reduce the radiation dose to as low as reasonably achievable. COMPARISON: 06/24/2019. CLINICAL HISTORY: evaluate for pe evaluate for pe FINDINGS: PULMONARY ARTERIES: Pulmonary arteries are adequately opacified for evaluation. No acute pulmonary embolus. Main pulmonary artery is normal in caliber. MEDIASTINUM: The heart and pericardium demonstrate no acute abnormality. There is no acute abnormality of the thoracic aorta. LYMPH NODES: No mediastinal, hilar or axillary lymphadenopathy. LUNGS AND PLEURA: Right base atelectasis. No focal consolidation or pulmonary edema. No evidence of pleural effusion or pneumothorax. UPPER ABDOMEN: Limited images of the upper abdomen are unremarkable. SOFT TISSUES AND BONES: No acute bone or soft tissue abnormality. IMPRESSION: 1. No pulmonary embolism. 2. No acute cardiopulmonary disease. Electronically signed by: Franky Crease MD  09/15/2024 09:10 PM EST RP Workstation: [id number]     Time coordinating discharge: Over 30 minutes    Alm Apo, MD  [facility] 09/23/2024, 4:33 PM

## 2024-10-04 ENCOUNTER — Ambulatory Visit

## 2024-10-10 ENCOUNTER — Ambulatory Visit (INDEPENDENT_AMBULATORY_CARE_PROVIDER_SITE_OTHER)

## 2024-10-10 DIAGNOSIS — L501 Idiopathic urticaria: Secondary | ICD-10-CM | POA: Diagnosis not present

## 2024-11-07 ENCOUNTER — Ambulatory Visit

## 2024-11-11 ENCOUNTER — Ambulatory Visit

## 2024-11-11 DIAGNOSIS — L501 Idiopathic urticaria: Secondary | ICD-10-CM

## 2024-12-09 ENCOUNTER — Ambulatory Visit

## 2024-12-22 ENCOUNTER — Ambulatory Visit: Admitting: Allergy
